# Patient Record
Sex: Male | Born: 2000 | Hispanic: Yes | Marital: Single | Smoking: Former smoker
Health system: Southern US, Community
[De-identification: ages and names within clinical notes are randomized; demographics above are authoritative.]

---
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
## 2021-08-05 ENCOUNTER — Inpatient Hospital Stay (HOSPITAL_COMMUNITY)
Admission: EM | Admit: 2021-08-05 | Discharge: 2021-09-19 | DRG: 003 | Disposition: A | Discharge status: Rehabilitation Facility | Payer: Medicaid Other | Attending: Internal Medicine | Admitting: Internal Medicine

## 2021-08-05 ENCOUNTER — Emergency Department (HOSPITAL_COMMUNITY): Payer: Medicaid Other | Admitting: Anesthesiology

## 2021-08-05 ENCOUNTER — Emergency Department (HOSPITAL_COMMUNITY): Payer: Medicaid Other

## 2021-08-05 ENCOUNTER — Inpatient Hospital Stay (HOSPITAL_COMMUNITY): Payer: Medicaid Other

## 2021-08-05 ENCOUNTER — Inpatient Hospital Stay (HOSPITAL_COMMUNITY)
Admission: EM | Disposition: A | Discharge status: Other Acute Inpatient Hospital | Payer: Self-pay | Source: Home / Self Care

## 2021-08-05 ENCOUNTER — Other Ambulatory Visit (HOSPITAL_COMMUNITY): Payer: Self-pay

## 2021-08-05 ENCOUNTER — Encounter (HOSPITAL_COMMUNITY)
Admission: EM | Disposition: A | Discharge status: Other Acute Inpatient Hospital | Payer: Self-pay | Source: Home / Self Care

## 2021-08-05 DIAGNOSIS — S27322A Contusion of lung, bilateral, initial encounter: Secondary | ICD-10-CM

## 2021-08-05 DIAGNOSIS — S299XXA Unspecified injury of thorax, initial encounter: Secondary | ICD-10-CM | POA: Diagnosis not present

## 2021-08-05 DIAGNOSIS — S73004A Unspecified dislocation of right hip, initial encounter: Secondary | ICD-10-CM

## 2021-08-05 DIAGNOSIS — S271XXA Traumatic hemothorax, initial encounter: Secondary | ICD-10-CM

## 2021-08-05 DIAGNOSIS — J9622 Acute and chronic respiratory failure with hypercapnia: Secondary | ICD-10-CM | POA: Diagnosis not present

## 2021-08-05 DIAGNOSIS — S62241B Displaced fracture of shaft of first metacarpal bone, right hand, initial encounter for open fracture: Secondary | ICD-10-CM

## 2021-08-05 DIAGNOSIS — J690 Pneumonitis due to inhalation of food and vomit: Secondary | ICD-10-CM

## 2021-08-05 DIAGNOSIS — S270XXA Traumatic pneumothorax, initial encounter: Secondary | ICD-10-CM

## 2021-08-05 DIAGNOSIS — J9601 Acute respiratory failure with hypoxia: Secondary | ICD-10-CM

## 2021-08-05 DIAGNOSIS — S225XXA Flail chest, initial encounter for closed fracture: Secondary | ICD-10-CM

## 2021-08-05 DIAGNOSIS — R451 Restlessness and agitation: Secondary | ICD-10-CM

## 2021-08-05 DIAGNOSIS — J80 Acute respiratory distress syndrome: Secondary | ICD-10-CM | POA: Diagnosis present

## 2021-08-05 DIAGNOSIS — S27329A Contusion of lung, unspecified, initial encounter: Secondary | ICD-10-CM | POA: Diagnosis not present

## 2021-08-05 DIAGNOSIS — S32401A Unspecified fracture of right acetabulum, initial encounter for closed fracture: Secondary | ICD-10-CM

## 2021-08-05 DIAGNOSIS — S6291XB Unspecified fracture of right wrist and hand, initial encounter for open fracture: Secondary | ICD-10-CM

## 2021-08-05 DIAGNOSIS — S42352A Displaced comminuted fracture of shaft of humerus, left arm, initial encounter for closed fracture: Secondary | ICD-10-CM

## 2021-08-05 DIAGNOSIS — S27321A Contusion of lung, unilateral, initial encounter: Secondary | ICD-10-CM | POA: Diagnosis present

## 2021-08-05 DIAGNOSIS — T1490XA Injury, unspecified, initial encounter: Principal | ICD-10-CM

## 2021-08-05 DIAGNOSIS — J9621 Acute and chronic respiratory failure with hypoxia: Secondary | ICD-10-CM | POA: Diagnosis present

## 2021-08-05 DIAGNOSIS — I824Y1 Acute embolism and thrombosis of unspecified deep veins of right proximal lower extremity: Secondary | ICD-10-CM

## 2021-08-05 DIAGNOSIS — S81801A Unspecified open wound, right lower leg, initial encounter: Secondary | ICD-10-CM

## 2021-08-05 DIAGNOSIS — S2242XA Multiple fractures of ribs, left side, initial encounter for closed fracture: Secondary | ICD-10-CM

## 2021-08-05 DIAGNOSIS — G9341 Metabolic encephalopathy: Secondary | ICD-10-CM

## 2021-08-05 DIAGNOSIS — I82629 Acute embolism and thrombosis of deep veins of unspecified upper extremity: Secondary | ICD-10-CM

## 2021-08-05 DIAGNOSIS — S42362A Displaced segmental fracture of shaft of humerus, left arm, initial encounter for closed fracture: Secondary | ICD-10-CM

## 2021-08-05 DIAGNOSIS — S32421A Displaced fracture of posterior wall of right acetabulum, initial encounter for closed fracture: Secondary | ICD-10-CM

## 2021-08-05 DIAGNOSIS — T07XXXA Unspecified multiple injuries, initial encounter: Secondary | ICD-10-CM | POA: Diagnosis present

## 2021-08-05 DIAGNOSIS — L8990 Pressure ulcer of unspecified site, unspecified stage: Secondary | ICD-10-CM | POA: Insufficient documentation

## 2021-08-05 DIAGNOSIS — S069XAA Unspecified intracranial injury with loss of consciousness status unknown, initial encounter: Secondary | ICD-10-CM | POA: Diagnosis present

## 2021-08-05 DIAGNOSIS — S52021A Displaced fracture of olecranon process without intraarticular extension of right ulna, initial encounter for closed fracture: Secondary | ICD-10-CM | POA: Diagnosis not present

## 2021-08-05 DIAGNOSIS — G5632 Lesion of radial nerve, left upper limb: Secondary | ICD-10-CM | POA: Diagnosis not present

## 2021-08-05 DIAGNOSIS — G928 Other toxic encephalopathy: Secondary | ICD-10-CM | POA: Diagnosis present

## 2021-08-05 DIAGNOSIS — R402112 Coma scale, eyes open, never, at arrival to emergency department: Secondary | ICD-10-CM | POA: Diagnosis present

## 2021-08-05 DIAGNOSIS — Z20822 Contact with and (suspected) exposure to covid-19: Secondary | ICD-10-CM | POA: Diagnosis present

## 2021-08-05 DIAGNOSIS — S52612A Displaced fracture of left ulna styloid process, initial encounter for closed fracture: Secondary | ICD-10-CM | POA: Diagnosis present

## 2021-08-05 DIAGNOSIS — T794XXA Traumatic shock, initial encounter: Secondary | ICD-10-CM | POA: Diagnosis present

## 2021-08-05 DIAGNOSIS — F05 Delirium due to known physiological condition: Secondary | ICD-10-CM | POA: Diagnosis not present

## 2021-08-05 DIAGNOSIS — G4701 Insomnia due to medical condition: Secondary | ICD-10-CM | POA: Diagnosis not present

## 2021-08-05 DIAGNOSIS — N170 Acute kidney failure with tubular necrosis: Secondary | ICD-10-CM | POA: Diagnosis present

## 2021-08-05 DIAGNOSIS — S42302A Unspecified fracture of shaft of humerus, left arm, initial encounter for closed fracture: Secondary | ICD-10-CM | POA: Diagnosis not present

## 2021-08-05 DIAGNOSIS — D62 Acute posthemorrhagic anemia: Secondary | ICD-10-CM | POA: Diagnosis not present

## 2021-08-05 DIAGNOSIS — S3600XA Unspecified injury of spleen, initial encounter: Secondary | ICD-10-CM | POA: Diagnosis present

## 2021-08-05 DIAGNOSIS — N179 Acute kidney failure, unspecified: Secondary | ICD-10-CM | POA: Diagnosis not present

## 2021-08-05 DIAGNOSIS — S065X9A Traumatic subdural hemorrhage with loss of consciousness of unspecified duration, initial encounter: Secondary | ICD-10-CM | POA: Diagnosis present

## 2021-08-05 DIAGNOSIS — T797XXA Traumatic subcutaneous emphysema, initial encounter: Secondary | ICD-10-CM | POA: Diagnosis present

## 2021-08-05 DIAGNOSIS — S299XXS Unspecified injury of thorax, sequela: Secondary | ICD-10-CM | POA: Diagnosis not present

## 2021-08-05 DIAGNOSIS — F17210 Nicotine dependence, cigarettes, uncomplicated: Secondary | ICD-10-CM | POA: Diagnosis present

## 2021-08-05 DIAGNOSIS — D65 Disseminated intravascular coagulation [defibrination syndrome]: Secondary | ICD-10-CM | POA: Diagnosis not present

## 2021-08-05 DIAGNOSIS — L89896 Pressure-induced deep tissue damage of other site: Secondary | ICD-10-CM | POA: Diagnosis not present

## 2021-08-05 DIAGNOSIS — N289 Disorder of kidney and ureter, unspecified: Secondary | ICD-10-CM | POA: Diagnosis not present

## 2021-08-05 DIAGNOSIS — J156 Pneumonia due to other aerobic Gram-negative bacteria: Secondary | ICD-10-CM | POA: Diagnosis not present

## 2021-08-05 DIAGNOSIS — S52022A Displaced fracture of olecranon process without intraarticular extension of left ulna, initial encounter for closed fracture: Secondary | ICD-10-CM | POA: Diagnosis present

## 2021-08-05 DIAGNOSIS — T8189XA Other complications of procedures, not elsewhere classified, initial encounter: Secondary | ICD-10-CM | POA: Diagnosis not present

## 2021-08-05 DIAGNOSIS — S42301A Unspecified fracture of shaft of humerus, right arm, initial encounter for closed fracture: Secondary | ICD-10-CM | POA: Diagnosis not present

## 2021-08-05 DIAGNOSIS — M7989 Other specified soft tissue disorders: Secondary | ICD-10-CM | POA: Diagnosis not present

## 2021-08-05 DIAGNOSIS — S71101D Unspecified open wound, right thigh, subsequent encounter: Secondary | ICD-10-CM | POA: Diagnosis not present

## 2021-08-05 DIAGNOSIS — Z23 Encounter for immunization: Secondary | ICD-10-CM

## 2021-08-05 DIAGNOSIS — I82451 Acute embolism and thrombosis of right peroneal vein: Secondary | ICD-10-CM | POA: Diagnosis not present

## 2021-08-05 DIAGNOSIS — I82411 Acute embolism and thrombosis of right femoral vein: Secondary | ICD-10-CM | POA: Diagnosis not present

## 2021-08-05 DIAGNOSIS — S300XXA Contusion of lower back and pelvis, initial encounter: Secondary | ICD-10-CM | POA: Diagnosis present

## 2021-08-05 DIAGNOSIS — G479 Sleep disorder, unspecified: Secondary | ICD-10-CM | POA: Diagnosis not present

## 2021-08-05 DIAGNOSIS — D649 Anemia, unspecified: Secondary | ICD-10-CM | POA: Diagnosis not present

## 2021-08-05 DIAGNOSIS — S42402A Unspecified fracture of lower end of left humerus, initial encounter for closed fracture: Secondary | ICD-10-CM | POA: Diagnosis not present

## 2021-08-05 DIAGNOSIS — L89611 Pressure ulcer of right heel, stage 1: Secondary | ICD-10-CM | POA: Diagnosis not present

## 2021-08-05 DIAGNOSIS — Z79899 Other long term (current) drug therapy: Secondary | ICD-10-CM | POA: Diagnosis not present

## 2021-08-05 DIAGNOSIS — Y95 Nosocomial condition: Secondary | ICD-10-CM | POA: Diagnosis not present

## 2021-08-05 DIAGNOSIS — R402312 Coma scale, best motor response, none, at arrival to emergency department: Secondary | ICD-10-CM | POA: Diagnosis present

## 2021-08-05 DIAGNOSIS — I10 Essential (primary) hypertension: Secondary | ICD-10-CM | POA: Diagnosis not present

## 2021-08-05 DIAGNOSIS — R7401 Elevation of levels of liver transaminase levels: Secondary | ICD-10-CM | POA: Diagnosis not present

## 2021-08-05 DIAGNOSIS — G540 Brachial plexus disorders: Secondary | ICD-10-CM | POA: Diagnosis not present

## 2021-08-05 DIAGNOSIS — S76921A Laceration of unspecified muscles, fascia and tendons at thigh level, right thigh, initial encounter: Secondary | ICD-10-CM | POA: Diagnosis present

## 2021-08-05 DIAGNOSIS — S061X9A Traumatic cerebral edema with loss of consciousness of unspecified duration, initial encounter: Secondary | ICD-10-CM | POA: Diagnosis present

## 2021-08-05 DIAGNOSIS — R739 Hyperglycemia, unspecified: Secondary | ICD-10-CM | POA: Diagnosis not present

## 2021-08-05 DIAGNOSIS — S272XXA Traumatic hemopneumothorax, initial encounter: Secondary | ICD-10-CM | POA: Diagnosis present

## 2021-08-05 DIAGNOSIS — J851 Abscess of lung with pneumonia: Secondary | ICD-10-CM | POA: Diagnosis not present

## 2021-08-05 DIAGNOSIS — S069X3S Unspecified intracranial injury with loss of consciousness of 1 hour to 5 hours 59 minutes, sequela: Secondary | ICD-10-CM | POA: Diagnosis not present

## 2021-08-05 DIAGNOSIS — R1312 Dysphagia, oropharyngeal phase: Secondary | ICD-10-CM | POA: Diagnosis not present

## 2021-08-05 DIAGNOSIS — D72829 Elevated white blood cell count, unspecified: Secondary | ICD-10-CM | POA: Diagnosis not present

## 2021-08-05 DIAGNOSIS — S46922A Laceration of unspecified muscle, fascia and tendon at shoulder and upper arm level, left arm, initial encounter: Secondary | ICD-10-CM | POA: Diagnosis present

## 2021-08-05 DIAGNOSIS — E870 Hyperosmolality and hypernatremia: Secondary | ICD-10-CM | POA: Diagnosis not present

## 2021-08-05 DIAGNOSIS — Z7189 Other specified counseling: Secondary | ICD-10-CM | POA: Diagnosis not present

## 2021-08-05 DIAGNOSIS — S73014A Posterior dislocation of right hip, initial encounter: Secondary | ICD-10-CM | POA: Diagnosis present

## 2021-08-05 DIAGNOSIS — S81809A Unspecified open wound, unspecified lower leg, initial encounter: Secondary | ICD-10-CM | POA: Diagnosis not present

## 2021-08-05 DIAGNOSIS — M9689 Other intraoperative and postprocedural complications and disorders of the musculoskeletal system: Secondary | ICD-10-CM | POA: Diagnosis not present

## 2021-08-05 DIAGNOSIS — R402212 Coma scale, best verbal response, none, at arrival to emergency department: Secondary | ICD-10-CM | POA: Diagnosis present

## 2021-08-05 DIAGNOSIS — R4182 Altered mental status, unspecified: Secondary | ICD-10-CM | POA: Diagnosis present

## 2021-08-05 DIAGNOSIS — R4689 Other symptoms and signs involving appearance and behavior: Secondary | ICD-10-CM | POA: Diagnosis not present

## 2021-08-05 DIAGNOSIS — S62501B Fracture of unspecified phalanx of right thumb, initial encounter for open fracture: Secondary | ICD-10-CM | POA: Diagnosis present

## 2021-08-05 DIAGNOSIS — S32422A Displaced fracture of posterior wall of left acetabulum, initial encounter for closed fracture: Secondary | ICD-10-CM | POA: Diagnosis present

## 2021-08-05 DIAGNOSIS — S143XXA Injury of brachial plexus, initial encounter: Secondary | ICD-10-CM | POA: Diagnosis present

## 2021-08-05 DIAGNOSIS — R52 Pain, unspecified: Secondary | ICD-10-CM | POA: Diagnosis not present

## 2021-08-05 DIAGNOSIS — S32424A Nondisplaced fracture of posterior wall of right acetabulum, initial encounter for closed fracture: Secondary | ICD-10-CM | POA: Diagnosis not present

## 2021-08-05 DIAGNOSIS — S62201B Unspecified fracture of first metacarpal bone, right hand, initial encounter for open fracture: Secondary | ICD-10-CM | POA: Diagnosis present

## 2021-08-05 DIAGNOSIS — S71101A Unspecified open wound, right thigh, initial encounter: Secondary | ICD-10-CM | POA: Diagnosis not present

## 2021-08-05 DIAGNOSIS — Z9911 Dependence on respirator [ventilator] status: Secondary | ICD-10-CM | POA: Diagnosis not present

## 2021-08-05 DIAGNOSIS — R45851 Suicidal ideations: Secondary | ICD-10-CM | POA: Diagnosis present

## 2021-08-05 DIAGNOSIS — S71001D Unspecified open wound, right hip, subsequent encounter: Secondary | ICD-10-CM | POA: Diagnosis not present

## 2021-08-05 DIAGNOSIS — S0636AS Traumatic hemorrhage of cerebrum, unspecified, with loss of consciousness status unknown, sequela: Secondary | ICD-10-CM | POA: Diagnosis not present

## 2021-08-05 DIAGNOSIS — J9584 Transfusion-related acute lung injury (TRALI): Secondary | ICD-10-CM | POA: Diagnosis not present

## 2021-08-05 DIAGNOSIS — S069XAD Unspecified intracranial injury with loss of consciousness status unknown, subsequent encounter: Secondary | ICD-10-CM | POA: Diagnosis not present

## 2021-08-05 DIAGNOSIS — S2020XA Contusion of thorax, unspecified, initial encounter: Secondary | ICD-10-CM | POA: Diagnosis not present

## 2021-08-05 DIAGNOSIS — S069X0A Unspecified intracranial injury without loss of consciousness, initial encounter: Secondary | ICD-10-CM | POA: Diagnosis not present

## 2021-08-05 DIAGNOSIS — S06301A Unspecified focal traumatic brain injury with loss of consciousness of 30 minutes or less, initial encounter: Secondary | ICD-10-CM | POA: Diagnosis not present

## 2021-08-05 DIAGNOSIS — S71109A Unspecified open wound, unspecified thigh, initial encounter: Secondary | ICD-10-CM | POA: Diagnosis not present

## 2021-08-05 DIAGNOSIS — S065X9D Traumatic subdural hemorrhage with loss of consciousness of unspecified duration, subsequent encounter: Secondary | ICD-10-CM | POA: Diagnosis not present

## 2021-08-05 DIAGNOSIS — E871 Hypo-osmolality and hyponatremia: Secondary | ICD-10-CM | POA: Diagnosis not present

## 2021-08-05 DIAGNOSIS — R509 Fever, unspecified: Secondary | ICD-10-CM | POA: Diagnosis not present

## 2021-08-05 DIAGNOSIS — S79921A Unspecified injury of right thigh, initial encounter: Secondary | ICD-10-CM | POA: Diagnosis not present

## 2021-08-05 DIAGNOSIS — R464 Slowness and poor responsiveness: Secondary | ICD-10-CM | POA: Diagnosis not present

## 2021-08-05 DIAGNOSIS — R609 Edema, unspecified: Secondary | ICD-10-CM | POA: Diagnosis not present

## 2021-08-05 DIAGNOSIS — M21371 Foot drop, right foot: Secondary | ICD-10-CM | POA: Diagnosis not present

## 2021-08-05 DIAGNOSIS — R41840 Attention and concentration deficit: Secondary | ICD-10-CM | POA: Diagnosis not present

## 2021-08-05 DIAGNOSIS — S069XAS Unspecified intracranial injury with loss of consciousness status unknown, sequela: Secondary | ICD-10-CM | POA: Diagnosis not present

## 2021-08-05 DIAGNOSIS — K5903 Drug induced constipation: Secondary | ICD-10-CM | POA: Diagnosis not present

## 2021-08-05 DIAGNOSIS — R042 Hemoptysis: Secondary | ICD-10-CM | POA: Diagnosis not present

## 2021-08-05 DIAGNOSIS — S73004S Unspecified dislocation of right hip, sequela: Secondary | ICD-10-CM | POA: Diagnosis not present

## 2021-08-05 DIAGNOSIS — M79605 Pain in left leg: Secondary | ICD-10-CM | POA: Diagnosis not present

## 2021-08-05 DIAGNOSIS — I82611 Acute embolism and thrombosis of superficial veins of right upper extremity: Secondary | ICD-10-CM | POA: Diagnosis not present

## 2021-08-05 DIAGNOSIS — Z781 Physical restraint status: Secondary | ICD-10-CM

## 2021-08-05 DIAGNOSIS — Z945 Skin transplant status: Secondary | ICD-10-CM | POA: Diagnosis not present

## 2021-08-05 DIAGNOSIS — S069X0D Unspecified intracranial injury without loss of consciousness, subsequent encounter: Secondary | ICD-10-CM | POA: Diagnosis not present

## 2021-08-05 DIAGNOSIS — Z930 Tracheostomy status: Secondary | ICD-10-CM | POA: Diagnosis not present

## 2021-08-05 DIAGNOSIS — R5081 Fever presenting with conditions classified elsewhere: Secondary | ICD-10-CM | POA: Diagnosis not present

## 2021-08-05 DIAGNOSIS — J939 Pneumothorax, unspecified: Secondary | ICD-10-CM | POA: Diagnosis not present

## 2021-08-05 DIAGNOSIS — S069X9D Unspecified intracranial injury with loss of consciousness of unspecified duration, subsequent encounter: Secondary | ICD-10-CM | POA: Diagnosis not present

## 2021-08-05 DIAGNOSIS — S069X0S Unspecified intracranial injury without loss of consciousness, sequela: Secondary | ICD-10-CM | POA: Diagnosis not present

## 2021-08-05 DIAGNOSIS — M79604 Pain in right leg: Secondary | ICD-10-CM | POA: Diagnosis not present

## 2021-08-05 DIAGNOSIS — J9602 Acute respiratory failure with hypercapnia: Secondary | ICD-10-CM | POA: Diagnosis not present

## 2021-08-05 DIAGNOSIS — K5900 Constipation, unspecified: Secondary | ICD-10-CM | POA: Diagnosis not present

## 2021-08-05 DIAGNOSIS — E876 Hypokalemia: Secondary | ICD-10-CM | POA: Diagnosis not present

## 2021-08-05 DIAGNOSIS — S62201D Unspecified fracture of first metacarpal bone, right hand, subsequent encounter for fracture with routine healing: Secondary | ICD-10-CM | POA: Diagnosis not present

## 2021-08-05 DIAGNOSIS — G249 Dystonia, unspecified: Secondary | ICD-10-CM | POA: Diagnosis not present

## 2021-08-05 DIAGNOSIS — S2242XD Multiple fractures of ribs, left side, subsequent encounter for fracture with routine healing: Secondary | ICD-10-CM | POA: Diagnosis not present

## 2021-08-05 DIAGNOSIS — S069X3D Unspecified intracranial injury with loss of consciousness of 1 hour to 5 hours 59 minutes, subsequent encounter: Secondary | ICD-10-CM | POA: Diagnosis not present

## 2021-08-05 DIAGNOSIS — Z9281 Personal history of extracorporeal membrane oxygenation (ECMO): Secondary | ICD-10-CM | POA: Diagnosis not present

## 2021-08-05 HISTORY — PX: ECMO CANNULATION: CATH118321

## 2021-08-05 HISTORY — PX: ARTERIAL LINE INSERTION: CATH118227

## 2021-08-05 LAB — COMPREHENSIVE METABOLIC PANEL
ALT: 105 U/L — ABNORMAL HIGH (ref 0–44)
AST: 118 U/L — ABNORMAL HIGH (ref 15–41)
Albumin: 3 g/dL — ABNORMAL LOW (ref 3.5–5.0)
Alkaline Phosphatase: 61 U/L (ref 38–126)
Anion gap: 13 (ref 5–15)
BUN: 17 mg/dL (ref 6–20)
CO2: 27 mmol/L (ref 22–32)
Calcium: 7.1 mg/dL — ABNORMAL LOW (ref 8.9–10.3)
Chloride: 96 mmol/L — ABNORMAL LOW (ref 98–111)
Creatinine, Ser: 1.82 mg/dL — ABNORMAL HIGH (ref 0.61–1.24)
GFR, Estimated: 54 mL/min — ABNORMAL LOW (ref >60)
Glucose, Bld: 322 mg/dL — ABNORMAL HIGH (ref 70–99)
Potassium: 4.7 mmol/L (ref 3.5–5.1)
Sodium: 136 mmol/L (ref 135–145)
Total Bilirubin: 0.9 mg/dL (ref 0.3–1.2)
Total Protein: 5.1 g/dL — ABNORMAL LOW (ref 6.5–8.1)

## 2021-08-05 LAB — POCT I-STAT 7, (LYTES, BLD GAS, ICA,H+H)
Acid-Base Excess: 2 mmol/L (ref 0.0–2.0)
Acid-Base Excess: 3 mmol/L — ABNORMAL HIGH (ref 0.0–2.0)
Acid-Base Excess: 3 mmol/L — ABNORMAL HIGH (ref 0.0–2.0)
Bicarbonate: 26.6 mmol/L (ref 20.0–28.0)
Bicarbonate: 27.6 mmol/L (ref 20.0–28.0)
Bicarbonate: 29.2 mmol/L — ABNORMAL HIGH (ref 20.0–28.0)
Calcium, Ion: 0.91 mmol/L — ABNORMAL LOW (ref 1.15–1.40)
Calcium, Ion: 0.93 mmol/L — ABNORMAL LOW (ref 1.15–1.40)
Calcium, Ion: 1.02 mmol/L — ABNORMAL LOW (ref 1.15–1.40)
HCT: 21 % — ABNORMAL LOW (ref 39.0–52.0)
HCT: 25 % — ABNORMAL LOW (ref 39.0–52.0)
HCT: 30 % — ABNORMAL LOW (ref 39.0–52.0)
Hemoglobin: 10.2 g/dL — ABNORMAL LOW (ref 13.0–17.0)
Hemoglobin: 7.1 g/dL — ABNORMAL LOW (ref 13.0–17.0)
Hemoglobin: 8.5 g/dL — ABNORMAL LOW (ref 13.0–17.0)
O2 Saturation: 100 %
O2 Saturation: 100 %
O2 Saturation: 89 %
Patient temperature: 36.9
Patient temperature: 37.9
Potassium: 3.1 mmol/L — ABNORMAL LOW (ref 3.5–5.1)
Potassium: 3.2 mmol/L — ABNORMAL LOW (ref 3.5–5.1)
Potassium: 3.3 mmol/L — ABNORMAL LOW (ref 3.5–5.1)
Sodium: 143 mmol/L (ref 135–145)
Sodium: 144 mmol/L (ref 135–145)
Sodium: 145 mmol/L (ref 135–145)
TCO2: 28 mmol/L (ref 22–32)
TCO2: 29 mmol/L (ref 22–32)
TCO2: 31 mmol/L (ref 22–32)
pCO2 arterial: 35.7 mmHg (ref 32–48)
pCO2 arterial: 46 mmHg (ref 32–48)
pCO2 arterial: 54.2 mmHg — ABNORMAL HIGH (ref 32–48)
pH, Arterial: 7.343 — ABNORMAL LOW (ref 7.35–7.45)
pH, Arterial: 7.386 (ref 7.35–7.45)
pH, Arterial: 7.481 — ABNORMAL HIGH (ref 7.35–7.45)
pO2, Arterial: 181 mmHg — ABNORMAL HIGH (ref 83–108)
pO2, Arterial: 320 mmHg — ABNORMAL HIGH (ref 83–108)
pO2, Arterial: 65 mmHg — ABNORMAL LOW (ref 83–108)

## 2021-08-05 LAB — CBC
HCT: 34.9 % — ABNORMAL LOW (ref 39.0–52.0)
Hemoglobin: 12 g/dL — ABNORMAL LOW (ref 13.0–17.0)
MCH: 30.8 pg (ref 26.0–34.0)
MCHC: 34.4 g/dL (ref 30.0–36.0)
MCV: 89.7 fL (ref 80.0–100.0)
Platelets: 158 10*3/uL (ref 150–400)
RBC: 3.89 MIL/uL — ABNORMAL LOW (ref 4.22–5.81)
RDW: 13.9 % (ref 11.5–15.5)
WBC: 10.2 10*3/uL (ref 4.0–10.5)
nRBC: 0 % (ref 0.0–0.2)

## 2021-08-05 LAB — I-STAT ARTERIAL BLOOD GAS, ED
Acid-Base Excess: 3 mmol/L — ABNORMAL HIGH (ref 0.0–2.0)
Bicarbonate: 29.6 mmol/L — ABNORMAL HIGH (ref 20.0–28.0)
Calcium, Ion: 0.99 mmol/L — ABNORMAL LOW (ref 1.15–1.40)
HCT: 33 % — ABNORMAL LOW (ref 39.0–52.0)
Hemoglobin: 11.2 g/dL — ABNORMAL LOW (ref 13.0–17.0)
O2 Saturation: 90 %
Patient temperature: 103.8
Potassium: 3.9 mmol/L (ref 3.5–5.1)
Sodium: 141 mmol/L (ref 135–145)
TCO2: 31 mmol/L (ref 22–32)
pCO2 arterial: 64.2 mmHg — ABNORMAL HIGH (ref 32–48)
pH, Arterial: 7.285 — ABNORMAL LOW (ref 7.35–7.45)
pO2, Arterial: 78 mmHg — ABNORMAL LOW (ref 83–108)

## 2021-08-05 LAB — LACTIC ACID, PLASMA: Lactic Acid, Venous: 5.3 mmol/L (ref 0.5–1.9)

## 2021-08-05 LAB — TRAUMA TEG PANEL
CFF Max Amplitude: 18.9 mm (ref 15–32)
Citrated Kaolin (R): 6.1 min (ref 4.6–9.1)
Citrated Rapid TEG (MA): 57.7 mm (ref 52–70)
Lysis at 30 Minutes: 0 % (ref 0.0–2.6)

## 2021-08-05 LAB — PREPARE RBC (CROSSMATCH)

## 2021-08-05 LAB — ABO/RH: ABO/RH(D): A POS

## 2021-08-05 LAB — PROTIME-INR
INR: 1.4 — ABNORMAL HIGH (ref 0.8–1.2)
Prothrombin Time: 16.9 seconds — ABNORMAL HIGH (ref 11.4–15.2)

## 2021-08-05 LAB — POCT ACTIVATED CLOTTING TIME
Activated Clotting Time: 131 seconds
Activated Clotting Time: 305 seconds
Activated Clotting Time: 317 seconds

## 2021-08-05 LAB — ETHANOL: Alcohol, Ethyl (B): <10 mg/dL (ref <10)

## 2021-08-05 SURGERY — ECMO CANNULATION
Anesthesia: LOCAL

## 2021-08-05 SURGERY — INSERTION, CANNULA, FOR ECMO, AT BEDSIDE
Anesthesia: General

## 2021-08-05 SURGERY — ECMO CANNULATION
Anesthesia: General

## 2021-08-05 MED ORDER — FENTANYL BOLUS VIA INFUSION: 50.0000 ug | INTRAVENOUS | Status: DC | PRN

## 2021-08-05 MED ORDER — ROCURONIUM 10MG/ML (10ML) SYRINGE FOR MEDFUSION PUMP - OPTIME
INTRAVENOUS | Status: DC | PRN
  Administered 2021-08-05: 100 mg via INTRAVENOUS

## 2021-08-05 MED ORDER — HEPARIN (PORCINE) IN NACL 2000-0.9 UNIT/L-% IV SOLN
INTRAVENOUS | Status: AC
  Filled 2021-08-05: qty 2000

## 2021-08-05 MED ORDER — FENTANYL CITRATE PF 50 MCG/ML IJ SOSY: 50.0000 ug | PREFILLED_SYRINGE | Freq: Once | INTRAMUSCULAR | Status: AC

## 2021-08-05 MED ORDER — MIDAZOLAM HCL 2 MG/2ML IJ SOLN: 2.0000 mg | INTRAMUSCULAR | Status: DC | PRN

## 2021-08-05 MED ORDER — ALBUMIN HUMAN 5 % IV SOLN
12.5000 g | INTRAVENOUS | Status: AC | PRN
  Administered 2021-08-06 (×4): 12.5 g via INTRAVENOUS
  Filled 2021-08-05 (×4): qty 250

## 2021-08-05 MED ORDER — NOREPINEPHRINE 4 MG/250ML-% IV SOLN
INTRAVENOUS | Status: AC
  Filled 2021-08-05: qty 250

## 2021-08-05 MED ORDER — BISACODYL 10 MG RE SUPP: 10.0000 mg | Freq: Every day | RECTAL | Status: DC | PRN

## 2021-08-05 MED ORDER — VECURONIUM BROMIDE 10 MG IV SOLR
10.0000 mg | Freq: Once | INTRAVENOUS | Status: DC
  Filled 2021-08-05: qty 10

## 2021-08-05 MED ORDER — DOCUSATE SODIUM 100 MG PO CAPS: 100.0000 mg | ORAL_CAPSULE | Freq: Two times a day (BID) | ORAL | Status: DC

## 2021-08-05 MED ORDER — VANCOMYCIN VARIABLE DOSE PER UNSTABLE RENAL FUNCTION (PHARMACIST DOSING): Status: DC

## 2021-08-05 MED ORDER — TETANUS-DIPHTHERIA TOXOIDS TD 5-2 LFU IM INJ
0.5000 mL | INJECTION | Freq: Once | INTRAMUSCULAR | Status: DC
  Filled 2021-08-05: qty 0.5

## 2021-08-05 MED ORDER — ORAL CARE MOUTH RINSE: 15.0000 mL | OROMUCOSAL | Status: DC | PRN

## 2021-08-05 MED ORDER — PANTOPRAZOLE SODIUM 40 MG PO TBEC
40.0000 mg | DELAYED_RELEASE_TABLET | Freq: Every day | ORAL | Status: DC
  Filled 2021-08-05: qty 1

## 2021-08-05 MED ORDER — ETOMIDATE 2 MG/ML IV SOLN
INTRAVENOUS | Status: DC | PRN
  Administered 2021-08-05: 20 mg via INTRAVENOUS

## 2021-08-05 MED ORDER — HYDROMORPHONE BOLUS VIA INFUSION
0.2500 mg | INTRAVENOUS | Status: DC | PRN
  Administered 2021-08-05 – 2021-08-10 (×7): 2 mg via INTRAVENOUS
  Administered 2021-08-10: 1.5 mg via INTRAVENOUS
  Administered 2021-08-10 (×2): 2 mg via INTRAVENOUS
  Administered 2021-08-10: 1 mg via INTRAVENOUS
  Administered 2021-08-10 (×2): 2 mg via INTRAVENOUS
  Administered 2021-08-10: 1 mg via INTRAVENOUS
  Administered 2021-08-10 – 2021-08-18 (×23): 2 mg via INTRAVENOUS
  Administered 2021-08-19 (×2): 1 mg via INTRAVENOUS
  Administered 2021-08-19 (×7): 2 mg via INTRAVENOUS
  Administered 2021-08-20 (×2): 1 mg via INTRAVENOUS
  Administered 2021-08-20 (×4): 2 mg via INTRAVENOUS

## 2021-08-05 MED ORDER — PROPOFOL 500 MG/50ML IV EMUL
INTRAVENOUS | Status: DC | PRN
  Administered 2021-08-05: 30 ug/kg/min via INTRAVENOUS

## 2021-08-05 MED ORDER — FENTANYL 2500MCG IN NS 250ML (10MCG/ML) PREMIX INFUSION: 50.0000 ug/h | INTRAVENOUS | Status: DC

## 2021-08-05 MED ORDER — SODIUM CHLORIDE 0.9 % IV SOLN
3.0000 g | Freq: Four times a day (QID) | INTRAVENOUS | Status: DC
  Administered 2021-08-05: 3 g via INTRAVENOUS
  Filled 2021-08-05: qty 8

## 2021-08-05 MED ORDER — LIDOCAINE HCL (PF) 1 % IJ SOLN
INTRAMUSCULAR | Status: DC | PRN
  Administered 2021-08-05: 10 mL

## 2021-08-05 MED ORDER — TRANEXAMIC ACID 1000 MG/10ML IV SOLN
1000.0000 mg | Freq: Once | INTRAVENOUS | Status: AC
  Administered 2021-08-05: 1000 mg via INTRAVENOUS
  Filled 2021-08-05: qty 10

## 2021-08-05 MED ORDER — FENTANYL CITRATE (PF) 100 MCG/2ML IJ SOLN
INTRAMUSCULAR | Status: AC | PRN
  Administered 2021-08-05: 50 ug via INTRAVENOUS

## 2021-08-05 MED ORDER — PROPOFOL 1000 MG/100ML IV EMUL: 0.0000 ug/kg/min | INTRAVENOUS | Status: DC

## 2021-08-05 MED ORDER — DEXTROSE 5 % IV SOLN: INTRAVENOUS | Status: DC | PRN

## 2021-08-05 MED ORDER — IOHEXOL 350 MG/ML SOLN
100.0000 mL | Freq: Once | INTRAVENOUS | Status: AC | PRN
  Administered 2021-08-05: 100 mL via INTRAVENOUS

## 2021-08-05 MED ORDER — ONDANSETRON HCL 4 MG/2ML IJ SOLN
4.0000 mg | Freq: Four times a day (QID) | INTRAMUSCULAR | Status: DC | PRN
  Administered 2021-09-04: 4 mg via INTRAVENOUS
  Filled 2021-08-05: qty 2

## 2021-08-05 MED ORDER — VASOPRESSIN 20 UNITS/100 ML INFUSION FOR SHOCK
0.0000 [IU]/min | INTRAVENOUS | Status: DC
  Administered 2021-08-06 – 2021-08-07 (×5): 0.03 [IU]/min via INTRAVENOUS
  Filled 2021-08-05 (×4): qty 100

## 2021-08-05 MED ORDER — FENTANYL 2500MCG IN NS 250ML (10MCG/ML) PREMIX INFUSION
50.0000 ug/h | INTRAVENOUS | Status: DC
  Administered 2021-08-05: 50 ug/h via INTRAVENOUS
  Filled 2021-08-05: qty 250

## 2021-08-05 MED ORDER — FENTANYL CITRATE (PF) 100 MCG/2ML IJ SOLN: 50.0000 ug | Freq: Once | INTRAMUSCULAR | Status: DC

## 2021-08-05 MED ORDER — FENTANYL CITRATE PF 50 MCG/ML IJ SOSY: 50.0000 ug | PREFILLED_SYRINGE | Freq: Once | INTRAMUSCULAR | Status: DC

## 2021-08-05 MED ORDER — ORAL CARE MOUTH RINSE
15.0000 mL | OROMUCOSAL | Status: DC
  Administered 2021-08-06 – 2021-08-07 (×17): 15 mL via OROMUCOSAL

## 2021-08-05 MED ORDER — SUCCINYLCHOLINE CHLORIDE 20 MG/ML IJ SOLN
INTRAMUSCULAR | Status: DC | PRN
  Administered 2021-08-05: 120 mg via INTRAVENOUS

## 2021-08-05 MED ORDER — HEPARIN SODIUM (PORCINE) 1000 UNIT/ML IJ SOLN
INTRAMUSCULAR | Status: AC
  Filled 2021-08-05: qty 30

## 2021-08-05 MED ORDER — HEPARIN SODIUM (PORCINE) 1000 UNIT/ML IJ SOLN
INTRAMUSCULAR | Status: DC | PRN
  Administered 2021-08-05: 10000 [IU] via INTRAVENOUS

## 2021-08-05 MED ORDER — ALBUMIN HUMAN 5 % IV SOLN: INTRAVENOUS | Status: DC | PRN

## 2021-08-05 MED ORDER — MIDAZOLAM HCL 2 MG/2ML IJ SOLN
2.0000 mg | INTRAMUSCULAR | Status: AC | PRN
  Administered 2021-08-08 – 2021-08-10 (×3): 2 mg via INTRAVENOUS
  Filled 2021-08-05 (×2): qty 2

## 2021-08-05 MED ORDER — VANCOMYCIN HCL 750 MG/150ML IV SOLN
750.0000 mg | Freq: Two times a day (BID) | INTRAVENOUS | Status: DC
  Filled 2021-08-05: qty 150

## 2021-08-05 MED ORDER — CLONAZEPAM 1 MG PO TABS
2.0000 mg | ORAL_TABLET | Freq: Two times a day (BID) | ORAL | Status: DC
  Administered 2021-08-06 – 2021-08-07 (×4): 2 mg
  Filled 2021-08-05 (×4): qty 2

## 2021-08-05 MED ORDER — SODIUM CHLORIDE 0.9 % IV SOLN
0.5000 mg/h | INTRAVENOUS | Status: DC
  Administered 2021-08-05: 1 mg/h via INTRAVENOUS
  Administered 2021-08-06: 4 mg/h via INTRAVENOUS
  Administered 2021-08-07: 2 mg/h via INTRAVENOUS
  Administered 2021-08-07 – 2021-08-08 (×2): 4 mg/h via INTRAVENOUS
  Administered 2021-08-09 (×2): 2 mg/h via INTRAVENOUS
  Administered 2021-08-10 – 2021-08-11 (×5): 4 mg/h via INTRAVENOUS
  Filled 2021-08-05 (×19): qty 5

## 2021-08-05 MED ORDER — VECURONIUM BROMIDE 10 MG IV SOLR
9.0000 mg | Freq: Once | INTRAVENOUS | Status: AC
  Administered 2021-08-05: 9 mg via INTRAVENOUS

## 2021-08-05 MED ORDER — FENTANYL CITRATE PF 50 MCG/ML IJ SOSY
50.0000 ug | PREFILLED_SYRINGE | Freq: Once | INTRAMUSCULAR | Status: AC
  Administered 2021-08-05: 25 ug via INTRAVENOUS

## 2021-08-05 MED ORDER — PANTOPRAZOLE SODIUM 40 MG IV SOLR
40.0000 mg | Freq: Every day | INTRAVENOUS | Status: DC
  Administered 2021-08-06 – 2021-08-09 (×4): 40 mg via INTRAVENOUS
  Filled 2021-08-05 (×4): qty 10

## 2021-08-05 MED ORDER — CLONAZEPAM 1 MG PO TABS: 2.0000 mg | ORAL_TABLET | Freq: Two times a day (BID) | ORAL | Status: DC

## 2021-08-05 MED ORDER — SODIUM CHLORIDE 0.9 % IV SOLN: INTRAVENOUS | Status: DC | PRN

## 2021-08-05 MED ORDER — PROPOFOL 10 MG/ML IV BOLUS
INTRAVENOUS | Status: DC | PRN
  Administered 2021-08-05: 150 mg via INTRAVENOUS

## 2021-08-05 MED ORDER — MIDAZOLAM HCL 5 MG/5ML IJ SOLN
INTRAMUSCULAR | Status: AC
  Filled 2021-08-05: qty 5

## 2021-08-05 MED ORDER — ONDANSETRON HCL 4 MG PO TABS: 4.0000 mg | ORAL_TABLET | Freq: Four times a day (QID) | ORAL | Status: DC | PRN

## 2021-08-05 MED ORDER — LIDOCAINE HCL (PF) 1 % IJ SOLN
INTRAMUSCULAR | Status: AC
  Filled 2021-08-05: qty 30

## 2021-08-05 MED ORDER — MIDAZOLAM HCL 2 MG/2ML IJ SOLN
2.0000 mg | INTRAMUSCULAR | Status: DC | PRN
  Administered 2021-08-06 – 2021-08-20 (×15): 2 mg via INTRAVENOUS
  Administered 2021-08-20: 1 mg via INTRAVENOUS
  Administered 2021-08-20 – 2021-08-21 (×2): 2 mg via INTRAVENOUS
  Filled 2021-08-05 (×17): qty 2

## 2021-08-05 MED ORDER — TRANEXAMIC ACID-NACL 1000-0.7 MG/100ML-% IV SOLN
1000.0000 mg | Freq: Once | INTRAVENOUS | Status: AC
  Administered 2021-08-05: 1000 mg via INTRAVENOUS

## 2021-08-05 MED ORDER — POLYETHYLENE GLYCOL 3350 17 G PO PACK
17.0000 g | PACK | Freq: Every day | ORAL | Status: DC
  Administered 2021-08-07 – 2021-08-11 (×5): 17 g
  Filled 2021-08-05 (×5): qty 1

## 2021-08-05 MED ORDER — HEPARIN (PORCINE) IN NACL 1000-0.9 UT/500ML-% IV SOLN
INTRAVENOUS | Status: DC | PRN
  Administered 2021-08-05 (×2): 500 mL

## 2021-08-05 MED ORDER — PHENYLEPHRINE HCL-NACL 20-0.9 MG/250ML-% IV SOLN
INTRAVENOUS | Status: DC | PRN
  Administered 2021-08-05: 40 ug/min via INTRAVENOUS

## 2021-08-05 MED ORDER — HYDROMORPHONE HCL 1 MG/ML IJ SOLN: 1.0000 mg | Freq: Once | INTRAMUSCULAR | Status: DC

## 2021-08-05 MED ORDER — OXYCODONE HCL 5 MG/5ML PO SOLN
10.0000 mg | Freq: Four times a day (QID) | ORAL | Status: DC | PRN
  Administered 2021-08-07: 10 mg
  Filled 2021-08-05: qty 10

## 2021-08-05 MED ORDER — LACTATED RINGERS IV SOLN: INTRAVENOUS | Status: DC | PRN

## 2021-08-05 MED ORDER — TETANUS-DIPHTH-ACELL PERTUSSIS 5-2.5-18.5 LF-MCG/0.5 IM SUSY
0.5000 mL | PREFILLED_SYRINGE | Freq: Once | INTRAMUSCULAR | Status: DC
  Administered 2021-08-05: 0.5 mL via INTRAMUSCULAR

## 2021-08-05 MED ORDER — VANCOMYCIN HCL 750 MG/150ML IV SOLN
750.0000 mg | Freq: Once | INTRAVENOUS | Status: AC
  Administered 2021-08-06: 750 mg via INTRAVENOUS
  Filled 2021-08-05: qty 150

## 2021-08-05 MED ORDER — FENTANYL CITRATE PF 50 MCG/ML IJ SOSY
PREFILLED_SYRINGE | INTRAMUSCULAR | Status: AC
  Filled 2021-08-05: qty 1

## 2021-08-05 MED ORDER — PHENYLEPHRINE HCL (PRESSORS) 10 MG/ML IV SOLN
INTRAVENOUS | Status: DC | PRN
  Administered 2021-08-05 (×3): 80 ug via INTRAVENOUS

## 2021-08-05 MED ORDER — CEFAZOLIN SODIUM-DEXTROSE 2-4 GM/100ML-% IV SOLN
2.0000 g | INTRAVENOUS | Status: AC
Start: 2021-08-05 — End: 2021-08-05
  Administered 2021-08-05: 2 g via INTRAVENOUS
  Filled 2021-08-05: qty 100

## 2021-08-05 MED ORDER — PANTOPRAZOLE SODIUM 40 MG IV SOLR: 40.0000 mg | Freq: Every day | INTRAVENOUS | Status: DC

## 2021-08-05 MED ORDER — SODIUM CHLORIDE 0.9 % IV SOLN
1.0000 g | Freq: Three times a day (TID) | INTRAVENOUS | Status: DC
  Administered 2021-08-06 – 2021-08-17 (×34): 1 g via INTRAVENOUS
  Filled 2021-08-05 (×35): qty 20

## 2021-08-05 MED ORDER — FENTANYL CITRATE PF 50 MCG/ML IJ SOSY
PREFILLED_SYRINGE | INTRAMUSCULAR | Status: AC
  Administered 2021-08-05: 50 ug via INTRAVENOUS
  Filled 2021-08-05: qty 1

## 2021-08-05 MED ORDER — LEVETIRACETAM IN NACL 500 MG/100ML IV SOLN
500.0000 mg | Freq: Two times a day (BID) | INTRAVENOUS | Status: AC
  Administered 2021-08-06 – 2021-08-11 (×13): 500 mg via INTRAVENOUS
  Filled 2021-08-05 (×14): qty 100

## 2021-08-05 MED ORDER — DOCUSATE SODIUM 50 MG/5ML PO LIQD
100.0000 mg | Freq: Two times a day (BID) | ORAL | Status: DC
  Administered 2021-08-06 – 2021-08-12 (×12): 100 mg
  Filled 2021-08-05 (×13): qty 10

## 2021-08-05 MED ORDER — ROCURONIUM BROMIDE 10 MG/ML (PF) SYRINGE
1.0000 mg/kg | PREFILLED_SYRINGE | INTRAVENOUS | Status: DC | PRN
  Administered 2021-08-11 – 2021-08-13 (×4): 68 mg via INTRAVENOUS
  Filled 2021-08-05 (×4): qty 10

## 2021-08-05 MED ORDER — VANCOMYCIN HCL IN DEXTROSE 1-5 GM/200ML-% IV SOLN
1000.0000 mg | Freq: Once | INTRAVENOUS | Status: AC
Start: 2021-08-05 — End: 2021-08-05
  Administered 2021-08-05: 1000 mg via INTRAVENOUS

## 2021-08-05 MED ORDER — MIDAZOLAM HCL 2 MG/2ML IJ SOLN
INTRAMUSCULAR | Status: DC | PRN
  Administered 2021-08-05: 1 mg via INTRAVENOUS
  Administered 2021-08-05 (×2): 2 mg via INTRAVENOUS

## 2021-08-05 SURGICAL SUPPLY — 11 items
BAG SNAP BAND KOVER 36X36 (MISCELLANEOUS) ×1 IMPLANT
CATH DUAL LUMEN CRESCENT 30FR (CATHETERS) ×1 IMPLANT
ELECT DEFIB PAD ADLT CADENCE (PAD) ×1 IMPLANT
KIT DILATOR VASC 18G NDL (KITS) ×3 IMPLANT
KIT VASCULAR ACCESS OPUS (MISCELLANEOUS) ×1 IMPLANT
MAT PREVALON FULL STRYKER (MISCELLANEOUS) ×1 IMPLANT
PACK CARDIAC CATHETERIZATION (CUSTOM PROCEDURE TRAY) ×1 IMPLANT
SHEATH PINNACLE 7F 10CM (SHEATH) ×1 IMPLANT
SHEATH PINNACLE 8F 10CM (SHEATH) ×1 IMPLANT
SHEATH PROBE COVER 6X72 (BAG) ×1 IMPLANT
WIRE AMPLATZ SS-J .035X180CM (WIRE) ×1 IMPLANT

## 2021-08-05 NOTE — ED Notes (Signed)
Patient transported to CT 
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
## 2021-08-05 NOTE — Progress Notes (Signed)
   08/05/21 1955  Clinical Encounter Type  Visited With Family;Patient not available  Visit Type Critical Care;Follow-up;Spiritual support (Code STEMI)  Referral From Nurse  Consult/Referral To Chaplain  Stress Factors  Family Stress Factors  (Anxious, and afraid of the unknown)   Chaplain Tery Sanfilippo responded to the page. The patient is being attended to by the medical team. This Chaplain greeted family in consultation rm A. The patient's father and two sisters were present and appeared tearful as the attending MD provided the update. Chaplain actively listened as the family spoke of their concerns about the patient's condition. Donnajean Lopes provided emotional support, calming presence and prayer offered. Family said they had no additional needs. This chaplain remains available for follow up spiritual/emotional support as needed. This note was prepared by Deneen Harts, M.Div..  For questions please contact by phone [phone].

## 2021-08-05 NOTE — Progress Notes (Incomplete)
Patient arrived in ICU on ECMO, mechanical ventilation.  ECMO flow increased to 4 L/min.  Sweep increased when ventilator changed to rest settings.  Pressure support 12, PEEP 12, respiratory rate 15, FiO2 40%.  Plateau pressures 23, driving pressure 11 currently.  Right hand cyanotic and cool.  Trauma MD and nurse at bedside have loosened bandages.  Hand is less dusky and now has dopplerable pulses with improved cap refill.  Unasyn changed to meropenem due to ECMO pharmacokinetics and bioavailability.  Left chest tube currently with 1160 cc bloody output. Foley with clear light yellow urine.  BMP pending, Repeat head CT in AM unless changes in exam.  Reviewed case with Drs. Agarwala, Bensinhon, and Connor.  Almendra Loria P Nadia Viar, DO 08/05/21 11:57 PM [facility]  
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
## 2021-08-05 NOTE — Procedures (Signed)
Chest Tube Insertion Procedure Note  Indications:  Clinically significant Pneumothorax  Pre-operative Diagnosis: Pneumothorax and Hemothorax  Post-operative Diagnosis: Pneumothorax and Hemothorax  Procedure Details  Emergency consent was inferred.   After sterile skin prep, using standard technique, a 32 French tube was placed in the left lateral 5th rib space.  Findings: Rush of air and small volume hemothorax  Estimated Blood Loss:  Minimal         Specimens:  None     Confirmation chest x-ray obtained, with ongoing mediastinal shift and opacification of the left hemithorax.  Chest tube repositioned and directed posteriorly to better drain any residual hemothorax.  Repeat chest x-ray/CT chest with appropriate position of chest tube, opacity is secondary to extensive pulmonary contusion.

## 2021-08-05 NOTE — Progress Notes (Signed)
Patient arrived in ICU on ECMO, mechanical ventilation.  ECMO flow increased to 4 L/min.  Sweep increased when ventilator changed to rest settings.  Pressure support 12, PEEP 12, respiratory rate 15, FiO2 40%.  Plateau pressures 23, driving pressure 11 currently.  Right hand cyanotic and cool.  Trauma MD and nurse at bedside have loosened bandages.  Hand is less dusky and now has dopplerable pulses with improved cap refill.  Unasyn changed to meropenem due to ECMO pharmacokinetics and bioavailability.  Left chest tube currently with 1160 cc bloody output. Foley with clear light yellow urine.  BMP pending, Repeat head CT in AM unless changes in exam.  Reviewed case with Drs. Agarwala, Bensinhon, and Connor.  Steffanie Dunn, DO 08/05/21 11:57 PM [facility]
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
## 2021-08-05 NOTE — ED Notes (Signed)
Ortho MD at bedside.

## 2021-08-05 NOTE — Progress Notes (Signed)
   08/05/21 1740  Clinical Encounter Type  Visited With Patient not available  Visit Type Trauma  Referral From Nurse  Consult/Referral To Chaplain   Chaplain Tery Sanfilippo responded to the page. The patient is being attended to by the medical team. There is no support person present. Chaplain remains available for follow up spiritual/emotional support as needed. This note was prepared by Deneen Harts, M.Div..  For questions please contact by phone [phone].

## 2021-08-05 NOTE — Progress Notes (Signed)
Rec'd patient from cath lab.  Patient currently on ecmo.  Vent settings changed per MD order.

## 2021-08-05 NOTE — ED Notes (Signed)
Dr. Fredricka Bonine spoke with family - pt is Jeff [name] witness but consented to further blood products.

## 2021-08-05 NOTE — Code Documentation (Signed)
Negative fast.

## 2021-08-05 NOTE — Code Documentation (Signed)
[place] placed 

## 2021-08-05 NOTE — ED Notes (Signed)
Critical lactic received @ 5.3. Dr. Fredricka Bonine notified in person.
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
## 2021-08-05 NOTE — Anesthesia Procedure Notes (Signed)
Central Venous Catheter Insertion Performed by: Oleta Mouse, MD, anesthesiologist Start/End7/11/2021 8:31 PM, 08/05/2021 8:41 PM Patient location: OOR procedure area. Emergency situation Preanesthetic checklist: patient identified, IV checked, monitors and equipment checked and timeout performed Position: supine Patient sedated Hand hygiene performed  and maximum sterile barriers used  Catheter size: 9 Fr Total catheter length 10. Central line was placed.MAC introducer Procedure performed using ultrasound guided technique. Ultrasound Notes:anatomy identified, needle tip was noted to be adjacent to the nerve/plexus identified, no ultrasound evidence of intravascular and/or intraneural injection and image(s) printed for medical record Attempts: 1 Following insertion, dressing applied, line sutured and Biopatch. Post procedure assessment: blood return through all ports, free fluid flow and no air  Patient tolerated the procedure well with no immediate complications.
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
## 2021-08-05 NOTE — Progress Notes (Addendum)
VV ECMO initiated at 2233. Flowing 3.7Lpm. Hg 7.1 and ACT 307 before leaving CVL6. Pt transported to 2H04 without anything remarkable. Flowing 3.7Lpm; SvO2 in acceptable range; anesthesia gave 2 units PRBCs prior to leaving Cath Lab.
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
## 2021-08-05 NOTE — Transfer of Care (Signed)
Immediate Anesthesia Transfer of Care Note  Patient: Jeff [name]  Procedure(s) Performed: ECMO CANNULATION ARTERIAL LINE INSERTION  Patient Location: SICU  Anesthesia Type:General  Level of Consciousness: Patient remains intubated per anesthesia plan  Airway & Oxygen Therapy: Patient placed on Ventilator (see vital sign flow sheet for setting)  Post-op Assessment: Report given to RN  Post vital signs: Reviewed and stable  Last Vitals:  Vitals Value Taken Time  BP 105/58   Temp 36.9   Pulse 96   Resp    SpO2 90     Last Pain: There were no vitals filed for this visit.       Complications: No notable events documented.
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
## 2021-08-05 NOTE — Progress Notes (Signed)
   08/05/21 2121  Clinical Encounter Type  Visited With Family  Visit Type Follow-up  Referral From Nurse  Consult/Referral To Chaplain   Chaplain Tery Sanfilippo received call from the Trauma Attending Nurse,requesting this Chaplain to escort family to [facility] waiting area. The patient's father was speaking with two MD's while his daughters left to get something to eat. After the MD's left, the father appeared to be tearful. This Chaplain encouraged sharing of feelings. This Chaplain engaged in active listening as he explored ethical dilemma that the patient's mother is a Industrial/product designer Witness and the patient had to receive blood. The father said he understood because he wants his son to live but do not know how to explain their decision to his wife. The father explored his spiritual and religious belief as he contemplated  and explored the nature of God in this situation. When his daughters returned, the youngest daughter began to express her faith and spoke their religious practices which seemed to bring them comfort.  This Chaplain escorted the family to [facility] waiting area and informed a nurse in the Cath Lab where the family could be found and the family is requesting an update. Family expressed appreciation of Chaplain's presence. Advise this Chaplain remains available for followup support as needed. This note was prepared by Deneen Harts, M.Div..  For questions please contact by phone [phone].
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
## 2021-08-06 ENCOUNTER — Inpatient Hospital Stay (HOSPITAL_COMMUNITY): Payer: Medicaid Other

## 2021-08-06 ENCOUNTER — Encounter (HOSPITAL_COMMUNITY): Payer: Self-pay | Admitting: Internal Medicine

## 2021-08-06 DIAGNOSIS — S299XXA Unspecified injury of thorax, initial encounter: Secondary | ICD-10-CM

## 2021-08-06 DIAGNOSIS — J9621 Acute and chronic respiratory failure with hypoxia: Secondary | ICD-10-CM | POA: Diagnosis not present

## 2021-08-06 DIAGNOSIS — J80 Acute respiratory distress syndrome: Secondary | ICD-10-CM

## 2021-08-06 DIAGNOSIS — S069XAA Unspecified intracranial injury with loss of consciousness status unknown, initial encounter: Secondary | ICD-10-CM | POA: Diagnosis present

## 2021-08-06 DIAGNOSIS — J9622 Acute and chronic respiratory failure with hypercapnia: Secondary | ICD-10-CM | POA: Diagnosis not present

## 2021-08-06 DIAGNOSIS — S81801A Unspecified open wound, right lower leg, initial encounter: Secondary | ICD-10-CM

## 2021-08-06 DIAGNOSIS — S32421A Displaced fracture of posterior wall of right acetabulum, initial encounter for closed fracture: Secondary | ICD-10-CM

## 2021-08-06 DIAGNOSIS — S42362A Displaced segmental fracture of shaft of humerus, left arm, initial encounter for closed fracture: Secondary | ICD-10-CM

## 2021-08-06 LAB — PREPARE FRESH FROZEN PLASMA
Unit division: 0
Unit division: 0
Unit division: 0
Unit division: 0
Unit division: 0
Unit division: 0
Unit division: 0
Unit division: 0
Unit division: 0
Unit division: 0
Unit division: 0
Unit division: 0
Unit division: 0
Unit division: 0
Unit division: 0
Unit division: 0
Unit division: 0

## 2021-08-06 LAB — COMPREHENSIVE METABOLIC PANEL
ALT: 56 U/L — ABNORMAL HIGH (ref 0–44)
ALT: 44 U/L (ref 0–44)
AST: 120 U/L — ABNORMAL HIGH (ref 15–41)
AST: 95 U/L — ABNORMAL HIGH (ref 15–41)
Albumin: 3 g/dL — ABNORMAL LOW (ref 3.5–5.0)
Albumin: 3.3 g/dL — ABNORMAL LOW (ref 3.5–5.0)
Alkaline Phosphatase: 32 U/L — ABNORMAL LOW (ref 38–126)
Alkaline Phosphatase: 30 U/L — ABNORMAL LOW (ref 38–126)
Anion gap: 8 (ref 5–15)
Anion gap: 6 (ref 5–15)
BUN: 21 mg/dL — ABNORMAL HIGH (ref 6–20)
BUN: 16 mg/dL (ref 6–20)
CO2: 24 mmol/L (ref 22–32)
CO2: 22 mmol/L (ref 22–32)
Calcium: 7.3 mg/dL — ABNORMAL LOW (ref 8.9–10.3)
Calcium: 7.5 mg/dL — ABNORMAL LOW (ref 8.9–10.3)
Chloride: 110 mmol/L (ref 98–111)
Chloride: 113 mmol/L — ABNORMAL HIGH (ref 98–111)
Creatinine, Ser: 1.34 mg/dL — ABNORMAL HIGH (ref 0.61–1.24)
Creatinine, Ser: 1.03 mg/dL (ref 0.61–1.24)
GFR, Estimated: >60 mL/min (ref >60)
GFR, Estimated: >60 mL/min (ref >60)
Glucose, Bld: 135 mg/dL — ABNORMAL HIGH (ref 70–99)
Glucose, Bld: 133 mg/dL — ABNORMAL HIGH (ref 70–99)
Potassium: 3.4 mmol/L — ABNORMAL LOW (ref 3.5–5.1)
Potassium: 3.6 mmol/L (ref 3.5–5.1)
Sodium: 142 mmol/L (ref 135–145)
Sodium: 141 mmol/L (ref 135–145)
Total Bilirubin: 1.8 mg/dL — ABNORMAL HIGH (ref 0.3–1.2)
Total Bilirubin: 1.7 mg/dL — ABNORMAL HIGH (ref 0.3–1.2)
Total Protein: 4.3 g/dL — ABNORMAL LOW (ref 6.5–8.1)
Total Protein: 4.4 g/dL — ABNORMAL LOW (ref 6.5–8.1)

## 2021-08-06 LAB — PREPARE PLATELET PHERESIS
Unit division: 0
Unit division: 0
Unit division: 0

## 2021-08-06 LAB — CBC WITH DIFFERENTIAL/PLATELET
Abs Immature Granulocytes: 0.01 10*3/uL (ref 0.00–0.07)
Abs Immature Granulocytes: 0.02 10*3/uL (ref 0.00–0.07)
Basophils Absolute: 0 10*3/uL (ref 0.0–0.1)
Basophils Absolute: 0 10*3/uL (ref 0.0–0.1)
Basophils Relative: 0 %
Basophils Relative: 0 %
Eosinophils Absolute: 0 10*3/uL (ref 0.0–0.5)
Eosinophils Absolute: 0 10*3/uL (ref 0.0–0.5)
Eosinophils Relative: 0 %
Eosinophils Relative: 0 %
HCT: 22 % — ABNORMAL LOW (ref 39.0–52.0)
HCT: 20.1 % — ABNORMAL LOW (ref 39.0–52.0)
Hemoglobin: 7.8 g/dL — ABNORMAL LOW (ref 13.0–17.0)
Hemoglobin: 7.1 g/dL — ABNORMAL LOW (ref 13.0–17.0)
Immature Granulocytes: 0 %
Immature Granulocytes: 0 %
Lymphocytes Relative: 19 %
Lymphocytes Relative: 27 %
Lymphs Abs: 2 10*3/uL (ref 0.7–4.0)
Lymphs Abs: 1.8 10*3/uL (ref 0.7–4.0)
MCH: 29.9 pg (ref 26.0–34.0)
MCH: 29.5 pg (ref 26.0–34.0)
MCHC: 35.5 g/dL (ref 30.0–36.0)
MCHC: 35.3 g/dL (ref 30.0–36.0)
MCV: 84.3 fL (ref 80.0–100.0)
MCV: 83.4 fL (ref 80.0–100.0)
Monocytes Absolute: 0.8 10*3/uL (ref 0.1–1.0)
Monocytes Absolute: 0.5 10*3/uL (ref 0.1–1.0)
Monocytes Relative: 8 %
Monocytes Relative: 7 %
Neutro Abs: 7.7 10*3/uL (ref 1.7–7.7)
Neutro Abs: 4.3 10*3/uL (ref 1.7–7.7)
Neutrophils Relative %: 73 %
Neutrophils Relative %: 66 %
Platelets: 68 10*3/uL — ABNORMAL LOW (ref 150–400)
Platelets: 52 10*3/uL — ABNORMAL LOW (ref 150–400)
RBC: 2.61 MIL/uL — ABNORMAL LOW (ref 4.22–5.81)
RBC: 2.41 MIL/uL — ABNORMAL LOW (ref 4.22–5.81)
RDW: 14.8 % (ref 11.5–15.5)
RDW: 15.4 % (ref 11.5–15.5)
WBC: 10.5 10*3/uL (ref 4.0–10.5)
WBC: 6.7 10*3/uL (ref 4.0–10.5)
nRBC: 0 % (ref 0.0–0.2)
nRBC: 0 % (ref 0.0–0.2)

## 2021-08-06 LAB — BPAM FFP
Blood Product Expiration Date: 202307122359
Blood Product Expiration Date: 202307122359
Blood Product Expiration Date: 202307142359
Blood Product Expiration Date: 202307142359
Blood Product Expiration Date: 202307152359
Blood Product Expiration Date: 202307152359
Blood Product Expiration Date: 202307162359
Blood Product Expiration Date: 202307162359
Blood Product Expiration Date: 202307162359
Blood Product Expiration Date: 202307162359
Blood Product Expiration Date: 202307162359
Blood Product Expiration Date: 202307162359
Blood Product Expiration Date: 202307172359
Blood Product Expiration Date: 202307212359
Blood Product Expiration Date: 202307212359
Blood Product Expiration Date: 202307212359
Blood Product Expiration Date: 202307282359
Blood Product Expiration Date: 202307282359
Blood Product Expiration Date: 202307292359
Blood Product Expiration Date: 202307312359
Blood Product Expiration Date: 202307312359
ISSUE DATE / TIME: 202307111806
ISSUE DATE / TIME: 202307111806
ISSUE DATE / TIME: 202307111810
ISSUE DATE / TIME: 202307111810
ISSUE DATE / TIME: 202307111810
ISSUE DATE / TIME: 202307111810
ISSUE DATE / TIME: 202307111810
ISSUE DATE / TIME: 202307111810
ISSUE DATE / TIME: 202307111810
ISSUE DATE / TIME: 202307111810
ISSUE DATE / TIME: 202307111829
ISSUE DATE / TIME: 202307111829
ISSUE DATE / TIME: 202307111829
ISSUE DATE / TIME: 202307112004
ISSUE DATE / TIME: 202307112004
ISSUE DATE / TIME: 202307112101
ISSUE DATE / TIME: 202307112101
ISSUE DATE / TIME: 202307112101
ISSUE DATE / TIME: 202307112101
ISSUE DATE / TIME: 202307120938
ISSUE DATE / TIME: 202307120938
Unit Type and Rh: 600
Unit Type and Rh: 6200
Unit Type and Rh: 6200
Unit Type and Rh: 6200
Unit Type and Rh: 6200
Unit Type and Rh: 6200
Unit Type and Rh: 6200
Unit Type and Rh: 6200
Unit Type and Rh: 6200
Unit Type and Rh: 6200
Unit Type and Rh: 6200
Unit Type and Rh: 6200
Unit Type and Rh: 6200
Unit Type and Rh: 6200
Unit Type and Rh: 6200
Unit Type and Rh: 6200
Unit Type and Rh: 6200
Unit Type and Rh: 6200
Unit Type and Rh: 6200
Unit Type and Rh: 6200
Unit Type and Rh: 6200

## 2021-08-06 LAB — GLOBAL TEG PANEL
CFF Max Amplitude: 13.8 mm — ABNORMAL LOW (ref 15–32)
CK with Heparinase (R): 8.6 min — ABNORMAL HIGH (ref 4.3–8.3)
Citrated Functional Fibrinogen: 251.8 mg/dL — ABNORMAL LOW (ref 278–581)
Citrated Kaolin (K): 3 min — ABNORMAL HIGH (ref 0.8–2.1)
Citrated Kaolin (MA): 47.8 mm — ABNORMAL LOW (ref 52–69)
Citrated Kaolin (R): 9.6 min — ABNORMAL HIGH (ref 4.6–9.1)
Citrated Kaolin Angle: 57.3 deg — ABNORMAL LOW (ref 63–78)
Citrated Rapid TEG (MA): 47.6 mm — ABNORMAL LOW (ref 52–70)

## 2021-08-06 LAB — CBC
HCT: 23.6 % — ABNORMAL LOW (ref 39.0–52.0)
HCT: 29.3 % — ABNORMAL LOW (ref 39.0–52.0)
Hemoglobin: 10.4 g/dL — ABNORMAL LOW (ref 13.0–17.0)
Hemoglobin: 8.6 g/dL — ABNORMAL LOW (ref 13.0–17.0)
MCH: 29.6 pg (ref 26.0–34.0)
MCH: 30.1 pg (ref 26.0–34.0)
MCHC: 35.5 g/dL (ref 30.0–36.0)
MCHC: 36.4 g/dL — ABNORMAL HIGH (ref 30.0–36.0)
MCV: 82.5 fL (ref 80.0–100.0)
MCV: 83.5 fL (ref 80.0–100.0)
Platelets: 69 10*3/uL — ABNORMAL LOW (ref 150–400)
Platelets: 82 10*3/uL — ABNORMAL LOW (ref 150–400)
RBC: 2.86 MIL/uL — ABNORMAL LOW (ref 4.22–5.81)
RBC: 3.51 MIL/uL — ABNORMAL LOW (ref 4.22–5.81)
RDW: 14.5 % (ref 11.5–15.5)
RDW: 14.6 % (ref 11.5–15.5)
WBC: 10.9 10*3/uL — ABNORMAL HIGH (ref 4.0–10.5)
WBC: 9.5 10*3/uL (ref 4.0–10.5)
nRBC: 0 % (ref 0.0–0.2)
nRBC: 0.3 % — ABNORMAL HIGH (ref 0.0–0.2)

## 2021-08-06 LAB — POCT I-STAT 7, (LYTES, BLD GAS, ICA,H+H)
Acid-Base Excess: 0 mmol/L (ref 0.0–2.0)
Acid-Base Excess: 1 mmol/L (ref 0.0–2.0)
Acid-base deficit: 1 mmol/L (ref 0.0–2.0)
Acid-base deficit: 1 mmol/L (ref 0.0–2.0)
Acid-base deficit: 2 mmol/L (ref 0.0–2.0)
Acid-base deficit: 3 mmol/L — ABNORMAL HIGH (ref 0.0–2.0)
Bicarbonate: 21.9 mmol/L (ref 20.0–28.0)
Bicarbonate: 22.2 mmol/L (ref 20.0–28.0)
Bicarbonate: 22.4 mmol/L (ref 20.0–28.0)
Bicarbonate: 23.5 mmol/L (ref 20.0–28.0)
Bicarbonate: 23.5 mmol/L (ref 20.0–28.0)
Bicarbonate: 25.3 mmol/L (ref 20.0–28.0)
Calcium, Ion: 0.92 mmol/L — ABNORMAL LOW (ref 1.15–1.40)
Calcium, Ion: 1.04 mmol/L — ABNORMAL LOW (ref 1.15–1.40)
Calcium, Ion: 1.06 mmol/L — ABNORMAL LOW (ref 1.15–1.40)
Calcium, Ion: 1.11 mmol/L — ABNORMAL LOW (ref 1.15–1.40)
Calcium, Ion: 1.12 mmol/L — ABNORMAL LOW (ref 1.15–1.40)
Calcium, Ion: 1.18 mmol/L (ref 1.15–1.40)
HCT: 19 % — ABNORMAL LOW (ref 39.0–52.0)
HCT: 21 % — ABNORMAL LOW (ref 39.0–52.0)
HCT: 21 % — ABNORMAL LOW (ref 39.0–52.0)
HCT: 22 % — ABNORMAL LOW (ref 39.0–52.0)
HCT: 23 % — ABNORMAL LOW (ref 39.0–52.0)
HCT: 29 % — ABNORMAL LOW (ref 39.0–52.0)
Hemoglobin: 6.5 g/dL — CL (ref 13.0–17.0)
Hemoglobin: 7.1 g/dL — ABNORMAL LOW (ref 13.0–17.0)
Hemoglobin: 7.1 g/dL — ABNORMAL LOW (ref 13.0–17.0)
Hemoglobin: 7.5 g/dL — ABNORMAL LOW (ref 13.0–17.0)
Hemoglobin: 7.8 g/dL — ABNORMAL LOW (ref 13.0–17.0)
Hemoglobin: 9.9 g/dL — ABNORMAL LOW (ref 13.0–17.0)
O2 Saturation: 100 %
O2 Saturation: 95 %
O2 Saturation: 96 %
O2 Saturation: 98 %
O2 Saturation: 98 %
O2 Saturation: 99 %
Patient temperature: 36.1
Patient temperature: 36.1
Patient temperature: 36.7
Patient temperature: 36.7
Patient temperature: 36.9
Patient temperature: 36.9
Potassium: 3.4 mmol/L — ABNORMAL LOW (ref 3.5–5.1)
Potassium: 3.5 mmol/L (ref 3.5–5.1)
Potassium: 3.6 mmol/L (ref 3.5–5.1)
Potassium: 3.6 mmol/L (ref 3.5–5.1)
Potassium: 3.7 mmol/L (ref 3.5–5.1)
Potassium: 4.1 mmol/L (ref 3.5–5.1)
Sodium: 141 mmol/L (ref 135–145)
Sodium: 142 mmol/L (ref 135–145)
Sodium: 142 mmol/L (ref 135–145)
Sodium: 144 mmol/L (ref 135–145)
Sodium: 145 mmol/L (ref 135–145)
Sodium: 145 mmol/L (ref 135–145)
TCO2: 23 mmol/L (ref 22–32)
TCO2: 23 mmol/L (ref 22–32)
TCO2: 24 mmol/L (ref 22–32)
TCO2: 25 mmol/L (ref 22–32)
TCO2: 25 mmol/L (ref 22–32)
TCO2: 26 mmol/L (ref 22–32)
pCO2 arterial: 28.9 mmHg — ABNORMAL LOW (ref 32–48)
pCO2 arterial: 33.7 mmHg (ref 32–48)
pCO2 arterial: 34.5 mmHg (ref 32–48)
pCO2 arterial: 37 mmHg (ref 32–48)
pCO2 arterial: 37.1 mmHg (ref 32–48)
pCO2 arterial: 39 mmHg (ref 32–48)
pH, Arterial: 7.368 (ref 7.35–7.45)
pH, Arterial: 7.409 (ref 7.35–7.45)
pH, Arterial: 7.413 (ref 7.35–7.45)
pH, Arterial: 7.439 (ref 7.35–7.45)
pH, Arterial: 7.452 — ABNORMAL HIGH (ref 7.35–7.45)
pH, Arterial: 7.485 — ABNORMAL HIGH (ref 7.35–7.45)
pO2, Arterial: 104 mmHg (ref 83–108)
pO2, Arterial: 117 mmHg — ABNORMAL HIGH (ref 83–108)
pO2, Arterial: 130 mmHg — ABNORMAL HIGH (ref 83–108)
pO2, Arterial: 203 mmHg — ABNORMAL HIGH (ref 83–108)
pO2, Arterial: 75 mmHg — ABNORMAL LOW (ref 83–108)
pO2, Arterial: 78 mmHg — ABNORMAL LOW (ref 83–108)

## 2021-08-06 LAB — URINALYSIS, ROUTINE W REFLEX MICROSCOPIC
Bilirubin Urine: NEGATIVE
Glucose, UA: NEGATIVE mg/dL
Ketones, ur: NEGATIVE mg/dL
Leukocytes,Ua: NEGATIVE
Nitrite: NEGATIVE
Protein, ur: 100 mg/dL — AB
Specific Gravity, Urine: 1.035 — ABNORMAL HIGH (ref 1.005–1.030)
pH: 5 (ref 5.0–8.0)

## 2021-08-06 LAB — GLUCOSE, CAPILLARY
Glucose-Capillary: 125 mg/dL — ABNORMAL HIGH (ref 70–99)
Glucose-Capillary: 129 mg/dL — ABNORMAL HIGH (ref 70–99)
Glucose-Capillary: 135 mg/dL — ABNORMAL HIGH (ref 70–99)
Glucose-Capillary: 142 mg/dL — ABNORMAL HIGH (ref 70–99)
Glucose-Capillary: 162 mg/dL — ABNORMAL HIGH (ref 70–99)

## 2021-08-06 LAB — BPAM PLATELET PHERESIS
Blood Product Expiration Date: 202307112359
Blood Product Expiration Date: 202307132359
Blood Product Expiration Date: 202307142359
ISSUE DATE / TIME: 202307111814
ISSUE DATE / TIME: 202307112106
ISSUE DATE / TIME: 202307120932
Unit Type and Rh: 6200
Unit Type and Rh: 6200
Unit Type and Rh: 6200

## 2021-08-06 LAB — APTT
aPTT: >200 seconds (ref 24–36)
aPTT: 69 seconds — ABNORMAL HIGH (ref 24–36)
aPTT: 40 seconds — ABNORMAL HIGH (ref 24–36)

## 2021-08-06 LAB — PROTIME-INR
INR: 1.8 — ABNORMAL HIGH (ref 0.8–1.2)
INR: 1.7 — ABNORMAL HIGH (ref 0.8–1.2)
Prothrombin Time: 20.4 seconds — ABNORMAL HIGH (ref 11.4–15.2)
Prothrombin Time: 19.9 seconds — ABNORMAL HIGH (ref 11.4–15.2)

## 2021-08-06 LAB — PHOSPHORUS: Phosphorus: 2.1 mg/dL — ABNORMAL LOW (ref 2.5–4.6)

## 2021-08-06 LAB — ECHO INTRAOPERATIVE TEE
Height: 68 in
Weight: 2948.87 oz

## 2021-08-06 LAB — HEPATIC FUNCTION PANEL
ALT: 67 U/L — ABNORMAL HIGH (ref 0–44)
AST: 133 U/L — ABNORMAL HIGH (ref 15–41)
Albumin: 2.7 g/dL — ABNORMAL LOW (ref 3.5–5.0)
Alkaline Phosphatase: 41 U/L (ref 38–126)
Bilirubin, Direct: 0.3 mg/dL — ABNORMAL HIGH (ref 0.0–0.2)
Indirect Bilirubin: 1.6 mg/dL — ABNORMAL HIGH (ref 0.3–0.9)
Total Bilirubin: 1.9 mg/dL — ABNORMAL HIGH (ref 0.3–1.2)
Total Protein: 4.3 g/dL — ABNORMAL LOW (ref 6.5–8.1)

## 2021-08-06 LAB — RESP PANEL BY RT-PCR (FLU A&B, COVID) ARPGX2
Influenza A by PCR: NEGATIVE
Influenza B by PCR: NEGATIVE
SARS Coronavirus 2 by RT PCR: NEGATIVE

## 2021-08-06 LAB — TRIGLYCERIDES: Triglycerides: 115 mg/dL (ref <150)

## 2021-08-06 LAB — PREPARE CRYOPRECIPITATE: Unit division: 0

## 2021-08-06 LAB — MRSA NEXT GEN BY PCR, NASAL: MRSA by PCR Next Gen: NOT DETECTED

## 2021-08-06 LAB — BLOOD PRODUCT ORDER (VERBAL) VERIFICATION

## 2021-08-06 LAB — BPAM CRYOPRECIPITATE
Blood Product Expiration Date: 202307120025
ISSUE DATE / TIME: 202307111900
Unit Type and Rh: 6200

## 2021-08-06 LAB — BASIC METABOLIC PANEL
Anion gap: 16 — ABNORMAL HIGH (ref 5–15)
BUN: 19 mg/dL (ref 6–20)
CO2: 21 mmol/L — ABNORMAL LOW (ref 22–32)
Calcium: 6.4 mg/dL — CL (ref 8.9–10.3)
Chloride: 105 mmol/L (ref 98–111)
Creatinine, Ser: 1.52 mg/dL — ABNORMAL HIGH (ref 0.61–1.24)
GFR, Estimated: >60 mL/min (ref >60)
Glucose, Bld: 139 mg/dL — ABNORMAL HIGH (ref 70–99)
Potassium: 3.5 mmol/L (ref 3.5–5.1)
Sodium: 142 mmol/L (ref 135–145)

## 2021-08-06 LAB — FIBRINOGEN
Fibrinogen: 195 mg/dL — ABNORMAL LOW (ref 210–475)
Fibrinogen: 183 mg/dL — ABNORMAL LOW (ref 210–475)

## 2021-08-06 LAB — PREPARE RBC (CROSSMATCH)

## 2021-08-06 LAB — HEPARIN LEVEL (UNFRACTIONATED)
Heparin Unfractionated: >1.1 IU/mL — ABNORMAL HIGH (ref 0.30–0.70)
Heparin Unfractionated: <0.1 IU/mL — ABNORMAL LOW (ref 0.30–0.70)

## 2021-08-06 LAB — LACTIC ACID, PLASMA
Lactic Acid, Venous: 4.5 mmol/L (ref 0.5–1.9)
Lactic Acid, Venous: 3.1 mmol/L (ref 0.5–1.9)
Lactic Acid, Venous: 2.4 mmol/L (ref 0.5–1.9)

## 2021-08-06 LAB — HIV ANTIBODY (ROUTINE TESTING W REFLEX): HIV Screen 4th Generation wRfx: NONREACTIVE

## 2021-08-06 LAB — LACTATE DEHYDROGENASE
LDH: 439 U/L — ABNORMAL HIGH (ref 98–192)
LDH: 418 U/L — ABNORMAL HIGH (ref 98–192)

## 2021-08-06 LAB — MAGNESIUM
Magnesium: 1.6 mg/dL — ABNORMAL LOW (ref 1.7–2.4)
Magnesium: 2.3 mg/dL (ref 1.7–2.4)

## 2021-08-06 LAB — MASSIVE TRANSFUSION PROTOCOL ORDER (BLOOD BANK NOTIFICATION)

## 2021-08-06 MED ORDER — PIVOT 1.5 CAL PO LIQD
1000.0000 mL | ORAL | Status: DC
  Administered 2021-08-07: 1000 mL

## 2021-08-06 MED ORDER — PHENYLEPHRINE CONCENTRATED 100MG/250ML (0.4 MG/ML) INFUSION SIMPLE
0.0000 ug/min | INTRAVENOUS | Status: DC
  Administered 2021-08-06: 50 ug/min via INTRAVENOUS
  Filled 2021-08-06 (×2): qty 250

## 2021-08-06 MED ORDER — PROPOFOL 1000 MG/100ML IV EMUL
0.0000 ug/kg/min | INTRAVENOUS | Status: DC
  Administered 2021-08-06: 30 ug/kg/min via INTRAVENOUS
  Filled 2021-08-06: qty 100

## 2021-08-06 MED ORDER — PROSOURCE TF PO LIQD: 45.0000 mL | Freq: Two times a day (BID) | ORAL | Status: DC

## 2021-08-06 MED ORDER — MIDAZOLAM-SODIUM CHLORIDE 100-0.9 MG/100ML-% IV SOLN
INTRAVENOUS | Status: AC
  Filled 2021-08-06: qty 100

## 2021-08-06 MED ORDER — LACTATED RINGERS IV SOLN: INTRAVENOUS | Status: DC

## 2021-08-06 MED ORDER — FENTANYL CITRATE (PF) 250 MCG/5ML IJ SOLN
INTRAMUSCULAR | Status: AC
  Filled 2021-08-06: qty 5

## 2021-08-06 MED ORDER — ALBUMIN HUMAN 5 % IV SOLN
12.5000 g | INTRAVENOUS | Status: AC | PRN
  Administered 2021-08-06 – 2021-08-11 (×3): 12.5 g via INTRAVENOUS
  Filled 2021-08-06 (×3): qty 250

## 2021-08-06 MED ORDER — SODIUM CHLORIDE 0.9% IV SOLUTION: Freq: Once | INTRAVENOUS | Status: DC

## 2021-08-06 MED ORDER — ATROPINE SULFATE 1 MG/10ML IJ SOSY
PREFILLED_SYRINGE | INTRAMUSCULAR | Status: AC
  Filled 2021-08-06: qty 10

## 2021-08-06 MED ORDER — MIDAZOLAM-SODIUM CHLORIDE 100-0.9 MG/100ML-% IV SOLN
0.5000 mg/h | INTRAVENOUS | Status: DC
  Administered 2021-08-06: 2 mg/h via INTRAVENOUS
  Administered 2021-08-06 – 2021-08-08 (×4): 8 mg/h via INTRAVENOUS
  Administered 2021-08-11: 4 mg/h via INTRAVENOUS
  Filled 2021-08-06 (×5): qty 100

## 2021-08-06 MED ORDER — PHENYLEPHRINE 80 MCG/ML (10ML) SYRINGE FOR IV PUSH (FOR BLOOD PRESSURE SUPPORT)
PREFILLED_SYRINGE | INTRAVENOUS | Status: AC
  Filled 2021-08-06: qty 10

## 2021-08-06 MED ORDER — ACETAMINOPHEN 10 MG/ML IV SOLN
1000.0000 mg | Freq: Four times a day (QID) | INTRAVENOUS | Status: AC
  Administered 2021-08-06 – 2021-08-07 (×4): 1000 mg via INTRAVENOUS
  Filled 2021-08-06 (×4): qty 100

## 2021-08-06 MED ORDER — POTASSIUM CHLORIDE 20 MEQ PO PACK: 40.0000 meq | PACK | Freq: Once | ORAL | Status: DC

## 2021-08-06 MED ORDER — PROSOURCE TF PO LIQD
45.0000 mL | Freq: Four times a day (QID) | ORAL | Status: DC
  Administered 2021-08-06 – 2021-08-08 (×8): 45 mL
  Filled 2021-08-06 (×8): qty 45

## 2021-08-06 MED ORDER — NOREPINEPHRINE 4 MG/250ML-% IV SOLN
0.0000 ug/min | INTRAVENOUS | Status: DC
  Administered 2021-08-06: 10 ug/min via INTRAVENOUS
  Administered 2021-08-06: 6 ug/min via INTRAVENOUS
  Administered 2021-08-08: 7 ug/min via INTRAVENOUS
  Administered 2021-08-08: 6 ug/min via INTRAVENOUS
  Administered 2021-08-08: 7 ug/min via INTRAVENOUS
  Filled 2021-08-06 (×6): qty 250

## 2021-08-06 MED ORDER — EPINEPHRINE 1 MG/10ML IJ SOSY
PREFILLED_SYRINGE | INTRAMUSCULAR | Status: AC
  Filled 2021-08-06: qty 20

## 2021-08-06 MED ORDER — CHLORHEXIDINE GLUCONATE CLOTH 2 % EX PADS
6.0000 | MEDICATED_PAD | Freq: Every day | CUTANEOUS | Status: DC
  Administered 2021-08-06 – 2021-09-01 (×23): 6 via TOPICAL

## 2021-08-06 MED ORDER — PHENYLEPHRINE HCL-NACL 20-0.9 MG/250ML-% IV SOLN
INTRAVENOUS | Status: AC
  Filled 2021-08-06: qty 250

## 2021-08-06 MED ORDER — ALBUMIN HUMAN 5 % IV SOLN
INTRAVENOUS | Status: AC
  Filled 2021-08-06: qty 500

## 2021-08-06 MED ORDER — POTASSIUM PHOSPHATES 15 MMOLE/5ML IV SOLN
30.0000 mmol | Freq: Once | INTRAVENOUS | Status: AC
  Administered 2021-08-06: 30 mmol via INTRAVENOUS
  Filled 2021-08-06: qty 10

## 2021-08-06 MED ORDER — POTASSIUM CHLORIDE 10 MEQ/50ML IV SOLN
10.0000 meq | INTRAVENOUS | Status: AC
  Administered 2021-08-06 (×4): 10 meq via INTRAVENOUS
  Filled 2021-08-06 (×4): qty 50

## 2021-08-06 MED ORDER — PIVOT 1.5 CAL PO LIQD: 1000.0000 mL | ORAL | Status: DC

## 2021-08-06 MED ORDER — SODIUM BICARBONATE 8.4 % IV SOLN
INTRAVENOUS | Status: AC
  Filled 2021-08-06: qty 100

## 2021-08-06 MED ORDER — MAGNESIUM SULFATE 4 GM/100ML IV SOLN
4.0000 g | Freq: Once | INTRAVENOUS | Status: AC
  Administered 2021-08-06: 4 g via INTRAVENOUS
  Filled 2021-08-06: qty 100

## 2021-08-06 MED ORDER — POTASSIUM CHLORIDE 20 MEQ PO PACK
40.0000 meq | PACK | Freq: Once | ORAL | Status: AC
Start: 2021-08-06 — End: 2021-08-06
  Administered 2021-08-06: 40 meq
  Filled 2021-08-06: qty 2

## 2021-08-06 MED ORDER — SODIUM CHLORIDE 0.9 % IV SOLN: INTRAVENOUS | Status: DC

## 2021-08-06 MED ORDER — CALCIUM GLUCONATE-NACL 2-0.675 GM/100ML-% IV SOLN
2.0000 g | Freq: Once | INTRAVENOUS | Status: AC
  Administered 2021-08-06: 2000 mg via INTRAVENOUS
  Filled 2021-08-06: qty 100

## 2021-08-06 MED FILL — Midazolam HCl Inj 5 MG/5ML (Base Equivalent): INTRAMUSCULAR | Qty: 5 | Status: AC
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
## 2021-08-06 NOTE — Assessment & Plan Note (Deleted)
-   Ultra lung protective ventilation . - VV ECMO support, Vt still only 350 - would need to reach 410 prior to discontinuation.
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
## 2021-08-06 NOTE — Procedures (Addendum)
Cortrak  Person Inserting Tube:  Jeff [name], RD Tube Type:  Cortrak - 55 inches Tube Size:  10 Tube Location:  Right nare Secured by: Bridle Technique Used to Measure Tube Placement:  Marking at nare/corner of mouth Cortrak Secured At:  86 cm Procedure Comments:  Cortrak Tube Team Note:  Consult received to place a Cortrak feeding tube.   X-ray is required, abdominal x-ray has been ordered by the Cortrak team. Please confirm tube placement before using the Cortrak tube.   If the tube becomes dislodged please keep the tube and contact the Cortrak team at [email or web address] ([email or web address]) for replacement.  If after hours and replacement cannot be delayed, place a NG tube and confirm placement with an abdominal x-ray.   Jeff Starcher MS, RDN, LDN, CNSC Registered Dietitian 3 Clinical Nutrition RD Pager and On-Call Pager Number Located in [place]

## 2021-08-06 NOTE — Progress Notes (Signed)
OT Cancellation Note  Patient Details Name: Jeff [name] MRN: [id number] DOB: Aug 18, 2000   Cancelled Treatment:    Reason Eval/Treat Not Completed: Medical issues which prohibited therapy.  Patient scheduled for OR today, OT to hold and follow up to see if any OT needs exist post surgery.  Continue as appropriate.   Kolson Chovanec D Apolinar Bero 08/06/2021, 8:38 AM
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
## 2021-08-06 NOTE — Assessment & Plan Note (Deleted)
Continue ECMO support

## 2021-08-06 NOTE — Progress Notes (Signed)
   08/05/21 2350  Clinical Encounter Type  Visited With Patient and family together  Visit Type Follow-up;Spiritual support;Social support  Consult/Referral To Chaplain   Chaplain Tery Sanfilippo followed up with family. Provided calming presence and actively listened to the patient's father as he engaged in storytelling about his life in [place] and raising his family. Escorted family out and provided hospitality to the patient's oldest sister as she prepared to spend the night. Advised Chaplain remains available as needed. This note was prepared by Deneen Harts, M.Div..  For questions please contact by phone [phone].

## 2021-08-06 NOTE — Assessment & Plan Note (Deleted)
Chest tube in place. Minimal drainage Consolidation left lung.   -ECMO and ultra lung protective ventilation. - Increase multimodal pain control in anticipation of pain from rib fractures as systemic sedation decreased.

## 2021-08-06 NOTE — Progress Notes (Signed)
Trauma/Critical Care Follow Up Note  Subjective:    Overnight Issues:   Objective:  Vital signs for last 24 hours: Temp:  [97 F (36.1 C)-103.8 F (39.9 C)] 98.1 F (36.7 C) (07/12 0945) Pulse Rate:  [82-180] 83 (07/12 0945) Resp:  [0-45] 15 (07/12 0945) BP: (51-184)/(15-101) 113/50 (07/12 0737) SpO2:  [75 %-100 %] 97 % (07/12 0945) FiO2 (%):  [50 %-100 %] 50 % (07/12 0737) Weight:  [68 kg-83.6 kg] 83.6 kg (07/12 0500)  Hemodynamic parameters for last 24 hours:    Intake/Output from previous day: 07/11 0701 - 07/12 0700 In: 6209.4 [I.V.:3718.3; Blood:630; NG/GT:50; IV Piggyback:1811.1] Out: 2580 [Urine:1070; Chest Tube:1510]  Intake/Output this shift: Total I/O In: 578.4 [I.V.:160; IV Piggyback:418.4] Out: 255 [Urine:205; Chest Tube:50]  Vent settings for last 24 hours: Vent Mode: PCV FiO2 (%):  [50 %-100 %] 50 % Set Rate:  [15 bmp-24 bmp] 15 bmp Vt Set:  [550 mL] 550 mL PEEP:  [5 cmH20-12 cmH20] 12 cmH20 Pressure Support:  [12 cmH20] 12 cmH20 Plateau Pressure:  [11 cmH20-25 cmH20] 23 cmH20  Physical Exam:  Gen: comfortable, no distress Neuro: sedated HEENT: PERRL Neck: supple CV: RRR Pulm: unlabored breathing Abd: soft, NT GU: clear yellow urine Extr: wwp, soft tissue injury of RUE/RLE   Results for orders placed or performed during the hospital encounter of 08/05/21 (from the past 24 hour(s))  Prepare fresh frozen plasma     Status: None   Collection Time: 08/05/21  6:07 PM  Result Value Ref Range   Unit Number [id number]    Blood Component Type LIQ PLASMA    Unit division 00    Status of Unit ISSUED,FINAL    Transfusion Status OK TO TRANSFUSE    Unit Number [id number]    Blood Component Type LIQ PLASMA    Unit division 00    Status of Unit ISSUED,FINAL    Transfusion Status OK TO TRANSFUSE    Unit Number [id number]    Blood Component Type LIQ PLASMA    Unit division 00    Status of Unit ISSUED,FINAL    Transfusion Status OK TO  TRANSFUSE    Unit Number [id number]    Blood Component Type LIQ PLASMA    Unit division 00    Status of Unit ISSUED,FINAL    Transfusion Status OK TO TRANSFUSE    Unit Number [id number]    Blood Component Type LIQ PLASMA    Unit division 00    Status of Unit ISSUED,FINAL    Transfusion Status OK TO TRANSFUSE    Unit Number [id number]    Blood Component Type LIQ PLASMA    Unit division 00    Status of Unit ISSUED,FINAL    Transfusion Status OK TO TRANSFUSE    Unit Number [id number]    Blood Component Type THAWED PLASMA    Unit division 00    Status of Unit ISSUED,FINAL    Transfusion Status OK TO TRANSFUSE    Unit Number [id number]    Blood Component Type LIQ PLASMA    Unit division 00    Status of Unit ISSUED,FINAL    Transfusion Status OK TO TRANSFUSE    Unit Number [id number]    Blood Component Type LIQ PLASMA    Unit division 00    Status of Unit ISSUED,FINAL    Transfusion Status OK TO TRANSFUSE    Unit Number [id number]    Blood Component Type LIQ PLASMA    Unit division 00  Status of Unit ISSUED,FINAL    Transfusion Status      OK TO TRANSFUSE Performed at [facility], [address], [place], [place] [address]    Unit Number [id number]    Blood Component Type LIQ PLASMA    Unit division 00    Status of Unit ISSUED,FINAL    Unit tag comment EMERGENCY RELEASE    Transfusion Status OK TO TRANSFUSE    Unit Number [id number]    Blood Component Type LIQ PLASMA    Unit division 00    Status of Unit REL FROM [facility]    Unit tag comment EMERGENCY RELEASE    Transfusion Status OK TO TRANSFUSE    Unit Number [id number]    Blood Component Type LIQ PLASMA    Unit division 00    Status of Unit ISSUED,FINAL    Unit tag comment EMERGENCY RELEASE    Transfusion Status OK TO TRANSFUSE    Unit Number [id number]    Blood Component Type THW PLS APHR    Unit division B0    Status of Unit REL FROM [facility]    Unit tag comment EMERGENCY  RELEASE    Transfusion Status OK TO TRANSFUSE    Unit Number [id number]    Blood Component Type THW PLS APHR    Unit division B0    Status of Unit REL FROM [facility]    Unit tag comment EMERGENCY RELEASE    Transfusion Status OK TO TRANSFUSE    Unit Number [id number]    Blood Component Type THW PLS APHR    Unit division 00    Status of Unit REL FROM [facility]    Transfusion Status OK TO TRANSFUSE    Unit Number [id number]    Blood Component Type THW PLS APHR    Unit division A0    Status of Unit REL FROM [facility]    Transfusion Status OK TO TRANSFUSE    Unit Number [id number]    Blood Component Type THW PLS APHR    Unit division B0    Status of Unit REL FROM [facility]    Transfusion Status OK TO TRANSFUSE    Unit Number [id number]    Blood Component Type THW PLS APHR    Unit division 00    Status of Unit REL FROM [facility]    Transfusion Status OK TO TRANSFUSE    Unit Number [id number]    Blood Component Type THW PLS APHR    Unit division 00    Status of Unit REL FROM [facility]    Transfusion Status OK TO TRANSFUSE    Unit Number [id number]    Blood Component Type THW PLS APHR    Unit division 00    Status of Unit REL FROM [facility]    Transfusion Status OK TO TRANSFUSE   Prepare platelet pheresis     Status: None   Collection Time: 08/05/21  6:12 PM  Result Value Ref Range   Unit Number [id number]    Blood Component Type PLTP1 PSORALEN TREATED    Unit division 00    Status of Unit ISSUED,FINAL    Transfusion Status      OK TO TRANSFUSE Performed at [facility], [address] [place], [place] [address]    Unit Number [id number]    Blood Component Type PLTP2 PSORALEN TREATED    Unit division 00    Status of Unit DISCARDED    Transfusion Status OK TO TRANSFUSE   Prepare cryoprecipitate  Status: None   Collection Time: 08/05/21  6:54 PM  Result Value Ref Range   Unit Number [id number]    Blood Component Type CRYPOOL THAW    Unit division 00     Status of Unit DISCARDED    Transfusion Status      OK TO TRANSFUSE Performed at [facility], [address] [place], [place] [address]   Type and screen Ordered by PROVIDER DEFAULT     Status: None (Preliminary result)   Collection Time: 08/05/21  7:25 PM  Result Value Ref Range   ABO/RH(D) A POS    Antibody Screen NEG    Sample Expiration 08/08/2021,2359    Unit Number [id number]    Blood Component Type RED CELLS,LR    Unit division 00    Status of Unit ISSUED,FINAL    Transfusion Status OK TO TRANSFUSE    Crossmatch Result COMPATIBLE    Unit tag comment EMERGENCY RELEASE    Unit Number [id number]    Blood Component Type RED CELLS,LR    Unit division 00    Status of Unit ISSUED,FINAL    Transfusion Status OK TO TRANSFUSE    Crossmatch Result COMPATIBLE    Unit tag comment EMERGENCY RELEASE    Unit Number [id number]    Blood Component Type RED CELLS,LR    Unit division 00    Status of Unit ISSUED,FINAL    Transfusion Status OK TO TRANSFUSE    Crossmatch Result COMPATIBLE    Unit tag comment EMERGENCY RELEASE    Unit Number [id number]    Blood Component Type RED CELLS,LR    Unit division 00    Status of Unit ISSUED,FINAL    Transfusion Status OK TO TRANSFUSE    Crossmatch Result COMPATIBLE    Unit tag comment EMERGENCY RELEASE    Unit Number [id number]    Blood Component Type RED CELLS,LR    Unit division 00    Status of Unit ISSUED,FINAL    Transfusion Status OK TO TRANSFUSE    Crossmatch Result COMPATIBLE    Unit tag comment EMERGENCY RELEASE    Unit Number [id number]    Blood Component Type RED CELLS,LR    Unit division 00    Status of Unit ISSUED,FINAL    Transfusion Status OK TO TRANSFUSE    Crossmatch Result COMPATIBLE    Unit tag comment EMERGENCY RELEASE    Unit Number [id number]    Blood Component Type RED CELLS,LR    Unit division 00    Status of Unit ISSUED,FINAL    Transfusion Status OK TO TRANSFUSE    Crossmatch  Result COMPATIBLE    Unit tag comment EMERGENCY RELEASE    Unit Number [id number]    Blood Component Type RED CELLS,LR    Unit division 00    Status of Unit ISSUED,FINAL    Transfusion Status OK TO TRANSFUSE    Crossmatch Result COMPATIBLE    Unit tag comment EMERGENCY RELEASE    Unit Number [id number]    Blood Component Type RED CELLS,LR    Unit division 00    Status of Unit ISSUED,FINAL    Transfusion Status OK TO TRANSFUSE    Crossmatch Result COMPATIBLE    Unit tag comment EMERGENCY RELEASE    Unit Number [id number]    Blood Component Type RED CELLS,LR    Unit division 00    Status of Unit ISSUED,FINAL    Transfusion Status OK TO TRANSFUSE    Crossmatch Result COMPATIBLE  Unit tag comment EMERGENCY RELEASE    Unit Number [id number]    Blood Component Type RED CELLS,LR    Unit division 00    Status of Unit REL FROM [facility]    Unit tag comment EMERGENCY RELEASE    Transfusion Status OK TO TRANSFUSE    Crossmatch Result NOT NEEDED    Unit Number [id number]    Blood Component Type RED CELLS,LR    Unit division 00    Status of Unit ISSUED,FINAL    Unit tag comment EMERGENCY RELEASE    Transfusion Status OK TO TRANSFUSE    Crossmatch Result NOT NEEDED    Unit Number [id number]    Blood Component Type RED CELLS,LR    Unit division 00    Status of Unit REL FROM [facility]    Unit tag comment EMERGENCY RELEASE    Transfusion Status OK TO TRANSFUSE    Crossmatch Result NOT NEEDED    Unit Number [id number]    Blood Component Type RED CELLS,LR    Unit division 00    Status of Unit REL FROM [facility]    Unit tag comment EMERGENCY RELEASE    Transfusion Status OK TO TRANSFUSE    Crossmatch Result NOT NEEDED    Unit Number [id number]    Blood Component Type RED CELLS,LR    Unit division 00    Status of Unit REL FROM [facility]    Unit tag comment EMERGENCY RELEASE    Transfusion Status OK TO TRANSFUSE    Crossmatch Result NOT NEEDED    Unit Number  [id number]    Blood Component Type RED CELLS,LR    Unit division 00    Status of Unit REL FROM [facility]    Unit tag comment EMERGENCY RELEASE    Transfusion Status OK TO TRANSFUSE    Crossmatch Result NOT NEEDED    Unit Number [id number]    Blood Component Type RED CELLS,LR    Unit division 00    Status of Unit REL FROM [facility]    Unit tag comment EMERGENCY RELEASE    Transfusion Status OK TO TRANSFUSE    Crossmatch Result NOT NEEDED    Unit Number [id number]    Blood Component Type RED CELLS,LR    Unit division 00    Status of Unit REL FROM [facility]    Unit tag comment EMERGENCY RELEASE    Transfusion Status OK TO TRANSFUSE    Crossmatch Result NOT NEEDED    Unit Number [id number]    Blood Component Type RED CELLS,LR    Unit division 00    Status of Unit REL FROM [facility]    Transfusion Status OK TO TRANSFUSE    Crossmatch Result Compatible    Unit Number [id number]    Blood Component Type RED CELLS,LR    Unit division 00    Status of Unit ISSUED,FINAL    Transfusion Status OK TO TRANSFUSE    Crossmatch Result Compatible    Unit Number [id number]    Blood Component Type RED CELLS,LR    Unit division 00    Status of Unit ISSUED,FINAL    Transfusion Status OK TO TRANSFUSE    Crossmatch Result      Compatible Performed at [facility], [address], [place], [place] [address]    Unit Number [id number]    Blood Component Type RED CELLS,LR    Unit division 00    Status of Unit ALLOCATED    Transfusion Status OK TO TRANSFUSE  Crossmatch Result Compatible    Unit Number [id number]    Blood Component Type RED CELLS,LR    Unit division 00    Status of Unit ISSUED    Transfusion Status OK TO TRANSFUSE    Crossmatch Result Compatible    Unit Number [id number]    Blood Component Type RED CELLS,LR    Unit division 00    Status of Unit ISSUED    Transfusion Status OK TO TRANSFUSE    Crossmatch Result Compatible    Unit Number [id number]     Blood Component Type RED CELLS,LR    Unit division 00    Status of Unit ISSUED    Transfusion Status OK TO TRANSFUSE    Crossmatch Result Compatible    Unit Number [id number]    Blood Component Type RED CELLS,LR    Unit division 00    Status of Unit ISSUED    Transfusion Status OK TO TRANSFUSE    Crossmatch Result Compatible    Unit Number [id number]    Blood Component Type RED CELLS,LR    Unit division 00    Status of Unit ALLOCATED    Transfusion Status OK TO TRANSFUSE    Crossmatch Result Compatible    Unit Number [id number]    Blood Component Type RBC LR PHER2    Unit division 00    Status of Unit ALLOCATED    Transfusion Status OK TO TRANSFUSE    Crossmatch Result Compatible    Unit Number [id number]    Blood Component Type RED CELLS,LR    Unit division 00    Status of Unit ALLOCATED    Transfusion Status OK TO TRANSFUSE    Crossmatch Result Compatible   Comprehensive metabolic panel     Status: Abnormal   Collection Time: 08/05/21  7:25 PM  Result Value Ref Range   Sodium 136 135 - 145 mmol/L   Potassium 4.7 3.5 - 5.1 mmol/L   Chloride 96 (L) 98 - 111 mmol/L   CO2 27 22 - 32 mmol/L   Glucose, Bld 322 (H) 70 - 99 mg/dL   BUN 17 6 - 20 mg/dL   Creatinine, Ser 3.55 (H) 0.61 - 1.24 mg/dL   Calcium 7.1 (L) 8.9 - 10.3 mg/dL   Total Protein 5.1 (L) 6.5 - 8.1 g/dL   Albumin 3.0 (L) 3.5 - 5.0 g/dL   AST 732 (H) 15 - 41 U/L   ALT 105 (H) 0 - 44 U/L   Alkaline Phosphatase 61 38 - 126 U/L   Total Bilirubin 0.9 0.3 - 1.2 mg/dL   GFR, Estimated 54 (L) >60 mL/min   Anion gap 13 5 - 15  CBC     Status: Abnormal   Collection Time: 08/05/21  7:25 PM  Result Value Ref Range   WBC 10.2 4.0 - 10.5 K/uL   RBC 3.89 (L) 4.22 - 5.81 MIL/uL   Hemoglobin 12.0 (L) 13.0 - 17.0 g/dL   HCT 20.2 (L) 54.2 - 70.6 %   MCV 89.7 80.0 - 100.0 fL   MCH 30.8 26.0 - 34.0 pg   MCHC 34.4 30.0 - 36.0 g/dL   RDW 23.7 62.8 - 31.5 %   Platelets 158 150 - 400 K/uL   nRBC 0.0 0.0 - 0.2 %   Ethanol     Status: None   Collection Time: 08/05/21  7:25 PM  Result Value Ref Range   Alcohol, Ethyl (B) <10 <10 mg/dL  Lactic acid, plasma     Status:  Abnormal   Collection Time: 08/05/21  7:25 PM  Result Value Ref Range   Lactic Acid, Venous 5.3 (HH) 0.5 - 1.9 mmol/L  Protime-INR     Status: Abnormal   Collection Time: 08/05/21  7:25 PM  Result Value Ref Range   Prothrombin Time 16.9 (H) 11.4 - 15.2 seconds   INR 1.4 (H) 0.8 - 1.2  Trauma TEG Panel     Status: None   Collection Time: 08/05/21  7:25 PM  Result Value Ref Range   Citrated Kaolin (R) 6.1 4.6 - 9.1 min   Citrated Rapid TEG (MA) 57.7 52 - 70 mm   CFF Max Amplitude 18.9 15 - 32 mm   Lysis at 30 Minutes 0 0.0 - 2.6 %  ABO/Rh     Status: None   Collection Time: 08/05/21  8:00 PM  Result Value Ref Range   ABO/RH(D)      A POS Performed at [facility], [address], [place], [place] [address]   I-Stat arterial blood gas, ED     Status: Abnormal   Collection Time: 08/05/21  8:36 PM  Result Value Ref Range   pH, Arterial 7.285 (L) 7.35 - 7.45   pCO2 arterial 64.2 (H) 32 - 48 mmHg   pO2, Arterial 78 (L) 83 - 108 mmHg   Bicarbonate 29.6 (H) 20.0 - 28.0 mmol/L   TCO2 31 22 - 32 mmol/L   O2 Saturation 90 %   Acid-Base Excess 3.0 (H) 0.0 - 2.0 mmol/L   Sodium 141 135 - 145 mmol/L   Potassium 3.9 3.5 - 5.1 mmol/L   Calcium, Ion 0.99 (L) 1.15 - 1.40 mmol/L   HCT 33.0 (L) 39.0 - 52.0 %   Hemoglobin 11.2 (L) 13.0 - 17.0 g/dL   Patient temperature 630.1 F    Sample type ARTERIAL   POCT Activated clotting time     Status: None   Collection Time: 08/05/21  9:27 PM  Result Value Ref Range   Activated Clotting Time 131 seconds  I-STAT 7, (LYTES, BLD GAS, ICA, H+H)     Status: Abnormal   Collection Time: 08/05/21 10:14 PM  Result Value Ref Range   pH, Arterial 7.343 (L) 7.35 - 7.45   pCO2 arterial 54.2 (H) 32 - 48 mmHg   pO2, Arterial 65 (L) 83 - 108 mmHg   Bicarbonate 29.2 (H) 20.0 - 28.0 mmol/L   TCO2 31  22 - 32 mmol/L   O2 Saturation 89 %   Acid-Base Excess 3.0 (H) 0.0 - 2.0 mmol/L   Sodium 144 135 - 145 mmol/L   Potassium 3.1 (L) 3.5 - 5.1 mmol/L   Calcium, Ion 1.02 (L) 1.15 - 1.40 mmol/L   HCT 30.0 (L) 39.0 - 52.0 %   Hemoglobin 10.2 (L) 13.0 - 17.0 g/dL   Patient temperature 60.1 C    Sample type ARTERIAL   POCT Activated clotting time     Status: None   Collection Time: 08/05/21 10:30 PM  Result Value Ref Range   Activated Clotting Time 317 seconds  I-STAT 7, (LYTES, BLD GAS, ICA, H+H)     Status: Abnormal   Collection Time: 08/05/21 10:43 PM  Result Value Ref Range   pH, Arterial 7.386 7.35 - 7.45   pCO2 arterial 46.0 32 - 48 mmHg   pO2, Arterial 181 (H) 83 - 108 mmHg   Bicarbonate 27.6 20.0 - 28.0 mmol/L   TCO2 29 22 - 32 mmol/L   O2 Saturation 100 %  Acid-Base Excess 2.0 0.0 - 2.0 mmol/L   Sodium 145 135 - 145 mmol/L   Potassium 3.2 (L) 3.5 - 5.1 mmol/L   Calcium, Ion 0.91 (L) 1.15 - 1.40 mmol/L   HCT 21.0 (L) 39.0 - 52.0 %   Hemoglobin 7.1 (L) 13.0 - 17.0 g/dL   Sample type ARTERIAL   POCT Activated clotting time     Status: None   Collection Time: 08/05/21 10:45 PM  Result Value Ref Range   Activated Clotting Time 305 seconds  Prepare RBC (crossmatch)     Status: None   Collection Time: 08/05/21 11:30 PM  Result Value Ref Range   Order Confirmation      BB SAMPLE OR UNITS ALREADY AVAILABLE Performed at [facility], [address] [place], [place] [address]   I-STAT 7, (LYTES, BLD GAS, ICA, H+H)     Status: Abnormal   Collection Time: 08/05/21 11:38 PM  Result Value Ref Range   pH, Arterial 7.481 (H) 7.35 - 7.45   pCO2 arterial 35.7 32 - 48 mmHg   pO2, Arterial 320 (H) 83 - 108 mmHg   Bicarbonate 26.6 20.0 - 28.0 mmol/L   TCO2 28 22 - 32 mmol/L   O2 Saturation 100 %   Acid-Base Excess 3.0 (H) 0.0 - 2.0 mmol/L   Sodium 143 135 - 145 mmol/L   Potassium 3.3 (L) 3.5 - 5.1 mmol/L   Calcium, Ion 0.93 (L) 1.15 - 1.40 mmol/L   HCT 25.0 (L) 39.0 - 52.0 %    Hemoglobin 8.5 (L) 13.0 - 17.0 g/dL   Patient temperature 91.4 C    Sample type ARTERIAL   HIV Antibody (routine testing w rflx)     Status: None   Collection Time: 08/06/21 12:28 AM  Result Value Ref Range   HIV Screen 4th Generation wRfx Non Reactive Non Reactive  CBC     Status: Abnormal   Collection Time: 08/06/21 12:28 AM  Result Value Ref Range   WBC 10.9 (H) 4.0 - 10.5 K/uL   RBC 3.51 (L) 4.22 - 5.81 MIL/uL   Hemoglobin 10.4 (L) 13.0 - 17.0 g/dL   HCT 78.2 (L) 95.6 - 21.3 %   MCV 83.5 80.0 - 100.0 fL   MCH 29.6 26.0 - 34.0 pg   MCHC 35.5 30.0 - 36.0 g/dL   RDW 08.6 57.8 - 46.9 %   Platelets 82 (L) 150 - 400 K/uL   nRBC 0.3 (H) 0.0 - 0.2 %  Basic metabolic panel     Status: Abnormal   Collection Time: 08/06/21 12:28 AM  Result Value Ref Range   Sodium 142 135 - 145 mmol/L   Potassium 3.5 3.5 - 5.1 mmol/L   Chloride 105 98 - 111 mmol/L   CO2 21 (L) 22 - 32 mmol/L   Glucose, Bld 139 (H) 70 - 99 mg/dL   BUN 19 6 - 20 mg/dL   Creatinine, Ser 6.29 (H) 0.61 - 1.24 mg/dL   Calcium 6.4 (LL) 8.9 - 10.3 mg/dL   GFR, Estimated >52 >84 mL/min   Anion gap 16 (H) 5 - 15  Lactic acid, plasma     Status: Abnormal   Collection Time: 08/06/21 12:28 AM  Result Value Ref Range   Lactic Acid, Venous 4.5 (HH) 0.5 - 1.9 mmol/L  Lactate dehydrogenase     Status: Abnormal   Collection Time: 08/06/21 12:28 AM  Result Value Ref Range   LDH 439 (H) 98 - 192 U/L  APTT  Status: Abnormal   Collection Time: 08/06/21 12:28 AM  Result Value Ref Range   aPTT >200 (HH) 24 - 36 seconds  Protime-INR     Status: Abnormal   Collection Time: 08/06/21 12:28 AM  Result Value Ref Range   Prothrombin Time 20.4 (H) 11.4 - 15.2 seconds   INR 1.8 (H) 0.8 - 1.2  Heparin level (unfractionated)     Status: Abnormal   Collection Time: 08/06/21 12:28 AM  Result Value Ref Range   Heparin Unfractionated >1.10 (H) 0.30 - 0.70 IU/mL  Fibrinogen     Status: Abnormal   Collection Time: 08/06/21 12:28 AM   Result Value Ref Range   Fibrinogen 195 (L) 210 - 475 mg/dL  Hepatic function panel     Status: Abnormal   Collection Time: 08/06/21 12:28 AM  Result Value Ref Range   Total Protein 4.3 (L) 6.5 - 8.1 g/dL   Albumin 2.7 (L) 3.5 - 5.0 g/dL   AST 604133 (H) 15 - 41 U/L   ALT 67 (H) 0 - 44 U/L   Alkaline Phosphatase 41 38 - 126 U/L   Total Bilirubin 1.9 (H) 0.3 - 1.2 mg/dL   Bilirubin, Direct 0.3 (H) 0.0 - 0.2 mg/dL   Indirect Bilirubin 1.6 (H) 0.3 - 0.9 mg/dL  Glucose, capillary     Status: Abnormal   Collection Time: 08/06/21 12:28 AM  Result Value Ref Range   Glucose-Capillary 142 (H) 70 - 99 mg/dL  I-STAT 7, (LYTES, BLD GAS, ICA, H+H)     Status: Abnormal   Collection Time: 08/06/21 12:30 AM  Result Value Ref Range   pH, Arterial 7.409 7.35 - 7.45   pCO2 arterial 37.0 32 - 48 mmHg   pO2, Arterial 75 (L) 83 - 108 mmHg   Bicarbonate 23.5 20.0 - 28.0 mmol/L   TCO2 25 22 - 32 mmol/L   O2 Saturation 95 %   Acid-base deficit 1.0 0.0 - 2.0 mmol/L   Sodium 145 135 - 145 mmol/L   Potassium 3.5 3.5 - 5.1 mmol/L   Calcium, Ion 0.92 (L) 1.15 - 1.40 mmol/L   HCT 29.0 (L) 39.0 - 52.0 %   Hemoglobin 9.9 (L) 13.0 - 17.0 g/dL   Patient temperature 54.036.7 C    Sample type ARTERIAL   MRSA Next Gen by PCR, Nasal     Status: None   Collection Time: 08/06/21  2:58 AM   Specimen: Nasal Mucosa; Nasal Swab  Result Value Ref Range   MRSA by PCR Next Gen NOT DETECTED NOT DETECTED  Urinalysis, Routine w reflex microscopic     Status: Abnormal   Collection Time: 08/06/21  3:31 AM  Result Value Ref Range   Color, Urine YELLOW YELLOW   APPearance CLEAR CLEAR   Specific Gravity, Urine 1.035 (H) 1.005 - 1.030   pH 5.0 5.0 - 8.0   Glucose, UA NEGATIVE NEGATIVE mg/dL   Hgb urine dipstick LARGE (A) NEGATIVE   Bilirubin Urine NEGATIVE NEGATIVE   Ketones, ur NEGATIVE NEGATIVE mg/dL   Protein, ur 981100 (A) NEGATIVE mg/dL   Nitrite NEGATIVE NEGATIVE   Leukocytes,Ua NEGATIVE NEGATIVE   RBC / HPF 11-20 0 - 5  RBC/hpf   WBC, UA 0-5 0 - 5 WBC/hpf   Bacteria, UA RARE (A) NONE SEEN   Squamous Epithelial / LPF 0-5 0 - 5   Mucus PRESENT   Resp Panel by RT-PCR (Flu A&B, Covid) Anterior Nasal Swab     Status: None   Collection Time: 08/06/21  3:45 AM   Specimen: Anterior Nasal Swab  Result Value Ref Range   SARS Coronavirus 2 by RT PCR NEGATIVE NEGATIVE   Influenza A by PCR NEGATIVE NEGATIVE   Influenza B by PCR NEGATIVE NEGATIVE  CBC     Status: Abnormal   Collection Time: 08/06/21  3:45 AM  Result Value Ref Range   WBC 9.5 4.0 - 10.5 K/uL   RBC 2.86 (L) 4.22 - 5.81 MIL/uL   Hemoglobin 8.6 (L) 13.0 - 17.0 g/dL   HCT 10.2 (L) 58.5 - 27.7 %   MCV 82.5 80.0 - 100.0 fL   MCH 30.1 26.0 - 34.0 pg   MCHC 36.4 (H) 30.0 - 36.0 g/dL   RDW 82.4 23.5 - 36.1 %   Platelets 69 (L) 150 - 400 K/uL   nRBC 0.0 0.0 - 0.2 %  Comprehensive metabolic panel     Status: Abnormal   Collection Time: 08/06/21  3:45 AM  Result Value Ref Range   Sodium 142 135 - 145 mmol/L   Potassium 3.4 (L) 3.5 - 5.1 mmol/L   Chloride 110 98 - 111 mmol/L   CO2 24 22 - 32 mmol/L   Glucose, Bld 135 (H) 70 - 99 mg/dL   BUN 21 (H) 6 - 20 mg/dL   Creatinine, Ser 4.43 (H) 0.61 - 1.24 mg/dL   Calcium 7.3 (L) 8.9 - 10.3 mg/dL   Total Protein 4.3 (L) 6.5 - 8.1 g/dL   Albumin 3.0 (L) 3.5 - 5.0 g/dL   AST 154 (H) 15 - 41 U/L   ALT 56 (H) 0 - 44 U/L   Alkaline Phosphatase 32 (L) 38 - 126 U/L   Total Bilirubin 1.8 (H) 0.3 - 1.2 mg/dL   GFR, Estimated >00 >86 mL/min   Anion gap 8 5 - 15  Triglycerides     Status: None   Collection Time: 08/06/21  3:45 AM  Result Value Ref Range   Triglycerides 115 <150 mg/dL  Lactic acid, plasma     Status: Abnormal   Collection Time: 08/06/21  3:45 AM  Result Value Ref Range   Lactic Acid, Venous 3.1 (HH) 0.5 - 1.9 mmol/L  Lactate dehydrogenase     Status: Abnormal   Collection Time: 08/06/21  3:45 AM  Result Value Ref Range   LDH 418 (H) 98 - 192 U/L  APTT     Status: Abnormal   Collection  Time: 08/06/21  3:45 AM  Result Value Ref Range   aPTT 69 (H) 24 - 36 seconds  Protime-INR     Status: Abnormal   Collection Time: 08/06/21  3:45 AM  Result Value Ref Range   Prothrombin Time 19.9 (H) 11.4 - 15.2 seconds   INR 1.7 (H) 0.8 - 1.2  Heparin level (unfractionated)     Status: Abnormal   Collection Time: 08/06/21  3:45 AM  Result Value Ref Range   Heparin Unfractionated <0.10 (L) 0.30 - 0.70 IU/mL  Fibrinogen     Status: Abnormal   Collection Time: 08/06/21  3:45 AM  Result Value Ref Range   Fibrinogen 183 (L) 210 - 475 mg/dL  Magnesium     Status: Abnormal   Collection Time: 08/06/21  3:45 AM  Result Value Ref Range   Magnesium 1.6 (L) 1.7 - 2.4 mg/dL  I-STAT 7, (LYTES, BLD GAS, ICA, H+H)     Status: Abnormal   Collection Time: 08/06/21  3:52 AM  Result Value Ref Range   pH, Arterial 7.452 (H) 7.35 -  7.45   pCO2 arterial 33.7 32 - 48 mmHg   pO2, Arterial 104 83 - 108 mmHg   Bicarbonate 23.5 20.0 - 28.0 mmol/L   TCO2 25 22 - 32 mmol/L   O2 Saturation 98 %   Acid-Base Excess 0.0 0.0 - 2.0 mmol/L   Sodium 145 135 - 145 mmol/L   Potassium 3.4 (L) 3.5 - 5.1 mmol/L   Calcium, Ion 1.06 (L) 1.15 - 1.40 mmol/L   HCT 22.0 (L) 39.0 - 52.0 %   Hemoglobin 7.5 (L) 13.0 - 17.0 g/dL   Patient temperature 82.9 C    Sample type ARTERIAL   Global TEG Panel     Status: Abnormal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

## 2021-08-06 NOTE — Assessment & Plan Note (Deleted)
-   To OR for wound washout and closure. - Traction pin right acetabulum - ORIF of left humerus later this week.

## 2021-08-06 NOTE — Anesthesia Postprocedure Evaluation (Addendum)
Anesthesia Post Note  Patient: Jeff [name]  Procedure(s) Performed: ECMO CANNULATION ARTERIAL LINE INSERTION     Patient location during evaluation: SICU Anesthesia Type: General Level of consciousness: sedated Pain management: pain level controlled Vital Signs Assessment: vitals unstable Respiratory status: patient remains intubated per anesthesia plan Cardiovascular status: stable Anesthetic complications: no Comments: VV ECMO   No notable events documented.  Last Vitals:  Vitals:   08/05/21 2054 08/05/21 2353  BP:  (!) 100/57  Pulse:  95  Resp:  15  Temp:    SpO2: 100% 100%    Last Pain: There were no vitals filed for this visit.               Owen Pagnotta
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
## 2021-08-06 NOTE — TOC CAGE-AID Note (Signed)
Transition of Care [facility]) - CAGE-AID Screening   Patient Details  Name: Jeff [name] MRN: [id number] Date of Birth: December 30, 2000  Transition of Care [name]) CM/SW Contact:    Katha Hamming, RN Phone Number:[phone] 08/06/2021, 2:34 AM     CAGE-AID Screening: Substance Abuse Screening unable to be completed due to: : Patient unable to participate (intubated/paralyzed, ecmo)

## 2021-08-06 NOTE — Progress Notes (Signed)
Trauma Event Note  Event Summary: Rounded on patient-- at time of assessment, patient stable, no needs from primary RN at this time, will continue to monitor.  Last imported Vital Signs BP (!) 109/57   Pulse 72   Temp 98.2 F (36.8 C)   Resp 15   Ht 5\' 8"  (1.727 m)   Wt 83.6 kg   SpO2 100%   BMI 28.02 kg/m   Trending CBC Recent Labs    08/06/21 0345 08/06/21 0352 08/06/21 0914 08/06/21 1134 08/06/21 1538 08/06/21 1539 08/06/21 1952  WBC 9.5  --  10.5  --  6.7  --   --   HGB 8.6*   < > 7.8*   < > 7.1* 6.5* 7.8*  HCT 23.6*   < > 22.0*   < > 20.1* 19.0* 23.0*  PLT 69*  --  68*  --  52*  --   --    < > = values in this interval not displayed.    Trending Coag's Recent Labs    08/05/21 1925 08/06/21 0028 08/06/21 0345 08/06/21 1538  APTT  --  >200* 69* 40*  INR 1.4* 1.8* 1.7*  --     Trending BMET Recent Labs    08/06/21 0028 08/06/21 0030 08/06/21 0345 08/06/21 0352 08/06/21 1538 08/06/21 1539 08/06/21 1952  NA 142   < > 142   < > 141 142 142  K 3.5   < > 3.4*   < > 3.6 3.6 3.6  CL 105  --  110  --  113*  --   --   CO2 21*  --  24  --  22  --   --   BUN 19  --  21*  --  16  --   --   CREATININE 1.52*  --  1.34*  --  1.03  --   --   GLUCOSE 139*  --  135*  --  133*  --   --    < > = values in this interval not displayed.      10/07/21  Trauma Response RN  Please call TRN at [phone] for further assistance.
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
## 2021-08-06 NOTE — Assessment & Plan Note (Deleted)
Considerable improvement in bilateral lung opacification. Still left posterior consolidation.   - Start to limit fluids and encourage negative fluid balance post operatively.
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
## 2021-08-06 NOTE — Progress Notes (Signed)
   08/06/21 1420  Clinical Encounter Type  Visited With Health care provider;Family;Patient not available  Visit Type Initial;Critical Care;Trauma  Referral From Chaplain Tery Sanfilippo)  Consult/Referral To Chaplain Benetta Spar)   Briefly spoke with two friends of patient that stopped to visit.  [address] [place], [place] [phone]
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
## 2021-08-06 NOTE — Progress Notes (Signed)
PT Cancellation Note  Patient Details Name: Gean Larose MRN: [id number] DOB: 18-Aug-2000   Cancelled Treatment:    Reason Eval/Treat Not Completed: Patient not medically ready. Order received via ecmo order set. Currently pt intubated, sedated, and on ecmo. Multiple fx's that will likely require surgery. Please re-order when ready.   Angelina Ok [facility] 08/06/2021, 9:40 AM Skip Mayer PT Acute [employer] [phone]

## 2021-08-07 ENCOUNTER — Inpatient Hospital Stay (HOSPITAL_COMMUNITY): Payer: Medicaid Other

## 2021-08-07 ENCOUNTER — Encounter (HOSPITAL_COMMUNITY)
Admission: EM | Disposition: A | Discharge status: Other Acute Inpatient Hospital | Payer: Self-pay | Source: Home / Self Care

## 2021-08-07 ENCOUNTER — Inpatient Hospital Stay (HOSPITAL_COMMUNITY): Payer: Medicaid Other | Admitting: Anesthesiology

## 2021-08-07 ENCOUNTER — Other Ambulatory Visit: Payer: Self-pay

## 2021-08-07 DIAGNOSIS — S32421A Displaced fracture of posterior wall of right acetabulum, initial encounter for closed fracture: Secondary | ICD-10-CM

## 2021-08-07 DIAGNOSIS — J80 Acute respiratory distress syndrome: Secondary | ICD-10-CM | POA: Diagnosis not present

## 2021-08-07 DIAGNOSIS — J9621 Acute and chronic respiratory failure with hypoxia: Secondary | ICD-10-CM

## 2021-08-07 DIAGNOSIS — S79921A Unspecified injury of right thigh, initial encounter: Secondary | ICD-10-CM

## 2021-08-07 DIAGNOSIS — J9622 Acute and chronic respiratory failure with hypercapnia: Secondary | ICD-10-CM | POA: Diagnosis not present

## 2021-08-07 DIAGNOSIS — S42402A Unspecified fracture of lower end of left humerus, initial encounter for closed fracture: Secondary | ICD-10-CM

## 2021-08-07 DIAGNOSIS — S62201B Unspecified fracture of first metacarpal bone, right hand, initial encounter for open fracture: Secondary | ICD-10-CM | POA: Diagnosis not present

## 2021-08-07 DIAGNOSIS — S2020XA Contusion of thorax, unspecified, initial encounter: Secondary | ICD-10-CM

## 2021-08-07 DIAGNOSIS — S069X0A Unspecified intracranial injury without loss of consciousness, initial encounter: Secondary | ICD-10-CM

## 2021-08-07 DIAGNOSIS — S299XXA Unspecified injury of thorax, initial encounter: Secondary | ICD-10-CM | POA: Diagnosis not present

## 2021-08-07 HISTORY — PX: INSERTION OF TRACTION PIN: SHX6560

## 2021-08-07 HISTORY — PX: I & D EXTREMITY: SHX5045

## 2021-08-07 LAB — BPAM FFP
Blood Product Expiration Date: 202307122359
Blood Product Expiration Date: 202307122359
ISSUE DATE / TIME: 202307120938
ISSUE DATE / TIME: 202307120938
Unit Type and Rh: 600
Unit Type and Rh: 6200

## 2021-08-07 LAB — PREPARE FRESH FROZEN PLASMA

## 2021-08-07 LAB — BASIC METABOLIC PANEL
Anion gap: 4 — ABNORMAL LOW (ref 5–15)
Anion gap: 5 (ref 5–15)
BUN: 15 mg/dL (ref 6–20)
BUN: 15 mg/dL (ref 6–20)
CO2: 21 mmol/L — ABNORMAL LOW (ref 22–32)
CO2: 21 mmol/L — ABNORMAL LOW (ref 22–32)
Calcium: 7.4 mg/dL — ABNORMAL LOW (ref 8.9–10.3)
Calcium: 7.9 mg/dL — ABNORMAL LOW (ref 8.9–10.3)
Chloride: 112 mmol/L — ABNORMAL HIGH (ref 98–111)
Chloride: 118 mmol/L — ABNORMAL HIGH (ref 98–111)
Creatinine, Ser: 0.86 mg/dL (ref 0.61–1.24)
Creatinine, Ser: 1.02 mg/dL (ref 0.61–1.24)
GFR, Estimated: >60 mL/min (ref >60)
GFR, Estimated: >60 mL/min (ref >60)
Glucose, Bld: 125 mg/dL — ABNORMAL HIGH (ref 70–99)
Glucose, Bld: 109 mg/dL — ABNORMAL HIGH (ref 70–99)
Potassium: 3.8 mmol/L (ref 3.5–5.1)
Potassium: 4.2 mmol/L (ref 3.5–5.1)
Sodium: 137 mmol/L (ref 135–145)
Sodium: 144 mmol/L (ref 135–145)

## 2021-08-07 LAB — POCT I-STAT 7, (LYTES, BLD GAS, ICA,H+H)
Acid-base deficit: 3 mmol/L — ABNORMAL HIGH (ref 0.0–2.0)
Acid-base deficit: 4 mmol/L — ABNORMAL HIGH (ref 0.0–2.0)
Acid-base deficit: 2 mmol/L (ref 0.0–2.0)
Acid-base deficit: 4 mmol/L — ABNORMAL HIGH (ref 0.0–2.0)
Acid-base deficit: 4 mmol/L — ABNORMAL HIGH (ref 0.0–2.0)
Acid-base deficit: 5 mmol/L — ABNORMAL HIGH (ref 0.0–2.0)
Bicarbonate: 21.6 mmol/L (ref 20.0–28.0)
Bicarbonate: 21.8 mmol/L (ref 20.0–28.0)
Bicarbonate: 23.1 mmol/L (ref 20.0–28.0)
Bicarbonate: 20.6 mmol/L (ref 20.0–28.0)
Bicarbonate: 20.1 mmol/L (ref 20.0–28.0)
Bicarbonate: 21.1 mmol/L (ref 20.0–28.0)
Calcium, Ion: 1.14 mmol/L — ABNORMAL LOW (ref 1.15–1.40)
Calcium, Ion: 1.2 mmol/L (ref 1.15–1.40)
Calcium, Ion: 1.2 mmol/L (ref 1.15–1.40)
Calcium, Ion: 1.24 mmol/L (ref 1.15–1.40)
Calcium, Ion: 1.22 mmol/L (ref 1.15–1.40)
Calcium, Ion: 1.22 mmol/L (ref 1.15–1.40)
HCT: 23 % — ABNORMAL LOW (ref 39.0–52.0)
HCT: 23 % — ABNORMAL LOW (ref 39.0–52.0)
HCT: 23 % — ABNORMAL LOW (ref 39.0–52.0)
HCT: 22 % — ABNORMAL LOW (ref 39.0–52.0)
HCT: 22 % — ABNORMAL LOW (ref 39.0–52.0)
HCT: 21 % — ABNORMAL LOW (ref 39.0–52.0)
Hemoglobin: 7.8 g/dL — ABNORMAL LOW (ref 13.0–17.0)
Hemoglobin: 7.8 g/dL — ABNORMAL LOW (ref 13.0–17.0)
Hemoglobin: 7.8 g/dL — ABNORMAL LOW (ref 13.0–17.0)
Hemoglobin: 7.5 g/dL — ABNORMAL LOW (ref 13.0–17.0)
Hemoglobin: 7.5 g/dL — ABNORMAL LOW (ref 13.0–17.0)
Hemoglobin: 7.1 g/dL — ABNORMAL LOW (ref 13.0–17.0)
O2 Saturation: 99 %
O2 Saturation: 98 %
O2 Saturation: 100 %
O2 Saturation: 98 %
O2 Saturation: 98 %
O2 Saturation: 93 %
Patient temperature: 36.8
Patient temperature: 36.8
Patient temperature: 36.7
Patient temperature: 36.8
Patient temperature: 37.1
Potassium: 3.6 mmol/L (ref 3.5–5.1)
Potassium: 4 mmol/L (ref 3.5–5.1)
Potassium: 4.1 mmol/L (ref 3.5–5.1)
Potassium: 4.2 mmol/L (ref 3.5–5.1)
Potassium: 4.3 mmol/L (ref 3.5–5.1)
Potassium: 4.2 mmol/L (ref 3.5–5.1)
Sodium: 142 mmol/L (ref 135–145)
Sodium: 141 mmol/L (ref 135–145)
Sodium: 141 mmol/L (ref 135–145)
Sodium: 146 mmol/L — ABNORMAL HIGH (ref 135–145)
Sodium: 149 mmol/L — ABNORMAL HIGH (ref 135–145)
Sodium: 148 mmol/L — ABNORMAL HIGH (ref 135–145)
TCO2: 23 mmol/L (ref 22–32)
TCO2: 23 mmol/L (ref 22–32)
TCO2: 24 mmol/L (ref 22–32)
TCO2: 22 mmol/L (ref 22–32)
TCO2: 21 mmol/L — ABNORMAL LOW (ref 22–32)
TCO2: 22 mmol/L (ref 22–32)
pCO2 arterial: 32.8 mmHg (ref 32–48)
pCO2 arterial: 39.3 mmHg (ref 32–48)
pCO2 arterial: 38.8 mmHg (ref 32–48)
pCO2 arterial: 33.9 mmHg (ref 32–48)
pCO2 arterial: 31.2 mmHg — ABNORMAL LOW (ref 32–48)
pCO2 arterial: 40.7 mmHg (ref 32–48)
pH, Arterial: 7.425 (ref 7.35–7.45)
pH, Arterial: 7.351 (ref 7.35–7.45)
pH, Arterial: 7.382 (ref 7.35–7.45)
pH, Arterial: 7.392 (ref 7.35–7.45)
pH, Arterial: 7.418 (ref 7.35–7.45)
pH, Arterial: 7.324 — ABNORMAL LOW (ref 7.35–7.45)
pO2, Arterial: 115 mmHg — ABNORMAL HIGH (ref 83–108)
pO2, Arterial: 114 mmHg — ABNORMAL HIGH (ref 83–108)
pO2, Arterial: 244 mmHg — ABNORMAL HIGH (ref 83–108)
pO2, Arterial: 96 mmHg (ref 83–108)
pO2, Arterial: 107 mmHg (ref 83–108)
pO2, Arterial: 72 mmHg — ABNORMAL LOW (ref 83–108)

## 2021-08-07 LAB — GLOBAL TEG PANEL
CFF Max Amplitude: 14.6 mm — ABNORMAL LOW (ref 15–32)
CK with Heparinase (R): 6.5 min (ref 4.3–8.3)
Citrated Functional Fibrinogen: 266.4 mg/dL — ABNORMAL LOW (ref 278–581)
Citrated Kaolin (K): 2.3 min — ABNORMAL HIGH (ref 0.8–2.1)
Citrated Kaolin (MA): 49.5 mm — ABNORMAL LOW (ref 52–69)
Citrated Kaolin (R): 5.8 min (ref 4.6–9.1)
Citrated Kaolin Angle: 70.1 deg (ref 63–78)
Citrated Rapid TEG (MA): 46.5 mm — ABNORMAL LOW (ref 52–70)

## 2021-08-07 LAB — CBC
HCT: 25.2 % — ABNORMAL LOW (ref 39.0–52.0)
HCT: 24.1 % — ABNORMAL LOW (ref 39.0–52.0)
Hemoglobin: 8.7 g/dL — ABNORMAL LOW (ref 13.0–17.0)
Hemoglobin: 8.3 g/dL — ABNORMAL LOW (ref 13.0–17.0)
MCH: 29.5 pg (ref 26.0–34.0)
MCH: 29.6 pg (ref 26.0–34.0)
MCHC: 34.5 g/dL (ref 30.0–36.0)
MCHC: 34.4 g/dL (ref 30.0–36.0)
MCV: 85.4 fL (ref 80.0–100.0)
MCV: 86.1 fL (ref 80.0–100.0)
Platelets: 46 10*3/uL — ABNORMAL LOW (ref 150–400)
Platelets: 57 10*3/uL — ABNORMAL LOW (ref 150–400)
RBC: 2.95 MIL/uL — ABNORMAL LOW (ref 4.22–5.81)
RBC: 2.8 MIL/uL — ABNORMAL LOW (ref 4.22–5.81)
RDW: 16 % — ABNORMAL HIGH (ref 11.5–15.5)
RDW: 16.1 % — ABNORMAL HIGH (ref 11.5–15.5)
WBC: 6.2 10*3/uL (ref 4.0–10.5)
WBC: 6.2 10*3/uL (ref 4.0–10.5)
nRBC: 0 % (ref 0.0–0.2)
nRBC: 0 % (ref 0.0–0.2)

## 2021-08-07 LAB — APTT
aPTT: 40 seconds — ABNORMAL HIGH (ref 24–36)
aPTT: 38 seconds — ABNORMAL HIGH (ref 24–36)

## 2021-08-07 LAB — LACTATE DEHYDROGENASE: LDH: 345 U/L — ABNORMAL HIGH (ref 98–192)

## 2021-08-07 LAB — FIBRINOGEN: Fibrinogen: 312 mg/dL (ref 210–475)

## 2021-08-07 LAB — PROTIME-INR
INR: 1.8 — ABNORMAL HIGH (ref 0.8–1.2)
Prothrombin Time: 21.1 seconds — ABNORMAL HIGH (ref 11.4–15.2)

## 2021-08-07 LAB — HEPATIC FUNCTION PANEL
ALT: 43 U/L (ref 0–44)
AST: 94 U/L — ABNORMAL HIGH (ref 15–41)
Albumin: 2.9 g/dL — ABNORMAL LOW (ref 3.5–5.0)
Alkaline Phosphatase: 31 U/L — ABNORMAL LOW (ref 38–126)
Bilirubin, Direct: 0.4 mg/dL — ABNORMAL HIGH (ref 0.0–0.2)
Indirect Bilirubin: 1 mg/dL — ABNORMAL HIGH (ref 0.3–0.9)
Total Bilirubin: 1.4 mg/dL — ABNORMAL HIGH (ref 0.3–1.2)
Total Protein: 4.3 g/dL — ABNORMAL LOW (ref 6.5–8.1)

## 2021-08-07 LAB — PHOSPHORUS
Phosphorus: 2.9 mg/dL (ref 2.5–4.6)
Phosphorus: 1.9 mg/dL — ABNORMAL LOW (ref 2.5–4.6)

## 2021-08-07 LAB — MAGNESIUM
Magnesium: 2.2 mg/dL (ref 1.7–2.4)
Magnesium: 2.1 mg/dL (ref 1.7–2.4)

## 2021-08-07 LAB — LACTIC ACID, PLASMA: Lactic Acid, Venous: 1.4 mmol/L (ref 0.5–1.9)

## 2021-08-07 SURGERY — IRRIGATION AND DEBRIDEMENT EXTREMITY
Anesthesia: General | Site: Leg Upper | Laterality: Right

## 2021-08-07 MED ORDER — SODIUM CHLORIDE 0.9 % IR SOLN
Status: DC | PRN
  Administered 2021-08-07 (×2): 3000 mL

## 2021-08-07 MED ORDER — ORAL CARE MOUTH RINSE: 15.0000 mL | OROMUCOSAL | Status: DC | PRN

## 2021-08-07 MED ORDER — GABAPENTIN 250 MG/5ML PO SOLN
400.0000 mg | Freq: Two times a day (BID) | ORAL | Status: DC
Start: 2021-08-07 — End: 2021-08-22
  Administered 2021-08-07 – 2021-08-21 (×28): 400 mg
  Filled 2021-08-07 (×35): qty 8

## 2021-08-07 MED ORDER — DEXMEDETOMIDINE HCL IN NACL 400 MCG/100ML IV SOLN
INTRAVENOUS | Status: DC | PRN
  Administered 2021-08-07: .7 ug/kg/h via INTRAVENOUS

## 2021-08-07 MED ORDER — CLONAZEPAM 1 MG PO TABS
3.0000 mg | ORAL_TABLET | Freq: Two times a day (BID) | ORAL | Status: DC
  Administered 2021-08-07 – 2021-08-13 (×12): 3 mg
  Filled 2021-08-07 (×13): qty 3

## 2021-08-07 MED ORDER — METHOCARBAMOL 1000 MG/10ML IJ SOLN
1000.0000 mg | Freq: Three times a day (TID) | INTRAVENOUS | Status: DC
  Filled 2021-08-07: qty 10

## 2021-08-07 MED ORDER — SODIUM CHLORIDE 0.9 % IV SOLN: INTRAVENOUS | Status: DC | PRN

## 2021-08-07 MED ORDER — SODIUM BICARBONATE 8.4 % IV SOLN
INTRAVENOUS | Status: AC
  Filled 2021-08-07: qty 100

## 2021-08-07 MED ORDER — SODIUM CHLORIDE 0.9% IV SOLUTION: Freq: Once | INTRAVENOUS | Status: AC

## 2021-08-07 MED ORDER — ALBUMIN HUMAN 5 % IV SOLN: INTRAVENOUS | Status: DC | PRN

## 2021-08-07 MED ORDER — FENTANYL CITRATE (PF) 100 MCG/2ML IJ SOLN
INTRAMUSCULAR | Status: DC | PRN
  Administered 2021-08-07: 100 ug via INTRAVENOUS
  Administered 2021-08-07: 50 ug via INTRAVENOUS
  Administered 2021-08-07: 100 ug via INTRAVENOUS

## 2021-08-07 MED ORDER — ASCORBIC ACID 500 MG PO TABS
500.0000 mg | ORAL_TABLET | Freq: Two times a day (BID) | ORAL | Status: DC
  Administered 2021-08-07 – 2021-08-13 (×12): 500 mg
  Filled 2021-08-07 (×14): qty 1

## 2021-08-07 MED ORDER — LACTATED RINGERS IV SOLN: INTRAVENOUS | Status: DC | PRN

## 2021-08-07 MED ORDER — ROCURONIUM BROMIDE 100 MG/10ML IV SOLN
INTRAVENOUS | Status: DC | PRN
  Administered 2021-08-07 (×2): 50 mg via INTRAVENOUS

## 2021-08-07 MED ORDER — 0.9 % SODIUM CHLORIDE (POUR BTL) OPTIME
TOPICAL | Status: DC | PRN
  Administered 2021-08-07: 1000 mL

## 2021-08-07 MED ORDER — FENTANYL CITRATE (PF) 250 MCG/5ML IJ SOLN
INTRAMUSCULAR | Status: AC
  Filled 2021-08-07: qty 5

## 2021-08-07 MED ORDER — VANCOMYCIN HCL 1000 MG IV SOLR
INTRAVENOUS | Status: AC
  Filled 2021-08-07: qty 40

## 2021-08-07 MED ORDER — ATROPINE SULFATE 1 MG/10ML IJ SOSY
PREFILLED_SYRINGE | INTRAMUSCULAR | Status: AC
  Filled 2021-08-07: qty 10

## 2021-08-07 MED ORDER — VANCOMYCIN HCL 1000 MG IV SOLR
INTRAVENOUS | Status: DC | PRN
  Administered 2021-08-07 (×2): 1000 mg

## 2021-08-07 MED ORDER — ACETAMINOPHEN 325 MG PO TABS
650.0000 mg | ORAL_TABLET | Freq: Four times a day (QID) | ORAL | Status: DC
  Administered 2021-08-07 – 2021-08-08 (×3): 650 mg
  Filled 2021-08-07 (×3): qty 2

## 2021-08-07 MED ORDER — GABAPENTIN 800 MG PO TABS: 400.0000 mg | ORAL_TABLET | Freq: Two times a day (BID) | ORAL | Status: DC

## 2021-08-07 MED ORDER — ZINC SULFATE 220 (50 ZN) MG PO CAPS
220.0000 mg | ORAL_CAPSULE | Freq: Every day | ORAL | Status: DC
  Administered 2021-08-07 – 2021-08-13 (×6): 220 mg
  Filled 2021-08-07 (×7): qty 1

## 2021-08-07 MED ORDER — PHENYLEPHRINE 80 MCG/ML (10ML) SYRINGE FOR IV PUSH (FOR BLOOD PRESSURE SUPPORT)
PREFILLED_SYRINGE | INTRAVENOUS | Status: DC | PRN
  Administered 2021-08-07 (×2): 80 ug via INTRAVENOUS

## 2021-08-07 MED ORDER — TOBRAMYCIN SULFATE 1.2 G IJ SOLR
INTRAMUSCULAR | Status: AC
  Filled 2021-08-07: qty 2.4

## 2021-08-07 MED ORDER — EPINEPHRINE 1 MG/10ML IJ SOSY
PREFILLED_SYRINGE | INTRAMUSCULAR | Status: AC
  Filled 2021-08-07: qty 10

## 2021-08-07 MED ORDER — TOBRAMYCIN SULFATE 1.2 G IJ SOLR
INTRAMUSCULAR | Status: DC | PRN
  Administered 2021-08-07 (×2): 1.2 g

## 2021-08-07 MED ORDER — ORAL CARE MOUTH RINSE
15.0000 mL | OROMUCOSAL | Status: DC
  Administered 2021-08-07 – 2021-08-22 (×176): 15 mL via OROMUCOSAL

## 2021-08-07 MED ORDER — PIVOT 1.5 CAL PO LIQD
1000.0000 mL | ORAL | Status: DC
  Administered 2021-08-08: 1000 mL

## 2021-08-07 MED ORDER — SODIUM PHOSPHATES 45 MMOLE/15ML IV SOLN
20.0000 mmol | Freq: Once | INTRAVENOUS | Status: AC
  Administered 2021-08-07: 20 mmol via INTRAVENOUS
  Filled 2021-08-07: qty 6.67

## 2021-08-07 SURGICAL SUPPLY — 62 items
APL PRP STRL LF DISP 70% ISPRP (MISCELLANEOUS)
BAG COUNTER SPONGE SURGICOUNT (BAG) ×3 IMPLANT
BAG SPNG CNTER NS LX DISP (BAG) ×2
BNDG COHESIVE 4X5 TAN ST LF (GAUZE/BANDAGES/DRESSINGS) ×1 IMPLANT
BNDG ELASTIC 2X5.8 VLCR STR LF (GAUZE/BANDAGES/DRESSINGS) ×1 IMPLANT
BNDG ELASTIC 3X5.8 VLCR STR LF (GAUZE/BANDAGES/DRESSINGS) ×1 IMPLANT
BNDG ELASTIC 4X5.8 VLCR STR LF (GAUZE/BANDAGES/DRESSINGS) ×2 IMPLANT
BNDG GAUZE DERMACEA FLUFF (GAUZE/BANDAGES/DRESSINGS) ×3
BNDG GAUZE DERMACEA FLUFF 4 (GAUZE/BANDAGES/DRESSINGS) IMPLANT
BNDG GAUZE ELAST 4 BULKY (GAUZE/BANDAGES/DRESSINGS) IMPLANT
BNDG GZE DERMACEA 4 6PLY (GAUZE/BANDAGES/DRESSINGS) ×2
CANISTER SUCT 3000ML PPV (MISCELLANEOUS) ×2 IMPLANT
CANISTER WOUNDNEG PRESSURE 500 (CANNISTER) ×1 IMPLANT
CHLORAPREP W/TINT 26 (MISCELLANEOUS) ×2 IMPLANT
COVER SURGICAL LIGHT HANDLE (MISCELLANEOUS) ×3 IMPLANT
DRAPE EXTREMITY T 121X128X90 (DISPOSABLE) ×1 IMPLANT
DRAPE HALF SHEET 40X57 (DRAPES) ×7 IMPLANT
DRESSING VERAFLO CLEANSE CC (GAUZE/BANDAGES/DRESSINGS) IMPLANT
DRSG MEPITEL 4X7.2 (GAUZE/BANDAGES/DRESSINGS) ×1 IMPLANT
DRSG VERAFLO CLEANSE CC (GAUZE/BANDAGES/DRESSINGS) ×6
ELECT REM PT RETURN 9FT ADLT (ELECTROSURGICAL) ×3
ELECTRODE REM PT RTRN 9FT ADLT (ELECTROSURGICAL) ×2 IMPLANT
GAUZE SPONGE 4X4 12PLY STRL (GAUZE/BANDAGES/DRESSINGS) ×3 IMPLANT
GAUZE SPONGE 4X4 12PLY STRL LF (GAUZE/BANDAGES/DRESSINGS) ×1 IMPLANT
GLOVE BIO SURGEON STRL SZ 6.5 (GLOVE) ×4 IMPLANT
GLOVE BIO SURGEON STRL SZ7.5 (GLOVE) ×4 IMPLANT
GLOVE BIO SURGEON STRL SZ8 (GLOVE) ×3 IMPLANT
GLOVE BIOGEL PI IND STRL 6.5 (GLOVE) IMPLANT
GLOVE BIOGEL PI IND STRL 7.5 (GLOVE) IMPLANT
GLOVE BIOGEL PI IND STRL 8 (GLOVE) ×2 IMPLANT
GLOVE BIOGEL PI INDICATOR 6.5 (GLOVE) ×6
GLOVE BIOGEL PI INDICATOR 7.5 (GLOVE) ×9
GLOVE BIOGEL PI INDICATOR 8 (GLOVE)
GOWN STRL REUS W/ TWL LRG LVL3 (GOWN DISPOSABLE) ×2 IMPLANT
GOWN STRL REUS W/ TWL XL LVL3 (GOWN DISPOSABLE) ×2 IMPLANT
GOWN STRL REUS W/TWL LRG LVL3 (GOWN DISPOSABLE) ×12
GOWN STRL REUS W/TWL XL LVL3 (GOWN DISPOSABLE)
KIT BASIN OR (CUSTOM PROCEDURE TRAY) ×3 IMPLANT
KIT TURNOVER KIT B (KITS) ×3 IMPLANT
MARKER SKIN DUAL TIP RULER LAB (MISCELLANEOUS) ×3 IMPLANT
NS IRRIG 1000ML POUR BTL (IV SOLUTION) ×3 IMPLANT
PACK GENERAL/GYN (CUSTOM PROCEDURE TRAY) ×3 IMPLANT
PAD ABD 8X10 STRL (GAUZE/BANDAGES/DRESSINGS) ×1 IMPLANT
PAD ARMBOARD 7.5X6 YLW CONV (MISCELLANEOUS) ×6 IMPLANT
PAD NEG PRESSURE SENSATRAC (MISCELLANEOUS) ×1 IMPLANT
PADDING CAST ABS 4INX4YD NS (CAST SUPPLIES) ×2
PADDING CAST ABS COTTON 4X4 ST (CAST SUPPLIES) IMPLANT
PADDING CAST COTTON 6X4 STRL (CAST SUPPLIES) ×1 IMPLANT
PADDING UNDERCAST 2 STRL (CAST SUPPLIES) ×1
PADDING UNDERCAST 2X4 STRL (CAST SUPPLIES) IMPLANT
PENCIL SMOKE EVACUATOR (MISCELLANEOUS) ×2 IMPLANT
SPLINT PLASTER CAST XFAST 5X30 (CAST SUPPLIES) IMPLANT
SPLINT PLASTER XFAST SET 5X30 (CAST SUPPLIES) ×3
STOCKINETTE IMPERVIOUS 9X36 MD (GAUZE/BANDAGES/DRESSINGS) ×3 IMPLANT
SUT ETHILON 2 0 PSLX (SUTURE) ×3 IMPLANT
SUT ETHILON 3 0 PS 1 (SUTURE) ×4 IMPLANT
SUT MON AB 2-0 CT1 36 (SUTURE) ×3 IMPLANT
SUT PDS AB 0 CT1 27 (SUTURE) ×1 IMPLANT
TAPE CLOTH SURG 4X10 WHT LF (GAUZE/BANDAGES/DRESSINGS) ×3 IMPLANT
TOWEL GREEN STERILE (TOWEL DISPOSABLE) ×3 IMPLANT
TOWEL GREEN STERILE FF (TOWEL DISPOSABLE) ×4 IMPLANT
UNDERPAD 30X36 HEAVY ABSORB (UNDERPADS AND DIAPERS) ×5 IMPLANT
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
## 2021-08-07 NOTE — Progress Notes (Signed)
OT Cancellation Note  Patient Details Name: Froilan Mclean MRN: [id number] DOB: 12-06-2000   Cancelled Treatment:    Reason Eval/Treat Not Completed: Other (comment).  Going for surgery, will proceed as appropriate.    Esti Demello D Breshae Belcher 08/07/2021, 8:49 AM

## 2021-08-07 NOTE — Progress Notes (Signed)
Orthopedic Tech Progress Note Patient Details:  Jeff [name] [birth year] [id number]  Ortho Devices Type of Ortho Device: Long arm splint Ortho Device/Splint Location: BLE Ortho Device/Splint Interventions: Application   Post Interventions Patient Tolerated: Well Instructions Provided: Care of device  Jeff [name] [name] 08/07/2021, 1:07 PM

## 2021-08-07 NOTE — Progress Notes (Signed)
Patient ID: Jeff [name], male   DOB: December 19, 2000, 21 y.o.   MRN: [id number] Follow up - Trauma Critical Care   Patient Details:    Jeff [name] is an 21 y.o. male.  Lines/tubes : Airway 7.5 mm (Active)  Secured at (cm) 25 cm 08/07/21 0733  Measured From Lips 08/07/21 0733  Secured Location Center 08/07/21 0733  Secured By [employer] 08/07/21 0733  Tube Holder Repositioned Yes 08/07/21 0733  Prone position No 08/07/21 0733  Cuff Pressure (cm H2O) Green OR 18-26 CmH2O 08/07/21 0733  Site Condition Dry 08/07/21 0733     CVC Triple Lumen 08/05/21 (Active)  Indication for Insertion or Continuance of Line Vasoactive infusions 08/06/21 2100  Site Assessment Bleeding 08/06/21 2100  Proximal Lumen Status Flushed 08/06/21 2100  Medial Lumen Status Flushed 08/06/21 2100  Distal Lumen Status Flushed 08/06/21 2100  Dressing Type Transparent 08/06/21 2100  Dressing Status Old drainage 08/06/21 2100  Line Care Connections checked and tightened 08/06/21 2100  Dressing Intervention Other (Comment) 08/06/21 2100  Dressing Change Due 08/12/21 08/06/21 2100     Arterial Line 08/05/21 Left Femoral (Active)  Site Assessment Bleeding 08/06/21 2100  Line Status Pulsatile blood flow 08/06/21 2100  Art Line Waveform Appropriate 08/06/21 2100  Art Line Interventions Zeroed and calibrated 08/06/21 2100  Color/Movement/Sensation Capillary refill less than 3 sec 08/06/21 2100  Dressing Type Transparent 08/06/21 2100  Dressing Status Old drainage 08/06/21 2100  Interventions Other (Comment) 08/06/21 2100  Dressing Change Due 08/12/21 08/06/21 2100     Chest Tube 1 Lateral;Left Pleural (Active)  Status -20 cm H2O 08/06/21 2000  Chest Tube Air Leak None 08/06/21 2000  Patency Intervention Tip/tilt 08/06/21 2000  Drainage Description Sanguineous 08/06/21 2000  Dressing Status Clean, Dry, Intact 08/06/21 2000  Dressing Intervention Dressing changed 08/06/21 1500  Site  Assessment Bleeding;Pink 08/06/21 1500  Surrounding Skin Reddened 08/06/21 1500  Output (mL) 20 mL 08/07/21 0800     NG/OG Vented/Dual Lumen 18 Fr. Oral (Active)  Tube Position (Required) External length of tube 08/06/21 2100  Ongoing Placement Verification (Required) (See row information) Yes 08/06/21 2100  Site Assessment Clean, Dry, Intact 08/06/21 2100  Interventions X-ray 08/06/21 2100  Status Low continuous suction 08/06/21 2100  Amount of suction 50 mmHg 08/06/21 0100  Drainage Appearance Brown;Bloody 08/06/21 0730  Intake (mL) 10 mL 08/06/21 2000  Output (mL) 0 mL 08/06/21 2000     Urethral Catheter [name], RN Temperature probe 16 Fr. (Active)  Indication for Insertion or Continuance of Catheter Unstable critically ill patients first 24-48 hours (See Criteria) 08/06/21 2000  Site Assessment Clean, Dry, Intact 08/06/21 2000  Catheter Maintenance Bag below level of bladder 08/06/21 2000  Collection Container Standard drainage bag 08/06/21 2000  Securement Method Securing device (Describe) 08/06/21 2000  Urinary Catheter Interventions (if applicable) Unclamped 08/06/21 2000  Output (mL) 100 mL 08/07/21 0800    Microbiology/Sepsis markers: Results for orders placed or performed during the hospital encounter of 08/05/21  MRSA Next Gen by PCR, Nasal     Status: None   Collection Time: 08/06/21  2:58 AM   Specimen: Nasal Mucosa; Nasal Swab  Result Value Ref Range Status   MRSA by PCR Next Gen NOT DETECTED NOT DETECTED Final    Comment: (NOTE) The GeneXpert MRSA Assay (FDA approved for NASAL specimens only), is one component of a comprehensive MRSA colonization surveillance program. It is not intended to diagnose MRSA infection nor to guide or monitor treatment for MRSA  infections. Test performance is not FDA approved in patients less than 20 years old. Performed at [facility], [address] [place], [place] [address]   Resp Panel by RT-PCR (Flu A&B, Covid) Anterior  Nasal Swab     Status: None   Collection Time: 08/06/21  3:45 AM   Specimen: Anterior Nasal Swab  Result Value Ref Range Status   SARS Coronavirus 2 by RT PCR NEGATIVE NEGATIVE Final    Comment: (NOTE) SARS-CoV-2 target nucleic acids are NOT DETECTED.  The SARS-CoV-2 RNA is generally detectable in upper respiratory specimens during the acute phase of infection. The lowest concentration of SARS-CoV-2 viral copies this assay can detect is 138 copies/mL. A negative result does not preclude SARS-Cov-2 infection and should not be used as the sole basis for treatment or other patient management decisions. A negative result may occur with  improper specimen collection/handling, submission of specimen other than nasopharyngeal swab, presence of viral mutation(s) within the areas targeted by this assay, and inadequate number of viral copies(<138 copies/mL). A negative result must be combined with clinical observations, patient history, and epidemiological information. The expected result is Negative.  Fact Sheet for Patients:  BloggerCourse.comhttps://www.fda.gov/media/152166/download  Fact Sheet for Healthcare Providers:  SeriousBroker.ithttps://www.fda.gov/media/152162/download  This test is no t yet approved or cleared by the [place] FDA and  has been authorized for detection and/or diagnosis of SARS-CoV-2 by FDA under an Emergency Use Authorization (EUA). This EUA will remain  in effect (meaning this test can be used) for the duration of the COVID-19 declaration under Section 564(b)(1) of the Act, 21 U.S.C.section 360bbb-3(b)(1), unless the authorization is terminated  or revoked sooner.       Influenza A by PCR NEGATIVE NEGATIVE Final   Influenza B by PCR NEGATIVE NEGATIVE Final    Comment: (NOTE) The Xpert Xpress SARS-CoV-2/FLU/RSV plus assay is intended as an aid in the diagnosis of influenza from Nasopharyngeal swab specimens and should not be used as a sole basis for treatment. Nasal washings  and aspirates are unacceptable for Xpert Xpress SARS-CoV-2/FLU/RSV testing.  Fact Sheet for Patients: BloggerCourse.comhttps://www.fda.gov/media/152166/download  Fact Sheet for Healthcare Providers: SeriousBroker.ithttps://www.fda.gov/media/152162/download  This test is not yet approved or cleared by the [place] FDA and has been authorized for detection and/or diagnosis of SARS-CoV-2 by FDA under an Emergency Use Authorization (EUA). This EUA will remain in effect (meaning this test can be used) for the duration of the COVID-19 declaration under Section 564(b)(1) of the Act, 21 U.S.C. section 360bbb-3(b)(1), unless the authorization is terminated or revoked.  Performed at [facility], [address] [place], [place] [address]     Anti-infectives:  Anti-infectives (From admission, onward)    Start     Dose/Rate Route Frequency Ordered Stop   08/06/21 1000  vancomycin (VANCOREADY) IVPB 750 mg/150 mL  Status:  Discontinued        750 mg 150 mL/hr over 60 Minutes Intravenous Every 12 hours 08/05/21 2229 08/05/21 2313   08/06/21 0600  meropenem (MERREM) 1 g in sodium chloride 0.9 % 100 mL IVPB        1 g 200 mL/hr over 30 Minutes Intravenous Every 8 hours 08/05/21 2352     08/06/21 0015  vancomycin (VANCOREADY) IVPB 750 mg/150 mL        750 mg 150 mL/hr over 60 Minutes Intravenous  Once 08/05/21 2313 08/06/21 0208   08/05/21 2317  vancomycin variable dose per unstable renal function (pharmacist dosing)  Status:  Discontinued  Does not apply See admin instructions 08/05/21 2317 08/06/21 0917   08/05/21 2215  Ampicillin-Sulbactam (UNASYN) 3 g in sodium chloride 0.9 % 100 mL IVPB  Status:  Discontinued        3 g 200 mL/hr over 30 Minutes Intravenous Every 6 hours 08/05/21 2209 08/05/21 2351   08/05/21 2215  vancomycin (VANCOCIN) IVPB 1000 mg/200 mL premix        1,000 mg 200 mL/hr over 60 Minutes Intravenous  Once 08/05/21 2213 08/05/21 2217   08/05/21 1815  ceFAZolin (ANCEF) IVPB 2g/100 mL  premix        2 g 200 mL/hr over 30 Minutes Intravenous NOW 08/05/21 1801 08/05/21 1936       Consults: Treatment Team:  Jadene Pierini, MD Haddix, Gillie Manners, MD Samson Frederic, MD    Subjective:    Overnight Issues:   Objective:  Vital signs for last 24 hours: Temp:  [97.7 F (36.5 C)-98.2 F (36.8 C)] 98.2 F (36.8 C) (07/13 0600) Pulse Rate:  [68-87] 72 (07/13 0733) Resp:  [0-39] 15 (07/13 0733) BP: (108-137)/(49-64) 109/57 (07/12 2319) SpO2:  [93 %-100 %] 98 % (07/13 0600) Arterial Line BP: (96-143)/(48-66) 108/57 (07/13 0600) FiO2 (%):  [50 %] 50 % (07/13 0733) Weight:  [90 kg] 90 kg (07/13 0423)  Hemodynamic parameters for last 24 hours:    Intake/Output from previous day: 07/12 0701 - 07/13 0700 In: 5996.5 [I.V.:2753.8; Blood:1075; NG/GT:10; IV Piggyback:2157.8] Out: 2215 [Urine:1635; Emesis/NG output:300; Chest Tube:280]  Intake/Output this shift: Total I/O In: -  Out: 120 [Urine:100; Chest Tube:20]  Vent settings for last 24 hours: Vent Mode: PCV FiO2 (%):  [50 %] 50 % Set Rate:  [15 bmp] 15 bmp PEEP:  [12 cmH20] 12 cmH20 Plateau Pressure:  [23 cmH20-24 cmH20] 23 cmH20  Physical Exam:  General: sedated Neuro: disconj gaze pupils 3mm HEENT/Neck: ETT and ECMO Resp: few rhonchi L CVS: RRR GI: soft, mild dist, nt Extremities: LUE dressed R thigh dressed, RUE dressed mild edema  Results for orders placed or performed during the hospital encounter of 08/05/21 (from the past 24 hour(s))  CBC with Differential/Platelet     Status: Abnormal   Collection Time: 08/06/21  9:14 AM  Result Value Ref Range   WBC 10.5 4.0 - 10.5 K/uL   RBC 2.61 (L) 4.22 - 5.81 MIL/uL   Hemoglobin 7.8 (L) 13.0 - 17.0 g/dL   HCT 16.1 (L) 09.6 - 04.5 %   MCV 84.3 80.0 - 100.0 fL   MCH 29.9 26.0 - 34.0 pg   MCHC 35.5 30.0 - 36.0 g/dL   RDW 40.9 81.1 - 91.4 %   Platelets 68 (L) 150 - 400 K/uL   nRBC 0.0 0.0 - 0.2 %   Neutrophils Relative % 73 %   Neutro Abs 7.7 1.7 -  7.7 K/uL   Lymphocytes Relative 19 %   Lymphs Abs 2.0 0.7 - 4.0 K/uL   Monocytes Relative 8 %   Monocytes Absolute 0.8 0.1 - 1.0 K/uL   Eosinophils Relative 0 %   Eosinophils Absolute 0.0 0.0 - 0.5 K/uL   Basophils Relative 0 %   Basophils Absolute 0.0 0.0 - 0.1 K/uL   Immature Granulocytes 0 %   Abs Immature Granulocytes 0.01 0.00 - 0.07 K/uL  Prepare platelet pheresis     Status: None   Collection Time: 08/06/21  9:25 AM  Result Value Ref Range   Unit Number [id number]    Blood Component Type PLTP1 PSORALEN TREATED  Unit division 00    Status of Unit REL FROM [facility]    Transfusion Status      OK TO TRANSFUSE Performed at [facility], [address], [place], [place] [address]   Prepare fresh frozen plasma     Status: None   Collection Time: 08/06/21  9:27 AM  Result Value Ref Range   Unit Number [id number]    Blood Component Type THW PLS APHR    Unit division B0    Status of Unit REL FROM [facility]    Transfusion Status      OK TO TRANSFUSE Performed at [facility], [address], [place], [place] [address]    Unit Number [id number]    Blood Component Type THW PLS APHR    Unit division B0    Status of Unit REL FROM [facility]    Transfusion Status OK TO TRANSFUSE   Prepare RBC (crossmatch)     Status: None   Collection Time: 08/06/21  9:36 AM  Result Value Ref Range   Order Confirmation      ORDER PROCESSED BY BLOOD BANK Performed at [facility], [address] [place], [place] [address]   BLOOD TRANSFUSION REPORT - SCANNED     Status: None   Collection Time: 08/06/21 10:37 AM   Narrative   Ordered by an unspecified provider.  Glucose, capillary     Status: Abnormal   Collection Time: 08/06/21 11:34 AM  Result Value Ref Range   Glucose-Capillary 162 (H) 70 - 99 mg/dL  I-STAT 7, (LYTES, BLD GAS, ICA, H+H)     Status: Abnormal   Collection Time: 08/06/21 11:34 AM  Result Value Ref Range   pH, Arterial 7.413 7.35 - 7.45   pCO2 arterial  34.5 32 - 48 mmHg   pO2, Arterial 78 (L) 83 - 108 mmHg   Bicarbonate 22.2 20.0 - 28.0 mmol/L   TCO2 23 22 - 32 mmol/L   O2 Saturation 96 %   Acid-base deficit 2.0 0.0 - 2.0 mmol/L   Sodium 141 135 - 145 mmol/L   Potassium 4.1 3.5 - 5.1 mmol/L   Calcium, Ion 1.12 (L) 1.15 - 1.40 mmol/L   HCT 21.0 (L) 39.0 - 52.0 %   Hemoglobin 7.1 (L) 13.0 - 17.0 g/dL   Patient temperature 67.1 C    Collection site art line    Drawn by Operator    Sample type ARTERIAL   Provider-confirm verbal Blood Bank order - RBC, FFP; 2 Units; Order taken: 08/05/2021; 5:50 PM; Emergency Release, Level 1 Trauma Emergency Release 2 units RBC's and 2 units FFP from Trauma Fridge     Status: None   Collection Time: 08/06/21  1:03 PM  Result Value Ref Range   Blood product order confirm      MD AUTHORIZATION REQUESTED Performed at [facility], [address] [place], [place] [address]   Provider-confirm verbal Blood Bank order - Order taken: 08/05/2021; 6:08 PM; MTP, Level 1 Trauma Activate MTP     Status: None   Collection Time: 08/06/21  1:06 PM  Result Value Ref Range   Blood product order confirm      MD AUTHORIZATION REQUESTED Performed at [facility], [address] [place], [place] [address]   Glucose, capillary     Status: Abnormal   Collection Time: 08/06/21  3:36 PM  Result Value Ref Range   Glucose-Capillary 125 (H) 70 - 99 mg/dL  Magnesium  Status: None   Collection Time: 08/06/21  3:38 PM  Result Value Ref Range   Magnesium 2.3 1.7 - 2.4 mg/dL  Phosphorus     Status: Abnormal   Collection Time: 08/06/21  3:38 PM  Result Value Ref Range   Phosphorus 2.1 (L) 2.5 - 4.6 mg/dL  APTT     Status: Abnormal   Collection Time: 08/06/21  3:38 PM  Result Value Ref Range   aPTT 40 (H) 24 - 36 seconds  CBC with Differential/Platelet     Status: Abnormal   Collection Time: 08/06/21  3:38 PM  Result Value Ref Range   WBC 6.7 4.0 - 10.5 K/uL   RBC 2.41 (L) 4.22 - 5.81 MIL/uL   Hemoglobin  7.1 (L) 13.0 - 17.0 g/dL   HCT 32.4 (L) 40.1 - 02.7 %   MCV 83.4 80.0 - 100.0 fL   MCH 29.5 26.0 - 34.0 pg   MCHC 35.3 30.0 - 36.0 g/dL   RDW 25.3 66.4 - 40.3 %   Platelets 52 (L) 150 - 400 K/uL   nRBC 0.0 0.0 - 0.2 %   Neutrophils Relative % 66 %   Neutro Abs 4.3 1.7 - 7.7 K/uL   Lymphocytes Relative 27 %   Lymphs Abs 1.8 0.7 - 4.0 K/uL   Monocytes Relative 7 %   Monocytes Absolute 0.5 0.1 - 1.0 K/uL   Eosinophils Relative 0 %   Eosinophils Absolute 0.0 0.0 - 0.5 K/uL   Basophils Relative 0 %   Basophils Absolute 0.0 0.0 - 0.1 K/uL   Immature Granulocytes 0 %   Abs Immature Granulocytes 0.02 0.00 - 0.07 K/uL  Comprehensive metabolic panel     Status: Abnormal   Collection Time: 08/06/21  3:38 PM  Result Value Ref Range   Sodium 141 135 - 145 mmol/L   Potassium 3.6 3.5 - 5.1 mmol/L   Chloride 113 (H) 98 - 111 mmol/L   CO2 22 22 - 32 mmol/L   Glucose, Bld 133 (H) 70 - 99 mg/dL   BUN 16 6 - 20 mg/dL   Creatinine, Ser 4.74 0.61 - 1.24 mg/dL   Calcium 7.5 (L) 8.9 - 10.3 mg/dL   Total Protein 4.4 (L) 6.5 - 8.1 g/dL   Albumin 3.3 (L) 3.5 - 5.0 g/dL   AST 95 (H) 15 - 41 U/L   ALT 44 0 - 44 U/L   Alkaline Phosphatase 30 (L) 38 - 126 U/L   Total Bilirubin 1.7 (H) 0.3 - 1.2 mg/dL   GFR, Estimated >25 >95 mL/min   Anion gap 6 5 - 15  I-STAT 7, (LYTES, BLD GAS, ICA, H+H)     Status: Abnormal   Collection Time: 08/06/21  3:39 PM  Result Value Ref Range   pH, Arterial 7.485 (H) 7.35 - 7.45   pCO2 arterial 28.9 (L) 32 - 48 mmHg   pO2, Arterial 203 (H) 83 - 108 mmHg   Bicarbonate 21.9 20.0 - 28.0 mmol/L   TCO2 23 22 - 32 mmol/L   O2 Saturation 100 %   Acid-base deficit 1.0 0.0 - 2.0 mmol/L   Sodium 142 135 - 145 mmol/L   Potassium 3.6 3.5 - 5.1 mmol/L   Calcium, Ion 1.11 (L) 1.15 - 1.40 mmol/L   HCT 19.0 (L) 39.0 - 52.0 %   Hemoglobin 6.5 (LL) 13.0 - 17.0 g/dL   Patient temperature 63.8 C    Collection site art line    Drawn by Scientist, physiological  type ARTERIAL    Comment  NOTIFIED PHYSICIAN   Prepare RBC (crossmatch)     Status: None   Collection Time: 08/06/21  3:47 PM  Result Value Ref Range   Order Confirmation      ORDER PROCESSED BY BLOOD BANK Performed at [facility], [address] [place], [place] [address]   Lactic acid, plasma     Status: Abnormal   Collection Time: 08/06/21  6:30 PM  Result Value Ref Range   Lactic Acid, Venous 2.4 (HH) 0.5 - 1.9 mmol/L  I-STAT 7, (LYTES, BLD GAS, ICA, H+H)     Status: Abnormal   Collection Time: 08/06/21  6:32 PM  Result Value Ref Range   pH, Arterial 7.425 7.35 - 7.45   pCO2 arterial 32.8 32 - 48 mmHg   pO2, Arterial 115 (H) 83 - 108 mmHg   Bicarbonate 21.6 20.0 - 28.0 mmol/L   TCO2 23 22 - 32 mmol/L   O2 Saturation 99 %   Acid-base deficit 3.0 (H) 0.0 - 2.0 mmol/L   Sodium 142 135 - 145 mmol/L   Potassium 3.6 3.5 - 5.1 mmol/L   Calcium, Ion 1.14 (L) 1.15 - 1.40 mmol/L   HCT 23.0 (L) 39.0 - 52.0 %   Hemoglobin 7.8 (L) 13.0 - 17.0 g/dL   Patient temperature 29.5 C    Collection site art line    Drawn by Operator    Sample type ARTERIAL   Glucose, capillary     Status: Abnormal   Collection Time: 08/06/21  7:49 PM  Result Value Ref Range   Glucose-Capillary 129 (H) 70 - 99 mg/dL  I-STAT 7, (LYTES, BLD GAS, ICA, H+H)     Status: Abnormal   Collection Time: 08/06/21  7:52 PM  Result Value Ref Range   pH, Arterial 7.368 7.35 - 7.45   pCO2 arterial 39.0 32 - 48 mmHg   pO2, Arterial 117 (H) 83 - 108 mmHg   Bicarbonate 22.4 20.0 - 28.0 mmol/L   TCO2 24 22 - 32 mmol/L   O2 Saturation 98 %   Acid-base deficit 3.0 (H) 0.0 - 2.0 mmol/L   Sodium 142 135 - 145 mmol/L   Potassium 3.6 3.5 - 5.1 mmol/L   Calcium, Ion 1.18 1.15 - 1.40 mmol/L   HCT 23.0 (L) 39.0 - 52.0 %   Hemoglobin 7.8 (L) 13.0 - 17.0 g/dL   Patient temperature 62.1 C    Sample type ARTERIAL   Glucose, capillary     Status: Abnormal   Collection Time: 08/06/21 11:10 PM  Result Value Ref Range   Glucose-Capillary 135 (H) 70 -  99 mg/dL  CBC     Status: Abnormal   Collection Time: 08/07/21  4:05 AM  Result Value Ref Range   WBC 6.2 4.0 - 10.5 K/uL   RBC 2.95 (L) 4.22 - 5.81 MIL/uL   Hemoglobin 8.7 (L) 13.0 - 17.0 g/dL   HCT 30.8 (L) 65.7 - 84.6 %   MCV 85.4 80.0 - 100.0 fL   MCH 29.5 26.0 - 34.0 pg   MCHC 34.5 30.0 - 36.0 g/dL   RDW 96.2 (H) 95.2 - 84.1 %   Platelets 46 (L) 150 - 400 K/uL   nRBC 0.0 0.0 - 0.2 %  Basic metabolic panel     Status: Abnormal   Collection Time: 08/07/21  4:05 AM  Result Value Ref Range   Sodium 137 135 - 145 mmol/L   Potassium 3.8 3.5 - 5.1 mmol/L  Chloride 112 (H) 98 - 111 mmol/L   CO2 21 (L) 22 - 32 mmol/L   Glucose, Bld 125 (H) 70 - 99 mg/dL   BUN 15 6 - 20 mg/dL   Creatinine, Ser 1.61 0.61 - 1.24 mg/dL   Calcium 7.4 (L) 8.9 - 10.3 mg/dL   GFR, Estimated >09 >60 mL/min   Anion gap 4 (L) 5 - 15  Lactic acid, plasma     Status: None   Collection Time: 08/07/21  4:05 AM  Result Value Ref Range   Lactic Acid, Venous 1.4 0.5 - 1.9 mmol/L  Lactate dehydrogenase     Status: Abnormal   Collection Time: 08/07/21  4:05 AM  Result Value Ref Range   LDH 345 (H) 98 - 192 U/L  Protime-INR     Status: Abnormal   Collection Time: 08/07/21  4:05 AM  Result Value Ref Range   Prothrombin Time 21.1 (H) 11.4 - 15.2 seconds   INR 1.8 (H) 0.8 - 1.2  Fibrinogen     Status: None   Collection Time: 08/07/21  4:05 AM  Result Value Ref Range   Fibrinogen 312 210 - 475 mg/dL  Hepatic function panel     Status: Abnormal   Collection Time: 08/07/21  4:05 AM  Result Value Ref Range   Total Protein 4.3 (L) 6.5 - 8.1 g/dL   Albumin 2.9 (L) 3.5 - 5.0 g/dL   AST 94 (H) 15 - 41 U/L   ALT 43 0 - 44 U/L   Alkaline Phosphatase 31 (L) 38 - 126 U/L   Total Bilirubin 1.4 (H) 0.3 - 1.2 mg/dL   Bilirubin, Direct 0.4 (H) 0.0 - 0.2 mg/dL   Indirect Bilirubin 1.0 (H) 0.3 - 0.9 mg/dL  Magnesium     Status: None   Collection Time: 08/07/21  4:05 AM  Result Value Ref Range   Magnesium 2.2 1.7 - 2.4  mg/dL  Phosphorus     Status: None   Collection Time: 08/07/21  4:05 AM  Result Value Ref Range   Phosphorus 2.9 2.5 - 4.6 mg/dL  APTT     Status: Abnormal   Collection Time: 08/07/21  4:05 AM  Result Value Ref Range   aPTT 40 (H) 24 - 36 seconds  Global TEG Panel     Status: Abnormal   Collection Time: 08/07/21  4:05 AM  Result Value Ref Range   Citrated Kaolin (R) 5.8 4.6 - 9.1 min   Citrated Kaolin (K) 2.3 (H) 0.8 - 2.1 min   Citrated Kaolin Angle 70.1 63 - 78 deg   Citrated Kaolin (MA) 49.5 (L) 52 - 69 mm   Citrated Rapid TEG (MA) 46.5 (L) 52 - 70 mm   CK with Heparinase (R) 6.5 4.3 - 8.3 min   CFF Max Amplitude 14.6 (L) 15 - 32 mm   Citrated Functional Fibrinogen 266.4 (L) 278 - 581 mg/dL  I-STAT 7, (LYTES, BLD GAS, ICA, H+H)     Status: Abnormal   Collection Time: 08/07/21  4:14 AM  Result Value Ref Range   pH, Arterial 7.351 7.35 - 7.45   pCO2 arterial 39.3 32 - 48 mmHg   pO2, Arterial 114 (H) 83 - 108 mmHg   Bicarbonate 21.8 20.0 - 28.0 mmol/L   TCO2 23 22 - 32 mmol/L   O2 Saturation 98 %   Acid-base deficit 4.0 (H) 0.0 - 2.0 mmol/L   Sodium 141 135 - 145 mmol/L   Potassium 4.0 3.5 - 5.1 mmol/L  Calcium, Ion 1.20 1.15 - 1.40 mmol/L   HCT 23.0 (L) 39.0 - 52.0 %   Hemoglobin 7.8 (L) 13.0 - 17.0 g/dL   Patient temperature 94.1 C    Sample type ARTERIAL   Prepare platelet pheresis     Status: None (Preliminary result)   Collection Time: 08/07/21  7:54 AM  Result Value Ref Range   Unit Number [id number]    Blood Component Type PLTP3 PSORALEN TREATED    Unit division 00    Status of Unit ALLOCATED    Transfusion Status OK TO TRANSFUSE     Assessment & Plan: Present on Admission:  Trauma of chest  ARDS (adult respiratory distress syndrome) ([name])  Contusion of left lung  Acute on chronic respiratory failure with hypoxia and hypercapnia ([name])  Critical polytrauma  TBI (traumatic brain injury) ([name])    LOS: 2 days   Additional comments:I reviewed the  patient's new clinical lab test results. And CXR 22M [facility]   SDH, IVH, SAH - NSGY c/s, Dr. Maurice Small, repeat CT head 7/12 some IVH but fairly stable L rib fx 1-8 with L HPTX - L CT to water seal today Extensive pulmonary consolidation with refractory hypoxemia - cannulated for VV-ECMO 7/11, sweep at 6 Suspected aspiration - empiric mero, resp CX P Fracture dislocation L elbow/olecranon and ulnar styloid with comminuted fracture of the left midshaft and distal humerus - initial eval by Dr. Linna Caprice, splinted, definitive care per Dr. Derinda Sis Trauma, OR today with Dr. Jena Gauss I&D, CR L humerus  Comminuted fracture dislocation R hip - reduced in TB, KI in place, will need traction pin today by Dr. Jena Gauss Complex lacerations RUE/RLE with degloving - operative washout and repair today by Dr. Jena Gauss ?Subtle splenic injury - monitor hgb    R comminuted first metacarpal fracture/open -irrigated and splinted by Dr. Linna Caprice, hand surgery notified by EDP Coagulopathy - PLTs now Shock - albumin, improved AKI - improving ID - d/c empiric vanc FEN - NPO, no TF for OR DVT - SCDs, hold DVT ppx for now, low dose heparin ?tomorrow Dispo - ICU  Discussed at multidisciplinary ECMO rounds Critical Care Total Time*: 45 Minutes  Violeta Gelinas, MD, MPH, FACS Trauma & General Surgery Use [email or web address] to contact on call provider  08/07/2021  *Care during the described time interval was provided by me. I have reviewed this patient's available data, including medical history, events of note, physical examination and test results as part of my evaluation.

## 2021-08-07 NOTE — Progress Notes (Signed)
Transported patient to and from OR without issues.  Pt tolerated well.
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
## 2021-08-07 NOTE — Transfer of Care (Signed)
Immediate Anesthesia Transfer of Care Note  Patient: Jeff [name]  Procedure(s) Performed: Darden Dates OF RIGHT UPPER EXTREMITY AND RIGHT LOWER EXTREMITY (Right: Leg Upper) INSERTION OF TRACTION PIN RIGHT UPPER QUAD (Right)  Patient Location: SICU  Anesthesia Type:General  Level of Consciousness: Patient remains intubated per anesthesia plan  Airway & Oxygen Therapy: Patient remains intubated per anesthesia plan and Patient placed on Ventilator (see vital sign flow sheet for setting)  Post-op Assessment: Report given to RN and Post -op Vital signs reviewed and stable  Post vital signs: Reviewed and stable  Last Vitals:  Vitals Value Taken Time  BP 106/70   Temp 35.8 C 08/07/21 1239  Pulse 70 08/07/21 1239  Resp 16 08/07/21 1239  SpO2 100 % 08/07/21 1239  Vitals shown include unvalidated device data.  Last Pain:  Vitals:   08/07/21 0935  TempSrc: Bladder         Complications: No notable events documented.
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
## 2021-08-07 NOTE — Progress Notes (Signed)
Patient ID: Jeff [name], male   DOB: 06-23-2000, 21 y.o.   MRN: [id number] Patient seen postoperatively.  Looking good.  Off pressors.  I discussed operative findings with Dr. Jena Gauss.  Violeta Gelinas, MD, MPH, FACS Please use [email or web address] to contact on call provider
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
## 2021-08-07 NOTE — Anesthesia Postprocedure Evaluation (Signed)
Anesthesia Post Note  Patient: Jeff [name]  Procedure(s) Performed: Darden Dates OF RIGHT UPPER EXTREMITY AND RIGHT LOWER EXTREMITY (Right: Leg Upper) INSERTION OF TRACTION PIN RIGHT UPPER QUAD (Right)     Patient location during evaluation: SICU Anesthesia Type: General Level of consciousness: sedated Pain management: pain level controlled Vital Signs Assessment: post-procedure vital signs reviewed and stable Respiratory status: patient remains intubated per anesthesia plan Cardiovascular status: stable Postop Assessment: no apparent nausea or vomiting Anesthetic complications: no   No notable events documented.  Last Vitals:  Vitals:   08/07/21 0945 08/07/21 1240  BP:    Pulse: 81 68  Resp: (!) 0 16  Temp: 36.8 C   SpO2: 96% 100%    Last Pain:  Vitals:   08/07/21 0935  TempSrc: Bladder                 Trevor Iha

## 2021-08-08 ENCOUNTER — Inpatient Hospital Stay (HOSPITAL_COMMUNITY): Payer: Medicaid Other

## 2021-08-08 DIAGNOSIS — J9621 Acute and chronic respiratory failure with hypoxia: Secondary | ICD-10-CM | POA: Diagnosis not present

## 2021-08-08 DIAGNOSIS — S299XXA Unspecified injury of thorax, initial encounter: Secondary | ICD-10-CM | POA: Diagnosis not present

## 2021-08-08 DIAGNOSIS — S27321A Contusion of lung, unilateral, initial encounter: Secondary | ICD-10-CM

## 2021-08-08 DIAGNOSIS — J80 Acute respiratory distress syndrome: Secondary | ICD-10-CM | POA: Diagnosis not present

## 2021-08-08 DIAGNOSIS — J9622 Acute and chronic respiratory failure with hypercapnia: Secondary | ICD-10-CM | POA: Diagnosis not present

## 2021-08-08 LAB — POCT I-STAT 7, (LYTES, BLD GAS, ICA,H+H)
Acid-Base Excess: 0 mmol/L (ref 0.0–2.0)
Acid-Base Excess: 0 mmol/L (ref 0.0–2.0)
Acid-base deficit: 1 mmol/L (ref 0.0–2.0)
Acid-base deficit: 3 mmol/L — ABNORMAL HIGH (ref 0.0–2.0)
Acid-base deficit: 3 mmol/L — ABNORMAL HIGH (ref 0.0–2.0)
Acid-base deficit: 4 mmol/L — ABNORMAL HIGH (ref 0.0–2.0)
Bicarbonate: 21.2 mmol/L (ref 20.0–28.0)
Bicarbonate: 21.3 mmol/L (ref 20.0–28.0)
Bicarbonate: 22.3 mmol/L (ref 20.0–28.0)
Bicarbonate: 24.9 mmol/L (ref 20.0–28.0)
Bicarbonate: 25 mmol/L (ref 20.0–28.0)
Bicarbonate: 25.7 mmol/L (ref 20.0–28.0)
Calcium, Ion: 1.2 mmol/L (ref 1.15–1.40)
Calcium, Ion: 1.24 mmol/L (ref 1.15–1.40)
Calcium, Ion: 1.25 mmol/L (ref 1.15–1.40)
Calcium, Ion: 1.25 mmol/L (ref 1.15–1.40)
Calcium, Ion: 1.25 mmol/L (ref 1.15–1.40)
Calcium, Ion: 1.25 mmol/L (ref 1.15–1.40)
HCT: 18 % — ABNORMAL LOW (ref 39.0–52.0)
HCT: 20 % — ABNORMAL LOW (ref 39.0–52.0)
HCT: 20 % — ABNORMAL LOW (ref 39.0–52.0)
HCT: 21 % — ABNORMAL LOW (ref 39.0–52.0)
HCT: 21 % — ABNORMAL LOW (ref 39.0–52.0)
HCT: 22 % — ABNORMAL LOW (ref 39.0–52.0)
Hemoglobin: 6.1 g/dL — CL (ref 13.0–17.0)
Hemoglobin: 6.8 g/dL — CL (ref 13.0–17.0)
Hemoglobin: 6.8 g/dL — CL (ref 13.0–17.0)
Hemoglobin: 7.1 g/dL — ABNORMAL LOW (ref 13.0–17.0)
Hemoglobin: 7.1 g/dL — ABNORMAL LOW (ref 13.0–17.0)
Hemoglobin: 7.5 g/dL — ABNORMAL LOW (ref 13.0–17.0)
O2 Saturation: 97 %
O2 Saturation: 97 %
O2 Saturation: 97 %
O2 Saturation: 97 %
O2 Saturation: 97 %
O2 Saturation: 98 %
Patient temperature: 36.7
Patient temperature: 36.8
Patient temperature: 36.8
Patient temperature: 36.9
Patient temperature: 37.1
Patient temperature: 37.1
Potassium: 3.8 mmol/L (ref 3.5–5.1)
Potassium: 3.9 mmol/L (ref 3.5–5.1)
Potassium: 4 mmol/L (ref 3.5–5.1)
Potassium: 4.1 mmol/L (ref 3.5–5.1)
Potassium: 4.2 mmol/L (ref 3.5–5.1)
Potassium: 4.2 mmol/L (ref 3.5–5.1)
Sodium: 149 mmol/L — ABNORMAL HIGH (ref 135–145)
Sodium: 151 mmol/L — ABNORMAL HIGH (ref 135–145)
Sodium: 152 mmol/L — ABNORMAL HIGH (ref 135–145)
Sodium: 155 mmol/L — ABNORMAL HIGH (ref 135–145)
Sodium: 157 mmol/L — ABNORMAL HIGH (ref 135–145)
Sodium: 157 mmol/L — ABNORMAL HIGH (ref 135–145)
TCO2: 22 mmol/L (ref 22–32)
TCO2: 22 mmol/L (ref 22–32)
TCO2: 24 mmol/L (ref 22–32)
TCO2: 26 mmol/L (ref 22–32)
TCO2: 26 mmol/L (ref 22–32)
TCO2: 27 mmol/L (ref 22–32)
pCO2 arterial: 35.3 mmHg (ref 32–48)
pCO2 arterial: 35.8 mmHg (ref 32–48)
pCO2 arterial: 40.2 mmHg (ref 32–48)
pCO2 arterial: 43.8 mmHg (ref 32–48)
pCO2 arterial: 44.3 mmHg (ref 32–48)
pCO2 arterial: 46.8 mmHg (ref 32–48)
pH, Arterial: 7.347 — ABNORMAL LOW (ref 7.35–7.45)
pH, Arterial: 7.353 (ref 7.35–7.45)
pH, Arterial: 7.357 (ref 7.35–7.45)
pH, Arterial: 7.364 (ref 7.35–7.45)
pH, Arterial: 7.382 (ref 7.35–7.45)
pH, Arterial: 7.389 (ref 7.35–7.45)
pO2, Arterial: 101 mmHg (ref 83–108)
pO2, Arterial: 103 mmHg (ref 83–108)
pO2, Arterial: 88 mmHg (ref 83–108)
pO2, Arterial: 89 mmHg (ref 83–108)
pO2, Arterial: 91 mmHg (ref 83–108)
pO2, Arterial: 93 mmHg (ref 83–108)

## 2021-08-08 LAB — BPAM PLATELET PHERESIS
Blood Product Expiration Date: 202307142359
ISSUE DATE / TIME: 202307130813
Unit Type and Rh: 7300

## 2021-08-08 LAB — PREPARE PLATELET PHERESIS: Unit division: 0

## 2021-08-08 LAB — BASIC METABOLIC PANEL
Anion gap: 3 — ABNORMAL LOW (ref 5–15)
Anion gap: 5 (ref 5–15)
BUN: 10 mg/dL (ref 6–20)
BUN: 7 mg/dL (ref 6–20)
CO2: 22 mmol/L (ref 22–32)
CO2: 25 mmol/L (ref 22–32)
Calcium: 7.6 mg/dL — ABNORMAL LOW (ref 8.9–10.3)
Calcium: 7.8 mg/dL — ABNORMAL LOW (ref 8.9–10.3)
Chloride: 123 mmol/L — ABNORMAL HIGH (ref 98–111)
Chloride: 125 mmol/L — ABNORMAL HIGH (ref 98–111)
Creatinine, Ser: 1.04 mg/dL (ref 0.61–1.24)
Creatinine, Ser: 1.05 mg/dL (ref 0.61–1.24)
GFR, Estimated: >60 mL/min (ref >60)
GFR, Estimated: >60 mL/min (ref >60)
Glucose, Bld: 156 mg/dL — ABNORMAL HIGH (ref 70–99)
Glucose, Bld: 123 mg/dL — ABNORMAL HIGH (ref 70–99)
Potassium: 3.8 mmol/L (ref 3.5–5.1)
Potassium: 4.2 mmol/L (ref 3.5–5.1)
Sodium: 148 mmol/L — ABNORMAL HIGH (ref 135–145)
Sodium: 155 mmol/L — ABNORMAL HIGH (ref 135–145)

## 2021-08-08 LAB — CBC
HCT: 23.4 % — ABNORMAL LOW (ref 39.0–52.0)
HCT: 23 % — ABNORMAL LOW (ref 39.0–52.0)
Hemoglobin: 8.2 g/dL — ABNORMAL LOW (ref 13.0–17.0)
Hemoglobin: 7.6 g/dL — ABNORMAL LOW (ref 13.0–17.0)
MCH: 29.8 pg (ref 26.0–34.0)
MCH: 29.2 pg (ref 26.0–34.0)
MCHC: 35 g/dL (ref 30.0–36.0)
MCHC: 33 g/dL (ref 30.0–36.0)
MCV: 85.1 fL (ref 80.0–100.0)
MCV: 88.5 fL (ref 80.0–100.0)
Platelets: 68 10*3/uL — ABNORMAL LOW (ref 150–400)
Platelets: 77 10*3/uL — ABNORMAL LOW (ref 150–400)
RBC: 2.75 MIL/uL — ABNORMAL LOW (ref 4.22–5.81)
RBC: 2.6 MIL/uL — ABNORMAL LOW (ref 4.22–5.81)
RDW: 16.4 % — ABNORMAL HIGH (ref 11.5–15.5)
RDW: 16.8 % — ABNORMAL HIGH (ref 11.5–15.5)
WBC: 7.3 10*3/uL (ref 4.0–10.5)
WBC: 7.6 10*3/uL (ref 4.0–10.5)
nRBC: 0 % (ref 0.0–0.2)
nRBC: 0 % (ref 0.0–0.2)

## 2021-08-08 LAB — HEPATIC FUNCTION PANEL
ALT: 50 U/L — ABNORMAL HIGH (ref 0–44)
AST: 127 U/L — ABNORMAL HIGH (ref 15–41)
Albumin: 2.6 g/dL — ABNORMAL LOW (ref 3.5–5.0)
Alkaline Phosphatase: 54 U/L (ref 38–126)
Bilirubin, Direct: 0.9 mg/dL — ABNORMAL HIGH (ref 0.0–0.2)
Indirect Bilirubin: 1 mg/dL — ABNORMAL HIGH (ref 0.3–0.9)
Total Bilirubin: 1.9 mg/dL — ABNORMAL HIGH (ref 0.3–1.2)
Total Protein: 4.4 g/dL — ABNORMAL LOW (ref 6.5–8.1)

## 2021-08-08 LAB — HEMOGLOBIN AND HEMATOCRIT, BLOOD
HCT: 22.2 % — ABNORMAL LOW (ref 39.0–52.0)
Hemoglobin: 7.4 g/dL — ABNORMAL LOW (ref 13.0–17.0)

## 2021-08-08 LAB — GLUCOSE, CAPILLARY
Glucose-Capillary: 113 mg/dL — ABNORMAL HIGH (ref 70–99)
Glucose-Capillary: 116 mg/dL — ABNORMAL HIGH (ref 70–99)
Glucose-Capillary: 116 mg/dL — ABNORMAL HIGH (ref 70–99)
Glucose-Capillary: 128 mg/dL — ABNORMAL HIGH (ref 70–99)
Glucose-Capillary: 134 mg/dL — ABNORMAL HIGH (ref 70–99)
Glucose-Capillary: 139 mg/dL — ABNORMAL HIGH (ref 70–99)
Glucose-Capillary: 144 mg/dL — ABNORMAL HIGH (ref 70–99)
Glucose-Capillary: 58 mg/dL — ABNORMAL LOW (ref 70–99)

## 2021-08-08 LAB — HEPARIN LEVEL (UNFRACTIONATED): Heparin Unfractionated: <0.1 IU/mL — ABNORMAL LOW (ref 0.30–0.70)

## 2021-08-08 LAB — PROTIME-INR
INR: 1.5 — ABNORMAL HIGH (ref 0.8–1.2)
Prothrombin Time: 18.2 seconds — ABNORMAL HIGH (ref 11.4–15.2)

## 2021-08-08 LAB — FIBRINOGEN: Fibrinogen: 449 mg/dL (ref 210–475)

## 2021-08-08 LAB — LACTATE DEHYDROGENASE: LDH: 387 U/L — ABNORMAL HIGH (ref 98–192)

## 2021-08-08 LAB — APTT: aPTT: 39 seconds — ABNORMAL HIGH (ref 24–36)

## 2021-08-08 MED ORDER — FREE WATER
100.0000 mL | Status: DC
Start: 2021-08-08 — End: 2021-08-09
  Administered 2021-08-08 – 2021-08-09 (×6): 100 mL

## 2021-08-08 MED ORDER — FREE WATER: 150.0000 mL | Status: DC

## 2021-08-08 MED ORDER — SODIUM CHLORIDE 0.9 % IV SOLN
1.0000 mg/kg/h | INTRAVENOUS | Status: DC
  Administered 2021-08-08 – 2021-08-09 (×8): 1 mg/kg/h via INTRAVENOUS
  Filled 2021-08-08 (×4): qty 5
  Filled 2021-08-08: qty 10
  Filled 2021-08-08 (×4): qty 5

## 2021-08-08 MED ORDER — KETAMINE BOLUS VIA INFUSION
0.1000 mg/kg | Freq: Once | INTRAVENOUS | Status: AC
  Administered 2021-08-08: 9.26 mg via INTRAVENOUS
  Filled 2021-08-08: qty 10

## 2021-08-08 MED ORDER — ACETAMINOPHEN 500 MG PO TABS
1000.0000 mg | ORAL_TABLET | Freq: Four times a day (QID) | ORAL | Status: DC
  Administered 2021-08-08 – 2021-08-10 (×11): 1000 mg
  Filled 2021-08-08 (×11): qty 2

## 2021-08-08 MED ORDER — METHOCARBAMOL 500 MG PO TABS
1000.0000 mg | ORAL_TABLET | Freq: Three times a day (TID) | ORAL | Status: DC
  Administered 2021-08-08 – 2021-08-28 (×62): 1000 mg
  Filled 2021-08-08 (×62): qty 2

## 2021-08-08 MED ORDER — PIVOT 1.5 CAL PO LIQD
1000.0000 mL | ORAL | Status: AC
  Administered 2021-08-10 – 2021-08-13 (×4): 1000 mL

## 2021-08-08 MED ORDER — HEPARIN (PORCINE) 25000 UT/250ML-% IV SOLN
600.0000 [IU]/h | INTRAVENOUS | Status: DC
  Administered 2021-08-08: 600 [IU]/h via INTRAVENOUS
  Filled 2021-08-08: qty 250

## 2021-08-08 MED ORDER — POTASSIUM PHOSPHATES 15 MMOLE/5ML IV SOLN
20.0000 mmol | Freq: Once | INTRAVENOUS | Status: AC
  Administered 2021-08-08: 20 mmol via INTRAVENOUS
  Filled 2021-08-08: qty 6.67

## 2021-08-08 MED ORDER — OXYCODONE HCL 5 MG/5ML PO SOLN
5.0000 mg | ORAL | Status: DC | PRN
  Administered 2021-08-09 – 2021-08-22 (×13): 10 mg
  Filled 2021-08-08 (×13): qty 10

## 2021-08-08 MED ORDER — KETOROLAC TROMETHAMINE 15 MG/ML IJ SOLN
15.0000 mg | Freq: Four times a day (QID) | INTRAMUSCULAR | Status: DC
  Administered 2021-08-08 – 2021-08-09 (×7): 15 mg via INTRAVENOUS
  Filled 2021-08-08 (×7): qty 1

## 2021-08-08 MED ORDER — OXYCODONE HCL 5 MG/5ML PO SOLN
10.0000 mg | Freq: Four times a day (QID) | ORAL | Status: DC
  Administered 2021-08-08 – 2021-08-18 (×38): 10 mg
  Filled 2021-08-08 (×38): qty 10
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
## 2021-08-08 NOTE — Progress Notes (Signed)
OT Cancellation Note  Patient Details Name: Jeff [name] MRN: [id number] DOB: 2001-01-05   Cancelled Treatment:    Reason Eval/Treat Not Completed: Medical issues which prohibited therapy.  OT will sign off for now given medical complications, and await new orders once rehab appropriate per MD team.    Caralyn Guile Bonetta Mostek 08/08/2021, 9:06 AM 08/08/2021  RP, OTR/L  Acute Rehabilitation Services  Office:  [phone]
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
## 2021-08-09 ENCOUNTER — Inpatient Hospital Stay (HOSPITAL_COMMUNITY): Payer: Medicaid Other

## 2021-08-09 DIAGNOSIS — S299XXA Unspecified injury of thorax, initial encounter: Secondary | ICD-10-CM

## 2021-08-09 DIAGNOSIS — R042 Hemoptysis: Secondary | ICD-10-CM

## 2021-08-09 DIAGNOSIS — S27322A Contusion of lung, bilateral, initial encounter: Secondary | ICD-10-CM

## 2021-08-09 DIAGNOSIS — J9601 Acute respiratory failure with hypoxia: Secondary | ICD-10-CM | POA: Diagnosis not present

## 2021-08-09 DIAGNOSIS — S27321A Contusion of lung, unilateral, initial encounter: Secondary | ICD-10-CM | POA: Diagnosis not present

## 2021-08-09 DIAGNOSIS — Z9911 Dependence on respirator [ventilator] status: Secondary | ICD-10-CM | POA: Diagnosis not present

## 2021-08-09 DIAGNOSIS — S270XXA Traumatic pneumothorax, initial encounter: Secondary | ICD-10-CM

## 2021-08-09 DIAGNOSIS — J80 Acute respiratory distress syndrome: Secondary | ICD-10-CM | POA: Diagnosis not present

## 2021-08-09 LAB — POCT I-STAT 7, (LYTES, BLD GAS, ICA,H+H)
Acid-Base Excess: 0 mmol/L (ref 0.0–2.0)
Acid-Base Excess: 0 mmol/L (ref 0.0–2.0)
Acid-Base Excess: 1 mmol/L (ref 0.0–2.0)
Acid-Base Excess: 3 mmol/L — ABNORMAL HIGH (ref 0.0–2.0)
Acid-base deficit: 1 mmol/L (ref 0.0–2.0)
Acid-base deficit: 1 mmol/L (ref 0.0–2.0)
Acid-base deficit: 1 mmol/L (ref 0.0–2.0)
Acid-base deficit: 2 mmol/L (ref 0.0–2.0)
Acid-base deficit: 1 mmol/L (ref 0.0–2.0)
Acid-base deficit: 1 mmol/L (ref 0.0–2.0)
Acid-base deficit: 2 mmol/L (ref 0.0–2.0)
Bicarbonate: 26.4 mmol/L (ref 20.0–28.0)
Bicarbonate: 26.4 mmol/L (ref 20.0–28.0)
Bicarbonate: 26 mmol/L (ref 20.0–28.0)
Bicarbonate: 25.3 mmol/L (ref 20.0–28.0)
Bicarbonate: 24.4 mmol/L (ref 20.0–28.0)
Bicarbonate: 24.1 mmol/L (ref 20.0–28.0)
Bicarbonate: 25.1 mmol/L (ref 20.0–28.0)
Bicarbonate: 26.4 mmol/L (ref 20.0–28.0)
Bicarbonate: 24.8 mmol/L (ref 20.0–28.0)
Bicarbonate: 25 mmol/L (ref 20.0–28.0)
Bicarbonate: 23.5 mmol/L (ref 20.0–28.0)
Calcium, Ion: 1.26 mmol/L (ref 1.15–1.40)
Calcium, Ion: 1.24 mmol/L (ref 1.15–1.40)
Calcium, Ion: 1.23 mmol/L (ref 1.15–1.40)
Calcium, Ion: 1.24 mmol/L (ref 1.15–1.40)
Calcium, Ion: 1.25 mmol/L (ref 1.15–1.40)
Calcium, Ion: 1.17 mmol/L (ref 1.15–1.40)
Calcium, Ion: 1.24 mmol/L (ref 1.15–1.40)
Calcium, Ion: 1.25 mmol/L (ref 1.15–1.40)
Calcium, Ion: 1.23 mmol/L (ref 1.15–1.40)
Calcium, Ion: 1.25 mmol/L (ref 1.15–1.40)
Calcium, Ion: 1.25 mmol/L (ref 1.15–1.40)
HCT: 16 % — ABNORMAL LOW (ref 39.0–52.0)
HCT: 18 % — ABNORMAL LOW (ref 39.0–52.0)
HCT: 18 % — ABNORMAL LOW (ref 39.0–52.0)
HCT: 22 % — ABNORMAL LOW (ref 39.0–52.0)
HCT: 21 % — ABNORMAL LOW (ref 39.0–52.0)
HCT: 21 % — ABNORMAL LOW (ref 39.0–52.0)
HCT: 21 % — ABNORMAL LOW (ref 39.0–52.0)
HCT: 22 % — ABNORMAL LOW (ref 39.0–52.0)
HCT: 19 % — ABNORMAL LOW (ref 39.0–52.0)
HCT: 21 % — ABNORMAL LOW (ref 39.0–52.0)
HCT: 17 % — ABNORMAL LOW (ref 39.0–52.0)
Hemoglobin: 5.4 g/dL — CL (ref 13.0–17.0)
Hemoglobin: 6.1 g/dL — CL (ref 13.0–17.0)
Hemoglobin: 6.1 g/dL — CL (ref 13.0–17.0)
Hemoglobin: 7.5 g/dL — ABNORMAL LOW (ref 13.0–17.0)
Hemoglobin: 7.1 g/dL — ABNORMAL LOW (ref 13.0–17.0)
Hemoglobin: 7.1 g/dL — ABNORMAL LOW (ref 13.0–17.0)
Hemoglobin: 7.1 g/dL — ABNORMAL LOW (ref 13.0–17.0)
Hemoglobin: 7.5 g/dL — ABNORMAL LOW (ref 13.0–17.0)
Hemoglobin: 6.5 g/dL — CL (ref 13.0–17.0)
Hemoglobin: 7.1 g/dL — ABNORMAL LOW (ref 13.0–17.0)
Hemoglobin: 5.8 g/dL — CL (ref 13.0–17.0)
O2 Saturation: 90 %
O2 Saturation: 92 %
O2 Saturation: 98 %
O2 Saturation: 92 %
O2 Saturation: 93 %
O2 Saturation: 100 %
O2 Saturation: 59 %
O2 Saturation: 90 %
O2 Saturation: 98 %
O2 Saturation: 99 %
O2 Saturation: 90 %
Patient temperature: 36.8
Patient temperature: 36.8
Patient temperature: 36.8
Patient temperature: 36.8
Patient temperature: 36.7
Patient temperature: 36.7
Patient temperature: 36.7
Patient temperature: 36.9
Patient temperature: 36.8
Patient temperature: 36.9
Potassium: 3.8 mmol/L (ref 3.5–5.1)
Potassium: 3.9 mmol/L (ref 3.5–5.1)
Potassium: 3.9 mmol/L (ref 3.5–5.1)
Potassium: 3.9 mmol/L (ref 3.5–5.1)
Potassium: 3.9 mmol/L (ref 3.5–5.1)
Potassium: 3.8 mmol/L (ref 3.5–5.1)
Potassium: 3.8 mmol/L (ref 3.5–5.1)
Potassium: 3.8 mmol/L (ref 3.5–5.1)
Potassium: 4.1 mmol/L (ref 3.5–5.1)
Potassium: 3.9 mmol/L (ref 3.5–5.1)
Potassium: 3.9 mmol/L (ref 3.5–5.1)
Sodium: 157 mmol/L — ABNORMAL HIGH (ref 135–145)
Sodium: 158 mmol/L — ABNORMAL HIGH (ref 135–145)
Sodium: 158 mmol/L — ABNORMAL HIGH (ref 135–145)
Sodium: 158 mmol/L — ABNORMAL HIGH (ref 135–145)
Sodium: 156 mmol/L — ABNORMAL HIGH (ref 135–145)
Sodium: 157 mmol/L — ABNORMAL HIGH (ref 135–145)
Sodium: 157 mmol/L — ABNORMAL HIGH (ref 135–145)
Sodium: 158 mmol/L — ABNORMAL HIGH (ref 135–145)
Sodium: 157 mmol/L — ABNORMAL HIGH (ref 135–145)
Sodium: 156 mmol/L — ABNORMAL HIGH (ref 135–145)
Sodium: 157 mmol/L — ABNORMAL HIGH (ref 135–145)
TCO2: 28 mmol/L (ref 22–32)
TCO2: 28 mmol/L (ref 22–32)
TCO2: 27 mmol/L (ref 22–32)
TCO2: 27 mmol/L (ref 22–32)
TCO2: 26 mmol/L (ref 22–32)
TCO2: 26 mmol/L (ref 22–32)
TCO2: 27 mmol/L (ref 22–32)
TCO2: 27 mmol/L (ref 22–32)
TCO2: 26 mmol/L (ref 22–32)
TCO2: 26 mmol/L (ref 22–32)
TCO2: 25 mmol/L (ref 22–32)
pCO2 arterial: 58.1 mmHg — ABNORMAL HIGH (ref 32–48)
pCO2 arterial: 51.2 mmHg — ABNORMAL HIGH (ref 32–48)
pCO2 arterial: 43.8 mmHg (ref 32–48)
pCO2 arterial: 50.3 mmHg — ABNORMAL HIGH (ref 32–48)
pCO2 arterial: 43.1 mmHg (ref 32–48)
pCO2 arterial: 33.9 mmHg (ref 32–48)
pCO2 arterial: 45.2 mmHg (ref 32–48)
pCO2 arterial: 48.7 mmHg — ABNORMAL HIGH (ref 32–48)
pCO2 arterial: 49.8 mmHg — ABNORMAL HIGH (ref 32–48)
pCO2 arterial: 48.2 mmHg — ABNORMAL HIGH (ref 32–48)
pCO2 arterial: 42.9 mmHg (ref 32–48)
pH, Arterial: 7.265 — ABNORMAL LOW (ref 7.35–7.45)
pH, Arterial: 7.318 — ABNORMAL LOW (ref 7.35–7.45)
pH, Arterial: 7.38 (ref 7.35–7.45)
pH, Arterial: 7.309 — ABNORMAL LOW (ref 7.35–7.45)
pH, Arterial: 7.359 (ref 7.35–7.45)
pH, Arterial: 7.319 — ABNORMAL LOW (ref 7.35–7.45)
pH, Arterial: 7.334 — ABNORMAL LOW (ref 7.35–7.45)
pH, Arterial: 7.499 — ABNORMAL HIGH (ref 7.35–7.45)
pH, Arterial: 7.305 — ABNORMAL LOW (ref 7.35–7.45)
pH, Arterial: 7.323 — ABNORMAL LOW (ref 7.35–7.45)
pH, Arterial: 7.347 — ABNORMAL LOW (ref 7.35–7.45)
pO2, Arterial: 67 mmHg — ABNORMAL LOW (ref 83–108)
pO2, Arterial: 69 mmHg — ABNORMAL LOW (ref 83–108)
pO2, Arterial: 98 mmHg (ref 83–108)
pO2, Arterial: 71 mmHg — ABNORMAL LOW (ref 83–108)
pO2, Arterial: 69 mmHg — ABNORMAL LOW (ref 83–108)
pO2, Arterial: 34 mmHg — CL (ref 83–108)
pO2, Arterial: 593 mmHg — ABNORMAL HIGH (ref 83–108)
pO2, Arterial: 63 mmHg — ABNORMAL LOW (ref 83–108)
pO2, Arterial: 123 mmHg — ABNORMAL HIGH (ref 83–108)
pO2, Arterial: 138 mmHg — ABNORMAL HIGH (ref 83–108)
pO2, Arterial: 61 mmHg — ABNORMAL LOW (ref 83–108)

## 2021-08-09 LAB — BPAM RBC
Blood Product Expiration Date: 202307202359
Blood Product Expiration Date: 202307202359
Blood Product Expiration Date: 202307202359
Blood Product Expiration Date: 202307202359
Blood Product Expiration Date: 202307202359
Blood Product Expiration Date: 202307202359
Blood Product Expiration Date: 202307202359
Blood Product Expiration Date: 202307212359
Blood Product Expiration Date: 202307212359
Blood Product Expiration Date: 202307212359
Blood Product Expiration Date: 202307262359
Blood Product Expiration Date: 202307262359
Blood Product Expiration Date: 202307262359
Blood Product Expiration Date: 202307262359
Blood Product Expiration Date: 202307262359
Blood Product Expiration Date: 202307272359
Blood Product Expiration Date: 202307272359
Blood Product Expiration Date: 202307272359
Blood Product Expiration Date: 202307272359
Blood Product Expiration Date: 202307272359
Blood Product Expiration Date: 202307272359
Blood Product Expiration Date: 202307272359
Blood Product Expiration Date: 202307272359
Blood Product Expiration Date: 202308072359
Blood Product Expiration Date: 202308072359
Blood Product Expiration Date: 202308072359
Blood Product Expiration Date: 202308072359
Blood Product Expiration Date: 202308122359
Blood Product Expiration Date: 202308122359
Blood Product Expiration Date: 202308122359
Blood Product Expiration Date: 202308122359
ISSUE DATE / TIME: 202307111750
ISSUE DATE / TIME: 202307111805
ISSUE DATE / TIME: 202307111810
ISSUE DATE / TIME: 202307111810
ISSUE DATE / TIME: 202307111810
ISSUE DATE / TIME: 202307111810
ISSUE DATE / TIME: 202307111810
ISSUE DATE / TIME: 202307111810
ISSUE DATE / TIME: 202307111810
ISSUE DATE / TIME: 202307111810
ISSUE DATE / TIME: 202307111830
ISSUE DATE / TIME: 202307112103
ISSUE DATE / TIME: 202307112103
ISSUE DATE / TIME: 202307120207
ISSUE DATE / TIME: 202307120702
ISSUE DATE / TIME: 202307120846
ISSUE DATE / TIME: 202307120905
ISSUE DATE / TIME: 202307120934
ISSUE DATE / TIME: 202307120934
ISSUE DATE / TIME: 202307120934
ISSUE DATE / TIME: 202307121042
ISSUE DATE / TIME: 202307121117
ISSUE DATE / TIME: 202307121958
ISSUE DATE / TIME: 202307122342
ISSUE DATE / TIME: 202307130601
ISSUE DATE / TIME: 202307130621
ISSUE DATE / TIME: 202307130832
ISSUE DATE / TIME: 202307130848
ISSUE DATE / TIME: 202307130848
ISSUE DATE / TIME: 202307130848
ISSUE DATE / TIME: 202307130848
Unit Type and Rh: 5100
Unit Type and Rh: 5100
Unit Type and Rh: 5100
Unit Type and Rh: 5100
Unit Type and Rh: 5100
Unit Type and Rh: 5100
Unit Type and Rh: 5100
Unit Type and Rh: 5100
Unit Type and Rh: 5100
Unit Type and Rh: 5100
Unit Type and Rh: 5100
Unit Type and Rh: 5100
Unit Type and Rh: 5100
Unit Type and Rh: 5100
Unit Type and Rh: 5100
Unit Type and Rh: 5100
Unit Type and Rh: 5100
Unit Type and Rh: 5100
Unit Type and Rh: 6200
Unit Type and Rh: 6200
Unit Type and Rh: 6200
Unit Type and Rh: 6200
Unit Type and Rh: 6200
Unit Type and Rh: 6200
Unit Type and Rh: 6200
Unit Type and Rh: 6200
Unit Type and Rh: 6200
Unit Type and Rh: 6200
Unit Type and Rh: 6200
Unit Type and Rh: 6200
Unit Type and Rh: 6200

## 2021-08-09 LAB — CBC
HCT: 22.7 % — ABNORMAL LOW (ref 39.0–52.0)
HCT: 20.5 % — ABNORMAL LOW (ref 39.0–52.0)
HCT: 21.6 % — ABNORMAL LOW (ref 39.0–52.0)
HCT: 19.9 % — ABNORMAL LOW (ref 39.0–52.0)
Hemoglobin: 7.3 g/dL — ABNORMAL LOW (ref 13.0–17.0)
Hemoglobin: 6.7 g/dL — CL (ref 13.0–17.0)
Hemoglobin: 7.1 g/dL — ABNORMAL LOW (ref 13.0–17.0)
Hemoglobin: 6.7 g/dL — CL (ref 13.0–17.0)
MCH: 29.3 pg (ref 26.0–34.0)
MCH: 29.4 pg (ref 26.0–34.0)
MCH: 29.5 pg (ref 26.0–34.0)
MCH: 30.2 pg (ref 26.0–34.0)
MCHC: 32.2 g/dL (ref 30.0–36.0)
MCHC: 32.7 g/dL (ref 30.0–36.0)
MCHC: 32.9 g/dL (ref 30.0–36.0)
MCHC: 33.7 g/dL (ref 30.0–36.0)
MCV: 91.2 fL (ref 80.0–100.0)
MCV: 89.9 fL (ref 80.0–100.0)
MCV: 89.6 fL (ref 80.0–100.0)
MCV: 89.6 fL (ref 80.0–100.0)
Platelets: 89 10*3/uL — ABNORMAL LOW (ref 150–400)
Platelets: 87 10*3/uL — ABNORMAL LOW (ref 150–400)
Platelets: 91 10*3/uL — ABNORMAL LOW (ref 150–400)
Platelets: 93 10*3/uL — ABNORMAL LOW (ref 150–400)
RBC: 2.49 MIL/uL — ABNORMAL LOW (ref 4.22–5.81)
RBC: 2.28 MIL/uL — ABNORMAL LOW (ref 4.22–5.81)
RBC: 2.41 MIL/uL — ABNORMAL LOW (ref 4.22–5.81)
RBC: 2.22 MIL/uL — ABNORMAL LOW (ref 4.22–5.81)
RDW: 17.2 % — ABNORMAL HIGH (ref 11.5–15.5)
RDW: 17.2 % — ABNORMAL HIGH (ref 11.5–15.5)
RDW: 16.9 % — ABNORMAL HIGH (ref 11.5–15.5)
RDW: 17.2 % — ABNORMAL HIGH (ref 11.5–15.5)
WBC: 6.9 10*3/uL (ref 4.0–10.5)
WBC: 6.3 10*3/uL (ref 4.0–10.5)
WBC: 6.9 10*3/uL (ref 4.0–10.5)
WBC: 6.5 10*3/uL (ref 4.0–10.5)
nRBC: 0.3 % — ABNORMAL HIGH (ref 0.0–0.2)
nRBC: 0.3 % — ABNORMAL HIGH (ref 0.0–0.2)
nRBC: 0.6 % — ABNORMAL HIGH (ref 0.0–0.2)
nRBC: 0.6 % — ABNORMAL HIGH (ref 0.0–0.2)

## 2021-08-09 LAB — BASIC METABOLIC PANEL
Anion gap: 3 — ABNORMAL LOW (ref 5–15)
Anion gap: 7 (ref 5–15)
BUN: 9 mg/dL (ref 6–20)
BUN: 20 mg/dL (ref 6–20)
CO2: 26 mmol/L (ref 22–32)
CO2: 24 mmol/L (ref 22–32)
Calcium: 7.8 mg/dL — ABNORMAL LOW (ref 8.9–10.3)
Calcium: 7.7 mg/dL — ABNORMAL LOW (ref 8.9–10.3)
Chloride: 128 mmol/L — ABNORMAL HIGH (ref 98–111)
Chloride: 125 mmol/L — ABNORMAL HIGH (ref 98–111)
Creatinine, Ser: 1.17 mg/dL (ref 0.61–1.24)
Creatinine, Ser: 1.57 mg/dL — ABNORMAL HIGH (ref 0.61–1.24)
GFR, Estimated: >60 mL/min (ref >60)
GFR, Estimated: >60 mL/min (ref >60)
Glucose, Bld: 127 mg/dL — ABNORMAL HIGH (ref 70–99)
Glucose, Bld: 116 mg/dL — ABNORMAL HIGH (ref 70–99)
Potassium: 3.9 mmol/L (ref 3.5–5.1)
Potassium: 3.9 mmol/L (ref 3.5–5.1)
Sodium: 157 mmol/L — ABNORMAL HIGH (ref 135–145)
Sodium: 156 mmol/L — ABNORMAL HIGH (ref 135–145)

## 2021-08-09 LAB — TYPE AND SCREEN
ABO/RH(D): A POS
Antibody Screen: NEGATIVE
Unit division: 0
Unit division: 0
Unit division: 0
Unit division: 0
Unit division: 0
Unit division: 0
Unit division: 0
Unit division: 0
Unit division: 0
Unit division: 0
Unit division: 0
Unit division: 0
Unit division: 0
Unit division: 0
Unit division: 0
Unit division: 0
Unit division: 0
Unit division: 0
Unit division: 0
Unit division: 0
Unit division: 0
Unit division: 0
Unit division: 0
Unit division: 0
Unit division: 0
Unit division: 0
Unit division: 0
Unit division: 0
Unit division: 0
Unit division: 0
Unit division: 0

## 2021-08-09 LAB — HEPATIC FUNCTION PANEL
ALT: 52 U/L — ABNORMAL HIGH (ref 0–44)
AST: 102 U/L — ABNORMAL HIGH (ref 15–41)
Albumin: 2.3 g/dL — ABNORMAL LOW (ref 3.5–5.0)
Alkaline Phosphatase: 83 U/L (ref 38–126)
Bilirubin, Direct: 0.9 mg/dL — ABNORMAL HIGH (ref 0.0–0.2)
Indirect Bilirubin: 1 mg/dL — ABNORMAL HIGH (ref 0.3–0.9)
Total Bilirubin: 1.9 mg/dL — ABNORMAL HIGH (ref 0.3–1.2)
Total Protein: 4.6 g/dL — ABNORMAL LOW (ref 6.5–8.1)

## 2021-08-09 LAB — GLUCOSE, CAPILLARY
Glucose-Capillary: 115 mg/dL — ABNORMAL HIGH (ref 70–99)
Glucose-Capillary: 108 mg/dL — ABNORMAL HIGH (ref 70–99)
Glucose-Capillary: 108 mg/dL — ABNORMAL HIGH (ref 70–99)
Glucose-Capillary: 107 mg/dL — ABNORMAL HIGH (ref 70–99)
Glucose-Capillary: 105 mg/dL — ABNORMAL HIGH (ref 70–99)

## 2021-08-09 LAB — PROTIME-INR
INR: 1.4 — ABNORMAL HIGH (ref 0.8–1.2)
Prothrombin Time: 17.4 seconds — ABNORMAL HIGH (ref 11.4–15.2)

## 2021-08-09 LAB — FIBRINOGEN: Fibrinogen: 576 mg/dL — ABNORMAL HIGH (ref 210–475)

## 2021-08-09 LAB — APTT
aPTT: 48 seconds — ABNORMAL HIGH (ref 24–36)
aPTT: 38 seconds — ABNORMAL HIGH (ref 24–36)

## 2021-08-09 LAB — HEPARIN LEVEL (UNFRACTIONATED)
Heparin Unfractionated: <0.1 IU/mL — ABNORMAL LOW (ref 0.30–0.70)
Heparin Unfractionated: <0.1 IU/mL — ABNORMAL LOW (ref 0.30–0.70)

## 2021-08-09 LAB — PREPARE RBC (CROSSMATCH)

## 2021-08-09 LAB — PHOSPHORUS: Phosphorus: 4.1 mg/dL (ref 2.5–4.6)

## 2021-08-09 LAB — MAGNESIUM: Magnesium: 2.3 mg/dL (ref 1.7–2.4)

## 2021-08-09 LAB — LACTATE DEHYDROGENASE: LDH: 353 U/L — ABNORMAL HIGH (ref 98–192)

## 2021-08-09 MED ORDER — PROSOURCE TF PO LIQD
90.0000 mL | Freq: Every day | ORAL | Status: DC
  Administered 2021-08-09 – 2021-08-14 (×24): 90 mL
  Filled 2021-08-09 (×23): qty 90

## 2021-08-09 MED ORDER — SODIUM CHLORIDE 0.9% IV SOLUTION: Freq: Once | INTRAVENOUS | Status: AC

## 2021-08-09 MED ORDER — METOPROLOL TARTRATE 5 MG/5ML IV SOLN
2.5000 mg | Freq: Once | INTRAVENOUS | Status: AC
  Administered 2021-08-09: 2.5 mg via INTRAVENOUS
  Filled 2021-08-09: qty 5

## 2021-08-09 MED ORDER — TRANEXAMIC ACID FOR INHALATION
500.0000 mg | Freq: Three times a day (TID) | RESPIRATORY_TRACT | Status: DC
Start: 2021-08-09 — End: 2021-08-13
  Administered 2021-08-09 – 2021-08-13 (×12): 500 mg via RESPIRATORY_TRACT
  Filled 2021-08-09: qty 10
  Filled 2021-08-09: qty 5
  Filled 2021-08-09 (×4): qty 10
  Filled 2021-08-09: qty 5
  Filled 2021-08-09 (×3): qty 10
  Filled 2021-08-09: qty 5
  Filled 2021-08-09 (×2): qty 10

## 2021-08-09 MED ORDER — VANCOMYCIN HCL 1750 MG/350ML IV SOLN
1750.0000 mg | Freq: Once | INTRAVENOUS | Status: DC
  Filled 2021-08-09: qty 350

## 2021-08-09 MED ORDER — VASOPRESSIN 20 UNITS/100 ML INFUSION FOR SHOCK
0.0000 [IU]/min | INTRAVENOUS | Status: DC
  Administered 2021-08-09: 0.03 [IU]/min via INTRAVENOUS
  Filled 2021-08-09 (×2): qty 100

## 2021-08-09 MED ORDER — VANCOMYCIN HCL 1500 MG/300ML IV SOLN
1500.0000 mg | INTRAVENOUS | Status: DC
Start: 2021-08-10 — End: 2021-08-13
  Administered 2021-08-10 – 2021-08-13 (×4): 1500 mg via INTRAVENOUS
  Filled 2021-08-09 (×4): qty 300

## 2021-08-09 MED ORDER — INSULIN ASPART 100 UNIT/ML IJ SOLN
1.0000 [IU] | INTRAMUSCULAR | Status: DC
  Administered 2021-08-11 (×2): 1 [IU] via SUBCUTANEOUS
  Administered 2021-08-12 (×2): 2 [IU] via SUBCUTANEOUS
  Administered 2021-08-13 (×2): 3 [IU] via SUBCUTANEOUS
  Administered 2021-08-13: 2 [IU] via SUBCUTANEOUS
  Administered 2021-08-13 (×2): 3 [IU] via SUBCUTANEOUS
  Administered 2021-08-13: 2 [IU] via SUBCUTANEOUS
  Administered 2021-08-13: 1 [IU] via SUBCUTANEOUS
  Administered 2021-08-14: 3 [IU] via SUBCUTANEOUS
  Administered 2021-08-14: 1 [IU] via SUBCUTANEOUS
  Administered 2021-08-14: 2 [IU] via SUBCUTANEOUS

## 2021-08-09 MED ORDER — VANCOMYCIN HCL 2000 MG/400ML IV SOLN
2000.0000 mg | Freq: Once | INTRAVENOUS | Status: AC
  Administered 2021-08-09: 2000 mg via INTRAVENOUS
  Filled 2021-08-09: qty 400

## 2021-08-09 MED ORDER — LACTATED RINGERS IV BOLUS
1000.0000 mL | Freq: Once | INTRAVENOUS | Status: AC
  Administered 2021-08-09: 1000 mL via INTRAVENOUS

## 2021-08-09 MED ORDER — FREE WATER
200.0000 mL | Status: DC
  Administered 2021-08-09 – 2021-08-20 (×65): 200 mL

## 2021-08-09 MED ORDER — HEPARIN (PORCINE) 25000 UT/250ML-% IV SOLN
900.0000 [IU]/h | INTRAVENOUS | Status: DC
  Administered 2021-08-09 – 2021-08-10 (×2): 600 [IU]/h via INTRAVENOUS
  Administered 2021-08-11 – 2021-08-12 (×3): 900 [IU]/h via INTRAVENOUS
  Filled 2021-08-09 (×3): qty 250
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
## 2021-08-09 NOTE — Progress Notes (Signed)
TEE performed-- ECMO cannula in appropriate position with concurrent ABG> PaO2 120. Small clot on distal end of crescent catheter. -resuming heparin gtt -adding TXA nebs today -KUB to reconfirm cortrak placement since TEE so TF can resume.  Afternoon labs reviewed> significant increased in Cr since starting toradol yesterday. Stopping toradol, bolus 1L LR.   Off pressors today, HR remains in 110-120s. Trial of low dose metoprolol to improve tachycardia.  Steffanie Dunn, DO 08/09/21 6:26 PM [facility]

## 2021-08-09 NOTE — Progress Notes (Signed)
  Patient with progressive hypoxia throughout day. Having mild hemoptysis.   TEE performed to check ECMO cannula positioning.   ECMO cannula in good position with good flow into RA. Thre is a small to moderate-sized clot on outflow port in RA. Questions possible PE but no RV strain on TEE.   Repeat ABG with paO2 improved to 124.   Will restart low-dose heparin.   May need bronch if oxygenation worsening.   D/w CCM   CCT 40 mins not including TEE time  Arvilla Meres, MD  6:24 PM
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
## 2021-08-09 NOTE — Progress Notes (Signed)
  Echocardiogram Echocardiogram Transesophageal has been performed.  Delcie Roch 08/09/2021, 6:21 PM
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
## 2021-08-09 NOTE — Progress Notes (Signed)
Assisted MD at bedside with Bronchoscopy.  Pt tolerated well. RT to monitor.

## 2021-08-09 NOTE — CV Procedure (Signed)
    TRANSESOPHAGEAL ECHOCARDIOGRAM   NAME:  Jeff [name]   MRN: [id number] DOB:  15-Aug-2000   ADMIT DATE: 08/05/2021  INDICATIONS:  Respiratory failure  PROCEDURE:   Informed consent was obtained prior to the procedure from patient's father. The risks, benefits and alternatives for the procedure were discussed and the patient comprehended these risks.  Risks include, but are not limited to, cough, sore throat, vomiting, nausea, somnolence, esophageal and stomach trauma or perforation, bleeding, low blood pressure, aspiration, pneumonia, infection, trauma to the teeth and death.    The patient was already sedated on the vent. After a procedural time-out, the patient was further sedated.  The transesophageal probe was inserted in the esophagus and stomach without difficulty and multiple views were obtained.    COMPLICATIONS:    There were no immediate complications.  FINDINGS:  LEFT VENTRICLE: EF = 70-75%. No regional wall motion abnormalities.  RIGHT VENTRICLE: Normal size and function.   LEFT ATRIUM: Normal  LEFT ATRIAL APPENDAGE: Not visualized  RIGHT ATRIUM: Normal. Good flow from ECM cannula. There was a moderate-sized thrombus on ECMO out flow cannula  AORTIC VALVE:  Trileaflet. No AI/AS  MITRAL VALVE:    Normal. No MR  TRICUSPID VALVE: Normal. No TR  PULMONIC VALVE: Grossly normal. Trivial PI  INTERATRIAL SEPTUM: No PFO or ASD.  PERICARDIUM: No effusion  DESCENDING AORTA: Normal   CONCLUSION:  ECMO cannula in good position. Small to moderate-sized thrombus on ECMO cannula at outflow port.   Restart heparin.   Truman Hayward 6:17 PM
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
## 2021-08-09 NOTE — Procedures (Signed)
Bronchoscopy Procedure Note  Jeff [name]  [id number]  [birth year]  Date:08/09/21  Time:6:59 PM   Provider Performing:Jahayra Mazo P Chestine Spore   Procedure(s):  Initial Therapeutic Aspiration of Tracheobronchial Tree [phone])  Indication(s) hemoptysis  Consent Risks of the procedure as well as the alternatives and risks of each were explained to the patient and/or caregiver.  Consent for the procedure was obtained and is signed in the bedside chart- father consented  Anesthesia Per MAR   Time Out Verified patient identification, verified procedure, site/side was marked, verified correct patient position, special equipment/implants available, medications/allergies/relevant history reviewed, required imaging and test results available.   Sterile Technique Usual hand hygiene, masks, gowns, and gloves were used   Procedure Description Bronchoscope advanced through endotracheal tube and into airway.  Airways were examined down to subsegmental level with findings noted below.  Bloody secretions noted in all airways, blood dark and likely old rather than fresh bleeding. Initially bloody clots in ETT that had to be pushed through to advance scope. No purulent secretions.  Findings: blood in all airways, does not appear to be brisk bleeding if it is ongoing   Complications/Tolerance None; patient tolerated the procedure well. Chest X-ray is not needed post procedure.   EBL Minimal   Specimen(s) None  Father updated at bedside again after the procedure.  Steffanie Dunn, DO 08/09/21 7:01 PM [facility]

## 2021-08-10 ENCOUNTER — Inpatient Hospital Stay (HOSPITAL_COMMUNITY): Payer: Medicaid Other

## 2021-08-10 DIAGNOSIS — J9621 Acute and chronic respiratory failure with hypoxia: Secondary | ICD-10-CM | POA: Diagnosis not present

## 2021-08-10 DIAGNOSIS — Z9281 Personal history of extracorporeal membrane oxygenation (ECMO): Secondary | ICD-10-CM

## 2021-08-10 DIAGNOSIS — N179 Acute kidney failure, unspecified: Secondary | ICD-10-CM

## 2021-08-10 DIAGNOSIS — S27321A Contusion of lung, unilateral, initial encounter: Secondary | ICD-10-CM | POA: Diagnosis not present

## 2021-08-10 DIAGNOSIS — J80 Acute respiratory distress syndrome: Secondary | ICD-10-CM | POA: Diagnosis not present

## 2021-08-10 DIAGNOSIS — J9622 Acute and chronic respiratory failure with hypercapnia: Secondary | ICD-10-CM | POA: Diagnosis not present

## 2021-08-10 DIAGNOSIS — Z9911 Dependence on respirator [ventilator] status: Secondary | ICD-10-CM | POA: Diagnosis not present

## 2021-08-10 DIAGNOSIS — S299XXA Unspecified injury of thorax, initial encounter: Secondary | ICD-10-CM | POA: Diagnosis not present

## 2021-08-10 LAB — POCT I-STAT 7, (LYTES, BLD GAS, ICA,H+H)
Acid-Base Excess: 1 mmol/L (ref 0.0–2.0)
Acid-base deficit: 2 mmol/L (ref 0.0–2.0)
Acid-base deficit: 2 mmol/L (ref 0.0–2.0)
Acid-base deficit: 3 mmol/L — ABNORMAL HIGH (ref 0.0–2.0)
Acid-base deficit: 3 mmol/L — ABNORMAL HIGH (ref 0.0–2.0)
Acid-base deficit: 3 mmol/L — ABNORMAL HIGH (ref 0.0–2.0)
Acid-base deficit: 4 mmol/L — ABNORMAL HIGH (ref 0.0–2.0)
Acid-base deficit: 3 mmol/L — ABNORMAL HIGH (ref 0.0–2.0)
Acid-base deficit: 4 mmol/L — ABNORMAL HIGH (ref 0.0–2.0)
Acid-base deficit: 2 mmol/L (ref 0.0–2.0)
Acid-base deficit: 4 mmol/L — ABNORMAL HIGH (ref 0.0–2.0)
Bicarbonate: 24.1 mmol/L (ref 20.0–28.0)
Bicarbonate: 23 mmol/L (ref 20.0–28.0)
Bicarbonate: 23.2 mmol/L (ref 20.0–28.0)
Bicarbonate: 22.8 mmol/L (ref 20.0–28.0)
Bicarbonate: 23.4 mmol/L (ref 20.0–28.0)
Bicarbonate: 23 mmol/L (ref 20.0–28.0)
Bicarbonate: 23.6 mmol/L (ref 20.0–28.0)
Bicarbonate: 25.1 mmol/L (ref 20.0–28.0)
Bicarbonate: 22.8 mmol/L (ref 20.0–28.0)
Bicarbonate: 23.3 mmol/L (ref 20.0–28.0)
Bicarbonate: 22.5 mmol/L (ref 20.0–28.0)
Calcium, Ion: 1.25 mmol/L (ref 1.15–1.40)
Calcium, Ion: 1.24 mmol/L (ref 1.15–1.40)
Calcium, Ion: 1.24 mmol/L (ref 1.15–1.40)
Calcium, Ion: 1.23 mmol/L (ref 1.15–1.40)
Calcium, Ion: 1.27 mmol/L (ref 1.15–1.40)
Calcium, Ion: 1.26 mmol/L (ref 1.15–1.40)
Calcium, Ion: 1.18 mmol/L (ref 1.15–1.40)
Calcium, Ion: 1.24 mmol/L (ref 1.15–1.40)
Calcium, Ion: 1.27 mmol/L (ref 1.15–1.40)
Calcium, Ion: 1.25 mmol/L (ref 1.15–1.40)
Calcium, Ion: 1.25 mmol/L (ref 1.15–1.40)
HCT: 23 % — ABNORMAL LOW (ref 39.0–52.0)
HCT: 23 % — ABNORMAL LOW (ref 39.0–52.0)
HCT: 26 % — ABNORMAL LOW (ref 39.0–52.0)
HCT: 23 % — ABNORMAL LOW (ref 39.0–52.0)
HCT: 25 % — ABNORMAL LOW (ref 39.0–52.0)
HCT: 20 % — ABNORMAL LOW (ref 39.0–52.0)
HCT: 19 % — ABNORMAL LOW (ref 39.0–52.0)
HCT: 24 % — ABNORMAL LOW (ref 39.0–52.0)
HCT: 23 % — ABNORMAL LOW (ref 39.0–52.0)
HCT: 22 % — ABNORMAL LOW (ref 39.0–52.0)
HCT: 22 % — ABNORMAL LOW (ref 39.0–52.0)
Hemoglobin: 7.8 g/dL — ABNORMAL LOW (ref 13.0–17.0)
Hemoglobin: 7.8 g/dL — ABNORMAL LOW (ref 13.0–17.0)
Hemoglobin: 8.8 g/dL — ABNORMAL LOW (ref 13.0–17.0)
Hemoglobin: 7.8 g/dL — ABNORMAL LOW (ref 13.0–17.0)
Hemoglobin: 8.5 g/dL — ABNORMAL LOW (ref 13.0–17.0)
Hemoglobin: 6.8 g/dL — CL (ref 13.0–17.0)
Hemoglobin: 6.5 g/dL — CL (ref 13.0–17.0)
Hemoglobin: 8.2 g/dL — ABNORMAL LOW (ref 13.0–17.0)
Hemoglobin: 7.8 g/dL — ABNORMAL LOW (ref 13.0–17.0)
Hemoglobin: 7.5 g/dL — ABNORMAL LOW (ref 13.0–17.0)
Hemoglobin: 7.5 g/dL — ABNORMAL LOW (ref 13.0–17.0)
O2 Saturation: 89 %
O2 Saturation: 91 %
O2 Saturation: 94 %
O2 Saturation: 88 %
O2 Saturation: 87 %
O2 Saturation: 79 %
O2 Saturation: 100 %
O2 Saturation: 84 %
O2 Saturation: 80 %
O2 Saturation: 89 %
O2 Saturation: 83 %
Patient temperature: 36.8
Patient temperature: 36.8
Patient temperature: 36.5
Patient temperature: 36.7
Patient temperature: 36.8
Patient temperature: 36.9
Patient temperature: 36.8
Patient temperature: 36.9
Patient temperature: 36.9
Patient temperature: 36.9
Patient temperature: 37.2
Potassium: 3.7 mmol/L (ref 3.5–5.1)
Potassium: 3.6 mmol/L (ref 3.5–5.1)
Potassium: 3.7 mmol/L (ref 3.5–5.1)
Potassium: 3.7 mmol/L (ref 3.5–5.1)
Potassium: 3.6 mmol/L (ref 3.5–5.1)
Potassium: 3.6 mmol/L (ref 3.5–5.1)
Potassium: 3.8 mmol/L (ref 3.5–5.1)
Potassium: 3.9 mmol/L (ref 3.5–5.1)
Potassium: 3.8 mmol/L (ref 3.5–5.1)
Potassium: 3.7 mmol/L (ref 3.5–5.1)
Potassium: 3.4 mmol/L — ABNORMAL LOW (ref 3.5–5.1)
Sodium: 157 mmol/L — ABNORMAL HIGH (ref 135–145)
Sodium: 157 mmol/L — ABNORMAL HIGH (ref 135–145)
Sodium: 158 mmol/L — ABNORMAL HIGH (ref 135–145)
Sodium: 157 mmol/L — ABNORMAL HIGH (ref 135–145)
Sodium: 157 mmol/L — ABNORMAL HIGH (ref 135–145)
Sodium: 158 mmol/L — ABNORMAL HIGH (ref 135–145)
Sodium: 158 mmol/L — ABNORMAL HIGH (ref 135–145)
Sodium: 158 mmol/L — ABNORMAL HIGH (ref 135–145)
Sodium: 158 mmol/L — ABNORMAL HIGH (ref 135–145)
Sodium: 158 mmol/L — ABNORMAL HIGH (ref 135–145)
Sodium: 158 mmol/L — ABNORMAL HIGH (ref 135–145)
TCO2: 26 mmol/L (ref 22–32)
TCO2: 24 mmol/L (ref 22–32)
TCO2: 25 mmol/L (ref 22–32)
TCO2: 24 mmol/L (ref 22–32)
TCO2: 25 mmol/L (ref 22–32)
TCO2: 24 mmol/L (ref 22–32)
TCO2: 25 mmol/L (ref 22–32)
TCO2: 26 mmol/L (ref 22–32)
TCO2: 24 mmol/L (ref 22–32)
TCO2: 25 mmol/L (ref 22–32)
TCO2: 24 mmol/L (ref 22–32)
pCO2 arterial: 47.2 mmHg (ref 32–48)
pCO2 arterial: 39.6 mmHg (ref 32–48)
pCO2 arterial: 46.3 mmHg (ref 32–48)
pCO2 arterial: 42.7 mmHg (ref 32–48)
pCO2 arterial: 45.8 mmHg (ref 32–48)
pCO2 arterial: 49 mmHg — ABNORMAL HIGH (ref 32–48)
pCO2 arterial: 34.4 mmHg (ref 32–48)
pCO2 arterial: 46.9 mmHg (ref 32–48)
pCO2 arterial: 47.5 mmHg (ref 32–48)
pCO2 arterial: 42.5 mmHg (ref 32–48)
pCO2 arterial: 44.7 mmHg (ref 32–48)
pH, Arterial: 7.315 — ABNORMAL LOW (ref 7.35–7.45)
pH, Arterial: 7.371 (ref 7.35–7.45)
pH, Arterial: 7.305 — ABNORMAL LOW (ref 7.35–7.45)
pH, Arterial: 7.333 — ABNORMAL LOW (ref 7.35–7.45)
pH, Arterial: 7.316 — ABNORMAL LOW (ref 7.35–7.45)
pH, Arterial: 7.279 — ABNORMAL LOW (ref 7.35–7.45)
pH, Arterial: 7.309 — ABNORMAL LOW (ref 7.35–7.45)
pH, Arterial: 7.471 — ABNORMAL HIGH (ref 7.35–7.45)
pH, Arterial: 7.289 — ABNORMAL LOW (ref 7.35–7.45)
pH, Arterial: 7.347 — ABNORMAL LOW (ref 7.35–7.45)
pH, Arterial: 7.31 — ABNORMAL LOW (ref 7.35–7.45)
pO2, Arterial: 61 mmHg — ABNORMAL LOW (ref 83–108)
pO2, Arterial: 62 mmHg — ABNORMAL LOW (ref 83–108)
pO2, Arterial: 77 mmHg — ABNORMAL LOW (ref 83–108)
pO2, Arterial: 57 mmHg — ABNORMAL LOW (ref 83–108)
pO2, Arterial: 58 mmHg — ABNORMAL LOW (ref 83–108)
pO2, Arterial: 49 mmHg — ABNORMAL LOW (ref 83–108)
pO2, Arterial: 53 mmHg — ABNORMAL LOW (ref 83–108)
pO2, Arterial: 539 mmHg — ABNORMAL HIGH (ref 83–108)
pO2, Arterial: 50 mmHg — ABNORMAL LOW (ref 83–108)
pO2, Arterial: 60 mmHg — ABNORMAL LOW (ref 83–108)
pO2, Arterial: 52 mmHg — ABNORMAL LOW (ref 83–108)

## 2021-08-10 LAB — CBC
HCT: 23.2 % — ABNORMAL LOW (ref 39.0–52.0)
HCT: 23.8 % — ABNORMAL LOW (ref 39.0–52.0)
HCT: 25.9 % — ABNORMAL LOW (ref 39.0–52.0)
Hemoglobin: 7.5 g/dL — ABNORMAL LOW (ref 13.0–17.0)
Hemoglobin: 7.8 g/dL — ABNORMAL LOW (ref 13.0–17.0)
Hemoglobin: 8.8 g/dL — ABNORMAL LOW (ref 13.0–17.0)
MCH: 29.3 pg (ref 26.0–34.0)
MCH: 29.5 pg (ref 26.0–34.0)
MCH: 29.6 pg (ref 26.0–34.0)
MCHC: 32.3 g/dL (ref 30.0–36.0)
MCHC: 32.8 g/dL (ref 30.0–36.0)
MCHC: 34 g/dL (ref 30.0–36.0)
MCV: 87.2 fL (ref 80.0–100.0)
MCV: 90.2 fL (ref 80.0–100.0)
MCV: 90.6 fL (ref 80.0–100.0)
Platelets: 107 10*3/uL — ABNORMAL LOW (ref 150–400)
Platelets: 109 10*3/uL — ABNORMAL LOW (ref 150–400)
Platelets: 99 10*3/uL — ABNORMAL LOW (ref 150–400)
RBC: 2.56 MIL/uL — ABNORMAL LOW (ref 4.22–5.81)
RBC: 2.64 MIL/uL — ABNORMAL LOW (ref 4.22–5.81)
RBC: 2.97 MIL/uL — ABNORMAL LOW (ref 4.22–5.81)
RDW: 17 % — ABNORMAL HIGH (ref 11.5–15.5)
RDW: 17.2 % — ABNORMAL HIGH (ref 11.5–15.5)
RDW: 17.5 % — ABNORMAL HIGH (ref 11.5–15.5)
WBC: 8 10*3/uL (ref 4.0–10.5)
WBC: 9 10*3/uL (ref 4.0–10.5)
WBC: 9.1 10*3/uL (ref 4.0–10.5)
nRBC: 1.1 % — ABNORMAL HIGH (ref 0.0–0.2)
nRBC: 1.3 % — ABNORMAL HIGH (ref 0.0–0.2)
nRBC: 2.2 % — ABNORMAL HIGH (ref 0.0–0.2)

## 2021-08-10 LAB — APTT
aPTT: 51 seconds — ABNORMAL HIGH (ref 24–36)
aPTT: 65 seconds — ABNORMAL HIGH (ref 24–36)

## 2021-08-10 LAB — BASIC METABOLIC PANEL
Anion gap: 8 (ref 5–15)
Anion gap: 5 (ref 5–15)
BUN: 30 mg/dL — ABNORMAL HIGH (ref 6–20)
BUN: 30 mg/dL — ABNORMAL HIGH (ref 6–20)
CO2: 24 mmol/L (ref 22–32)
CO2: 24 mmol/L (ref 22–32)
Calcium: 7.7 mg/dL — ABNORMAL LOW (ref 8.9–10.3)
Calcium: 7.7 mg/dL — ABNORMAL LOW (ref 8.9–10.3)
Chloride: 123 mmol/L — ABNORMAL HIGH (ref 98–111)
Chloride: 125 mmol/L — ABNORMAL HIGH (ref 98–111)
Creatinine, Ser: 1.57 mg/dL — ABNORMAL HIGH (ref 0.61–1.24)
Creatinine, Ser: 1.23 mg/dL (ref 0.61–1.24)
GFR, Estimated: >60 mL/min (ref >60)
GFR, Estimated: >60 mL/min (ref >60)
Glucose, Bld: 120 mg/dL — ABNORMAL HIGH (ref 70–99)
Glucose, Bld: 106 mg/dL — ABNORMAL HIGH (ref 70–99)
Potassium: 3.7 mmol/L (ref 3.5–5.1)
Potassium: 3.8 mmol/L (ref 3.5–5.1)
Sodium: 155 mmol/L — ABNORMAL HIGH (ref 135–145)
Sodium: 154 mmol/L — ABNORMAL HIGH (ref 135–145)

## 2021-08-10 LAB — POCT I-STAT EG7
Acid-base deficit: 2 mmol/L (ref 0.0–2.0)
Bicarbonate: 24.5 mmol/L (ref 20.0–28.0)
Calcium, Ion: 1.25 mmol/L (ref 1.15–1.40)
HCT: 20 % — ABNORMAL LOW (ref 39.0–52.0)
Hemoglobin: 6.8 g/dL — CL (ref 13.0–17.0)
O2 Saturation: 57 %
Patient temperature: 36.9
Potassium: 3.8 mmol/L (ref 3.5–5.1)
Sodium: 158 mmol/L — ABNORMAL HIGH (ref 135–145)
TCO2: 26 mmol/L (ref 22–32)
pCO2, Ven: 48.3 mmHg (ref 44–60)
pH, Ven: 7.312 (ref 7.25–7.43)
pO2, Ven: 33 mmHg (ref 32–45)

## 2021-08-10 LAB — COMPREHENSIVE METABOLIC PANEL
ALT: 63 U/L — ABNORMAL HIGH (ref 0–44)
AST: 75 U/L — ABNORMAL HIGH (ref 15–41)
Albumin: 2.7 g/dL — ABNORMAL LOW (ref 3.5–5.0)
Alkaline Phosphatase: 107 U/L (ref 38–126)
Anion gap: 3 — ABNORMAL LOW (ref 5–15)
BUN: 28 mg/dL — ABNORMAL HIGH (ref 6–20)
CO2: 23 mmol/L (ref 22–32)
Calcium: 7.6 mg/dL — ABNORMAL LOW (ref 8.9–10.3)
Chloride: 125 mmol/L — ABNORMAL HIGH (ref 98–111)
Creatinine, Ser: 1.12 mg/dL (ref 0.61–1.24)
GFR, Estimated: >60 mL/min (ref >60)
Glucose, Bld: 110 mg/dL — ABNORMAL HIGH (ref 70–99)
Potassium: 3.5 mmol/L (ref 3.5–5.1)
Sodium: 151 mmol/L — ABNORMAL HIGH (ref 135–145)
Total Bilirubin: 1.9 mg/dL — ABNORMAL HIGH (ref 0.3–1.2)
Total Protein: 4.6 g/dL — ABNORMAL LOW (ref 6.5–8.1)

## 2021-08-10 LAB — CULTURE, RESPIRATORY W GRAM STAIN: Culture: NO GROWTH

## 2021-08-10 LAB — CBC WITH DIFFERENTIAL/PLATELET
Abs Immature Granulocytes: 1.48 10*3/uL — ABNORMAL HIGH (ref 0.00–0.07)
Basophils Absolute: 0.1 10*3/uL (ref 0.0–0.1)
Basophils Relative: 1 %
Eosinophils Absolute: 0.6 10*3/uL — ABNORMAL HIGH (ref 0.0–0.5)
Eosinophils Relative: 6 %
HCT: 24.6 % — ABNORMAL LOW (ref 39.0–52.0)
Hemoglobin: 8.1 g/dL — ABNORMAL LOW (ref 13.0–17.0)
Immature Granulocytes: 16 %
Lymphocytes Relative: 24 %
Lymphs Abs: 2.3 10*3/uL (ref 0.7–4.0)
MCH: 28.9 pg (ref 26.0–34.0)
MCHC: 32.9 g/dL (ref 30.0–36.0)
MCV: 87.9 fL (ref 80.0–100.0)
Monocytes Absolute: 0.9 10*3/uL (ref 0.1–1.0)
Monocytes Relative: 10 %
Neutro Abs: 4.1 10*3/uL (ref 1.7–7.7)
Neutrophils Relative %: 43 %
Platelets: 109 10*3/uL — ABNORMAL LOW (ref 150–400)
RBC: 2.8 MIL/uL — ABNORMAL LOW (ref 4.22–5.81)
RDW: 18 % — ABNORMAL HIGH (ref 11.5–15.5)
Smear Review: NORMAL
WBC: 9.4 10*3/uL (ref 4.0–10.5)
nRBC: 2.8 % — ABNORMAL HIGH (ref 0.0–0.2)

## 2021-08-10 LAB — HEPATIC FUNCTION PANEL
ALT: 79 U/L — ABNORMAL HIGH (ref 0–44)
AST: 112 U/L — ABNORMAL HIGH (ref 15–41)
Albumin: 2.1 g/dL — ABNORMAL LOW (ref 3.5–5.0)
Alkaline Phosphatase: 108 U/L (ref 38–126)
Bilirubin, Direct: 0.8 mg/dL — ABNORMAL HIGH (ref 0.0–0.2)
Indirect Bilirubin: 1.2 mg/dL — ABNORMAL HIGH (ref 0.3–0.9)
Total Bilirubin: 2 mg/dL — ABNORMAL HIGH (ref 0.3–1.2)
Total Protein: 4.5 g/dL — ABNORMAL LOW (ref 6.5–8.1)

## 2021-08-10 LAB — GLUCOSE, CAPILLARY
Glucose-Capillary: 100 mg/dL — ABNORMAL HIGH (ref 70–99)
Glucose-Capillary: 100 mg/dL — ABNORMAL HIGH (ref 70–99)
Glucose-Capillary: 101 mg/dL — ABNORMAL HIGH (ref 70–99)
Glucose-Capillary: 105 mg/dL — ABNORMAL HIGH (ref 70–99)
Glucose-Capillary: 109 mg/dL — ABNORMAL HIGH (ref 70–99)
Glucose-Capillary: 117 mg/dL — ABNORMAL HIGH (ref 70–99)
Glucose-Capillary: 99 mg/dL (ref 70–99)

## 2021-08-10 LAB — LACTATE DEHYDROGENASE: LDH: 413 U/L — ABNORMAL HIGH (ref 98–192)

## 2021-08-10 LAB — PROTIME-INR
INR: 1.3 — ABNORMAL HIGH (ref 0.8–1.2)
Prothrombin Time: 16.5 seconds — ABNORMAL HIGH (ref 11.4–15.2)

## 2021-08-10 LAB — FIBRINOGEN: Fibrinogen: 473 mg/dL (ref 210–475)

## 2021-08-10 LAB — CK: Total CK: 2452 U/L — ABNORMAL HIGH (ref 49–397)

## 2021-08-10 LAB — HEPARIN LEVEL (UNFRACTIONATED)
Heparin Unfractionated: <0.1 IU/mL — ABNORMAL LOW (ref 0.30–0.70)
Heparin Unfractionated: <0.1 IU/mL — ABNORMAL LOW (ref 0.30–0.70)

## 2021-08-10 LAB — PREPARE RBC (CROSSMATCH)

## 2021-08-10 LAB — LACTIC ACID, PLASMA: Lactic Acid, Venous: 0.8 mmol/L (ref 0.5–1.9)

## 2021-08-10 MED ORDER — SODIUM CHLORIDE 0.9 % IV SOLN
1.0000 mg/kg/h | INTRAVENOUS | Status: DC
  Administered 2021-08-10 – 2021-08-11 (×7): 1 mg/kg/h via INTRAVENOUS
  Filled 2021-08-10 (×11): qty 5

## 2021-08-10 MED ORDER — PANTOPRAZOLE SODIUM 40 MG IV SOLR
40.0000 mg | Freq: Two times a day (BID) | INTRAVENOUS | Status: DC
  Administered 2021-08-10 – 2021-08-13 (×7): 40 mg via INTRAVENOUS
  Filled 2021-08-10 (×8): qty 10

## 2021-08-10 MED ORDER — METOPROLOL TARTRATE 5 MG/5ML IV SOLN
2.5000 mg | Freq: Once | INTRAVENOUS | Status: AC
  Administered 2021-08-10: 2.5 mg via INTRAVENOUS
  Filled 2021-08-10: qty 5

## 2021-08-10 MED ORDER — METOPROLOL TARTRATE 25 MG PO TABS: 25.0000 mg | ORAL_TABLET | Freq: Two times a day (BID) | ORAL | Status: DC

## 2021-08-10 MED ORDER — METOPROLOL TARTRATE 25 MG PO TABS
25.0000 mg | ORAL_TABLET | Freq: Two times a day (BID) | ORAL | Status: DC
  Administered 2021-08-10: 25 mg
  Filled 2021-08-10: qty 1

## 2021-08-10 MED ORDER — LACTATED RINGERS IV BOLUS
1000.0000 mL | Freq: Once | INTRAVENOUS | Status: AC
  Administered 2021-08-10: 1000 mL via INTRAVENOUS

## 2021-08-10 MED ORDER — SODIUM CHLORIDE 0.9% IV SOLUTION: Freq: Once | INTRAVENOUS | Status: AC

## 2021-08-10 MED ORDER — PANTOPRAZOLE SODIUM 40 MG PO TBEC
40.0000 mg | DELAYED_RELEASE_TABLET | Freq: Two times a day (BID) | ORAL | Status: DC
  Filled 2021-08-10 (×2): qty 1

## 2021-08-10 MED ORDER — POTASSIUM CHLORIDE 20 MEQ PO PACK
40.0000 meq | PACK | Freq: Once | ORAL | Status: AC
  Administered 2021-08-10: 40 meq
  Filled 2021-08-10: qty 2

## 2021-08-10 MED ORDER — METOPROLOL TARTRATE 5 MG/5ML IV SOLN
2.5000 mg | Freq: Once | INTRAVENOUS | Status: AC
Start: 2021-08-10 — End: 2021-08-10
  Administered 2021-08-10: 2.5 mg via INTRAVENOUS

## 2021-08-10 MED ORDER — ALBUMIN HUMAN 25 % IV SOLN
50.0000 g | Freq: Once | INTRAVENOUS | Status: AC
  Administered 2021-08-10: 50 g via INTRAVENOUS
  Filled 2021-08-10: qty 200
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
## 2021-08-10 NOTE — Progress Notes (Signed)
Trauma/Critical Care Follow Up Note  Subjective:    Overnight Issues: Off pressors. Worsening consolidation on CXR today. Mild tachycardia. Thrombus noted on cannula tip on TEE yesterday, heparin resumed. Transfused 2u PRBCs overnight for hgb 6.7.  Objective:  Vital signs for last 24 hours: Temp:  [96.8 F (36 C)-98.6 F (37 C)] 98.2 F (36.8 C) (07/16 1215) Pulse Rate:  [90-116] 90 (07/16 1215) Resp:  [8-21] 15 (07/16 1215) BP: (113-128)/(53-60) 113/53 (07/16 1105) SpO2:  [85 %-100 %] 90 % (07/16 1215) Arterial Line BP: (101-148)/(46-87) 101/55 (07/16 1215) FiO2 (%):  [40 %] 40 % (07/16 1200)  Hemodynamic parameters for last 24 hours:    Intake/Output from previous day: 07/15 0701 - 07/16 0700 In: 5045.9 [I.V.:1051.5; Blood:930; NG/GT:1071.5; IV Piggyback:1992.9] Out: 2980 [Urine:1200; Emesis/NG output:1150; Drains:330; Chest Tube:300]  Intake/Output this shift: Total I/O In: 1636.9 [I.V.:180.4; NG/GT:209.8; IV Piggyback:1246.7] Out: 730 [Urine:450; Emesis/NG output:150; Drains:60; Chest Tube:70]  Vent settings for last 24 hours: Vent Mode: PCV FiO2 (%):  [40 %] 40 % Set Rate:  [15 bmp] 15 bmp PEEP:  [12 cmH20] 12 cmH20 Plateau Pressure:  [21 cmH20-23 cmH20] 22 cmH20  Physical Exam:  Gen: no distress, sedated Neuro: sedated HEENT: pupils reactive Neck: supple, RIJ ECMO cannulae, LIJ CVC CV: RRR Pulm: unlabored breathing, L chest tube in place to suction with no air leak, serosanguinous drainage. No chest wall crepitus. Abd: soft, nondistended, nontender. GU: clear yellow urine Extr: wwp, no edema, vac to RLE, dressings in place bilateral upper extremities   Results for orders placed or performed during the hospital encounter of 08/05/21 (from the past 24 hour(s))  I-STAT 7, (LYTES, BLD GAS, ICA, H+H)     Status: Abnormal   Collection Time: 08/09/21  1:04 PM  Result Value Ref Range   pH, Arterial 7.309 (L) 7.35 - 7.45   pCO2 arterial 50.3 (H) 32 - 48 mmHg    pO2, Arterial 71 (L) 83 - 108 mmHg   Bicarbonate 25.3 20.0 - 28.0 mmol/L   TCO2 27 22 - 32 mmol/L   O2 Saturation 92 %   Acid-base deficit 1.0 0.0 - 2.0 mmol/L   Sodium 158 (H) 135 - 145 mmol/L   Potassium 3.9 3.5 - 5.1 mmol/L   Calcium, Ion 1.24 1.15 - 1.40 mmol/L   HCT 22.0 (L) 39.0 - 52.0 %   Hemoglobin 7.5 (L) 13.0 - 17.0 g/dL   Patient temperature 15.4 C    Collection site RADIAL, Layan Zalenski'S TEST ACCEPTABLE    Drawn by HIDE    Sample type ARTERIAL   I-STAT 7, (LYTES, BLD GAS, ICA, H+H)     Status: Abnormal   Collection Time: 08/09/21  2:12 PM  Result Value Ref Range   pH, Arterial 7.359 7.35 - 7.45   pCO2 arterial 43.1 32 - 48 mmHg   pO2, Arterial 69 (L) 83 - 108 mmHg   Bicarbonate 24.4 20.0 - 28.0 mmol/L   TCO2 26 22 - 32 mmol/L   O2 Saturation 93 %   Acid-base deficit 1.0 0.0 - 2.0 mmol/L   Sodium 156 (H) 135 - 145 mmol/L   Potassium 3.9 3.5 - 5.1 mmol/L   Calcium, Ion 1.25 1.15 - 1.40 mmol/L   HCT 21.0 (L) 39.0 - 52.0 %   Hemoglobin 7.1 (L) 13.0 - 17.0 g/dL   Patient temperature 00.8 C    Collection site RADIAL, Nykayla Marcelli'S TEST ACCEPTABLE    Drawn by HIDE    Sample type ARTERIAL   I-STAT 7, (  LYTES, BLD GAS, ICA, H+H)     Status: Abnormal   Collection Time: 08/09/21  3:09 PM  Result Value Ref Range   pH, Arterial 7.319 (L) 7.35 - 7.45   pCO2 arterial 48.7 (H) 32 - 48 mmHg   pO2, Arterial 34 (LL) 83 - 108 mmHg   Bicarbonate 25.1 20.0 - 28.0 mmol/L   TCO2 27 22 - 32 mmol/L   O2 Saturation 59 %   Acid-base deficit 1.0 0.0 - 2.0 mmol/L   Sodium 157 (H) 135 - 145 mmol/L   Potassium 3.8 3.5 - 5.1 mmol/L   Calcium, Ion 1.25 1.15 - 1.40 mmol/L   HCT 22.0 (L) 39.0 - 52.0 %   Hemoglobin 7.5 (L) 13.0 - 17.0 g/dL   Collection site HIDE    Drawn by HIDE    Sample type ARTERIAL    Comment NOTIFIED PHYSICIAN   I-STAT 7, (LYTES, BLD GAS, ICA, H+H)     Status: Abnormal   Collection Time: 08/09/21  3:17 PM  Result Value Ref Range   pH, Arterial 7.499 (H) 7.35 - 7.45   pCO2  arterial 33.9 32 - 48 mmHg   pO2, Arterial 593 (H) 83 - 108 mmHg   Bicarbonate 26.4 20.0 - 28.0 mmol/L   TCO2 27 22 - 32 mmol/L   O2 Saturation 100 %   Acid-Base Excess 3.0 (H) 0.0 - 2.0 mmol/L   Sodium 157 (H) 135 - 145 mmol/L   Potassium 3.8 3.5 - 5.1 mmol/L   Calcium, Ion 1.17 1.15 - 1.40 mmol/L   HCT 21.0 (L) 39.0 - 52.0 %   Hemoglobin 7.1 (L) 13.0 - 17.0 g/dL   Patient temperature 16.1 C    Collection site RADIAL, Barnabas Henriques'S TEST ACCEPTABLE    Drawn by HIDE    Sample type ECMO Circuit   I-STAT 7, (LYTES, BLD GAS, ICA, H+H)     Status: Abnormal   Collection Time: 08/09/21  3:25 PM  Result Value Ref Range   pH, Arterial 7.334 (L) 7.35 - 7.45   pCO2 arterial 45.2 32 - 48 mmHg   pO2, Arterial 63 (L) 83 - 108 mmHg   Bicarbonate 24.1 20.0 - 28.0 mmol/L   TCO2 26 22 - 32 mmol/L   O2 Saturation 90 %   Acid-base deficit 2.0 0.0 - 2.0 mmol/L   Sodium 158 (H) 135 - 145 mmol/L   Potassium 3.8 3.5 - 5.1 mmol/L   Calcium, Ion 1.24 1.15 - 1.40 mmol/L   HCT 21.0 (L) 39.0 - 52.0 %   Hemoglobin 7.1 (L) 13.0 - 17.0 g/dL   Patient temperature 09.6 C    Collection site RADIAL, Mong Neal'S TEST ACCEPTABLE    Drawn by HIDE    Sample type ARTERIAL   Glucose, capillary     Status: Abnormal   Collection Time: 08/09/21  3:26 PM  Result Value Ref Range   Glucose-Capillary 107 (H) 70 - 99 mg/dL  CBC     Status: Abnormal   Collection Time: 08/09/21  4:08 PM  Result Value Ref Range   WBC 6.9 4.0 - 10.5 K/uL   RBC 2.41 (L) 4.22 - 5.81 MIL/uL   Hemoglobin 7.1 (L) 13.0 - 17.0 g/dL   HCT 04.5 (L) 40.9 - 81.1 %   MCV 89.6 80.0 - 100.0 fL   MCH 29.5 26.0 - 34.0 pg   MCHC 32.9 30.0 - 36.0 g/dL   RDW 91.4 (H) 78.2 - 95.6 %   Platelets 91 (L) 150 - 400 K/uL  nRBC 0.6 (H) 0.0 - 0.2 %  Basic metabolic panel     Status: Abnormal   Collection Time: 08/09/21  4:08 PM  Result Value Ref Range   Sodium 156 (H) 135 - 145 mmol/L   Potassium 3.9 3.5 - 5.1 mmol/L   Chloride 125 (H) 98 - 111 mmol/L   CO2 24 22 -  32 mmol/L   Glucose, Bld 116 (H) 70 - 99 mg/dL   BUN 20 6 - 20 mg/dL   Creatinine, Ser 3.42 (H) 0.61 - 1.24 mg/dL   Calcium 7.7 (L) 8.9 - 10.3 mg/dL   GFR, Estimated >87 >68 mL/min   Anion gap 7 5 - 15  Heparin level (unfractionated)     Status: Abnormal   Collection Time: 08/09/21  4:08 PM  Result Value Ref Range   Heparin Unfractionated <0.10 (L) 0.30 - 0.70 IU/mL  APTT     Status: Abnormal   Collection Time: 08/09/21  4:08 PM  Result Value Ref Range   aPTT 38 (H) 24 - 36 seconds  I-STAT 7, (LYTES, BLD GAS, ICA, H+H)     Status: Abnormal   Collection Time: 08/09/21  6:12 PM  Result Value Ref Range   pH, Arterial 7.305 (L) 7.35 - 7.45   pCO2 arterial 49.8 (H) 32 - 48 mmHg   pO2, Arterial 123 (H) 83 - 108 mmHg   Bicarbonate 24.8 20.0 - 28.0 mmol/L   TCO2 26 22 - 32 mmol/L   O2 Saturation 98 %   Acid-base deficit 1.0 0.0 - 2.0 mmol/L   Sodium 157 (H) 135 - 145 mmol/L   Potassium 4.1 3.5 - 5.1 mmol/L   Calcium, Ion 1.23 1.15 - 1.40 mmol/L   HCT 19.0 (L) 39.0 - 52.0 %   Hemoglobin 6.5 (LL) 13.0 - 17.0 g/dL   Patient temperature 11.5 C    Sample type ARTERIAL    Comment NOTIFIED PHYSICIAN   I-STAT 7, (LYTES, BLD GAS, ICA, H+H)     Status: Abnormal   Collection Time: 08/09/21  6:48 PM  Result Value Ref Range   pH, Arterial 7.323 (L) 7.35 - 7.45   pCO2 arterial 48.2 (H) 32 - 48 mmHg   pO2, Arterial 138 (H) 83 - 108 mmHg   Bicarbonate 25.0 20.0 - 28.0 mmol/L   TCO2 26 22 - 32 mmol/L   O2 Saturation 99 %   Acid-base deficit 1.0 0.0 - 2.0 mmol/L   Sodium 156 (H) 135 - 145 mmol/L   Potassium 3.9 3.5 - 5.1 mmol/L   Calcium, Ion 1.25 1.15 - 1.40 mmol/L   HCT 21.0 (L) 39.0 - 52.0 %   Hemoglobin 7.1 (L) 13.0 - 17.0 g/dL   Patient temperature 72.6 C    Collection site RADIAL, Iris Hairston'S TEST ACCEPTABLE    Drawn by HIDE    Sample type ARTERIAL   Glucose, capillary     Status: Abnormal   Collection Time: 08/09/21  8:02 PM  Result Value Ref Range   Glucose-Capillary 105 (H) 70 - 99  mg/dL  I-STAT 7, (LYTES, BLD GAS, ICA, H+H)     Status: Abnormal   Collection Time: 08/09/21  8:05 PM  Result Value Ref Range   pH, Arterial 7.347 (L) 7.35 - 7.45   pCO2 arterial 42.9 32 - 48 mmHg   pO2, Arterial 61 (L) 83 - 108 mmHg   Bicarbonate 23.5 20.0 - 28.0 mmol/L   TCO2 25 22 - 32 mmol/L   O2 Saturation 90 %  Acid-base deficit 2.0 0.0 - 2.0 mmol/L   Sodium 157 (H) 135 - 145 mmol/L   Potassium 3.9 3.5 - 5.1 mmol/L   Calcium, Ion 1.25 1.15 - 1.40 mmol/L   HCT 17.0 (L) 39.0 - 52.0 %   Hemoglobin 5.8 (LL) 13.0 - 17.0 g/dL   Patient temperature 32.9 C    Sample type ARTERIAL    Comment VALUES EXPECTED, NO REPEAT   CBC     Status: Abnormal   Collection Time: 08/09/21  8:13 PM  Result Value Ref Range   WBC 6.5 4.0 - 10.5 K/uL   RBC 2.22 (L) 4.22 - 5.81 MIL/uL   Hemoglobin 6.7 (LL) 13.0 - 17.0 g/dL   HCT 51.8 (L) 84.1 - 66.0 %   MCV 89.6 80.0 - 100.0 fL   MCH 30.2 26.0 - 34.0 pg   MCHC 33.7 30.0 - 36.0 g/dL   RDW 63.0 (H) 16.0 - 10.9 %   Platelets 93 (L) 150 - 400 K/uL   nRBC 0.6 (H) 0.0 - 0.2 %  Prepare RBC (crossmatch)     Status: None   Collection Time: 08/09/21  9:18 PM  Result Value Ref Range   Order Confirmation      ORDER PROCESSED BY BLOOD BANK Performed at [facility], [address], [place], [place] [address]   Glucose, capillary     Status: Abnormal   Collection Time: 08/10/21 12:30 AM  Result Value Ref Range   Glucose-Capillary 105 (H) 70 - 99 mg/dL  CBC     Status: Abnormal   Collection Time: 08/10/21 12:32 AM  Result Value Ref Range   WBC 8.0 4.0 - 10.5 K/uL   RBC 2.64 (L) 4.22 - 5.81 MIL/uL   Hemoglobin 7.8 (L) 13.0 - 17.0 g/dL   HCT 73.2 (L) 20.2 - 54.2 %   MCV 90.2 80.0 - 100.0 fL   MCH 29.5 26.0 - 34.0 pg   MCHC 32.8 30.0 - 36.0 g/dL   RDW 70.6 (H) 23.7 - 62.8 %   Platelets 99 (L) 150 - 400 K/uL   nRBC 1.1 (H) 0.0 - 0.2 %  Prepare RBC (crossmatch)     Status: None   Collection Time: 08/10/21  1:11 AM  Result Value Ref Range   Order  Confirmation      BB SAMPLE OR UNITS ALREADY AVAILABLE Performed at [facility], [address], [place], [place] [address]   Glucose, capillary     Status: Abnormal   Collection Time: 08/10/21  4:41 AM  Result Value Ref Range   Glucose-Capillary 101 (H) 70 - 99 mg/dL  CBC     Status: Abnormal   Collection Time: 08/10/21  4:44 AM  Result Value Ref Range   WBC 9.0 4.0 - 10.5 K/uL   RBC 2.97 (L) 4.22 - 5.81 MIL/uL   Hemoglobin 8.8 (L) 13.0 - 17.0 g/dL   HCT 61.6 (L) 07.3 - 71.0 %   MCV 87.2 80.0 - 100.0 fL   MCH 29.6 26.0 - 34.0 pg   MCHC 34.0 30.0 - 36.0 g/dL   RDW 62.6 (H) 94.8 - 54.6 %   Platelets 107 (L) 150 - 400 K/uL   nRBC 1.3 (H) 0.0 - 0.2 %  Basic metabolic panel     Status: Abnormal   Collection Time: 08/10/21  4:44 AM  Result Value Ref Range   Sodium 155 (H) 135 - 145 mmol/L   Potassium 3.7 3.5 - 5.1 mmol/L   Chloride  123 (H) 98 - 111 mmol/L   CO2 24 22 - 32 mmol/L   Glucose, Bld 120 (H) 70 - 99 mg/dL   BUN 30 (H) 6 - 20 mg/dL   Creatinine, Ser 0.62 (H) 0.61 - 1.24 mg/dL   Calcium 7.7 (L) 8.9 - 10.3 mg/dL   GFR, Estimated >69 >48 mL/min   Anion gap 8 5 - 15  Lactate dehydrogenase     Status: Abnormal   Collection Time: 08/10/21  4:44 AM  Result Value Ref Range   LDH 413 (H) 98 - 192 U/L  Protime-INR     Status: Abnormal   Collection Time: 08/10/21  4:44 AM  Result Value Ref Range   Prothrombin Time 16.5 (H) 11.4 - 15.2 seconds   INR 1.3 (H) 0.8 - 1.2  Fibrinogen     Status: None   Collection Time: 08/10/21  4:44 AM  Result Value Ref Range   Fibrinogen 473 210 - 475 mg/dL  Hepatic function panel     Status: Abnormal   Collection Time: 08/10/21  4:44 AM  Result Value Ref Range   Total Protein 4.5 (L) 6.5 - 8.1 g/dL   Albumin 2.1 (L) 3.5 - 5.0 g/dL   AST 546 (H) 15 - 41 U/L   ALT 79 (H) 0 - 44 U/L   Alkaline Phosphatase 108 38 - 126 U/L   Total Bilirubin 2.0 (H) 0.3 - 1.2 mg/dL   Bilirubin, Direct 0.8 (H) 0.0 - 0.2 mg/dL   Indirect Bilirubin 1.2  (H) 0.3 - 0.9 mg/dL  Heparin level (unfractionated)     Status: Abnormal   Collection Time: 08/10/21  4:44 AM  Result Value Ref Range   Heparin Unfractionated <0.10 (L) 0.30 - 0.70 IU/mL  APTT     Status: Abnormal   Collection Time: 08/10/21  4:44 AM  Result Value Ref Range   aPTT 51 (H) 24 - 36 seconds  I-STAT 7, (LYTES, BLD GAS, ICA, H+H)     Status: Abnormal   Collection Time: 08/10/21  4:44 AM  Result Value Ref Range   pH, Arterial 7.315 (L) 7.35 - 7.45   pCO2 arterial 47.2 32 - 48 mmHg   pO2, Arterial 61 (L) 83 - 108 mmHg   Bicarbonate 24.1 20.0 - 28.0 mmol/L   TCO2 26 22 - 32 mmol/L   O2 Saturation 89 %   Acid-base deficit 2.0 0.0 - 2.0 mmol/L   Sodium 157 (H) 135 - 145 mmol/L   Potassium 3.7 3.5 - 5.1 mmol/L   Calcium, Ion 1.25 1.15 - 1.40 mmol/L   HCT 23.0 (L) 39.0 - 52.0 %   Hemoglobin 7.8 (L) 13.0 - 17.0 g/dL   Patient temperature 27.0 C    Sample type ARTERIAL   I-STAT 7, (LYTES, BLD GAS, ICA, H+H)     Status: Abnormal   Collection Time: 08/10/21  5:44 AM  Result Value Ref Range   pH, Arterial 7.371 7.35 - 7.45   pCO2 arterial 39.6 32 - 48 mmHg   pO2, Arterial 62 (L) 83 - 108 mmHg   Bicarbonate 23.0 20.0 - 28.0 mmol/L   TCO2 24 22 - 32 mmol/L   O2 Saturation 91 %   Acid-base deficit 2.0 0.0 - 2.0 mmol/L   Sodium 157 (H) 135 - 145 mmol/L   Potassium 3.6 3.5 - 5.1 mmol/L   Calcium, Ion 1.24 1.15 - 1.40 mmol/L   HCT 23.0 (L) 39.0 - 52.0 %   Hemoglobin 7.8 (L) 13.0 - 17.0  g/dL   Patient temperature 16.1 C    Sample type ARTERIAL   CK     Status: Abnormal   Collection Time: 08/10/21  7:50 AM  Result Value Ref Range   Total CK 2,452 (H) 49 - 397 U/L  Glucose, capillary     Status: Abnormal   Collection Time: 08/10/21 10:14 AM  Result Value Ref Range   Glucose-Capillary 100 (H) 70 - 99 mg/dL  I-STAT 7, (LYTES, BLD GAS, ICA, H+H)     Status: Abnormal   Collection Time: 08/10/21 10:15 AM  Result Value Ref Range   pH, Arterial 7.305 (L) 7.35 - 7.45   pCO2  arterial 46.3 32 - 48 mmHg   pO2, Arterial 77 (L) 83 - 108 mmHg   Bicarbonate 23.2 20.0 - 28.0 mmol/L   TCO2 25 22 - 32 mmol/L   O2 Saturation 94 %   Acid-base deficit 3.0 (H) 0.0 - 2.0 mmol/L   Sodium 158 (H) 135 - 145 mmol/L   Potassium 3.7 3.5 - 5.1 mmol/L   Calcium, Ion 1.24 1.15 - 1.40 mmol/L   HCT 26.0 (L) 39.0 - 52.0 %   Hemoglobin 8.8 (L) 13.0 - 17.0 g/dL   Patient temperature 09.6 C    Collection site RADIAL, Loraina Stauffer'S TEST ACCEPTABLE    Drawn by HIDE    Sample type ARTERIAL   Glucose, capillary     Status: Abnormal   Collection Time: 08/10/21 12:21 PM  Result Value Ref Range   Glucose-Capillary 117 (H) 70 - 99 mg/dL  I-STAT 7, (LYTES, BLD GAS, ICA, H+H)     Status: Abnormal   Collection Time: 08/10/21 12:24 PM  Result Value Ref Range   pH, Arterial 7.333 (L) 7.35 - 7.45   pCO2 arterial 42.7 32 - 48 mmHg   pO2, Arterial 57 (L) 83 - 108 mmHg   Bicarbonate 22.8 20.0 - 28.0 mmol/L   TCO2 24 22 - 32 mmol/L   O2 Saturation 88 %   Acid-base deficit 3.0 (H) 0.0 - 2.0 mmol/L   Sodium 157 (H) 135 - 145 mmol/L   Potassium 3.7 3.5 - 5.1 mmol/L   Calcium, Ion 1.23 1.15 - 1.40 mmol/L   HCT 23.0 (L) 39.0 - 52.0 %   Hemoglobin 7.8 (L) 13.0 - 17.0 g/dL   Patient temperature 04.5 C    Collection site RADIAL, Ramone Gander'S TEST ACCEPTABLE    Drawn by HIDE    Sample type ARTERIAL     Assessment & Plan: The plan of care was discussed with the bedside nurse for the day and in ECMO rounds, all are in agreement with this plan and no additional concerns were raised.   Present on Admission:  Trauma of chest  ARDS (adult respiratory distress syndrome) ([name])  Contusion of left lung  Acute on chronic respiratory failure with hypoxia and hypercapnia ([name])  Critical polytrauma  TBI (traumatic brain injury) ([name])    LOS: 5 days   Additional comments:I reviewed the patient's new clinical lab test results.   and I reviewed the patients new imaging test results.    75M [facility]   SDH, IVH, SAH -  NSGY c/s, Dr. Maurice Small, repeat CT head 7/12 some IVH but fairly stable, okay for above normal sodiums and for heparin as long as no bolus dosing. Head CT this morning with edema, but improvement in intraventricular hemorrhage. No midline shift. L rib fx 1-8 with L HPTX - L CT on suction, keep at -40. Pneumothorax small today, small  anterior pneumothorax remains on chest CT today. Extensive pulmonary consolidation with refractory hypoxemia - cannulated for VV-ECMO 7/11 Suspected aspiration - continue empiric meropenem/vanc Fracture dislocation L elbow/olecranon and ulnar styloid with comminuted fracture of the left midshaft and distal humerus - initial eval by Dr. Linna CapriceSwinteck, splinted, definitive care per Dr. Derinda SisHaddix/Ortho Trauma, OR 7/13 with Dr. Jena GaussHaddix I&D, CR L humerus  Comminuted fracture dislocation R hip - reduced in TB, KI in place, traction pin 7/13 by Dr. Jena GaussHaddix Complex lacerations RUE/RLE with degloving - operative washout and repair 7/13 by Dr. Jena GaussHaddix, vac to RLE, proximity to R hip makes fixation high risk  R comminuted first metacarpal fracture/open -irrigated and splinted by Dr. Linna CapriceSwinteck, hand surgery notified by EDP Coagulopathy - platelets up to 107 ID - continue meropenem and vanc Acute blood loss anemia - Transfused PRBCs overnight. No signs of active bleeding from vac or chest tube. FEN - TF via cortrak, ok to advance to goal now that patient is off pressors. Keep OG to suction, RN reports coffee ground drainage overnight. On PPI. DVT - SCDs, heparin gtt Dispo - ICU  Patient discussed with Dr. Gala RomneyBensimhon and Dr. Chestine Sporelark on multidisciplinary rounds.  Sophronia SimasShelby Rajah Tagliaferro, MD [facility] Hepatobiliary and Pancreatic Surgery 08/10/21 12:49 PM  *Care during the described time interval was provided by me. I have reviewed this patient's available data, including medical history, events of note, physical examination and test results as part of my evaluation.

## 2021-08-10 NOTE — Progress Notes (Signed)
Orthopedic Tech Progress Note Patient Details:  Jeff [name] 2000-11-02 [id number]  Musculoskeletal Traction Type of Traction: Skeletal (Balanced Suspension) Traction Location: RLE Traction Weight: 15 lbs   Post Interventions Patient Tolerated: Well Instructions Provided: Care of device Traction applied following patients return from CT.  Darleen Crocker 08/10/2021, 10:33 AM
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
## 2021-08-10 NOTE — Progress Notes (Signed)
Pt transported to and from CT on vent. Pt stable throughout with no complications.

## 2021-08-10 NOTE — Progress Notes (Signed)
Orthopedic Tech Progress Note Patient Details:  Jeff [name] 09/09/2000 [id number]  Patient ID: [name], male   DOB: [birth year], 21 y.o.   MRN: [id number] Traction removed for patient to be taken to CT, will apply once he returns to room.  Darleen Crocker 08/10/2021, 8:50 AM

## 2021-08-10 NOTE — Progress Notes (Signed)
Trauma Event Note    Reason for Call :  Assisted with transfer of pt from hospital bed to CT table and back- remained stable during procedure- ECMO RN and primary RN at the bedside, Ortho Tech assisted with traction for right leg.  TRN to follow.   Last imported Vital Signs BP (!) 113/53   Pulse 90   Temp 98.2 F (36.8 C)   Resp 15   Ht 5\' 8"  (1.727 m)   Wt 204 lb 2.3 oz (92.6 kg)   SpO2 90%   BMI 31.04 kg/m   Trending CBC Recent Labs    08/09/21 2013 08/10/21 0032 08/10/21 0444 08/10/21 0544 08/10/21 1015 08/10/21 1224  WBC 6.5 8.0 9.0  --   --   --   HGB 6.7* 7.8* 8.8*  7.8* 7.8* 8.8* 7.8*  HCT 19.9* 23.8* 25.9*  23.0* 23.0* 26.0* 23.0*  PLT 93* 99* 107*  --   --   --     Trending Coag's Recent Labs    08/08/21 0315 08/09/21 0356 08/09/21 1608 08/10/21 0444  APTT 39* 48* 38* 51*  INR 1.5* 1.4*  --  1.3*    Trending BMET Recent Labs    08/09/21 0356 08/09/21 0558 08/09/21 1608 08/09/21 1812 08/10/21 0444 08/10/21 0544 08/10/21 1015 08/10/21 1224  NA 157*   < > 156*   < > 155*  157* 157* 158* 157*  K 3.9   < > 3.9   < > 3.7  3.7 3.6 3.7 3.7  CL 128*  --  125*  --  123*  --   --   --   CO2 26  --  24  --  24  --   --   --   BUN 9  --  20  --  30*  --   --   --   CREATININE 1.17  --  1.57*  --  1.57*  --   --   --   GLUCOSE 127*  --  116*  --  120*  --   --   --    < > = values in this interval not displayed.      08/12/21 Adalene Gulotta  Trauma Response RN  Please call TRN at [phone] for further assistance.
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
## 2021-08-11 ENCOUNTER — Inpatient Hospital Stay (HOSPITAL_COMMUNITY): Payer: Medicaid Other

## 2021-08-11 ENCOUNTER — Encounter (HOSPITAL_COMMUNITY): Payer: Self-pay | Admitting: Student

## 2021-08-11 DIAGNOSIS — D649 Anemia, unspecified: Secondary | ICD-10-CM

## 2021-08-11 DIAGNOSIS — M7989 Other specified soft tissue disorders: Secondary | ICD-10-CM | POA: Diagnosis not present

## 2021-08-11 DIAGNOSIS — S27321A Contusion of lung, unilateral, initial encounter: Secondary | ICD-10-CM | POA: Diagnosis not present

## 2021-08-11 DIAGNOSIS — I10 Essential (primary) hypertension: Secondary | ICD-10-CM

## 2021-08-11 DIAGNOSIS — N179 Acute kidney failure, unspecified: Secondary | ICD-10-CM | POA: Diagnosis not present

## 2021-08-11 DIAGNOSIS — R7401 Elevation of levels of liver transaminase levels: Secondary | ICD-10-CM

## 2021-08-11 DIAGNOSIS — S299XXA Unspecified injury of thorax, initial encounter: Secondary | ICD-10-CM | POA: Diagnosis not present

## 2021-08-11 DIAGNOSIS — S27322A Contusion of lung, bilateral, initial encounter: Secondary | ICD-10-CM | POA: Diagnosis not present

## 2021-08-11 DIAGNOSIS — R609 Edema, unspecified: Secondary | ICD-10-CM

## 2021-08-11 DIAGNOSIS — E871 Hypo-osmolality and hyponatremia: Secondary | ICD-10-CM

## 2021-08-11 DIAGNOSIS — J80 Acute respiratory distress syndrome: Secondary | ICD-10-CM | POA: Diagnosis not present

## 2021-08-11 DIAGNOSIS — J9622 Acute and chronic respiratory failure with hypercapnia: Secondary | ICD-10-CM | POA: Diagnosis not present

## 2021-08-11 DIAGNOSIS — J9621 Acute and chronic respiratory failure with hypoxia: Secondary | ICD-10-CM | POA: Diagnosis not present

## 2021-08-11 DIAGNOSIS — R739 Hyperglycemia, unspecified: Secondary | ICD-10-CM

## 2021-08-11 LAB — POCT I-STAT 7, (LYTES, BLD GAS, ICA,H+H)
Acid-Base Excess: 1 mmol/L (ref 0.0–2.0)
Acid-base deficit: 1 mmol/L (ref 0.0–2.0)
Acid-base deficit: 1 mmol/L (ref 0.0–2.0)
Acid-base deficit: 1 mmol/L (ref 0.0–2.0)
Acid-base deficit: 1 mmol/L (ref 0.0–2.0)
Acid-base deficit: 2 mmol/L (ref 0.0–2.0)
Acid-base deficit: 2 mmol/L (ref 0.0–2.0)
Acid-base deficit: 3 mmol/L — ABNORMAL HIGH (ref 0.0–2.0)
Acid-base deficit: 3 mmol/L — ABNORMAL HIGH (ref 0.0–2.0)
Acid-base deficit: 3 mmol/L — ABNORMAL HIGH (ref 0.0–2.0)
Acid-base deficit: 3 mmol/L — ABNORMAL HIGH (ref 0.0–2.0)
Acid-base deficit: 4 mmol/L — ABNORMAL HIGH (ref 0.0–2.0)
Bicarbonate: 21.9 mmol/L (ref 20.0–28.0)
Bicarbonate: 22 mmol/L (ref 20.0–28.0)
Bicarbonate: 22.1 mmol/L (ref 20.0–28.0)
Bicarbonate: 22.4 mmol/L (ref 20.0–28.0)
Bicarbonate: 22.4 mmol/L (ref 20.0–28.0)
Bicarbonate: 22.7 mmol/L (ref 20.0–28.0)
Bicarbonate: 22.9 mmol/L (ref 20.0–28.0)
Bicarbonate: 23.2 mmol/L (ref 20.0–28.0)
Bicarbonate: 24.1 mmol/L (ref 20.0–28.0)
Bicarbonate: 24.2 mmol/L (ref 20.0–28.0)
Bicarbonate: 24.3 mmol/L (ref 20.0–28.0)
Bicarbonate: 26.1 mmol/L (ref 20.0–28.0)
Calcium, Ion: 1.25 mmol/L (ref 1.15–1.40)
Calcium, Ion: 1.26 mmol/L (ref 1.15–1.40)
Calcium, Ion: 1.26 mmol/L (ref 1.15–1.40)
Calcium, Ion: 1.27 mmol/L (ref 1.15–1.40)
Calcium, Ion: 1.27 mmol/L (ref 1.15–1.40)
Calcium, Ion: 1.27 mmol/L (ref 1.15–1.40)
Calcium, Ion: 1.28 mmol/L (ref 1.15–1.40)
Calcium, Ion: 1.29 mmol/L (ref 1.15–1.40)
Calcium, Ion: 1.29 mmol/L (ref 1.15–1.40)
Calcium, Ion: 1.29 mmol/L (ref 1.15–1.40)
Calcium, Ion: 1.3 mmol/L (ref 1.15–1.40)
Calcium, Ion: 1.32 mmol/L (ref 1.15–1.40)
HCT: 20 % — ABNORMAL LOW (ref 39.0–52.0)
HCT: 20 % — ABNORMAL LOW (ref 39.0–52.0)
HCT: 21 % — ABNORMAL LOW (ref 39.0–52.0)
HCT: 21 % — ABNORMAL LOW (ref 39.0–52.0)
HCT: 21 % — ABNORMAL LOW (ref 39.0–52.0)
HCT: 22 % — ABNORMAL LOW (ref 39.0–52.0)
HCT: 22 % — ABNORMAL LOW (ref 39.0–52.0)
HCT: 22 % — ABNORMAL LOW (ref 39.0–52.0)
HCT: 22 % — ABNORMAL LOW (ref 39.0–52.0)
HCT: 27 % — ABNORMAL LOW (ref 39.0–52.0)
HCT: 27 % — ABNORMAL LOW (ref 39.0–52.0)
HCT: 29 % — ABNORMAL LOW (ref 39.0–52.0)
Hemoglobin: 6.8 g/dL — CL (ref 13.0–17.0)
Hemoglobin: 6.8 g/dL — CL (ref 13.0–17.0)
Hemoglobin: 7.1 g/dL — ABNORMAL LOW (ref 13.0–17.0)
Hemoglobin: 7.1 g/dL — ABNORMAL LOW (ref 13.0–17.0)
Hemoglobin: 7.1 g/dL — ABNORMAL LOW (ref 13.0–17.0)
Hemoglobin: 7.5 g/dL — ABNORMAL LOW (ref 13.0–17.0)
Hemoglobin: 7.5 g/dL — ABNORMAL LOW (ref 13.0–17.0)
Hemoglobin: 7.5 g/dL — ABNORMAL LOW (ref 13.0–17.0)
Hemoglobin: 7.5 g/dL — ABNORMAL LOW (ref 13.0–17.0)
Hemoglobin: 9.2 g/dL — ABNORMAL LOW (ref 13.0–17.0)
Hemoglobin: 9.2 g/dL — ABNORMAL LOW (ref 13.0–17.0)
Hemoglobin: 9.9 g/dL — ABNORMAL LOW (ref 13.0–17.0)
O2 Saturation: 86 %
O2 Saturation: 86 %
O2 Saturation: 87 %
O2 Saturation: 89 %
O2 Saturation: 92 %
O2 Saturation: 93 %
O2 Saturation: 94 %
O2 Saturation: 95 %
O2 Saturation: 96 %
O2 Saturation: 97 %
O2 Saturation: 98 %
O2 Saturation: 98 %
Patient temperature: 36.6
Patient temperature: 36.6
Patient temperature: 36.7
Patient temperature: 36.9
Patient temperature: 36.9
Patient temperature: 37
Patient temperature: 37
Patient temperature: 37.1
Patient temperature: 37.2
Patient temperature: 37.2
Patient temperature: 37.2
Patient temperature: 37.3
Potassium: 3.4 mmol/L — ABNORMAL LOW (ref 3.5–5.1)
Potassium: 3.5 mmol/L (ref 3.5–5.1)
Potassium: 3.6 mmol/L (ref 3.5–5.1)
Potassium: 3.7 mmol/L (ref 3.5–5.1)
Potassium: 3.8 mmol/L (ref 3.5–5.1)
Potassium: 3.8 mmol/L (ref 3.5–5.1)
Potassium: 3.9 mmol/L (ref 3.5–5.1)
Potassium: 4 mmol/L (ref 3.5–5.1)
Potassium: 4 mmol/L (ref 3.5–5.1)
Potassium: 4.1 mmol/L (ref 3.5–5.1)
Potassium: 4.4 mmol/L (ref 3.5–5.1)
Potassium: 4.5 mmol/L (ref 3.5–5.1)
Sodium: 157 mmol/L — ABNORMAL HIGH (ref 135–145)
Sodium: 157 mmol/L — ABNORMAL HIGH (ref 135–145)
Sodium: 157 mmol/L — ABNORMAL HIGH (ref 135–145)
Sodium: 157 mmol/L — ABNORMAL HIGH (ref 135–145)
Sodium: 157 mmol/L — ABNORMAL HIGH (ref 135–145)
Sodium: 157 mmol/L — ABNORMAL HIGH (ref 135–145)
Sodium: 158 mmol/L — ABNORMAL HIGH (ref 135–145)
Sodium: 158 mmol/L — ABNORMAL HIGH (ref 135–145)
Sodium: 158 mmol/L — ABNORMAL HIGH (ref 135–145)
Sodium: 158 mmol/L — ABNORMAL HIGH (ref 135–145)
Sodium: 158 mmol/L — ABNORMAL HIGH (ref 135–145)
Sodium: 159 mmol/L — ABNORMAL HIGH (ref 135–145)
TCO2: 23 mmol/L (ref 22–32)
TCO2: 23 mmol/L (ref 22–32)
TCO2: 23 mmol/L (ref 22–32)
TCO2: 23 mmol/L (ref 22–32)
TCO2: 23 mmol/L (ref 22–32)
TCO2: 24 mmol/L (ref 22–32)
TCO2: 24 mmol/L (ref 22–32)
TCO2: 25 mmol/L (ref 22–32)
TCO2: 25 mmol/L (ref 22–32)
TCO2: 25 mmol/L (ref 22–32)
TCO2: 26 mmol/L (ref 22–32)
TCO2: 27 mmol/L (ref 22–32)
pCO2 arterial: 31.3 mmHg — ABNORMAL LOW (ref 32–48)
pCO2 arterial: 31.9 mmHg — ABNORMAL LOW (ref 32–48)
pCO2 arterial: 33.5 mmHg (ref 32–48)
pCO2 arterial: 36.5 mmHg (ref 32–48)
pCO2 arterial: 40.5 mmHg (ref 32–48)
pCO2 arterial: 42.3 mmHg (ref 32–48)
pCO2 arterial: 42.4 mmHg (ref 32–48)
pCO2 arterial: 43.4 mmHg (ref 32–48)
pCO2 arterial: 45.6 mmHg (ref 32–48)
pCO2 arterial: 45.9 mmHg (ref 32–48)
pCO2 arterial: 46.9 mmHg (ref 32–48)
pCO2 arterial: 47.7 mmHg (ref 32–48)
pH, Arterial: 7.298 — ABNORMAL LOW (ref 7.35–7.45)
pH, Arterial: 7.312 — ABNORMAL LOW (ref 7.35–7.45)
pH, Arterial: 7.316 — ABNORMAL LOW (ref 7.35–7.45)
pH, Arterial: 7.324 — ABNORMAL LOW (ref 7.35–7.45)
pH, Arterial: 7.353 (ref 7.35–7.45)
pH, Arterial: 7.357 (ref 7.35–7.45)
pH, Arterial: 7.363 (ref 7.35–7.45)
pH, Arterial: 7.367 (ref 7.35–7.45)
pH, Arterial: 7.386 (ref 7.35–7.45)
pH, Arterial: 7.426 (ref 7.35–7.45)
pH, Arterial: 7.453 — ABNORMAL HIGH (ref 7.35–7.45)
pH, Arterial: 7.46 — ABNORMAL HIGH (ref 7.35–7.45)
pO2, Arterial: 103 mmHg (ref 83–108)
pO2, Arterial: 54 mmHg — ABNORMAL LOW (ref 83–108)
pO2, Arterial: 57 mmHg — ABNORMAL LOW (ref 83–108)
pO2, Arterial: 58 mmHg — ABNORMAL LOW (ref 83–108)
pO2, Arterial: 63 mmHg — ABNORMAL LOW (ref 83–108)
pO2, Arterial: 64 mmHg — ABNORMAL LOW (ref 83–108)
pO2, Arterial: 71 mmHg — ABNORMAL LOW (ref 83–108)
pO2, Arterial: 75 mmHg — ABNORMAL LOW (ref 83–108)
pO2, Arterial: 82 mmHg — ABNORMAL LOW (ref 83–108)
pO2, Arterial: 86 mmHg (ref 83–108)
pO2, Arterial: 88 mmHg (ref 83–108)
pO2, Arterial: 95 mmHg (ref 83–108)

## 2021-08-11 LAB — HEPATIC FUNCTION PANEL
ALT: 66 U/L — ABNORMAL HIGH (ref 0–44)
AST: 79 U/L — ABNORMAL HIGH (ref 15–41)
Albumin: 2.7 g/dL — ABNORMAL LOW (ref 3.5–5.0)
Alkaline Phosphatase: 120 U/L (ref 38–126)
Bilirubin, Direct: 0.6 mg/dL — ABNORMAL HIGH (ref 0.0–0.2)
Indirect Bilirubin: 1 mg/dL — ABNORMAL HIGH (ref 0.3–0.9)
Total Bilirubin: 1.6 mg/dL — ABNORMAL HIGH (ref 0.3–1.2)
Total Protein: 4.8 g/dL — ABNORMAL LOW (ref 6.5–8.1)

## 2021-08-11 LAB — BASIC METABOLIC PANEL
Anion gap: 6 (ref 5–15)
Anion gap: 3 — ABNORMAL LOW (ref 5–15)
BUN: 26 mg/dL — ABNORMAL HIGH (ref 6–20)
BUN: 27 mg/dL — ABNORMAL HIGH (ref 6–20)
CO2: 23 mmol/L (ref 22–32)
CO2: 23 mmol/L (ref 22–32)
Calcium: 8.1 mg/dL — ABNORMAL LOW (ref 8.9–10.3)
Calcium: 8.1 mg/dL — ABNORMAL LOW (ref 8.9–10.3)
Chloride: 125 mmol/L — ABNORMAL HIGH (ref 98–111)
Chloride: 127 mmol/L — ABNORMAL HIGH (ref 98–111)
Creatinine, Ser: 1.05 mg/dL (ref 0.61–1.24)
Creatinine, Ser: 0.94 mg/dL (ref 0.61–1.24)
GFR, Estimated: >60 mL/min (ref >60)
GFR, Estimated: >60 mL/min (ref >60)
Glucose, Bld: 123 mg/dL — ABNORMAL HIGH (ref 70–99)
Glucose, Bld: 122 mg/dL — ABNORMAL HIGH (ref 70–99)
Potassium: 3.9 mmol/L (ref 3.5–5.1)
Potassium: 3.6 mmol/L (ref 3.5–5.1)
Sodium: 154 mmol/L — ABNORMAL HIGH (ref 135–145)
Sodium: 153 mmol/L — ABNORMAL HIGH (ref 135–145)

## 2021-08-11 LAB — HEPARIN LEVEL (UNFRACTIONATED)
Heparin Unfractionated: <0.1 IU/mL — ABNORMAL LOW (ref 0.30–0.70)
Heparin Unfractionated: <0.1 IU/mL — ABNORMAL LOW (ref 0.30–0.70)

## 2021-08-11 LAB — CBC
HCT: 26.9 % — ABNORMAL LOW (ref 39.0–52.0)
HCT: 24.4 % — ABNORMAL LOW (ref 39.0–52.0)
Hemoglobin: 8.7 g/dL — ABNORMAL LOW (ref 13.0–17.0)
Hemoglobin: 8 g/dL — ABNORMAL LOW (ref 13.0–17.0)
MCH: 28.9 pg (ref 26.0–34.0)
MCH: 29.2 pg (ref 26.0–34.0)
MCHC: 32.3 g/dL (ref 30.0–36.0)
MCHC: 32.8 g/dL (ref 30.0–36.0)
MCV: 89.4 fL (ref 80.0–100.0)
MCV: 89.1 fL (ref 80.0–100.0)
Platelets: 125 10*3/uL — ABNORMAL LOW (ref 150–400)
Platelets: 126 10*3/uL — ABNORMAL LOW (ref 150–400)
RBC: 3.01 MIL/uL — ABNORMAL LOW (ref 4.22–5.81)
RBC: 2.74 MIL/uL — ABNORMAL LOW (ref 4.22–5.81)
RDW: 18.3 % — ABNORMAL HIGH (ref 11.5–15.5)
RDW: 18.5 % — ABNORMAL HIGH (ref 11.5–15.5)
WBC: 11 10*3/uL — ABNORMAL HIGH (ref 4.0–10.5)
WBC: 11.1 10*3/uL — ABNORMAL HIGH (ref 4.0–10.5)
nRBC: 2.6 % — ABNORMAL HIGH (ref 0.0–0.2)
nRBC: 3.9 % — ABNORMAL HIGH (ref 0.0–0.2)

## 2021-08-11 LAB — PROTIME-INR
INR: 1.3 — ABNORMAL HIGH (ref 0.8–1.2)
Prothrombin Time: 16.4 seconds — ABNORMAL HIGH (ref 11.4–15.2)

## 2021-08-11 LAB — LACTIC ACID, PLASMA
Lactic Acid, Venous: 0.8 mmol/L (ref 0.5–1.9)
Lactic Acid, Venous: 1.1 mmol/L (ref 0.5–1.9)

## 2021-08-11 LAB — GLUCOSE, CAPILLARY
Glucose-Capillary: 110 mg/dL — ABNORMAL HIGH (ref 70–99)
Glucose-Capillary: 110 mg/dL — ABNORMAL HIGH (ref 70–99)
Glucose-Capillary: 131 mg/dL — ABNORMAL HIGH (ref 70–99)
Glucose-Capillary: 119 mg/dL — ABNORMAL HIGH (ref 70–99)
Glucose-Capillary: 122 mg/dL — ABNORMAL HIGH (ref 70–99)

## 2021-08-11 LAB — APTT
aPTT: 58 seconds — ABNORMAL HIGH (ref 24–36)
aPTT: 62 seconds — ABNORMAL HIGH (ref 24–36)

## 2021-08-11 LAB — MAGNESIUM
Magnesium: 2.1 mg/dL (ref 1.7–2.4)
Magnesium: 2.1 mg/dL (ref 1.7–2.4)

## 2021-08-11 LAB — FIBRINOGEN: Fibrinogen: 492 mg/dL — ABNORMAL HIGH (ref 210–475)

## 2021-08-11 LAB — PHOSPHORUS
Phosphorus: 3.4 mg/dL (ref 2.5–4.6)
Phosphorus: 2.3 mg/dL — ABNORMAL LOW (ref 2.5–4.6)

## 2021-08-11 LAB — LACTATE DEHYDROGENASE: LDH: 462 U/L — ABNORMAL HIGH (ref 98–192)

## 2021-08-11 MED ORDER — MIDAZOLAM-SODIUM CHLORIDE 100-0.9 MG/100ML-% IV SOLN: 0.5000 mg/h | INTRAVENOUS | Status: DC

## 2021-08-11 MED ORDER — ARTIFICIAL TEARS OPHTHALMIC OINT
TOPICAL_OINTMENT | OPHTHALMIC | Status: DC | PRN
  Filled 2021-08-11 (×2): qty 3.5

## 2021-08-11 MED ORDER — FUROSEMIDE 10 MG/ML IJ SOLN: 20.0000 mg | Freq: Once | INTRAMUSCULAR | Status: DC

## 2021-08-11 MED ORDER — MIDAZOLAM BOLUS VIA INFUSION
0.0000 mg | INTRAVENOUS | Status: DC | PRN
  Administered 2021-08-12: 2 mg via INTRAVENOUS
  Administered 2021-08-14: 4 mg via INTRAVENOUS
  Administered 2021-08-14: 2 mg via INTRAVENOUS
  Administered 2021-08-14: 4 mg via INTRAVENOUS
  Administered 2021-08-15 – 2021-08-16 (×3): 2 mg via INTRAVENOUS

## 2021-08-11 MED ORDER — METOPROLOL TARTRATE 50 MG PO TABS
50.0000 mg | ORAL_TABLET | Freq: Three times a day (TID) | ORAL | Status: DC
  Administered 2021-08-11 – 2021-08-17 (×14): 50 mg
  Filled 2021-08-11 (×15): qty 1

## 2021-08-11 MED ORDER — DEXMEDETOMIDINE HCL IN NACL 400 MCG/100ML IV SOLN
0.0000 ug/kg/h | INTRAVENOUS | Status: AC
  Administered 2021-08-11 – 2021-08-12 (×2): 0.7 ug/kg/h via INTRAVENOUS
  Administered 2021-08-12: 0.4 ug/kg/h via INTRAVENOUS
  Administered 2021-08-13: 0.6 ug/kg/h via INTRAVENOUS
  Administered 2021-08-13: 0.4 ug/kg/h via INTRAVENOUS
  Administered 2021-08-14 (×2): 0.5 ug/kg/h via INTRAVENOUS
  Filled 2021-08-11 (×7): qty 100

## 2021-08-11 MED ORDER — PROPOFOL 1000 MG/100ML IV EMUL
INTRAVENOUS | Status: AC
  Filled 2021-08-11: qty 100

## 2021-08-11 MED ORDER — POTASSIUM CHLORIDE 20 MEQ PO PACK
40.0000 meq | PACK | Freq: Once | ORAL | Status: AC
Start: 2021-08-11 — End: 2021-08-11
  Administered 2021-08-11: 40 meq
  Filled 2021-08-11: qty 2

## 2021-08-11 MED ORDER — HYDRALAZINE HCL 50 MG PO TABS
50.0000 mg | ORAL_TABLET | Freq: Three times a day (TID) | ORAL | Status: DC
  Administered 2021-08-11 – 2021-08-17 (×12): 50 mg
  Filled 2021-08-11 (×15): qty 1

## 2021-08-11 MED ORDER — POTASSIUM CHLORIDE 20 MEQ PO PACK
40.0000 meq | PACK | Freq: Once | ORAL | Status: AC
  Administered 2021-08-11: 40 meq
  Filled 2021-08-11: qty 2

## 2021-08-11 MED ORDER — MIDAZOLAM-SODIUM CHLORIDE 100-0.9 MG/100ML-% IV SOLN
0.0000 mg/h | INTRAVENOUS | Status: DC
  Administered 2021-08-12 (×2): 10 mg/h via INTRAVENOUS
  Administered 2021-08-12: 2 mg/h via INTRAVENOUS
  Administered 2021-08-13: 10 mg/h via INTRAVENOUS
  Administered 2021-08-13: 8 mg/h via INTRAVENOUS
  Administered 2021-08-14: 5 mg/h via INTRAVENOUS
  Administered 2021-08-15: 3 mg/h via INTRAVENOUS
  Administered 2021-08-15: 5 mg/h via INTRAVENOUS
  Administered 2021-08-16: 8 mg/h via INTRAVENOUS
  Administered 2021-08-17: 6 mg/h via INTRAVENOUS
  Filled 2021-08-11 (×10): qty 100

## 2021-08-11 MED ORDER — ACETAMINOPHEN 160 MG/5ML PO SOLN
1000.0000 mg | Freq: Four times a day (QID) | ORAL | Status: DC
Start: 2021-08-11 — End: 2021-08-23
  Administered 2021-08-11 – 2021-08-23 (×45): 1000 mg
  Filled 2021-08-11 (×42): qty 40.6

## 2021-08-11 MED ORDER — PROPOFOL 1000 MG/100ML IV EMUL
5.0000 ug/kg/min | INTRAVENOUS | Status: DC
  Administered 2021-08-11: 10 ug/kg/min via INTRAVENOUS
  Administered 2021-08-11: 35 ug/kg/min via INTRAVENOUS
  Administered 2021-08-12: 60 ug/kg/min via INTRAVENOUS
  Administered 2021-08-12: 20 ug/kg/min via INTRAVENOUS
  Administered 2021-08-12: 100 ug/kg/min via INTRAVENOUS
  Administered 2021-08-12 (×2): 60 ug/kg/min via INTRAVENOUS
  Administered 2021-08-13: 10 ug/kg/min via INTRAVENOUS
  Filled 2021-08-11 (×3): qty 100
  Filled 2021-08-11: qty 200
  Filled 2021-08-11 (×2): qty 100

## 2021-08-11 MED ORDER — FUROSEMIDE 10 MG/ML IJ SOLN
20.0000 mg | Freq: Once | INTRAMUSCULAR | Status: AC
  Administered 2021-08-11: 20 mg via INTRAVENOUS
  Filled 2021-08-11: qty 2
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
## 2021-08-11 NOTE — Progress Notes (Signed)
Orthopedic Tech Progress Note Patient Details:  Jeff [name] 08/16/2000 [id number]  RN called requesting to have traction bags switched out, patient soiled himself. RN cut off weights, I came and wiped bags down in bleach wipes then reapplied sandbags. Family at bedside    Patient ID: Jeff [name], male   DOB: Oct 30, 2000, 21 y.o.   MRN: [id number]  [name] 08/11/2021, 3:56 PM
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
## 2021-08-11 NOTE — Plan of Care (Signed)

## 2021-08-11 NOTE — Progress Notes (Addendum)
Trauma/Critical Care Follow Up Note  Subjective:    Overnight Issues:   Objective:  Vital signs for last 24 hours: Temp:  [96.8 F (36 C)-99.1 F (37.3 C)] 99 F (37.2 C) (07/17 0600) Pulse Rate:  [90-125] 120 (07/17 0804) Resp:  [0-21] 18 (07/17 0804) BP: (113-143)/(53-78) 140/74 (07/16 2334) SpO2:  [81 %-99 %] 99 % (07/17 0804) Arterial Line BP: (101-180)/(50-83) 165/75 (07/17 0600) FiO2 (%):  [40 %-80 %] 80 % (07/17 0804) Weight:  [93 kg] 93 kg (07/17 0500)  Hemodynamic parameters for last 24 hours:    Intake/Output from previous day: 07/16 0701 - 07/17 0700 In: 5136.7 [I.V.:876.5; Blood:304; NG/GT:2236.2; IV Piggyback:1720.1] Out: 3990 [Urine:2790; Emesis/NG output:800; Drains:130; Chest Tube:270]  Intake/Output this shift: Total I/O In: -  Out: 380 [Urine:150; Emesis/NG output:100; Drains:10; Chest Tube:120]  Vent settings for last 24 hours: Vent Mode: PCV FiO2 (%):  [40 %-80 %] 80 % Set Rate:  [15 bmp] 15 bmp PEEP:  [12 cmH20-14 cmH20] 14 cmH20 Plateau Pressure:  [22 cmH20-25 cmH20] 24 cmH20  Physical Exam:  Gen: comfortable, no distress Neuro: f/c off sedation HEENT: PERRL Neck: supple CV: RRR Pulm: unlabored breathing Abd: soft, NT GU: clear yellow urine Extr: wwp, trace edema   Results for orders placed or performed during the hospital encounter of 08/05/21 (from the past 24 hour(s))  Glucose, capillary     Status: Abnormal   Collection Time: 08/10/21 10:14 AM  Result Value Ref Range   Glucose-Capillary 100 (H) 70 - 99 mg/dL  I-STAT 7, (LYTES, BLD GAS, ICA, H+H)     Status: Abnormal   Collection Time: 08/10/21 10:15 AM  Result Value Ref Range   pH, Arterial 7.305 (L) 7.35 - 7.45   pCO2 arterial 46.3 32 - 48 mmHg   pO2, Arterial 77 (L) 83 - 108 mmHg   Bicarbonate 23.2 20.0 - 28.0 mmol/L   TCO2 25 22 - 32 mmol/L   O2 Saturation 94 %   Acid-base deficit 3.0 (H) 0.0 - 2.0 mmol/L   Sodium 158 (H) 135 - 145 mmol/L   Potassium 3.7 3.5 - 5.1  mmol/L   Calcium, Ion 1.24 1.15 - 1.40 mmol/L   HCT 26.0 (L) 39.0 - 52.0 %   Hemoglobin 8.8 (L) 13.0 - 17.0 g/dL   Patient temperature 45.4 C    Collection site RADIAL, ALLEN'S TEST ACCEPTABLE    Drawn by HIDE    Sample type ARTERIAL   Glucose, capillary     Status: Abnormal   Collection Time: 08/10/21 12:21 PM  Result Value Ref Range   Glucose-Capillary 117 (H) 70 - 99 mg/dL  I-STAT 7, (LYTES, BLD GAS, ICA, H+H)     Status: Abnormal   Collection Time: 08/10/21 12:24 PM  Result Value Ref Range   pH, Arterial 7.333 (L) 7.35 - 7.45   pCO2 arterial 42.7 32 - 48 mmHg   pO2, Arterial 57 (L) 83 - 108 mmHg   Bicarbonate 22.8 20.0 - 28.0 mmol/L   TCO2 24 22 - 32 mmol/L   O2 Saturation 88 %   Acid-base deficit 3.0 (H) 0.0 - 2.0 mmol/L   Sodium 157 (H) 135 - 145 mmol/L   Potassium 3.7 3.5 - 5.1 mmol/L   Calcium, Ion 1.23 1.15 - 1.40 mmol/L   HCT 23.0 (L) 39.0 - 52.0 %   Hemoglobin 7.8 (L) 13.0 - 17.0 g/dL   Patient temperature 09.8 C    Collection site RADIAL, ALLEN'S TEST ACCEPTABLE    Drawn  by HIDE    Sample type ARTERIAL   I-STAT 7, (LYTES, BLD GAS, ICA, H+H)     Status: Abnormal   Collection Time: 08/10/21  1:25 PM  Result Value Ref Range   pH, Arterial 7.316 (L) 7.35 - 7.45   pCO2 arterial 45.8 32 - 48 mmHg   pO2, Arterial 58 (L) 83 - 108 mmHg   Bicarbonate 23.4 20.0 - 28.0 mmol/L   TCO2 25 22 - 32 mmol/L   O2 Saturation 87 %   Acid-base deficit 3.0 (H) 0.0 - 2.0 mmol/L   Sodium 157 (H) 135 - 145 mmol/L   Potassium 3.6 3.5 - 5.1 mmol/L   Calcium, Ion 1.27 1.15 - 1.40 mmol/L   HCT 25.0 (L) 39.0 - 52.0 %   Hemoglobin 8.5 (L) 13.0 - 17.0 g/dL   Patient temperature 16.1 C    Collection site RADIAL, ALLEN'S TEST ACCEPTABLE    Drawn by HIDE    Sample type ARTERIAL   Glucose, capillary     Status: Abnormal   Collection Time: 08/10/21  3:36 PM  Result Value Ref Range   Glucose-Capillary 100 (H) 70 - 99 mg/dL  I-STAT 7, (LYTES, BLD GAS, ICA, H+H)     Status: Abnormal    Collection Time: 08/10/21  3:39 PM  Result Value Ref Range   pH, Arterial 7.279 (L) 7.35 - 7.45   pCO2 arterial 49.0 (H) 32 - 48 mmHg   pO2, Arterial 49 (L) 83 - 108 mmHg   Bicarbonate 23.0 20.0 - 28.0 mmol/L   TCO2 24 22 - 32 mmol/L   O2 Saturation 79 %   Acid-base deficit 4.0 (H) 0.0 - 2.0 mmol/L   Sodium 158 (H) 135 - 145 mmol/L   Potassium 3.6 3.5 - 5.1 mmol/L   Calcium, Ion 1.26 1.15 - 1.40 mmol/L   HCT 20.0 (L) 39.0 - 52.0 %   Hemoglobin 6.8 (LL) 13.0 - 17.0 g/dL   Patient temperature 09.6 C    Sample type ARTERIAL    Comment NOTIFIED PHYSICIAN   CBC     Status: Abnormal   Collection Time: 08/10/21  4:11 PM  Result Value Ref Range   WBC 9.1 4.0 - 10.5 K/uL   RBC 2.56 (L) 4.22 - 5.81 MIL/uL   Hemoglobin 7.5 (L) 13.0 - 17.0 g/dL   HCT 04.5 (L) 40.9 - 81.1 %   MCV 90.6 80.0 - 100.0 fL   MCH 29.3 26.0 - 34.0 pg   MCHC 32.3 30.0 - 36.0 g/dL   RDW 91.4 (H) 78.2 - 95.6 %   Platelets 109 (L) 150 - 400 K/uL   nRBC 2.2 (H) 0.0 - 0.2 %  Basic metabolic panel     Status: Abnormal   Collection Time: 08/10/21  4:11 PM  Result Value Ref Range   Sodium 154 (H) 135 - 145 mmol/L   Potassium 3.8 3.5 - 5.1 mmol/L   Chloride 125 (H) 98 - 111 mmol/L   CO2 24 22 - 32 mmol/L   Glucose, Bld 106 (H) 70 - 99 mg/dL   BUN 30 (H) 6 - 20 mg/dL   Creatinine, Ser 2.13 0.61 - 1.24 mg/dL   Calcium 7.7 (L) 8.9 - 10.3 mg/dL   GFR, Estimated >08 >65 mL/min   Anion gap 5 5 - 15  Heparin level (unfractionated)     Status: Abnormal   Collection Time: 08/10/21  4:11 PM  Result Value Ref Range   Heparin Unfractionated <0.10 (L) 0.30 - 0.70 IU/mL  APTT     Status: Abnormal   Collection Time: 08/10/21  4:11 PM  Result Value Ref Range   aPTT 65 (H) 24 - 36 seconds  Lactic acid, plasma     Status: None   Collection Time: 08/10/21  4:17 PM  Result Value Ref Range   Lactic Acid, Venous 0.8 0.5 - 1.9 mmol/L  I-STAT 7, (LYTES, BLD GAS, ICA, H+H)     Status: Abnormal   Collection Time: 08/10/21  4:40 PM   Result Value Ref Range   pH, Arterial 7.309 (L) 7.35 - 7.45   pCO2 arterial 46.9 32 - 48 mmHg   pO2, Arterial 53 (L) 83 - 108 mmHg   Bicarbonate 23.6 20.0 - 28.0 mmol/L   TCO2 25 22 - 32 mmol/L   O2 Saturation 84 %   Acid-base deficit 3.0 (H) 0.0 - 2.0 mmol/L   Sodium 158 (H) 135 - 145 mmol/L   Potassium 3.9 3.5 - 5.1 mmol/L   Calcium, Ion 1.24 1.15 - 1.40 mmol/L   HCT 24.0 (L) 39.0 - 52.0 %   Hemoglobin 8.2 (L) 13.0 - 17.0 g/dL   Patient temperature 40.936.8 C    Collection site RADIAL, ALLEN'S TEST ACCEPTABLE    Drawn by HIDE    Sample type ARTERIAL   POCT I-Stat EG7     Status: Abnormal   Collection Time: 08/10/21  4:57 PM  Result Value Ref Range   pH, Ven 7.312 7.25 - 7.43   pCO2, Ven 48.3 44 - 60 mmHg   pO2, Ven 33 32 - 45 mmHg   Bicarbonate 24.5 20.0 - 28.0 mmol/L   TCO2 26 22 - 32 mmol/L   O2 Saturation 57 %   Acid-base deficit 2.0 0.0 - 2.0 mmol/L   Sodium 158 (H) 135 - 145 mmol/L   Potassium 3.8 3.5 - 5.1 mmol/L   Calcium, Ion 1.25 1.15 - 1.40 mmol/L   HCT 20.0 (L) 39.0 - 52.0 %   Hemoglobin 6.8 (LL) 13.0 - 17.0 g/dL   Patient temperature 81.136.9 C    Sample type VENOUS    Comment NOTIFIED PHYSICIAN   I-STAT 7, (LYTES, BLD GAS, ICA, H+H)     Status: Abnormal   Collection Time: 08/10/21  5:04 PM  Result Value Ref Range   pH, Arterial 7.471 (H) 7.35 - 7.45   pCO2 arterial 34.4 32 - 48 mmHg   pO2, Arterial 539 (H) 83 - 108 mmHg   Bicarbonate 25.1 20.0 - 28.0 mmol/L   TCO2 26 22 - 32 mmol/L   O2 Saturation 100 %   Acid-Base Excess 1.0 0.0 - 2.0 mmol/L   Sodium 158 (H) 135 - 145 mmol/L   Potassium 3.8 3.5 - 5.1 mmol/L   Calcium, Ion 1.18 1.15 - 1.40 mmol/L   HCT 19.0 (L) 39.0 - 52.0 %   Hemoglobin 6.5 (LL) 13.0 - 17.0 g/dL   Patient temperature 91.436.9 C    Sample type ECMO Circuit    Comment NOTIFIED PHYSICIAN   I-STAT 7, (LYTES, BLD GAS, ICA, H+H)     Status: Abnormal   Collection Time: 08/10/21  6:05 PM  Result Value Ref Range   pH, Arterial 7.289 (L) 7.35 -  7.45   pCO2 arterial 47.5 32 - 48 mmHg   pO2, Arterial 50 (L) 83 - 108 mmHg   Bicarbonate 22.8 20.0 - 28.0 mmol/L   TCO2 24 22 - 32 mmol/L   O2 Saturation 80 %   Acid-base deficit 4.0 (H) 0.0 -  2.0 mmol/L   Sodium 158 (H) 135 - 145 mmol/L   Potassium 3.8 3.5 - 5.1 mmol/L   Calcium, Ion 1.27 1.15 - 1.40 mmol/L   HCT 23.0 (L) 39.0 - 52.0 %   Hemoglobin 7.8 (L) 13.0 - 17.0 g/dL   Patient temperature 02.7 C    Collection site RADIAL, ALLEN'S TEST ACCEPTABLE    Drawn by HIDE    Sample type ARTERIAL   I-STAT 7, (LYTES, BLD GAS, ICA, H+H)     Status: Abnormal   Collection Time: 08/10/21  6:51 PM  Result Value Ref Range   pH, Arterial 7.347 (L) 7.35 - 7.45   pCO2 arterial 42.5 32 - 48 mmHg   pO2, Arterial 60 (L) 83 - 108 mmHg   Bicarbonate 23.3 20.0 - 28.0 mmol/L   TCO2 25 22 - 32 mmol/L   O2 Saturation 89 %   Acid-base deficit 2.0 0.0 - 2.0 mmol/L   Sodium 158 (H) 135 - 145 mmol/L   Potassium 3.7 3.5 - 5.1 mmol/L   Calcium, Ion 1.25 1.15 - 1.40 mmol/L   HCT 22.0 (L) 39.0 - 52.0 %   Hemoglobin 7.5 (L) 13.0 - 17.0 g/dL   Patient temperature 74.1 C    Collection site RADIAL, ALLEN'S TEST ACCEPTABLE    Drawn by HIDE    Sample type ARTERIAL   Prepare RBC (crossmatch)     Status: None   Collection Time: 08/10/21  7:30 PM  Result Value Ref Range   Order Confirmation      ORDER PROCESSED BY BLOOD BANK Performed at [facility], [address], [place], [place] [address]   Glucose, capillary     Status: Abnormal   Collection Time: 08/10/21  7:45 PM  Result Value Ref Range   Glucose-Capillary 109 (H) 70 - 99 mg/dL  Phosphorus     Status: None   Collection Time: 08/10/21 10:00 PM  Result Value Ref Range   Phosphorus 3.4 2.5 - 4.6 mg/dL  Magnesium     Status: None   Collection Time: 08/10/21 10:00 PM  Result Value Ref Range   Magnesium 2.1 1.7 - 2.4 mg/dL  I-STAT 7, (LYTES, BLD GAS, ICA, H+H)     Status: Abnormal   Collection Time: 08/10/21 10:15 PM  Result Value Ref Range    pH, Arterial 7.310 (L) 7.35 - 7.45   pCO2 arterial 44.7 32 - 48 mmHg   pO2, Arterial 52 (L) 83 - 108 mmHg   Bicarbonate 22.5 20.0 - 28.0 mmol/L   TCO2 24 22 - 32 mmol/L   O2 Saturation 83 %   Acid-base deficit 4.0 (H) 0.0 - 2.0 mmol/L   Sodium 158 (H) 135 - 145 mmol/L   Potassium 3.4 (L) 3.5 - 5.1 mmol/L   Calcium, Ion 1.25 1.15 - 1.40 mmol/L   HCT 22.0 (L) 39.0 - 52.0 %   Hemoglobin 7.5 (L) 13.0 - 17.0 g/dL   Patient temperature 76.7 C    Collection site art line    Drawn by Operator    Sample type ARTERIAL   CBC with Differential/Platelet     Status: Abnormal   Collection Time: 08/10/21 10:41 PM  Result Value Ref Range   WBC 9.4 4.0 - 10.5 K/uL   RBC 2.80 (L) 4.22 - 5.81 MIL/uL   Hemoglobin 8.1 (L) 13.0 - 17.0 g/dL   HCT 20.9 (L) 47.0 - 96.2 %   MCV 87.9 80.0 - 100.0 fL   MCH 28.9 26.0 - 34.0  pg   MCHC 32.9 30.0 - 36.0 g/dL   RDW 16.1 (H) 09.6 - 04.5 %   Platelets 109 (L) 150 - 400 K/uL   nRBC 2.8 (H) 0.0 - 0.2 %   Neutrophils Relative % 43 %   Neutro Abs 4.1 1.7 - 7.7 K/uL   Lymphocytes Relative 24 %   Lymphs Abs 2.3 0.7 - 4.0 K/uL   Monocytes Relative 10 %   Monocytes Absolute 0.9 0.1 - 1.0 K/uL   Eosinophils Relative 6 %   Eosinophils Absolute 0.6 (H) 0.0 - 0.5 K/uL   Basophils Relative 1 %   Basophils Absolute 0.1 0.0 - 0.1 K/uL   WBC Morphology MILD LEFT SHIFT (1-5% METAS, OCC MYELO, OCC BANDS)    RBC Morphology MORPHOLOGY UNREMARKABLE    Smear Review Normal platelet morphology    Immature Granulocytes 16 %   Abs Immature Granulocytes 1.48 (H) 0.00 - 0.07 K/uL  Comprehensive metabolic panel     Status: Abnormal   Collection Time: 08/10/21 10:41 PM  Result Value Ref Range   Sodium 151 (H) 135 - 145 mmol/L   Potassium 3.5 3.5 - 5.1 mmol/L   Chloride 125 (H) 98 - 111 mmol/L   CO2 23 22 - 32 mmol/L   Glucose, Bld 110 (H) 70 - 99 mg/dL   BUN 28 (H) 6 - 20 mg/dL   Creatinine, Ser 4.09 0.61 - 1.24 mg/dL   Calcium 7.6 (L) 8.9 - 10.3 mg/dL   Total Protein 4.6  (L) 6.5 - 8.1 g/dL   Albumin 2.7 (L) 3.5 - 5.0 g/dL   AST 75 (H) 15 - 41 U/L   ALT 63 (H) 0 - 44 U/L   Alkaline Phosphatase 107 38 - 126 U/L   Total Bilirubin 1.9 (H) 0.3 - 1.2 mg/dL   GFR, Estimated >81 >19 mL/min   Anion gap 3 (L) 5 - 15  Glucose, capillary     Status: None   Collection Time: 08/10/21 11:45 PM  Result Value Ref Range   Glucose-Capillary 99 70 - 99 mg/dL  I-STAT 7, (LYTES, BLD GAS, ICA, H+H)     Status: Abnormal   Collection Time: 08/11/21 12:02 AM  Result Value Ref Range   pH, Arterial 7.298 (L) 7.35 - 7.45   pCO2 arterial 46.9 32 - 48 mmHg   pO2, Arterial 57 (L) 83 - 108 mmHg   Bicarbonate 22.9 20.0 - 28.0 mmol/L   TCO2 24 22 - 32 mmol/L   O2 Saturation 86 %   Acid-base deficit 3.0 (H) 0.0 - 2.0 mmol/L   Sodium 159 (H) 135 - 145 mmol/L   Potassium 4.5 3.5 - 5.1 mmol/L   Calcium, Ion 1.28 1.15 - 1.40 mmol/L   HCT 22.0 (L) 39.0 - 52.0 %   Hemoglobin 7.5 (L) 13.0 - 17.0 g/dL   Patient temperature 14.7 C    Collection site art line    Drawn by Operator    Sample type ARTERIAL   I-STAT 7, (LYTES, BLD GAS, ICA, H+H)     Status: Abnormal   Collection Time: 08/11/21  1:22 AM  Result Value Ref Range   pH, Arterial 7.316 (L) 7.35 - 7.45   pCO2 arterial 47.7 32 - 48 mmHg   pO2, Arterial 63 (L) 83 - 108 mmHg   Bicarbonate 24.3 20.0 - 28.0 mmol/L   TCO2 26 22 - 32 mmol/L   O2 Saturation 89 %   Acid-base deficit 2.0 0.0 - 2.0 mmol/L   Sodium 158 (  H) 135 - 145 mmol/L   Potassium 4.0 3.5 - 5.1 mmol/L   Calcium, Ion 1.29 1.15 - 1.40 mmol/L   HCT 22.0 (L) 39.0 - 52.0 %   Hemoglobin 7.5 (L) 13.0 - 17.0 g/dL   Patient temperature 84.1 C    Collection site art line    Drawn by Operator    Sample type ARTERIAL   CBC     Status: Abnormal   Collection Time: 08/11/21  3:33 AM  Result Value Ref Range   WBC 11.0 (H) 4.0 - 10.5 K/uL   RBC 3.01 (L) 4.22 - 5.81 MIL/uL   Hemoglobin 8.7 (L) 13.0 - 17.0 g/dL   HCT 32.4 (L) 40.1 - 02.7 %   MCV 89.4 80.0 - 100.0 fL   MCH  28.9 26.0 - 34.0 pg   MCHC 32.3 30.0 - 36.0 g/dL   RDW 25.3 (H) 66.4 - 40.3 %   Platelets 125 (L) 150 - 400 K/uL   nRBC 2.6 (H) 0.0 - 0.2 %  Basic metabolic panel     Status: Abnormal   Collection Time: 08/11/21  3:33 AM  Result Value Ref Range   Sodium 154 (H) 135 - 145 mmol/L   Potassium 3.9 3.5 - 5.1 mmol/L   Chloride 125 (H) 98 - 111 mmol/L   CO2 23 22 - 32 mmol/L   Glucose, Bld 123 (H) 70 - 99 mg/dL   BUN 26 (H) 6 - 20 mg/dL   Creatinine, Ser 4.74 0.61 - 1.24 mg/dL   Calcium 8.1 (L) 8.9 - 10.3 mg/dL   GFR, Estimated >25 >95 mL/min   Anion gap 6 5 - 15  Lactate dehydrogenase     Status: Abnormal   Collection Time: 08/11/21  3:33 AM  Result Value Ref Range   LDH 462 (H) 98 - 192 U/L  Protime-INR     Status: Abnormal   Collection Time: 08/11/21  3:33 AM  Result Value Ref Range   Prothrombin Time 16.4 (H) 11.4 - 15.2 seconds   INR 1.3 (H) 0.8 - 1.2  Fibrinogen     Status: Abnormal   Collection Time: 08/11/21  3:33 AM  Result Value Ref Range   Fibrinogen 492 (H) 210 - 475 mg/dL  Hepatic function panel     Status: Abnormal   Collection Time: 08/11/21  3:33 AM  Result Value Ref Range   Total Protein 4.8 (L) 6.5 - 8.1 g/dL   Albumin 2.7 (L) 3.5 - 5.0 g/dL   AST 79 (H) 15 - 41 U/L   ALT 66 (H) 0 - 44 U/L   Alkaline Phosphatase 120 38 - 126 U/L   Total Bilirubin 1.6 (H) 0.3 - 1.2 mg/dL   Bilirubin, Direct 0.6 (H) 0.0 - 0.2 mg/dL   Indirect Bilirubin 1.0 (H) 0.3 - 0.9 mg/dL  APTT     Status: Abnormal   Collection Time: 08/11/21  3:33 AM  Result Value Ref Range   aPTT 58 (H) 24 - 36 seconds  Lactic acid, plasma     Status: None   Collection Time: 08/11/21  3:33 AM  Result Value Ref Range   Lactic Acid, Venous 0.8 0.5 - 1.9 mmol/L  Heparin level (unfractionated)     Status: Abnormal   Collection Time: 08/11/21  3:33 AM  Result Value Ref Range   Heparin Unfractionated <0.10 (L) 0.30 - 0.70 IU/mL  Glucose, capillary     Status: Abnormal   Collection Time: 08/11/21  3:36  AM  Result Value  Ref Range   Glucose-Capillary 110 (H) 70 - 99 mg/dL  I-STAT 7, (LYTES, BLD GAS, ICA, H+H)     Status: Abnormal   Collection Time: 08/11/21  3:38 AM  Result Value Ref Range   pH, Arterial 7.312 (L) 7.35 - 7.45   pCO2 arterial 45.9 32 - 48 mmHg   pO2, Arterial 58 (L) 83 - 108 mmHg   Bicarbonate 23.2 20.0 - 28.0 mmol/L   TCO2 25 22 - 32 mmol/L   O2 Saturation 87 %   Acid-base deficit 3.0 (H) 0.0 - 2.0 mmol/L   Sodium 158 (H) 135 - 145 mmol/L   Potassium 3.8 3.5 - 5.1 mmol/L   Calcium, Ion 1.29 1.15 - 1.40 mmol/L   HCT 22.0 (L) 39.0 - 52.0 %   Hemoglobin 7.5 (L) 13.0 - 17.0 g/dL   Patient temperature 02.4 C    Collection site art line    Drawn by Operator    Sample type ARTERIAL   Glucose, capillary     Status: Abnormal   Collection Time: 08/11/21  8:33 AM  Result Value Ref Range   Glucose-Capillary 110 (H) 70 - 99 mg/dL    Assessment & Plan: The plan of care was discussed with the entire team at ECMO rounds for the day, all are in agreement with this plan and no additional concerns were raised.   Present on Admission:  Trauma of chest  ARDS (adult respiratory distress syndrome) ([name])  Contusion of left lung  Acute on chronic respiratory failure with hypoxia and hypercapnia ([name])  Critical polytrauma  TBI (traumatic brain injury) ([name])    LOS: 6 days   Additional comments:I reviewed the patient's new clinical lab test results.   and I reviewed the patients new imaging test results.    62M [facility]   SDH, IVH, SAH - NSGY c/s, Dr. Maurice Small, repeat CT head 7/12 some IVH but fairly stable, okay for above normal sodiums and for heparin as long as no bolus dosing. Head CT 7/16 with edema, but improvement in IVH. No midline shift. L rib fx 1-8 with L HPTX - L CT on suction, keep at -40 Extensive pulmonary consolidation with refractory hypoxemia - cannulated for VV-ECMO 7/11, sweep up to 13, PEEP up to 14 Suspected aspiration - continue empiric  meropenem/vanc Fracture dislocation L elbow/olecranon and ulnar styloid with comminuted fracture of the left midshaft and distal humerus - initial eval by Dr. Linna Caprice, splinted, definitive care per Dr. Derinda Sis Trauma, OR 7/13 with Dr. Jena Gauss I&D, CR L humerus  Comminuted fracture dislocation R hip - reduced in TB, KI in place, traction pin 7/13 by Dr. Jena Gauss Complex lacerations RUE/RLE with degloving - operative washout and repair 7/13 by Dr. Jena Gauss, vac to RLE, proximity to R hip makes fixation high risk. TO OR for washout 7/18 with Dr. Carola Frost.  R comminuted first metacarpal fracture/open - irrigated and splinted by Dr. Linna Caprice, hand surgery notified by EDP Coagulopathy - platelets up to 125 ID - continue meropenem (d5) and vanc (d3), NGTD on resp cx from bronch 7/15 Acute blood loss anemia - Transfused PRBCs overnight. No signs of active bleeding from vac or chest tube. FEN - TF via cortrak, ok to advance to goal today DVT - SCDs, heparin gtt Dispo - ICU   To OR 7/18 for trach and vac change +/- fracture repair by ortho.   Critical Care Total Time: 40 minutes  Diamantina Monks, MD Trauma & General Surgery Please use [email or web address] to contact on  call provider  08/11/2021  *Care during the described time interval was provided by me. I have reviewed this patient's available data, including medical history, events of note, physical examination and test results as part of my evaluation.

## 2021-08-11 NOTE — Progress Notes (Signed)
Bilateral LE venous duplex study completed. Please see CV Proc for preliminary results.  Luian Schumpert BS, RVT 08/11/2021 12:16 PM

## 2021-08-11 NOTE — Progress Notes (Signed)
Spoke with patient's wife, Iline Oven Medina-Franco at [phone] this evening. Explained patient's clinical condition and indications for planned surgery 7/18. Patient and his wife are currently separated and the patient's wife has voluntary relinquished decision-making rights on behalf of the patient to his parents jointly. She verbalizes understanding that the relinquishment of decision-making rights will remain in place for the duration the patient's hospitalization. I have communicated to her that although she has relinquished decision-making, she is still entitled to clinical updates. This discussion was held using the assistance of interpreter services, [place], [id number].   I then called the patient's parents and notified them of the transfer of decision-making rights to them from the patient's wife. We discussed the patient's clinical condition and indications for planned surgery tomorrow. A detailed discussion was held regarding the risks and benefits of a tracheostomy, including, but not limited to bleeding, infection, injury to the airway, loss of the airway, hypoxia, anoxia. We discussed that risks of hypoxia/anoxia are reduced, but not eliminated by ECMO. Regarding risk of bleeding, patient's father verbalized that he did not want any further blood transfusions administered. I stated that the risks of blood loss from a tracheostomy that would necessitate blood transfusion are exceedingly low, but that the additional planned orthopedic procedures likely would. I informed him that if he did not want further blood transfusion, ECMO would likely need to be discontinued and without ECMO, I do not believe he would survive. He verbalized understanding and agrees to continued blood transfusions as indicated. I have placed a palliative care consult, as this discussion regarding blood transfusion was held with him prior to ECMO cannulation and I anticipate a significant amount more blood transfusions over  the course of the remainder of his hospitalization. All questions were answered from both parents and both provide consent for tracheostomy. This discussion was held using the assistance of interpreter services, [place], [id number].   Additional critical care time:  Diamantina Monks, MD General and Trauma Surgery [facility]

## 2021-08-12 ENCOUNTER — Encounter (HOSPITAL_COMMUNITY)
Admission: EM | Disposition: A | Discharge status: Other Acute Inpatient Hospital | Payer: Self-pay | Source: Home / Self Care

## 2021-08-12 ENCOUNTER — Inpatient Hospital Stay (HOSPITAL_COMMUNITY): Payer: Medicaid Other

## 2021-08-12 ENCOUNTER — Inpatient Hospital Stay (HOSPITAL_COMMUNITY): Payer: Medicaid Other | Admitting: Anesthesiology

## 2021-08-12 DIAGNOSIS — S069XAA Unspecified intracranial injury with loss of consciousness status unknown, initial encounter: Secondary | ICD-10-CM | POA: Diagnosis not present

## 2021-08-12 DIAGNOSIS — S52021A Displaced fracture of olecranon process without intraarticular extension of right ulna, initial encounter for closed fracture: Secondary | ICD-10-CM

## 2021-08-12 DIAGNOSIS — Z9911 Dependence on respirator [ventilator] status: Secondary | ICD-10-CM | POA: Diagnosis not present

## 2021-08-12 DIAGNOSIS — Z7189 Other specified counseling: Secondary | ICD-10-CM | POA: Diagnosis not present

## 2021-08-12 DIAGNOSIS — S27329A Contusion of lung, unspecified, initial encounter: Secondary | ICD-10-CM | POA: Diagnosis not present

## 2021-08-12 DIAGNOSIS — J80 Acute respiratory distress syndrome: Secondary | ICD-10-CM | POA: Diagnosis not present

## 2021-08-12 DIAGNOSIS — S42301A Unspecified fracture of shaft of humerus, right arm, initial encounter for closed fracture: Secondary | ICD-10-CM

## 2021-08-12 DIAGNOSIS — J9621 Acute and chronic respiratory failure with hypoxia: Secondary | ICD-10-CM | POA: Diagnosis not present

## 2021-08-12 DIAGNOSIS — J9601 Acute respiratory failure with hypoxia: Secondary | ICD-10-CM

## 2021-08-12 DIAGNOSIS — S299XXA Unspecified injury of thorax, initial encounter: Secondary | ICD-10-CM | POA: Diagnosis not present

## 2021-08-12 HISTORY — PX: ORIF ACETABULAR FRACTURE: SHX5029

## 2021-08-12 HISTORY — PX: TRACHEOSTOMY TUBE PLACEMENT: SHX814

## 2021-08-12 LAB — PREPARE RBC (CROSSMATCH)

## 2021-08-12 LAB — HEPATIC FUNCTION PANEL
ALT: 54 U/L — ABNORMAL HIGH (ref 0–44)
AST: 57 U/L — ABNORMAL HIGH (ref 15–41)
Albumin: 2.5 g/dL — ABNORMAL LOW (ref 3.5–5.0)
Alkaline Phosphatase: 130 U/L — ABNORMAL HIGH (ref 38–126)
Bilirubin, Direct: 0.4 mg/dL — ABNORMAL HIGH (ref 0.0–0.2)
Indirect Bilirubin: 1.1 mg/dL — ABNORMAL HIGH (ref 0.3–0.9)
Total Bilirubin: 1.5 mg/dL — ABNORMAL HIGH (ref 0.3–1.2)
Total Protein: 4.6 g/dL — ABNORMAL LOW (ref 6.5–8.1)

## 2021-08-12 LAB — CBC WITH DIFFERENTIAL/PLATELET
Abs Immature Granulocytes: 1.65 10*3/uL — ABNORMAL HIGH (ref 0.00–0.07)
Abs Immature Granulocytes: 1.16 10*3/uL — ABNORMAL HIGH (ref 0.00–0.07)
Basophils Absolute: 0.1 10*3/uL (ref 0.0–0.1)
Basophils Absolute: 0.1 10*3/uL (ref 0.0–0.1)
Basophils Relative: 1 %
Basophils Relative: 0 %
Eosinophils Absolute: 0.5 10*3/uL (ref 0.0–0.5)
Eosinophils Absolute: 0.4 10*3/uL (ref 0.0–0.5)
Eosinophils Relative: 3 %
Eosinophils Relative: 3 %
HCT: 25 % — ABNORMAL LOW (ref 39.0–52.0)
HCT: 23.1 % — ABNORMAL LOW (ref 39.0–52.0)
Hemoglobin: 8.2 g/dL — ABNORMAL LOW (ref 13.0–17.0)
Hemoglobin: 7.4 g/dL — ABNORMAL LOW (ref 13.0–17.0)
Immature Granulocytes: 11 %
Immature Granulocytes: 9 %
Lymphocytes Relative: 13 %
Lymphocytes Relative: 14 %
Lymphs Abs: 2 10*3/uL (ref 0.7–4.0)
Lymphs Abs: 1.7 10*3/uL (ref 0.7–4.0)
MCH: 28.8 pg (ref 26.0–34.0)
MCH: 28.1 pg (ref 26.0–34.0)
MCHC: 32.8 g/dL (ref 30.0–36.0)
MCHC: 32 g/dL (ref 30.0–36.0)
MCV: 87.7 fL (ref 80.0–100.0)
MCV: 87.8 fL (ref 80.0–100.0)
Monocytes Absolute: 1.3 10*3/uL — ABNORMAL HIGH (ref 0.1–1.0)
Monocytes Absolute: 1.1 10*3/uL — ABNORMAL HIGH (ref 0.1–1.0)
Monocytes Relative: 9 %
Monocytes Relative: 8 %
Neutro Abs: 9.6 10*3/uL — ABNORMAL HIGH (ref 1.7–7.7)
Neutro Abs: 8.2 10*3/uL — ABNORMAL HIGH (ref 1.7–7.7)
Neutrophils Relative %: 63 %
Neutrophils Relative %: 66 %
Platelets: 119 10*3/uL — ABNORMAL LOW (ref 150–400)
Platelets: 116 10*3/uL — ABNORMAL LOW (ref 150–400)
RBC: 2.85 MIL/uL — ABNORMAL LOW (ref 4.22–5.81)
RBC: 2.63 MIL/uL — ABNORMAL LOW (ref 4.22–5.81)
RDW: 19.1 % — ABNORMAL HIGH (ref 11.5–15.5)
RDW: 19.4 % — ABNORMAL HIGH (ref 11.5–15.5)
Smear Review: NORMAL
Smear Review: NORMAL
WBC: 15.1 10*3/uL — ABNORMAL HIGH (ref 4.0–10.5)
WBC: 12.5 10*3/uL — ABNORMAL HIGH (ref 4.0–10.5)
nRBC: 2.1 % — ABNORMAL HIGH (ref 0.0–0.2)
nRBC: 3 % — ABNORMAL HIGH (ref 0.0–0.2)

## 2021-08-12 LAB — BPAM RBC
Blood Product Expiration Date: 202307272359
Blood Product Expiration Date: 202307272359
Blood Product Expiration Date: 202307272359
Blood Product Expiration Date: 202307272359
Blood Product Expiration Date: 202307272359
Blood Product Expiration Date: 202307282359
Blood Product Expiration Date: 202307292359
Blood Product Expiration Date: 202308022359
ISSUE DATE / TIME: 202307130706
ISSUE DATE / TIME: 202307130848
ISSUE DATE / TIME: 202307150948
ISSUE DATE / TIME: 202307152132
ISSUE DATE / TIME: 202307160120
ISSUE DATE / TIME: 202307161953
Unit Type and Rh: 6200
Unit Type and Rh: 6200
Unit Type and Rh: 6200
Unit Type and Rh: 6200
Unit Type and Rh: 6200
Unit Type and Rh: 6200
Unit Type and Rh: 6200
Unit Type and Rh: 6200

## 2021-08-12 LAB — BASIC METABOLIC PANEL
Anion gap: 8 (ref 5–15)
Anion gap: 5 (ref 5–15)
BUN: 27 mg/dL — ABNORMAL HIGH (ref 6–20)
BUN: 30 mg/dL — ABNORMAL HIGH (ref 6–20)
CO2: 23 mmol/L (ref 22–32)
CO2: 20 mmol/L — ABNORMAL LOW (ref 22–32)
Calcium: 8.2 mg/dL — ABNORMAL LOW (ref 8.9–10.3)
Calcium: 8.1 mg/dL — ABNORMAL LOW (ref 8.9–10.3)
Chloride: 123 mmol/L — ABNORMAL HIGH (ref 98–111)
Chloride: 126 mmol/L — ABNORMAL HIGH (ref 98–111)
Creatinine, Ser: 0.9 mg/dL (ref 0.61–1.24)
Creatinine, Ser: 0.89 mg/dL (ref 0.61–1.24)
GFR, Estimated: >60 mL/min (ref >60)
GFR, Estimated: >60 mL/min (ref >60)
Glucose, Bld: 135 mg/dL — ABNORMAL HIGH (ref 70–99)
Glucose, Bld: 179 mg/dL — ABNORMAL HIGH (ref 70–99)
Potassium: 3.8 mmol/L (ref 3.5–5.1)
Potassium: 4.1 mmol/L (ref 3.5–5.1)
Sodium: 154 mmol/L — ABNORMAL HIGH (ref 135–145)
Sodium: 151 mmol/L — ABNORMAL HIGH (ref 135–145)

## 2021-08-12 LAB — FIBRINOGEN: Fibrinogen: 453 mg/dL (ref 210–475)

## 2021-08-12 LAB — POCT I-STAT 7, (LYTES, BLD GAS, ICA,H+H)
Acid-base deficit: 1 mmol/L (ref 0.0–2.0)
Acid-base deficit: 2 mmol/L (ref 0.0–2.0)
Acid-base deficit: 2 mmol/L (ref 0.0–2.0)
Acid-base deficit: 4 mmol/L — ABNORMAL HIGH (ref 0.0–2.0)
Acid-base deficit: 3 mmol/L — ABNORMAL HIGH (ref 0.0–2.0)
Bicarbonate: 23.9 mmol/L (ref 20.0–28.0)
Bicarbonate: 23.6 mmol/L (ref 20.0–28.0)
Bicarbonate: 21.3 mmol/L (ref 20.0–28.0)
Bicarbonate: 21.3 mmol/L (ref 20.0–28.0)
Bicarbonate: 22.2 mmol/L (ref 20.0–28.0)
Calcium, Ion: 1.29 mmol/L (ref 1.15–1.40)
Calcium, Ion: 1.3 mmol/L (ref 1.15–1.40)
Calcium, Ion: 1.27 mmol/L (ref 1.15–1.40)
Calcium, Ion: 1.31 mmol/L (ref 1.15–1.40)
Calcium, Ion: 1.29 mmol/L (ref 1.15–1.40)
HCT: 20 % — ABNORMAL LOW (ref 39.0–52.0)
HCT: 22 % — ABNORMAL LOW (ref 39.0–52.0)
HCT: 26 % — ABNORMAL LOW (ref 39.0–52.0)
HCT: 23 % — ABNORMAL LOW (ref 39.0–52.0)
HCT: 21 % — ABNORMAL LOW (ref 39.0–52.0)
Hemoglobin: 6.8 g/dL — CL (ref 13.0–17.0)
Hemoglobin: 7.5 g/dL — ABNORMAL LOW (ref 13.0–17.0)
Hemoglobin: 8.8 g/dL — ABNORMAL LOW (ref 13.0–17.0)
Hemoglobin: 7.8 g/dL — ABNORMAL LOW (ref 13.0–17.0)
Hemoglobin: 7.1 g/dL — ABNORMAL LOW (ref 13.0–17.0)
O2 Saturation: 95 %
O2 Saturation: 98 %
O2 Saturation: 100 %
O2 Saturation: 99 %
O2 Saturation: 99 %
Patient temperature: 37
Patient temperature: 36.8
Patient temperature: 36.7
Patient temperature: 36.6
Patient temperature: 36.8
Potassium: 3.9 mmol/L (ref 3.5–5.1)
Potassium: 4 mmol/L (ref 3.5–5.1)
Potassium: 4.1 mmol/L (ref 3.5–5.1)
Potassium: 4 mmol/L (ref 3.5–5.1)
Potassium: 4.3 mmol/L (ref 3.5–5.1)
Sodium: 158 mmol/L — ABNORMAL HIGH (ref 135–145)
Sodium: 157 mmol/L — ABNORMAL HIGH (ref 135–145)
Sodium: 154 mmol/L — ABNORMAL HIGH (ref 135–145)
Sodium: 153 mmol/L — ABNORMAL HIGH (ref 135–145)
Sodium: 153 mmol/L — ABNORMAL HIGH (ref 135–145)
TCO2: 25 mmol/L (ref 22–32)
TCO2: 25 mmol/L (ref 22–32)
TCO2: 22 mmol/L (ref 22–32)
TCO2: 22 mmol/L (ref 22–32)
TCO2: 23 mmol/L (ref 22–32)
pCO2 arterial: 40.9 mmHg (ref 32–48)
pCO2 arterial: 41 mmHg (ref 32–48)
pCO2 arterial: 30.8 mmHg — ABNORMAL LOW (ref 32–48)
pCO2 arterial: 38.1 mmHg (ref 32–48)
pCO2 arterial: 39.3 mmHg (ref 32–48)
pH, Arterial: 7.375 (ref 7.35–7.45)
pH, Arterial: 7.368 (ref 7.35–7.45)
pH, Arterial: 7.446 (ref 7.35–7.45)
pH, Arterial: 7.353 (ref 7.35–7.45)
pH, Arterial: 7.36 (ref 7.35–7.45)
pO2, Arterial: 79 mmHg — ABNORMAL LOW (ref 83–108)
pO2, Arterial: 104 mmHg (ref 83–108)
pO2, Arterial: 197 mmHg — ABNORMAL HIGH (ref 83–108)
pO2, Arterial: 161 mmHg — ABNORMAL HIGH (ref 83–108)
pO2, Arterial: 127 mmHg — ABNORMAL HIGH (ref 83–108)

## 2021-08-12 LAB — CBC
HCT: 24.8 % — ABNORMAL LOW (ref 39.0–52.0)
HCT: 26.8 % — ABNORMAL LOW (ref 39.0–52.0)
Hemoglobin: 7.9 g/dL — ABNORMAL LOW (ref 13.0–17.0)
Hemoglobin: 8.6 g/dL — ABNORMAL LOW (ref 13.0–17.0)
MCH: 29.3 pg (ref 26.0–34.0)
MCH: 28.5 pg (ref 26.0–34.0)
MCHC: 31.9 g/dL (ref 30.0–36.0)
MCHC: 32.1 g/dL (ref 30.0–36.0)
MCV: 91.9 fL (ref 80.0–100.0)
MCV: 88.7 fL (ref 80.0–100.0)
Platelets: 123 10*3/uL — ABNORMAL LOW (ref 150–400)
Platelets: 117 10*3/uL — ABNORMAL LOW (ref 150–400)
RBC: 2.7 MIL/uL — ABNORMAL LOW (ref 4.22–5.81)
RBC: 3.02 MIL/uL — ABNORMAL LOW (ref 4.22–5.81)
RDW: 18.6 % — ABNORMAL HIGH (ref 11.5–15.5)
RDW: 19 % — ABNORMAL HIGH (ref 11.5–15.5)
WBC: 10.6 10*3/uL — ABNORMAL HIGH (ref 4.0–10.5)
WBC: 14.1 10*3/uL — ABNORMAL HIGH (ref 4.0–10.5)
nRBC: 2.8 % — ABNORMAL HIGH (ref 0.0–0.2)
nRBC: 2.2 % — ABNORMAL HIGH (ref 0.0–0.2)

## 2021-08-12 LAB — TYPE AND SCREEN
ABO/RH(D): A POS
Antibody Screen: NEGATIVE
Unit division: 0
Unit division: 0
Unit division: 0
Unit division: 0
Unit division: 0
Unit division: 0
Unit division: 0
Unit division: 0

## 2021-08-12 LAB — GLUCOSE, CAPILLARY
Glucose-Capillary: 108 mg/dL — ABNORMAL HIGH (ref 70–99)
Glucose-Capillary: 109 mg/dL — ABNORMAL HIGH (ref 70–99)
Glucose-Capillary: 111 mg/dL — ABNORMAL HIGH (ref 70–99)
Glucose-Capillary: 142 mg/dL — ABNORMAL HIGH (ref 70–99)
Glucose-Capillary: 170 mg/dL — ABNORMAL HIGH (ref 70–99)
Glucose-Capillary: 172 mg/dL — ABNORMAL HIGH (ref 70–99)

## 2021-08-12 LAB — APTT
aPTT: 57 seconds — ABNORMAL HIGH (ref 24–36)
aPTT: 52 seconds — ABNORMAL HIGH (ref 24–36)

## 2021-08-12 LAB — LACTIC ACID, PLASMA
Lactic Acid, Venous: 1.1 mmol/L (ref 0.5–1.9)
Lactic Acid, Venous: 1.2 mmol/L (ref 0.5–1.9)

## 2021-08-12 LAB — HEPARIN LEVEL (UNFRACTIONATED)
Heparin Unfractionated: <0.1 IU/mL — ABNORMAL LOW (ref 0.30–0.70)
Heparin Unfractionated: <0.1 IU/mL — ABNORMAL LOW (ref 0.30–0.70)

## 2021-08-12 LAB — TRIGLYCERIDES: Triglycerides: 383 mg/dL — ABNORMAL HIGH (ref <150)

## 2021-08-12 LAB — LACTATE DEHYDROGENASE: LDH: 498 U/L — ABNORMAL HIGH (ref 98–192)

## 2021-08-12 LAB — PROTIME-INR
INR: 1.3 — ABNORMAL HIGH (ref 0.8–1.2)
Prothrombin Time: 15.8 seconds — ABNORMAL HIGH (ref 11.4–15.2)

## 2021-08-12 SURGERY — OPEN REDUCTION INTERNAL FIXATION (ORIF) ACETABULAR FRACTURE
Anesthesia: General | Site: Thigh | Laterality: Right

## 2021-08-12 MED ORDER — LIDOCAINE-EPINEPHRINE 1.5 %-1:200,000 OPTIME - NO CHARGE
INTRAMUSCULAR | Status: DC | PRN
  Administered 2021-08-12: 5 mL via SUBCUTANEOUS

## 2021-08-12 MED ORDER — FENTANYL CITRATE (PF) 250 MCG/5ML IJ SOLN
INTRAMUSCULAR | Status: DC | PRN
  Administered 2021-08-12 (×3): 50 ug via INTRAVENOUS
  Administered 2021-08-12: 100 ug via INTRAVENOUS

## 2021-08-12 MED ORDER — PROPOFOL 10 MG/ML IV BOLUS
INTRAVENOUS | Status: AC
  Filled 2021-08-12: qty 20

## 2021-08-12 MED ORDER — MIDAZOLAM HCL 2 MG/2ML IJ SOLN
INTRAMUSCULAR | Status: AC
  Filled 2021-08-12: qty 2

## 2021-08-12 MED ORDER — ROCURONIUM BROMIDE 10 MG/ML (PF) SYRINGE
PREFILLED_SYRINGE | INTRAVENOUS | Status: DC | PRN
  Administered 2021-08-12 (×2): 100 mg via INTRAVENOUS
  Administered 2021-08-12 (×2): 50 mg via INTRAVENOUS

## 2021-08-12 MED ORDER — 0.9 % SODIUM CHLORIDE (POUR BTL) OPTIME
TOPICAL | Status: DC | PRN
  Administered 2021-08-12: 1000 mL
  Administered 2021-08-12 (×2): 3000 mL
  Administered 2021-08-12: 1000 mL

## 2021-08-12 MED ORDER — SODIUM CHLORIDE 0.9 % IV SOLN
0.5000 mg/h | INTRAVENOUS | Status: DC
  Administered 2021-08-12: 4 mg/h via INTRAVENOUS
  Administered 2021-08-13: 3 mg/h via INTRAVENOUS
  Administered 2021-08-13: 4 mg/h via INTRAVENOUS
  Administered 2021-08-14: 3 mg/h via INTRAVENOUS
  Administered 2021-08-14 – 2021-08-16 (×5): 4 mg/h via INTRAVENOUS
  Administered 2021-08-17: 3 mg/h via INTRAVENOUS
  Administered 2021-08-17: 4 mg/h via INTRAVENOUS
  Administered 2021-08-19 – 2021-08-20 (×3): 3 mg/h via INTRAVENOUS
  Filled 2021-08-12 (×17): qty 5

## 2021-08-12 MED ORDER — HEPARIN (PORCINE) 25000 UT/250ML-% IV SOLN
900.0000 [IU]/h | INTRAVENOUS | Status: DC
Start: 2021-08-13 — End: 2021-08-13
  Administered 2021-08-13: 900 [IU]/h via INTRAVENOUS

## 2021-08-12 MED ORDER — POTASSIUM CHLORIDE 20 MEQ PO PACK
40.0000 meq | PACK | Freq: Once | ORAL | Status: AC
Start: 2021-08-12 — End: 2021-08-12
  Administered 2021-08-12: 40 meq
  Filled 2021-08-12: qty 2

## 2021-08-12 MED ORDER — LACTATED RINGERS IV SOLN: INTRAVENOUS | Status: DC | PRN

## 2021-08-12 MED ORDER — SODIUM CHLORIDE 0.9% IV SOLUTION: Freq: Once | INTRAVENOUS | Status: AC

## 2021-08-12 MED ORDER — NOREPINEPHRINE 4 MG/250ML-% IV SOLN
0.0000 ug/min | INTRAVENOUS | Status: DC
  Administered 2021-08-12: 4 ug/min via INTRAVENOUS
  Administered 2021-08-14: 2 ug/min via INTRAVENOUS
  Administered 2021-08-15: 5 ug/min via INTRAVENOUS
  Filled 2021-08-12 (×3): qty 250

## 2021-08-12 MED ORDER — SODIUM CHLORIDE 0.9% IV SOLUTION: Freq: Once | INTRAVENOUS | Status: DC

## 2021-08-12 MED ORDER — FENTANYL CITRATE (PF) 250 MCG/5ML IJ SOLN
INTRAMUSCULAR | Status: AC
  Filled 2021-08-12: qty 5

## 2021-08-12 MED ORDER — DEXMEDETOMIDINE (PRECEDEX) IN NS 20 MCG/5ML (4 MCG/ML) IV SYRINGE
PREFILLED_SYRINGE | INTRAVENOUS | Status: DC | PRN
  Administered 2021-08-12: 8 ug via INTRAVENOUS
  Administered 2021-08-12: 12 ug via INTRAVENOUS
  Administered 2021-08-12 (×3): 8 ug via INTRAVENOUS

## 2021-08-12 MED ORDER — SODIUM CHLORIDE 0.9 % IV SOLN
0.5000 mg/h | INTRAVENOUS | Status: DC
  Administered 2021-08-12: 2 mg/h via INTRAVENOUS
  Filled 2021-08-12: qty 20

## 2021-08-12 SURGICAL SUPPLY — 116 items
BAG COUNTER SPONGE SURGICOUNT (BAG) ×10 IMPLANT
BAG SPNG CNTER NS LX DISP (BAG) ×8
BLADE AVERAGE 25X9 (BLADE) ×5 IMPLANT
BLADE CLIPPER SURG (BLADE) ×5 IMPLANT
BLADE SURG 10 STRL SS (BLADE) ×3 IMPLANT
BNDG CMPR 9X4 STRL LF SNTH (GAUZE/BANDAGES/DRESSINGS) ×4
BNDG CMPR MED 10X6 ELC LF (GAUZE/BANDAGES/DRESSINGS) ×4
BNDG COHESIVE 4X5 TAN STRL (GAUZE/BANDAGES/DRESSINGS) ×5 IMPLANT
BNDG ELASTIC 3X5.8 VLCR STR LF (GAUZE/BANDAGES/DRESSINGS) ×5 IMPLANT
BNDG ELASTIC 4X5.8 VLCR STR LF (GAUZE/BANDAGES/DRESSINGS) ×5 IMPLANT
BNDG ELASTIC 6X10 VLCR STRL LF (GAUZE/BANDAGES/DRESSINGS) ×3 IMPLANT
BNDG ELASTIC 6X5.8 VLCR STR LF (GAUZE/BANDAGES/DRESSINGS) ×5 IMPLANT
BNDG ESMARK 4X9 LF (GAUZE/BANDAGES/DRESSINGS) ×5 IMPLANT
BNDG GAUZE ELAST 4 BULKY (GAUZE/BANDAGES/DRESSINGS) ×10 IMPLANT
BRUSH SCRUB EZ PLAIN DRY (MISCELLANEOUS) ×10 IMPLANT
CANISTER SUCT 3000ML PPV (MISCELLANEOUS) ×5 IMPLANT
CANISTER WOUNDNEG PRESSURE 500 (CANNISTER) ×3 IMPLANT
CLEANER TIP ELECTROSURG 2X2 (MISCELLANEOUS) ×5 IMPLANT
COVER SURGICAL LIGHT HANDLE (MISCELLANEOUS) ×24 IMPLANT
DRAIN PENROSE 1/4X12 LTX STRL (WOUND CARE) ×3 IMPLANT
DRAPE C-ARM 42X72 X-RAY (DRAPES) ×5 IMPLANT
DRAPE C-ARMOR (DRAPES) ×5 IMPLANT
DRAPE DERMATAC (DRAPES) ×3 IMPLANT
DRAPE HALF SHEET 40X57 (DRAPES) ×6 IMPLANT
DRAPE INCISE IOBAN 66X45 STRL (DRAPES) ×5 IMPLANT
DRAPE INCISE IOBAN 85X60 (DRAPES) ×5 IMPLANT
DRAPE ORTHO SPLIT 77X108 STRL (DRAPES) ×10
DRAPE SPLIT 6X30 W/TAPE (DRAPES) ×6 IMPLANT
DRAPE SURG ORHT 6 SPLT 77X108 (DRAPES) ×8 IMPLANT
DRAPE U-SHAPE 47X51 STRL (DRAPES) ×10 IMPLANT
DRAPE UTILITY XL STRL (DRAPES) ×5 IMPLANT
DRESSING VERAFLO CLEANS CC MED (GAUZE/BANDAGES/DRESSINGS) ×4 IMPLANT
DRSG ADAPTIC 3X8 NADH LF (GAUZE/BANDAGES/DRESSINGS) ×5 IMPLANT
DRSG CUTIMED SORBACT 7X9 (GAUZE/BANDAGES/DRESSINGS) ×3 IMPLANT
DRSG DRAWTEX TRACH 4X4 (GAUZE/BANDAGES/DRESSINGS) ×3 IMPLANT
DRSG MEPITEL 4X7.2 (GAUZE/BANDAGES/DRESSINGS) ×5 IMPLANT
DRSG PAD ABDOMINAL 8X10 ST (GAUZE/BANDAGES/DRESSINGS) ×5 IMPLANT
DRSG TUBE GAUZE 1X5YD SZ2 (GAUZE/BANDAGES/DRESSINGS) ×3 IMPLANT
DRSG VERAFLO CLEANSE CC MED (GAUZE/BANDAGES/DRESSINGS) ×10
ELECT BLADE 6.5 EXT (BLADE) ×5 IMPLANT
ELECT CAUTERY BLADE 6.4 (BLADE) ×5 IMPLANT
ELECT REM PT RETURN 9FT ADLT (ELECTROSURGICAL) ×10
ELECTRODE REM PT RTRN 9FT ADLT (ELECTROSURGICAL) ×8 IMPLANT
EVACUATOR 1/8 PVC DRAIN (DRAIN) ×3 IMPLANT
GAUZE 4X4 16PLY ~~LOC~~+RFID DBL (SPONGE) ×5 IMPLANT
GAUZE PAD ABD 8X10 STRL (GAUZE/BANDAGES/DRESSINGS) ×3 IMPLANT
GAUZE SPONGE 4X4 12PLY STRL (GAUZE/BANDAGES/DRESSINGS) ×10 IMPLANT
GAUZE XEROFORM 1X8 LF (GAUZE/BANDAGES/DRESSINGS) ×5 IMPLANT
GAUZE XEROFORM 5X9 LF (GAUZE/BANDAGES/DRESSINGS) ×5 IMPLANT
GLOVE BIO SURGEON STRL SZ7.5 (GLOVE) ×5 IMPLANT
GLOVE BIO SURGEON STRL SZ8 (GLOVE) ×10 IMPLANT
GLOVE BIOGEL M 8.0 STRL (GLOVE) ×3 IMPLANT
GLOVE BIOGEL PI IND STRL 7.5 (GLOVE) ×4 IMPLANT
GLOVE BIOGEL PI IND STRL 8 (GLOVE) ×8 IMPLANT
GLOVE BIOGEL PI INDICATOR 7.5 (GLOVE) ×5
GLOVE BIOGEL PI INDICATOR 8 (GLOVE) ×10
GLOVE SURG ORTHO LTX SZ7.5 (GLOVE) ×10 IMPLANT
GOWN STRL REUS W/ TWL LRG LVL3 (GOWN DISPOSABLE) ×12 IMPLANT
GOWN STRL REUS W/ TWL XL LVL3 (GOWN DISPOSABLE) ×12 IMPLANT
GOWN STRL REUS W/TWL LRG LVL3 (GOWN DISPOSABLE) ×15
GOWN STRL REUS W/TWL XL LVL3 (GOWN DISPOSABLE) ×15
GRAFT MYRIAD 20X20 (Graft) ×6 IMPLANT
HANDPIECE INTERPULSE COAX TIP (DISPOSABLE) ×5
KIT BASIN OR (CUSTOM PROCEDURE TRAY) ×10 IMPLANT
KIT TURNOVER KIT B (KITS) ×10 IMPLANT
MANIFOLD NEPTUNE II (INSTRUMENTS) ×5 IMPLANT
NDL HYPO 25X1 1.5 SAFETY (NEEDLE) IMPLANT
NEEDLE HYPO 25X1 1.5 SAFETY (NEEDLE) ×5 IMPLANT
NS IRRIG 1000ML POUR BTL (IV SOLUTION) ×10 IMPLANT
PACK EENT II TURBAN DRAPE (CUSTOM PROCEDURE TRAY) ×5 IMPLANT
PACK ORTHO EXTREMITY (CUSTOM PROCEDURE TRAY) ×5 IMPLANT
PACK TOTAL JOINT (CUSTOM PROCEDURE TRAY) ×5 IMPLANT
PAD ARMBOARD 7.5X6 YLW CONV (MISCELLANEOUS) ×20 IMPLANT
PAD CAST 4YDX4 CTTN HI CHSV (CAST SUPPLIES) ×4 IMPLANT
PAD NEG PRESSURE SENSATRAC (MISCELLANEOUS) ×3 IMPLANT
PADDING CAST COTTON 4X4 STRL (CAST SUPPLIES) ×5
PENCIL BUTTON HOLSTER BLD 10FT (ELECTRODE) ×5 IMPLANT
POWDER MYRIAD MORCELLS 1000MG (Miscellaneous) ×6 IMPLANT
RETRIEVER SUT HEWSON (MISCELLANEOUS) ×5 IMPLANT
SET HNDPC FAN SPRY TIP SCT (DISPOSABLE) ×4 IMPLANT
SPONGE DRAIN TRACH 4X4 STRL 2S (GAUZE/BANDAGES/DRESSINGS) ×3 IMPLANT
SPONGE INTESTINAL PEANUT (DISPOSABLE) ×5 IMPLANT
SPONGE T-LAP 18X18 ~~LOC~~+RFID (SPONGE) ×10 IMPLANT
STAPLER VISISTAT 35W (STAPLE) ×5 IMPLANT
STOCKINETTE IMPERVIOUS 9X36 MD (GAUZE/BANDAGES/DRESSINGS) ×3 IMPLANT
STRIP CLOSURE SKIN 1/2X4 (GAUZE/BANDAGES/DRESSINGS) ×5 IMPLANT
SUCTION FRAZIER HANDLE 10FR (MISCELLANEOUS) ×5
SUCTION TUBE FRAZIER 10FR DISP (MISCELLANEOUS) ×4 IMPLANT
SUT ETHILON 2 0 FS 18 (SUTURE) ×10 IMPLANT
SUT ETHILON 2 0 PSLX (SUTURE) ×16 IMPLANT
SUT FIBERWIRE #2 38 T-5 BLUE (SUTURE) ×10
SUT PDS AB 2-0 CT1 27 (SUTURE) ×6 IMPLANT
SUT PROLENE 3 0 PS 2 (SUTURE) ×10 IMPLANT
SUT SILK 2 0SH CR/8 30 (SUTURE) ×3 IMPLANT
SUT VIC AB 0 CT1 27 (SUTURE) ×10
SUT VIC AB 0 CT1 27XBRD ANBCTR (SUTURE) ×8 IMPLANT
SUT VIC AB 1 CT1 18XCR BRD 8 (SUTURE) ×4 IMPLANT
SUT VIC AB 1 CT1 27 (SUTURE) ×5
SUT VIC AB 1 CT1 27XBRD ANBCTR (SUTURE) ×4 IMPLANT
SUT VIC AB 1 CT1 8-18 (SUTURE) ×5
SUT VIC AB 2-0 CT1 27 (SUTURE) ×10
SUT VIC AB 2-0 CT1 TAPERPNT 27 (SUTURE) ×8 IMPLANT
SUT VIC AB 2-0 CT2 18 VCP726D (SUTURE) ×3 IMPLANT
SUT VIC AB 2-0 CT3 27 (SUTURE) ×6 IMPLANT
SUT VICRYL AB 3 0 TIES (SUTURE) ×5 IMPLANT
SUTURE FIBERWR #2 38 T-5 BLUE (SUTURE) ×8 IMPLANT
SYR 20ML LL LF (SYRINGE) ×5 IMPLANT
SYR 5ML LL (SYRINGE) ×6 IMPLANT
SYR CONTROL 10ML LL (SYRINGE) ×5 IMPLANT
TOWEL GREEN STERILE (TOWEL DISPOSABLE) ×20 IMPLANT
TOWEL GREEN STERILE FF (TOWEL DISPOSABLE) ×15 IMPLANT
TRAY W/TRACH TUBE 8.5 (MISCELLANEOUS) ×3 IMPLANT
TUBE CONNECTING 12X1/4 (SUCTIONS) ×13 IMPLANT
UNDERPAD 30X36 HEAVY ABSORB (UNDERPADS AND DIAPERS) ×5 IMPLANT
WATER STERILE IRR 1000ML POUR (IV SOLUTION) ×5 IMPLANT
YANKAUER SUCT BULB TIP NO VENT (SUCTIONS) ×5 IMPLANT
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
## 2021-08-12 NOTE — Anesthesia Postprocedure Evaluation (Signed)
Anesthesia Post Note  Patient: Jeff [name]  Procedure(s) Performed: Debridment of Right Lateral Thigh, Biologic Graft Placement (40 x 20cm), Wound Vac Exchange, Insertion of traction (Right: Thigh) TRACHEOSTOMY     Patient location during evaluation: SICU Anesthesia Type: General Level of consciousness: sedated and patient remains intubated per anesthesia plan Pain management: pain level controlled Vital Signs Assessment: post-procedure vital signs reviewed and stable Respiratory status: patient on ventilator - see flowsheet for VS and patient remains intubated per anesthesia plan Cardiovascular status: stable Anesthetic complications: no   No notable events documented.  Last Vitals:  Vitals:   08/12/21 1357 08/12/21 1400  BP:    Pulse:    Resp: 15 15  Temp: 36.8 C 36.8 C  SpO2:      Last Pain:  Vitals:   08/12/21 1200  TempSrc: Bladder  PainSc:                  Lewie Loron

## 2021-08-12 NOTE — Progress Notes (Signed)
Patient ID: Jeff [name], male   DOB: 08/16/2000, 21 y.o.   MRN: [id number] Follow up - Trauma Critical Care   Patient Details:    Jeff [name] is an 21 y.o. male.  Lines/tubes : Airway 7.5 mm (Active)  Secured at (cm) 25 cm 08/12/21 0328  Measured From Lips 08/12/21 0328  Secured Location Center 08/12/21 0328  Secured By [employer] 08/12/21 0328  Tube Holder Repositioned Yes 08/12/21 0328  Prone position No 08/12/21 0328  Cuff Pressure (cm H2O) Green OR [facility] 08/11/21 1920  Site Condition Dry 08/12/21 0328     CVC Triple Lumen 08/07/21 Left Internal jugular (Active)  Indication for Insertion or Continuance of Line Vasoactive infusions 08/11/21 2000  Site Assessment Clean, Dry, Intact 08/11/21 2000  Proximal Lumen Status Infusing 08/11/21 2000  Medial Lumen Status Infusing 08/11/21 2000  Distal Lumen Status Infusing 08/11/21 2000  Dressing Type Transparent 08/11/21 2000  Dressing Status Antimicrobial disc in place 08/11/21 2000  Line Care Connections checked and tightened 08/11/21 2000  Dressing Intervention Antimicrobial disc changed;Dressing changed 08/11/21 1600  Dressing Change Due 08/14/21 08/11/21 2000     Arterial Line 08/05/21 Left Femoral (Active)  Site Assessment Intact 08/11/21 2000  Line Status Pulsatile blood flow 08/11/21 2000  Art Line Waveform Appropriate;Square wave test performed 08/11/21 2000  Art Line Interventions Zeroed and calibrated;Leveled;Tubing changed;Connections checked and tightened 08/11/21 2000  Color/Movement/Sensation Capillary refill less than 3 sec 08/11/21 2000  Dressing Type Transparent 08/11/21 2000  Dressing Status Clean, Dry, Intact 08/11/21 2000  Interventions Dressing changed;Antimicrobial disc changed 08/11/21 1600  Dressing Change Due 08/18/21 08/11/21 2000     Chest Tube 1 Lateral;Left Pleural (Active)  Status -40 cm H2O 08/11/21 2000  Chest Tube Air Leak None 08/11/21 2000  Patency  Intervention Tip/tilt 08/11/21 0800  Drainage Description Serosanguineous 08/11/21 2000  Dressing Status Clean, Dry, Intact 08/11/21 2000  Dressing Intervention Dressing changed 08/11/21 1200  Site Assessment Clean 08/11/21 2000  Surrounding Skin Unable to view 08/11/21 2000  Intake (mL) 0 mL 08/10/21 1600  Output (mL) 20 mL 08/12/21 0400     Negative Pressure Wound Therapy Thigh Right;Upper (Active)  Wound Image      08/09/21 2000  Last dressing change 08/07/21 08/08/21 2000  Site / Wound Assessment Clean;Dry 08/11/21 2000  Peri-wound Assessment Erythema (non-blanchable) 08/11/21 2000  Cycle Continuous 08/11/21 0800  Target Pressure (mmHg) 125 08/11/21 2000  Instillation Volume 0 mL 08/10/21 1600  Canister Changed Yes 08/11/21 1600  Machine plugged into wall outlet (NOT bed outlet) Yes 08/11/21 0800  Dressing Status Intact 08/11/21 0800  Drainage Amount Moderate 08/09/21 2000  Drainage Description Sanguineous 08/11/21 0800  Output (mL) 30 mL 08/11/21 1600     NG/OG Vented/Dual Lumen 18 Fr. Oral (Active)  Tube Position (Required) External length of tube 08/11/21 2000  Measurement (cm) (Required) 67 cm 08/11/21 2000  Ongoing Placement Verification (Required) (See row information) Yes 08/11/21 2000  Site Assessment Clean, Dry, Intact 08/11/21 2000  Interventions X-ray 08/11/21 0800  Status Low intermittent suction 08/11/21 2000  Amount of suction -80 mmHg 08/11/21 0800  Drainage Appearance Yellow 08/11/21 2000  Intake (mL) 30 mL 08/11/21 2000  Output (mL) 400 mL 08/11/21 2000     Flatus Tube/Pouch (Active)  Daily care Skin around tube assessed;Skin barrier applied to rectal area;Assess location of position indicator line;Flushed tube with 32mL water (document as intake);Bag changed (every 24 hours) 08/11/21 1600  Output (mL) 300 mL 08/11/21 1700  Intake (mL) 30 mL 08/11/21 1700     Urethral Catheter [name], RN Temperature probe 16 Fr. (Active)  Indication for Insertion or  Continuance of Catheter Therapy based on hourly urine output monitoring and documentation for critical condition (NOT STRICT I&O) 08/11/21 2000  Site Assessment Clean, Dry, Intact 08/11/21 2000  Catheter Maintenance Bag below level of bladder;Catheter secured;Drainage bag/tubing not touching floor;Insertion date on drainage bag;Seal intact;Bag emptied prior to transport;No dependent loops 08/11/21 2000  Collection Container Standard drainage bag 08/11/21 2000  Securement Method Securing device (Describe) 08/11/21 2000  Urinary Catheter Interventions (if applicable) Unclamped 08/10/21 2000  Input (mL) 0 mL 08/10/21 1200  Output (mL) 100 mL 08/12/21 0600    Microbiology/Sepsis markers: Results for orders placed or performed during the hospital encounter of 08/05/21  MRSA Next Gen by PCR, Nasal     Status: None   Collection Time: 08/06/21  2:58 AM   Specimen: Nasal Mucosa; Nasal Swab  Result Value Ref Range Status   MRSA by PCR Next Gen NOT DETECTED NOT DETECTED Final    Comment: (NOTE) The GeneXpert MRSA Assay (FDA approved for NASAL specimens only), is one component of a comprehensive MRSA colonization surveillance program. It is not intended to diagnose MRSA infection nor to guide or monitor treatment for MRSA infections. Test performance is not FDA approved in patients less than 568 years old. Performed at [facility], [address] [place], [place] [address]   Resp Panel by RT-PCR (Flu A&B, Covid) Anterior Nasal Swab     Status: None   Collection Time: 08/06/21  3:45 AM   Specimen: Anterior Nasal Swab  Result Value Ref Range Status   SARS Coronavirus 2 by RT PCR NEGATIVE NEGATIVE Final    Comment: (NOTE) SARS-CoV-2 target nucleic acids are NOT DETECTED.  The SARS-CoV-2 RNA is generally detectable in upper respiratory specimens during the acute phase of infection. The lowest concentration of SARS-CoV-2 viral copies this assay can detect is 138 copies/mL. A negative  result does not preclude SARS-Cov-2 infection and should not be used as the sole basis for treatment or other patient management decisions. A negative result may occur with  improper specimen collection/handling, submission of specimen other than nasopharyngeal swab, presence of viral mutation(s) within the areas targeted by this assay, and inadequate number of viral copies(<138 copies/mL). A negative result must be combined with clinical observations, patient history, and epidemiological information. The expected result is Negative.  Fact Sheet for Patients:  BloggerCourse.comhttps://www.fda.gov/media/152166/download  Fact Sheet for Healthcare Providers:  SeriousBroker.ithttps://www.fda.gov/media/152162/download  This test is no t yet approved or cleared by the [place] FDA and  has been authorized for detection and/or diagnosis of SARS-CoV-2 by FDA under an Emergency Use Authorization (EUA). This EUA will remain  in effect (meaning this test can be used) for the duration of the COVID-19 declaration under Section 564(b)(1) of the Act, 21 U.S.C.section 360bbb-3(b)(1), unless the authorization is terminated  or revoked sooner.       Influenza A by PCR NEGATIVE NEGATIVE Final   Influenza B by PCR NEGATIVE NEGATIVE Final    Comment: (NOTE) The Xpert Xpress SARS-CoV-2/FLU/RSV plus assay is intended as an aid in the diagnosis of influenza from Nasopharyngeal swab specimens and should not be used as a sole basis for treatment. Nasal washings and aspirates are unacceptable for Xpert Xpress SARS-CoV-2/FLU/RSV testing.  Fact Sheet for Patients: BloggerCourse.comhttps://www.fda.gov/media/152166/download  Fact Sheet for Healthcare Providers: SeriousBroker.ithttps://www.fda.gov/media/152162/download  This test is not yet approved or cleared by the  [place] [employer] and has been authorized for detection and/or diagnosis of SARS-CoV-2 by FDA under an TEFL teacher (EUA). This EUA will remain in effect (meaning this test can be used)  for the duration of the COVID-19 declaration under Section 564(b)(1) of the Act, 21 U.S.C. section 360bbb-3(b)(1), unless the authorization is terminated or revoked.  Performed at [facility], [address] [place], [place] [address]   Culture, Respiratory w Gram Stain     Status: None   Collection Time: 08/07/21  8:01 PM   Specimen: Tracheal Aspirate; Respiratory  Result Value Ref Range Status   Specimen Description TRACHEAL ASPIRATE  Final   Special Requests NONE  Final   Gram Stain   Final    RARE WBC PRESENT,BOTH PMN AND MONONUCLEAR NO ORGANISMS SEEN    Culture   Final    NO GROWTH 2 DAYS Performed at [facility], [address], [place], [place] [address]    Report Status 08/10/2021 FINAL  Final    Anti-infectives:  Anti-infectives (From admission, onward)    Start     Dose/Rate Route Frequency Ordered Stop   08/10/21 1000  vancomycin (VANCOREADY) IVPB 1500 mg/300 mL        1,500 mg 150 mL/hr over 120 Minutes Intravenous Every 24 hours 08/09/21 0820     08/09/21 0915  vancomycin (VANCOREADY) IVPB 2000 mg/400 mL        2,000 mg 200 mL/hr over 120 Minutes Intravenous  Once 08/09/21 0820 08/09/21 1050   08/09/21 0830  vancomycin (VANCOREADY) IVPB 1750 mg/350 mL  Status:  Discontinued        1,750 mg 175 mL/hr over 120 Minutes Intravenous  Once 08/09/21 0733 08/09/21 0820   08/07/21 1105  tobramycin (NEBCIN) powder  Status:  Discontinued          As needed 08/07/21 1106 08/07/21 1224   08/07/21 1104  vancomycin (VANCOCIN) powder  Status:  Discontinued          As needed 08/07/21 1105 08/07/21 1224   08/06/21 1000  vancomycin (VANCOREADY) IVPB 750 mg/150 mL  Status:  Discontinued        750 mg 150 mL/hr over 60 Minutes Intravenous Every 12 hours 08/05/21 2229 08/05/21 2313   08/06/21 0600  meropenem (MERREM) 1 g in sodium chloride 0.9 % 100 mL IVPB        1 g 200 mL/hr over 30 Minutes Intravenous Every 8 hours 08/05/21 2352     08/06/21 0015  vancomycin  (VANCOREADY) IVPB 750 mg/150 mL        750 mg 150 mL/hr over 60 Minutes Intravenous  Once 08/05/21 2313 08/06/21 0208   08/05/21 2317  vancomycin variable dose per unstable renal function (pharmacist dosing)  Status:  Discontinued         Does not apply See admin instructions 08/05/21 2317 08/06/21 0917   08/05/21 2215  Ampicillin-Sulbactam (UNASYN) 3 g in sodium chloride 0.9 % 100 mL IVPB  Status:  Discontinued        3 g 200 mL/hr over 30 Minutes Intravenous Every 6 hours 08/05/21 2209 08/05/21 2351   08/05/21 2215  vancomycin (VANCOCIN) IVPB 1000 mg/200 mL premix        1,000 mg 200 mL/hr over 60 Minutes Intravenous  Once 08/05/21 2213 08/05/21 2217   08/05/21 1815  ceFAZolin (ANCEF) IVPB 2g/100 mL premix        2 g 200 mL/hr over 30 Minutes Intravenous NOW  08/05/21 1801 08/05/21 1936      Consults: Treatment Team:  Jadene Pierini, MD Haddix, Gillie Manners, MD    Studies:    Events:  Subjective:    Overnight Issues:   Objective:  Vital signs for last 24 hours: Temp:  [97.9 F (36.6 C)-99 F (37.2 C)] 99 F (37.2 C) (07/18 0600) Pulse Rate:  [80-123] 102 (07/18 0600) Resp:  [15-22] 15 (07/18 0600) BP: (107)/(60) 107/60 (07/17 1513) SpO2:  [92 %-100 %] 97 % (07/18 0600) Arterial Line BP: (97-173)/(47-84) 123/53 (07/18 0600) FiO2 (%):  [50 %-80 %] 80 % (07/18 0532)  Hemodynamic parameters for last 24 hours:    Intake/Output from previous day: 07/17 0701 - 07/18 0700 In: 4060.5 [I.V.:1290.9; NG/GT:1939.8; IV Piggyback:799.8] Out: 5665 [Urine:3725; Emesis/NG output:1300; Drains:90; Stool:300; Chest Tube:250]  Intake/Output this shift: No intake/output data recorded.  Vent settings for last 24 hours: Vent Mode: PCV FiO2 (%):  [50 %-80 %] 80 % Set Rate:  [15 bmp] 15 bmp PEEP:  [14 cmH20] 14 cmH20 Pressure Support:  [12 cmH20] 12 cmH20 Plateau Pressure:  [21 cmH20-25 cmH20] 21 cmH20  Physical Exam:  General: Sedated on the ventilator and ECMO Neuro:  Sedated HEENT/Neck: ETT Resp: Some rhonchi bilaterally and decreased in the bases CVS: RRR GI: Soft, nontender Extremities: Moderate lower extremity edema without erythema  Results for orders placed or performed during the hospital encounter of 08/05/21 (from the past 24 hour(s))  Glucose, capillary     Status: Abnormal   Collection Time: 08/11/21  8:33 AM  Result Value Ref Range   Glucose-Capillary 110 (H) 70 - 99 mg/dL  I-STAT 7, (LYTES, BLD GAS, ICA, H+H)     Status: Abnormal   Collection Time: 08/11/21 10:01 AM  Result Value Ref Range   pH, Arterial 7.363 7.35 - 7.45   pCO2 arterial 42.4 32 - 48 mmHg   pO2, Arterial 82 (L) 83 - 108 mmHg   Bicarbonate 24.1 20.0 - 28.0 mmol/L   TCO2 25 22 - 32 mmol/L   O2 Saturation 95 %   Acid-base deficit 1.0 0.0 - 2.0 mmol/L   Sodium 157 (H) 135 - 145 mmol/L   Potassium 4.0 3.5 - 5.1 mmol/L   Calcium, Ion 1.27 1.15 - 1.40 mmol/L   HCT 27.0 (L) 39.0 - 52.0 %   Hemoglobin 9.2 (L) 13.0 - 17.0 g/dL   Patient temperature 47.8 C    Sample type ARTERIAL   I-STAT 7, (LYTES, BLD GAS, ICA, H+H)     Status: Abnormal   Collection Time: 08/11/21 11:06 AM  Result Value Ref Range   pH, Arterial 7.367 7.35 - 7.45   pCO2 arterial 45.6 32 - 48 mmHg   pO2, Arterial 54 (L) 83 - 108 mmHg   Bicarbonate 26.1 20.0 - 28.0 mmol/L   TCO2 27 22 - 32 mmol/L   O2 Saturation 86 %   Acid-Base Excess 1.0 0.0 - 2.0 mmol/L   Sodium 158 (H) 135 - 145 mmol/L   Potassium 4.1 3.5 - 5.1 mmol/L   Calcium, Ion 1.27 1.15 - 1.40 mmol/L   HCT 27.0 (L) 39.0 - 52.0 %   Hemoglobin 9.2 (L) 13.0 - 17.0 g/dL   Patient temperature 29.5 C    Sample type ARTERIAL   Glucose, capillary     Status: Abnormal   Collection Time: 08/11/21 11:07 AM  Result Value Ref Range   Glucose-Capillary 131 (H) 70 - 99 mg/dL  I-STAT 7, (LYTES, BLD GAS, ICA, H+H)  Status: Abnormal   Collection Time: 08/11/21  2:02 PM  Result Value Ref Range   pH, Arterial 7.386 7.35 - 7.45   pCO2 arterial 36.5  32 - 48 mmHg   pO2, Arterial 64 (L) 83 - 108 mmHg   Bicarbonate 21.9 20.0 - 28.0 mmol/L   TCO2 23 22 - 32 mmol/L   O2 Saturation 92 %   Acid-base deficit 3.0 (H) 0.0 - 2.0 mmol/L   Sodium 157 (H) 135 - 145 mmol/L   Potassium 3.8 3.5 - 5.1 mmol/L   Calcium, Ion 1.26 1.15 - 1.40 mmol/L   HCT 22.0 (L) 39.0 - 52.0 %   Hemoglobin 7.5 (L) 13.0 - 17.0 g/dL   Patient temperature 40.9 C    Sample type ARTERIAL   CBC     Status: Abnormal   Collection Time: 08/11/21  3:49 PM  Result Value Ref Range   WBC 11.1 (H) 4.0 - 10.5 K/uL   RBC 2.74 (L) 4.22 - 5.81 MIL/uL   Hemoglobin 8.0 (L) 13.0 - 17.0 g/dL   HCT 81.1 (L) 91.4 - 78.2 %   MCV 89.1 80.0 - 100.0 fL   MCH 29.2 26.0 - 34.0 pg   MCHC 32.8 30.0 - 36.0 g/dL   RDW 95.6 (H) 21.3 - 08.6 %   Platelets 126 (L) 150 - 400 K/uL   nRBC 3.9 (H) 0.0 - 0.2 %  Basic metabolic panel     Status: Abnormal   Collection Time: 08/11/21  3:49 PM  Result Value Ref Range   Sodium 153 (H) 135 - 145 mmol/L   Potassium 3.6 3.5 - 5.1 mmol/L   Chloride 127 (H) 98 - 111 mmol/L   CO2 23 22 - 32 mmol/L   Glucose, Bld 122 (H) 70 - 99 mg/dL   BUN 27 (H) 6 - 20 mg/dL   Creatinine, Ser 5.78 0.61 - 1.24 mg/dL   Calcium 8.1 (L) 8.9 - 10.3 mg/dL   GFR, Estimated >46 >96 mL/min   Anion gap 3 (L) 5 - 15  Heparin level (unfractionated)     Status: Abnormal   Collection Time: 08/11/21  3:49 PM  Result Value Ref Range   Heparin Unfractionated <0.10 (L) 0.30 - 0.70 IU/mL  APTT     Status: Abnormal   Collection Time: 08/11/21  3:49 PM  Result Value Ref Range   aPTT 62 (H) 24 - 36 seconds  Lactic acid, plasma     Status: None   Collection Time: 08/11/21  3:49 PM  Result Value Ref Range   Lactic Acid, Venous 1.1 0.5 - 1.9 mmol/L  Phosphorus     Status: Abnormal   Collection Time: 08/11/21  3:49 PM  Result Value Ref Range   Phosphorus 2.3 (L) 2.5 - 4.6 mg/dL  Magnesium     Status: None   Collection Time: 08/11/21  3:49 PM  Result Value Ref Range   Magnesium 2.1 1.7  - 2.4 mg/dL  Glucose, capillary     Status: Abnormal   Collection Time: 08/11/21  3:56 PM  Result Value Ref Range   Glucose-Capillary 119 (H) 70 - 99 mg/dL  I-STAT 7, (LYTES, BLD GAS, ICA, H+H)     Status: Abnormal   Collection Time: 08/11/21  3:59 PM  Result Value Ref Range   pH, Arterial 7.453 (H) 7.35 - 7.45   pCO2 arterial 31.9 (L) 32 - 48 mmHg   pO2, Arterial 95 83 - 108 mmHg   Bicarbonate 22.4 20.0 - 28.0 mmol/L  TCO2 23 22 - 32 mmol/L   O2 Saturation 98 %   Acid-base deficit 1.0 0.0 - 2.0 mmol/L   Sodium 157 (H) 135 - 145 mmol/L   Potassium 3.7 3.5 - 5.1 mmol/L   Calcium, Ion 1.25 1.15 - 1.40 mmol/L   HCT 21.0 (L) 39.0 - 52.0 %   Hemoglobin 7.1 (L) 13.0 - 17.0 g/dL   Patient temperature 11.9 C    Sample type ARTERIAL   I-STAT 7, (LYTES, BLD GAS, ICA, H+H)     Status: Abnormal   Collection Time: 08/11/21  4:32 PM  Result Value Ref Range   pH, Arterial 7.460 (H) 7.35 - 7.45   pCO2 arterial 31.3 (L) 32 - 48 mmHg   pO2, Arterial 103 83 - 108 mmHg   Bicarbonate 22.4 20.0 - 28.0 mmol/L   TCO2 23 22 - 32 mmol/L   O2 Saturation 98 %   Acid-base deficit 1.0 0.0 - 2.0 mmol/L   Sodium 158 (H) 135 - 145 mmol/L   Potassium 3.6 3.5 - 5.1 mmol/L   Calcium, Ion 1.26 1.15 - 1.40 mmol/L   HCT 21.0 (L) 39.0 - 52.0 %   Hemoglobin 7.1 (L) 13.0 - 17.0 g/dL   Patient temperature 14.7 C    Sample type ARTERIAL   I-STAT 7, (LYTES, BLD GAS, ICA, H+H)     Status: Abnormal   Collection Time: 08/11/21  5:16 PM  Result Value Ref Range   pH, Arterial 7.426 7.35 - 7.45   pCO2 arterial 33.5 32 - 48 mmHg   pO2, Arterial 88 83 - 108 mmHg   Bicarbonate 22.1 20.0 - 28.0 mmol/L   TCO2 23 22 - 32 mmol/L   O2 Saturation 97 %   Acid-base deficit 2.0 0.0 - 2.0 mmol/L   Sodium 157 (H) 135 - 145 mmol/L   Potassium 3.5 3.5 - 5.1 mmol/L   Calcium, Ion 1.27 1.15 - 1.40 mmol/L   HCT 21.0 (L) 39.0 - 52.0 %   Hemoglobin 7.1 (L) 13.0 - 17.0 g/dL   Patient temperature 82.9 C    Sample type ARTERIAL    I-STAT 7, (LYTES, BLD GAS, ICA, H+H)     Status: Abnormal   Collection Time: 08/11/21  6:15 PM  Result Value Ref Range   pH, Arterial 7.324 (L) 7.35 - 7.45   pCO2 arterial 42.3 32 - 48 mmHg   pO2, Arterial 71 (L) 83 - 108 mmHg   Bicarbonate 22.0 20.0 - 28.0 mmol/L   TCO2 23 22 - 32 mmol/L   O2 Saturation 93 %   Acid-base deficit 4.0 (H) 0.0 - 2.0 mmol/L   Sodium 157 (H) 135 - 145 mmol/L   Potassium 3.4 (L) 3.5 - 5.1 mmol/L   Calcium, Ion 1.30 1.15 - 1.40 mmol/L   HCT 29.0 (L) 39.0 - 52.0 %   Hemoglobin 9.9 (L) 13.0 - 17.0 g/dL   Patient temperature 56.2 C    Sample type ARTERIAL   I-STAT 7, (LYTES, BLD GAS, ICA, H+H)     Status: Abnormal   Collection Time: 08/11/21  6:55 PM  Result Value Ref Range   pH, Arterial 7.357 7.35 - 7.45   pCO2 arterial 40.5 32 - 48 mmHg   pO2, Arterial 86 83 - 108 mmHg   Bicarbonate 22.7 20.0 - 28.0 mmol/L   TCO2 24 22 - 32 mmol/L   O2 Saturation 96 %   Acid-base deficit 3.0 (H) 0.0 - 2.0 mmol/L   Sodium 158 (H) 135 -  145 mmol/L   Potassium 4.4 3.5 - 5.1 mmol/L   Calcium, Ion 1.29 1.15 - 1.40 mmol/L   HCT 20.0 (L) 39.0 - 52.0 %   Hemoglobin 6.8 (LL) 13.0 - 17.0 g/dL   Patient temperature 29.5 C    Sample type ARTERIAL   Type and screen [facility]     Status: None (Preliminary result)   Collection Time: 08/11/21  9:00 PM  Result Value Ref Range   ABO/RH(D) A POS    Antibody Screen NEG    Sample Expiration 08/14/2021,2359    Unit Number [id number]    Blood Component Type RED CELLS,LR    Unit division 00    Status of Unit ISSUED    Transfusion Status OK TO TRANSFUSE    Crossmatch Result      Compatible Performed at [facility], [address], [place], [place] [address]    Unit Number [id number]    Blood Component Type RED CELLS,LR    Unit division 00    Status of Unit ISSUED    Transfusion Status OK TO TRANSFUSE    Crossmatch Result Compatible    Unit Number [id number]    Blood Component Type RED  CELLS,LR    Unit division 00    Status of Unit ALLOCATED    Transfusion Status OK TO TRANSFUSE    Crossmatch Result Compatible    Unit Number [id number]    Blood Component Type RED CELLS,LR    Unit division 00    Status of Unit ALLOCATED    Transfusion Status OK TO TRANSFUSE    Crossmatch Result Compatible    Unit Number [id number]    Blood Component Type RBC LR PHER1    Unit division 00    Status of Unit ALLOCATED    Transfusion Status OK TO TRANSFUSE    Crossmatch Result Compatible    Unit Number [id number]    Blood Component Type RBC LR PHER2    Unit division 00    Status of Unit ALLOCATED    Transfusion Status OK TO TRANSFUSE    Crossmatch Result Compatible   I-STAT 7, (LYTES, BLD GAS, ICA, H+H)     Status: Abnormal   Collection Time: 08/11/21  9:05 PM  Result Value Ref Range   pH, Arterial 7.353 7.35 - 7.45   pCO2 arterial 43.4 32 - 48 mmHg   pO2, Arterial 75 (L) 83 - 108 mmHg   Bicarbonate 24.2 20.0 - 28.0 mmol/L   TCO2 25 22 - 32 mmol/L   O2 Saturation 94 %   Acid-base deficit 1.0 0.0 - 2.0 mmol/L   Sodium 157 (H) 135 - 145 mmol/L   Potassium 3.9 3.5 - 5.1 mmol/L   Calcium, Ion 1.32 1.15 - 1.40 mmol/L   HCT 20.0 (L) 39.0 - 52.0 %   Hemoglobin 6.8 (LL) 13.0 - 17.0 g/dL   Patient temperature 51.7 C    Sample type ARTERIAL    Comment NOTIFIED PHYSICIAN   Glucose, capillary     Status: Abnormal   Collection Time: 08/11/21  9:09 PM  Result Value Ref Range   Glucose-Capillary 122 (H) 70 - 99 mg/dL  Glucose, capillary     Status: Abnormal   Collection Time: 08/12/21 12:11 AM  Result Value Ref Range   Glucose-Capillary 111 (H) 70 - 99 mg/dL  CBC     Status: Abnormal   Collection Time: 08/12/21  2:01 AM  Result Value Ref Range   WBC 10.6 (H) 4.0 -  10.5 K/uL   RBC 2.70 (L) 4.22 - 5.81 MIL/uL   Hemoglobin 7.9 (L) 13.0 - 17.0 g/dL   HCT 94.5 (L) 03.8 - 88.2 %   MCV 91.9 80.0 - 100.0 fL   MCH 29.3 26.0 - 34.0 pg   MCHC 31.9 30.0 - 36.0 g/dL   RDW 80.0 (H)  34.9 - 15.5 %   Platelets 123 (L) 150 - 400 K/uL   nRBC 2.8 (H) 0.0 - 0.2 %  Basic metabolic panel     Status: Abnormal   Collection Time: 08/12/21  2:01 AM  Result Value Ref Range   Sodium 154 (H) 135 - 145 mmol/L   Potassium 3.8 3.5 - 5.1 mmol/L   Chloride 123 (H) 98 - 111 mmol/L   CO2 23 22 - 32 mmol/L   Glucose, Bld 135 (H) 70 - 99 mg/dL   BUN 27 (H) 6 - 20 mg/dL   Creatinine, Ser 1.79 0.61 - 1.24 mg/dL   Calcium 8.2 (L) 8.9 - 10.3 mg/dL   GFR, Estimated >15 >05 mL/min   Anion gap 8 5 - 15  Lactate dehydrogenase     Status: Abnormal   Collection Time: 08/12/21  2:01 AM  Result Value Ref Range   LDH 498 (H) 98 - 192 U/L  Protime-INR     Status: Abnormal   Collection Time: 08/12/21  2:01 AM  Result Value Ref Range   Prothrombin Time 15.8 (H) 11.4 - 15.2 seconds   INR 1.3 (H) 0.8 - 1.2  Fibrinogen     Status: None   Collection Time: 08/12/21  2:01 AM  Result Value Ref Range   Fibrinogen 453 210 - 475 mg/dL  Hepatic function panel     Status: Abnormal   Collection Time: 08/12/21  2:01 AM  Result Value Ref Range   Total Protein 4.6 (L) 6.5 - 8.1 g/dL   Albumin 2.5 (L) 3.5 - 5.0 g/dL   AST 57 (H) 15 - 41 U/L   ALT 54 (H) 0 - 44 U/L   Alkaline Phosphatase 130 (H) 38 - 126 U/L   Total Bilirubin 1.5 (H) 0.3 - 1.2 mg/dL   Bilirubin, Direct 0.4 (H) 0.0 - 0.2 mg/dL   Indirect Bilirubin 1.1 (H) 0.3 - 0.9 mg/dL  Heparin level (unfractionated)     Status: Abnormal   Collection Time: 08/12/21  2:01 AM  Result Value Ref Range   Heparin Unfractionated <0.10 (L) 0.30 - 0.70 IU/mL  APTT     Status: Abnormal   Collection Time: 08/12/21  2:01 AM  Result Value Ref Range   aPTT 57 (H) 24 - 36 seconds  Lactic acid, plasma     Status: None   Collection Time: 08/12/21  2:01 AM  Result Value Ref Range   Lactic Acid, Venous 1.1 0.5 - 1.9 mmol/L  Triglycerides     Status: Abnormal   Collection Time: 08/12/21  2:01 AM  Result Value Ref Range   Triglycerides 383 (H) <150 mg/dL  I-STAT 7,  (LYTES, BLD GAS, ICA, H+H)     Status: Abnormal   Collection Time: 08/12/21  2:02 AM  Result Value Ref Range   pH, Arterial 7.375 7.35 - 7.45   pCO2 arterial 40.9 32 - 48 mmHg   pO2, Arterial 79 (L) 83 - 108 mmHg   Bicarbonate 23.9 20.0 - 28.0 mmol/L   TCO2 25 22 - 32 mmol/L   O2 Saturation 95 %   Acid-base deficit 1.0 0.0 - 2.0 mmol/L  Sodium 158 (H) 135 - 145 mmol/L   Potassium 3.9 3.5 - 5.1 mmol/L   Calcium, Ion 1.29 1.15 - 1.40 mmol/L   HCT 20.0 (L) 39.0 - 52.0 %   Hemoglobin 6.8 (LL) 13.0 - 17.0 g/dL   Patient temperature 16.1 C    Sample type ARTERIAL    Comment NOTIFIED PHYSICIAN   Glucose, capillary     Status: Abnormal   Collection Time: 08/12/21  4:05 AM  Result Value Ref Range   Glucose-Capillary 108 (H) 70 - 99 mg/dL  Prepare RBC (crossmatch)     Status: None   Collection Time: 08/12/21  7:00 AM  Result Value Ref Range   Order Confirmation      ORDER PROCESSED BY BLOOD BANK Performed at [facility], [address] [place], [place] [address]     Assessment & Plan: Present on Admission:  Trauma of chest  ARDS (adult respiratory distress syndrome) ([name])  Contusion of left lung  Acute on chronic respiratory failure with hypoxia and hypercapnia ([name])  Critical polytrauma  TBI (traumatic brain injury) ([name])    LOS: 7 days   Additional comments:I reviewed the patient's new clinical lab test results.  Discussed at multidisciplinary ECMO rounds. 82M [facility]   SDH, IVH, SAH - NSGY c/s, Dr. Maurice Small, repeat CT head 7/12 some IVH but fairly stable, okay for above normal sodiums and for heparin as long as no bolus dosing. Head CT 7/16 with edema, but improvement in IVH. No midline shift. L rib fx 1-8 with L HPTX - L CT on suction, keep at -40 Extensive pulmonary consolidation with refractory hypoxemia - cannulated for VV-ECMO 7/11, metrics improved some, better flow Suspected aspiration - continue empiric meropenem/vanc Fracture dislocation L elbow/olecranon and  ulnar styloid with comminuted fracture of the left midshaft and distal humerus - initial eval by Dr. Linna Caprice, splinted, definitive care per Dr. Derinda Sis Trauma, OR 7/13 with Dr. Jena Gauss I&D, CR L humerus.  Further plan is pending per the orthopedic trauma service. Comminuted fracture dislocation R hip - reduced in TB, KI in place, traction pin 7/13 by Dr. Jena Gauss.  Plan ORIF in the OR by Dr. Carola Frost this morning after tracheostomy. Complex lacerations RUE/RLE with degloving - operative washout and repair 7/13 by Dr. Jena Gauss, vac to RLE, proximity to R hip makes fixation high risk. TO OR for washout 7/18 with Dr. Carola Frost and possible ORIF R comminuted first metacarpal fracture/open - irrigated and splinted by Dr. Linna Caprice, hand surgery notified by EDP Coagulopathy - platelets up to 125 ID - continue meropenem (d6) and vanc (d4), NGTD on resp cx from bronch 7/15 Acute blood loss anemia -2 units PRBCs now on route to the OR FEN - TF held for OR DVT - SCDs, heparin gtt stopped at 7 AM, heparin levels remain very low Dispo - ICU, I will proceed with tracheostomy first.  Consent was obtained yesterday from the patient's wife, Iline Oven Medina-Franco after the team discussed the procedure, risks, and benefits.  Critical Care Total Time*: 32 Minutes  Violeta Gelinas, MD, MPH, FACS Trauma & General Surgery Use [email or web address] to contact on call provider  08/12/2021  *Care during the described time interval was provided by me. I have reviewed this patient's available data, including medical history, events of note, physical examination and test results as part of my evaluation.

## 2021-08-12 NOTE — Progress Notes (Signed)
1245 Wife called for updates.
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
## 2021-08-12 NOTE — Progress Notes (Signed)
Overnight ES Note: Per orders from Dr. Janee Morn, transfuse RBC if hemoglobin drops below 8 and stop heparin gtt. CBC at 2259 resulted hemoglobin 7.4. Wound vac consistently putting out 75-123mL/hr of bright red blood. Janina Mayo has minimal old dark red drainage, CT has minimal drainage. Notified Dr. Gala Romney, okay to transfuse, stop heparin gtt with plans to restart at 0600. Primary RN notified Dr. Janee Morn and pharmacy.   Addendum: AM hgb resulted 7.7. Dr. Gala Romney ordered to hold heparin gtt for now and administer RBC.
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
## 2021-08-12 NOTE — Progress Notes (Signed)
The risks and benefits of surgery were discussed with the patient's family yesterday, including the possibility of infection, nerve injury, vessel injury, wound breakdown, arthritis, symptomatic hardware, DVT/ PE, loss of motion, malunion, nonunion, and need for further surgery among others.  This also specifically included the need to stage surgery because of the elevated risk of soft tissue breakdown that could lead to amputation of the right leg. After acknowledgement of these risks, consent was provided to proceed.  Planning for ORIF of right acetabulum if at all feasible today; possible ORIF of left humerus and olecranon though unlikely.  Myrene Galas, MD [facility], [facility] [phone]

## 2021-08-12 NOTE — Transfer of Care (Signed)
Immediate Anesthesia Transfer of Care Note  Patient: Jeff [name]  Procedure(s) Performed: Debridment of Right Lateral Thigh, Biologic Graft Placement (40 x 20cm), Wound Vac Exchange, Insertion of traction (Right: Thigh) TRACHEOSTOMY  Patient Location: ICU  Anesthesia Type:General  Level of Consciousness: drowsy, patient cooperative and Patient remains intubated per anesthesia plan  Airway & Oxygen Therapy: Patient remains intubated per anesthesia plan and Patient placed on Ventilator (see vital sign flow sheet for setting)  Post-op Assessment: Report given to RN and Post -op Vital signs reviewed and stable  Post vital signs: Reviewed and stable  Last Vitals:  Vitals Value Taken Time  BP    Temp    Pulse    Resp 16 08/12/21 1142  SpO2    Vitals shown include unvalidated device data.  Last Pain:  Vitals:   08/11/21 1600  TempSrc: Bladder  PainSc:          Complications: No notable events documented.
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
## 2021-08-13 ENCOUNTER — Encounter (HOSPITAL_COMMUNITY): Payer: Self-pay | Admitting: Orthopedic Surgery

## 2021-08-13 ENCOUNTER — Inpatient Hospital Stay (HOSPITAL_COMMUNITY): Payer: Medicaid Other

## 2021-08-13 DIAGNOSIS — J80 Acute respiratory distress syndrome: Secondary | ICD-10-CM | POA: Diagnosis not present

## 2021-08-13 DIAGNOSIS — J9601 Acute respiratory failure with hypoxia: Secondary | ICD-10-CM

## 2021-08-13 DIAGNOSIS — S299XXA Unspecified injury of thorax, initial encounter: Secondary | ICD-10-CM | POA: Diagnosis not present

## 2021-08-13 DIAGNOSIS — D62 Acute posthemorrhagic anemia: Secondary | ICD-10-CM | POA: Diagnosis not present

## 2021-08-13 DIAGNOSIS — J9602 Acute respiratory failure with hypercapnia: Secondary | ICD-10-CM

## 2021-08-13 DIAGNOSIS — T07XXXA Unspecified multiple injuries, initial encounter: Secondary | ICD-10-CM | POA: Diagnosis not present

## 2021-08-13 DIAGNOSIS — J9621 Acute and chronic respiratory failure with hypoxia: Secondary | ICD-10-CM | POA: Diagnosis not present

## 2021-08-13 DIAGNOSIS — Z7189 Other specified counseling: Secondary | ICD-10-CM | POA: Diagnosis not present

## 2021-08-13 LAB — GLUCOSE, CAPILLARY
Glucose-Capillary: 164 mg/dL — ABNORMAL HIGH (ref 70–99)
Glucose-Capillary: 194 mg/dL — ABNORMAL HIGH (ref 70–99)
Glucose-Capillary: 210 mg/dL — ABNORMAL HIGH (ref 70–99)
Glucose-Capillary: 225 mg/dL — ABNORMAL HIGH (ref 70–99)
Glucose-Capillary: 231 mg/dL — ABNORMAL HIGH (ref 70–99)
Glucose-Capillary: 236 mg/dL — ABNORMAL HIGH (ref 70–99)

## 2021-08-13 LAB — CBC WITH DIFFERENTIAL/PLATELET
Abs Immature Granulocytes: 1.2 10*3/uL — ABNORMAL HIGH (ref 0.00–0.07)
Basophils Absolute: 0.1 10*3/uL (ref 0.0–0.1)
Basophils Relative: 0 %
Eosinophils Absolute: 0.4 10*3/uL (ref 0.0–0.5)
Eosinophils Relative: 3 %
HCT: 22.8 % — ABNORMAL LOW (ref 39.0–52.0)
Hemoglobin: 7.7 g/dL — ABNORMAL LOW (ref 13.0–17.0)
Immature Granulocytes: 8 %
Lymphocytes Relative: 16 %
Lymphs Abs: 2.3 10*3/uL (ref 0.7–4.0)
MCH: 29.6 pg (ref 26.0–34.0)
MCHC: 33.8 g/dL (ref 30.0–36.0)
MCV: 87.7 fL (ref 80.0–100.0)
Monocytes Absolute: 1.1 10*3/uL — ABNORMAL HIGH (ref 0.1–1.0)
Monocytes Relative: 7 %
Neutro Abs: 9.7 10*3/uL — ABNORMAL HIGH (ref 1.7–7.7)
Neutrophils Relative %: 66 %
Platelets: 112 10*3/uL — ABNORMAL LOW (ref 150–400)
RBC: 2.6 MIL/uL — ABNORMAL LOW (ref 4.22–5.81)
RDW: 18.1 % — ABNORMAL HIGH (ref 11.5–15.5)
Smear Review: NORMAL
WBC: 14.7 10*3/uL — ABNORMAL HIGH (ref 4.0–10.5)
nRBC: 2.8 % — ABNORMAL HIGH (ref 0.0–0.2)

## 2021-08-13 LAB — POCT I-STAT 7, (LYTES, BLD GAS, ICA,H+H)
Acid-Base Excess: 5 mmol/L — ABNORMAL HIGH (ref 0.0–2.0)
Acid-base deficit: 1 mmol/L (ref 0.0–2.0)
Acid-base deficit: 1 mmol/L (ref 0.0–2.0)
Acid-base deficit: 2 mmol/L (ref 0.0–2.0)
Acid-base deficit: 3 mmol/L — ABNORMAL HIGH (ref 0.0–2.0)
Acid-base deficit: 2 mmol/L (ref 0.0–2.0)
Acid-base deficit: 2 mmol/L (ref 0.0–2.0)
Bicarbonate: 23.2 mmol/L (ref 20.0–28.0)
Bicarbonate: 23.1 mmol/L (ref 20.0–28.0)
Bicarbonate: 22.6 mmol/L (ref 20.0–28.0)
Bicarbonate: 29.2 mmol/L — ABNORMAL HIGH (ref 20.0–28.0)
Bicarbonate: 22.3 mmol/L (ref 20.0–28.0)
Bicarbonate: 23.6 mmol/L (ref 20.0–28.0)
Bicarbonate: 23.8 mmol/L (ref 20.0–28.0)
Calcium, Ion: 1.26 mmol/L (ref 1.15–1.40)
Calcium, Ion: 1.27 mmol/L (ref 1.15–1.40)
Calcium, Ion: 1.25 mmol/L (ref 1.15–1.40)
Calcium, Ion: 1.27 mmol/L (ref 1.15–1.40)
Calcium, Ion: 1.26 mmol/L (ref 1.15–1.40)
Calcium, Ion: 1.25 mmol/L (ref 1.15–1.40)
Calcium, Ion: 1.25 mmol/L (ref 1.15–1.40)
HCT: 20 % — ABNORMAL LOW (ref 39.0–52.0)
HCT: 19 % — ABNORMAL LOW (ref 39.0–52.0)
HCT: 18 % — ABNORMAL LOW (ref 39.0–52.0)
HCT: 19 % — ABNORMAL LOW (ref 39.0–52.0)
HCT: 21 % — ABNORMAL LOW (ref 39.0–52.0)
HCT: 21 % — ABNORMAL LOW (ref 39.0–52.0)
HCT: 24 % — ABNORMAL LOW (ref 39.0–52.0)
Hemoglobin: 6.8 g/dL — CL (ref 13.0–17.0)
Hemoglobin: 6.5 g/dL — CL (ref 13.0–17.0)
Hemoglobin: 6.1 g/dL — CL (ref 13.0–17.0)
Hemoglobin: 6.5 g/dL — CL (ref 13.0–17.0)
Hemoglobin: 7.1 g/dL — ABNORMAL LOW (ref 13.0–17.0)
Hemoglobin: 7.1 g/dL — ABNORMAL LOW (ref 13.0–17.0)
Hemoglobin: 8.2 g/dL — ABNORMAL LOW (ref 13.0–17.0)
O2 Saturation: 100 %
O2 Saturation: 100 %
O2 Saturation: 100 %
O2 Saturation: 99 %
O2 Saturation: 98 %
O2 Saturation: 99 %
O2 Saturation: 97 %
Patient temperature: 36.9
Patient temperature: 36.9
Patient temperature: 36.8
Patient temperature: 36.8
Patient temperature: 36.7
Patient temperature: 36.8
Patient temperature: 36.7
Potassium: 4.4 mmol/L (ref 3.5–5.1)
Potassium: 4.2 mmol/L (ref 3.5–5.1)
Potassium: 4.4 mmol/L (ref 3.5–5.1)
Potassium: 4.5 mmol/L (ref 3.5–5.1)
Potassium: 4.8 mmol/L (ref 3.5–5.1)
Potassium: 4.5 mmol/L (ref 3.5–5.1)
Potassium: 4.5 mmol/L (ref 3.5–5.1)
Sodium: 154 mmol/L — ABNORMAL HIGH (ref 135–145)
Sodium: 153 mmol/L — ABNORMAL HIGH (ref 135–145)
Sodium: 152 mmol/L — ABNORMAL HIGH (ref 135–145)
Sodium: 152 mmol/L — ABNORMAL HIGH (ref 135–145)
Sodium: 151 mmol/L — ABNORMAL HIGH (ref 135–145)
Sodium: 152 mmol/L — ABNORMAL HIGH (ref 135–145)
Sodium: 153 mmol/L — ABNORMAL HIGH (ref 135–145)
TCO2: 24 mmol/L (ref 22–32)
TCO2: 24 mmol/L (ref 22–32)
TCO2: 24 mmol/L (ref 22–32)
TCO2: 30 mmol/L (ref 22–32)
TCO2: 24 mmol/L (ref 22–32)
TCO2: 25 mmol/L (ref 22–32)
TCO2: 25 mmol/L (ref 22–32)
pCO2 arterial: 36.2 mmHg (ref 32–48)
pCO2 arterial: 36.4 mmHg (ref 32–48)
pCO2 arterial: 38 mmHg (ref 32–48)
pCO2 arterial: 38.2 mmHg (ref 32–48)
pCO2 arterial: 39.9 mmHg (ref 32–48)
pCO2 arterial: 42.7 mmHg (ref 32–48)
pCO2 arterial: 42.8 mmHg (ref 32–48)
pH, Arterial: 7.416 (ref 7.35–7.45)
pH, Arterial: 7.41 (ref 7.35–7.45)
pH, Arterial: 7.382 (ref 7.35–7.45)
pH, Arterial: 7.49 — ABNORMAL HIGH (ref 7.35–7.45)
pH, Arterial: 7.354 (ref 7.35–7.45)
pH, Arterial: 7.351 (ref 7.35–7.45)
pH, Arterial: 7.352 (ref 7.35–7.45)
pO2, Arterial: 231 mmHg — ABNORMAL HIGH (ref 83–108)
pO2, Arterial: 200 mmHg — ABNORMAL HIGH (ref 83–108)
pO2, Arterial: 162 mmHg — ABNORMAL HIGH (ref 83–108)
pO2, Arterial: 162 mmHg — ABNORMAL HIGH (ref 83–108)
pO2, Arterial: 111 mmHg — ABNORMAL HIGH (ref 83–108)
pO2, Arterial: 126 mmHg — ABNORMAL HIGH (ref 83–108)
pO2, Arterial: 97 mmHg (ref 83–108)

## 2021-08-13 LAB — BASIC METABOLIC PANEL
Anion gap: 5 (ref 5–15)
Anion gap: 3 — ABNORMAL LOW (ref 5–15)
BUN: 35 mg/dL — ABNORMAL HIGH (ref 6–20)
BUN: 37 mg/dL — ABNORMAL HIGH (ref 6–20)
CO2: 21 mmol/L — ABNORMAL LOW (ref 22–32)
CO2: 23 mmol/L (ref 22–32)
Calcium: 7.6 mg/dL — ABNORMAL LOW (ref 8.9–10.3)
Calcium: 7.6 mg/dL — ABNORMAL LOW (ref 8.9–10.3)
Chloride: 125 mmol/L — ABNORMAL HIGH (ref 98–111)
Chloride: 125 mmol/L — ABNORMAL HIGH (ref 98–111)
Creatinine, Ser: 0.86 mg/dL (ref 0.61–1.24)
Creatinine, Ser: 0.86 mg/dL (ref 0.61–1.24)
GFR, Estimated: >60 mL/min (ref >60)
GFR, Estimated: >60 mL/min (ref >60)
Glucose, Bld: 175 mg/dL — ABNORMAL HIGH (ref 70–99)
Glucose, Bld: 237 mg/dL — ABNORMAL HIGH (ref 70–99)
Potassium: 4.3 mmol/L (ref 3.5–5.1)
Potassium: 4.6 mmol/L (ref 3.5–5.1)
Sodium: 151 mmol/L — ABNORMAL HIGH (ref 135–145)
Sodium: 151 mmol/L — ABNORMAL HIGH (ref 135–145)

## 2021-08-13 LAB — HEPATIC FUNCTION PANEL
ALT: 37 U/L (ref 0–44)
AST: 33 U/L (ref 15–41)
Albumin: 2 g/dL — ABNORMAL LOW (ref 3.5–5.0)
Alkaline Phosphatase: 95 U/L (ref 38–126)
Bilirubin, Direct: 0.3 mg/dL — ABNORMAL HIGH (ref 0.0–0.2)
Indirect Bilirubin: 0.8 mg/dL (ref 0.3–0.9)
Total Bilirubin: 1.1 mg/dL (ref 0.3–1.2)
Total Protein: 3.9 g/dL — ABNORMAL LOW (ref 6.5–8.1)

## 2021-08-13 LAB — CBC
HCT: 24.3 % — ABNORMAL LOW (ref 39.0–52.0)
HCT: 26.6 % — ABNORMAL LOW (ref 39.0–52.0)
Hemoglobin: 8.1 g/dL — ABNORMAL LOW (ref 13.0–17.0)
Hemoglobin: 8.8 g/dL — ABNORMAL LOW (ref 13.0–17.0)
MCH: 29.1 pg (ref 26.0–34.0)
MCH: 28.9 pg (ref 26.0–34.0)
MCHC: 33.3 g/dL (ref 30.0–36.0)
MCHC: 33.1 g/dL (ref 30.0–36.0)
MCV: 87.4 fL (ref 80.0–100.0)
MCV: 87.5 fL (ref 80.0–100.0)
Platelets: 107 10*3/uL — ABNORMAL LOW (ref 150–400)
Platelets: 104 10*3/uL — ABNORMAL LOW (ref 150–400)
RBC: 2.78 MIL/uL — ABNORMAL LOW (ref 4.22–5.81)
RBC: 3.04 MIL/uL — ABNORMAL LOW (ref 4.22–5.81)
RDW: 17.9 % — ABNORMAL HIGH (ref 11.5–15.5)
RDW: 18.2 % — ABNORMAL HIGH (ref 11.5–15.5)
WBC: 14.5 10*3/uL — ABNORMAL HIGH (ref 4.0–10.5)
WBC: 19.1 10*3/uL — ABNORMAL HIGH (ref 4.0–10.5)
nRBC: 2.5 % — ABNORMAL HIGH (ref 0.0–0.2)
nRBC: 3.3 % — ABNORMAL HIGH (ref 0.0–0.2)

## 2021-08-13 LAB — PREPARE RBC (CROSSMATCH)

## 2021-08-13 LAB — APTT
aPTT: 38 seconds — ABNORMAL HIGH (ref 24–36)
aPTT: 38 seconds — ABNORMAL HIGH (ref 24–36)

## 2021-08-13 LAB — VANCOMYCIN, TROUGH: Vancomycin Tr: <4 ug/mL — ABNORMAL LOW (ref 15–20)

## 2021-08-13 LAB — PROTIME-INR
INR: 1.3 — ABNORMAL HIGH (ref 0.8–1.2)
Prothrombin Time: 15.8 seconds — ABNORMAL HIGH (ref 11.4–15.2)

## 2021-08-13 LAB — HEMOGLOBIN AND HEMATOCRIT, BLOOD
HCT: 23.8 % — ABNORMAL LOW (ref 39.0–52.0)
HCT: 26.8 % — ABNORMAL LOW (ref 39.0–52.0)
Hemoglobin: 7.9 g/dL — ABNORMAL LOW (ref 13.0–17.0)
Hemoglobin: 8.8 g/dL — ABNORMAL LOW (ref 13.0–17.0)

## 2021-08-13 LAB — LACTIC ACID, PLASMA: Lactic Acid, Venous: 1.3 mmol/L (ref 0.5–1.9)

## 2021-08-13 LAB — FIBRINOGEN: Fibrinogen: 317 mg/dL (ref 210–475)

## 2021-08-13 LAB — PATHOLOGIST SMEAR REVIEW

## 2021-08-13 LAB — TRIGLYCERIDES: Triglycerides: 377 mg/dL — ABNORMAL HIGH (ref <150)

## 2021-08-13 LAB — LACTATE DEHYDROGENASE: LDH: 472 U/L — ABNORMAL HIGH (ref 98–192)

## 2021-08-13 MED ORDER — POLYETHYLENE GLYCOL 3350 17 G PO PACK: 17.0000 g | PACK | Freq: Every day | ORAL | Status: DC | PRN

## 2021-08-13 MED ORDER — ATROPINE SULFATE 1 MG/10ML IJ SOSY
PREFILLED_SYRINGE | INTRAMUSCULAR | Status: AC
  Filled 2021-08-13: qty 10

## 2021-08-13 MED ORDER — DOCUSATE SODIUM 50 MG/5ML PO LIQD: 100.0000 mg | Freq: Two times a day (BID) | ORAL | Status: DC | PRN

## 2021-08-13 MED ORDER — SODIUM CHLORIDE 0.9% IV SOLUTION: Freq: Once | INTRAVENOUS | Status: AC

## 2021-08-13 MED ORDER — TRANEXAMIC ACID-NACL 1000-0.7 MG/100ML-% IV SOLN
1000.0000 mg | Freq: Once | INTRAVENOUS | Status: AC
  Administered 2021-08-13: 1000 mg via INTRAVENOUS
  Filled 2021-08-13: qty 100

## 2021-08-13 MED ORDER — FUROSEMIDE 10 MG/ML IJ SOLN
20.0000 mg | Freq: Once | INTRAMUSCULAR | Status: AC
Start: 2021-08-13 — End: 2021-08-13
  Administered 2021-08-13: 20 mg via INTRAVENOUS
  Filled 2021-08-13: qty 2

## 2021-08-13 MED ORDER — VANCOMYCIN HCL 1500 MG/300ML IV SOLN
1500.0000 mg | Freq: Two times a day (BID) | INTRAVENOUS | Status: DC
Start: 2021-08-13 — End: 2021-08-15
  Administered 2021-08-13 – 2021-08-15 (×4): 1500 mg via INTRAVENOUS
  Filled 2021-08-13 (×4): qty 300
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
## 2021-08-13 NOTE — Progress Notes (Signed)
Plan for ORIF LUEx tomorrow am. Will attempt to see if supine may work on fluoro upon entry to the room but anticipate lateral with left side up.  Myrene Galas, MD [facility], [facility] [place] [phone]
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
## 2021-08-14 ENCOUNTER — Encounter (HOSPITAL_COMMUNITY)
Admission: EM | Disposition: A | Discharge status: Other Acute Inpatient Hospital | Payer: Self-pay | Source: Home / Self Care

## 2021-08-14 ENCOUNTER — Inpatient Hospital Stay (HOSPITAL_COMMUNITY): Payer: Medicaid Other | Admitting: Anesthesiology

## 2021-08-14 ENCOUNTER — Other Ambulatory Visit: Payer: Self-pay

## 2021-08-14 ENCOUNTER — Inpatient Hospital Stay (HOSPITAL_COMMUNITY): Payer: Medicaid Other

## 2021-08-14 DIAGNOSIS — S42302A Unspecified fracture of shaft of humerus, left arm, initial encounter for closed fracture: Secondary | ICD-10-CM | POA: Diagnosis not present

## 2021-08-14 DIAGNOSIS — J9602 Acute respiratory failure with hypercapnia: Secondary | ICD-10-CM

## 2021-08-14 DIAGNOSIS — D62 Acute posthemorrhagic anemia: Secondary | ICD-10-CM

## 2021-08-14 DIAGNOSIS — N289 Disorder of kidney and ureter, unspecified: Secondary | ICD-10-CM | POA: Diagnosis not present

## 2021-08-14 DIAGNOSIS — J9601 Acute respiratory failure with hypoxia: Secondary | ICD-10-CM | POA: Diagnosis not present

## 2021-08-14 DIAGNOSIS — S27321A Contusion of lung, unilateral, initial encounter: Secondary | ICD-10-CM | POA: Diagnosis not present

## 2021-08-14 DIAGNOSIS — D649 Anemia, unspecified: Secondary | ICD-10-CM

## 2021-08-14 DIAGNOSIS — S52022A Displaced fracture of olecranon process without intraarticular extension of left ulna, initial encounter for closed fracture: Secondary | ICD-10-CM | POA: Diagnosis not present

## 2021-08-14 DIAGNOSIS — S299XXA Unspecified injury of thorax, initial encounter: Secondary | ICD-10-CM | POA: Diagnosis not present

## 2021-08-14 DIAGNOSIS — J80 Acute respiratory distress syndrome: Secondary | ICD-10-CM | POA: Diagnosis not present

## 2021-08-14 HISTORY — PX: ORIF HUMERUS FRACTURE: SHX2126

## 2021-08-14 HISTORY — PX: ORIF ELBOW FRACTURE: SHX5031

## 2021-08-14 LAB — POCT I-STAT 7, (LYTES, BLD GAS, ICA,H+H)
Acid-base deficit: 1 mmol/L (ref 0.0–2.0)
Acid-base deficit: 2 mmol/L (ref 0.0–2.0)
Acid-base deficit: 4 mmol/L — ABNORMAL HIGH (ref 0.0–2.0)
Acid-base deficit: 3 mmol/L — ABNORMAL HIGH (ref 0.0–2.0)
Acid-base deficit: 2 mmol/L (ref 0.0–2.0)
Acid-base deficit: 4 mmol/L — ABNORMAL HIGH (ref 0.0–2.0)
Acid-base deficit: 3 mmol/L — ABNORMAL HIGH (ref 0.0–2.0)
Bicarbonate: 23.1 mmol/L (ref 20.0–28.0)
Bicarbonate: 22.7 mmol/L (ref 20.0–28.0)
Bicarbonate: 21.6 mmol/L (ref 20.0–28.0)
Bicarbonate: 22.7 mmol/L (ref 20.0–28.0)
Bicarbonate: 22.2 mmol/L (ref 20.0–28.0)
Bicarbonate: 21.7 mmol/L (ref 20.0–28.0)
Bicarbonate: 22.1 mmol/L (ref 20.0–28.0)
Calcium, Ion: 1.26 mmol/L (ref 1.15–1.40)
Calcium, Ion: 1.27 mmol/L (ref 1.15–1.40)
Calcium, Ion: 1.28 mmol/L (ref 1.15–1.40)
Calcium, Ion: 1.23 mmol/L (ref 1.15–1.40)
Calcium, Ion: 1.24 mmol/L (ref 1.15–1.40)
Calcium, Ion: 1.22 mmol/L (ref 1.15–1.40)
Calcium, Ion: 1.23 mmol/L (ref 1.15–1.40)
HCT: 23 % — ABNORMAL LOW (ref 39.0–52.0)
HCT: 21 % — ABNORMAL LOW (ref 39.0–52.0)
HCT: 22 % — ABNORMAL LOW (ref 39.0–52.0)
HCT: 24 % — ABNORMAL LOW (ref 39.0–52.0)
HCT: 22 % — ABNORMAL LOW (ref 39.0–52.0)
HCT: 21 % — ABNORMAL LOW (ref 39.0–52.0)
HCT: 19 % — ABNORMAL LOW (ref 39.0–52.0)
Hemoglobin: 7.8 g/dL — ABNORMAL LOW (ref 13.0–17.0)
Hemoglobin: 7.1 g/dL — ABNORMAL LOW (ref 13.0–17.0)
Hemoglobin: 7.5 g/dL — ABNORMAL LOW (ref 13.0–17.0)
Hemoglobin: 8.2 g/dL — ABNORMAL LOW (ref 13.0–17.0)
Hemoglobin: 7.5 g/dL — ABNORMAL LOW (ref 13.0–17.0)
Hemoglobin: 7.1 g/dL — ABNORMAL LOW (ref 13.0–17.0)
Hemoglobin: 6.5 g/dL — CL (ref 13.0–17.0)
O2 Saturation: 99 %
O2 Saturation: 100 %
O2 Saturation: 99 %
O2 Saturation: 100 %
O2 Saturation: 100 %
O2 Saturation: 99 %
O2 Saturation: 99 %
Patient temperature: 36.7
Patient temperature: 36.3
Patient temperature: 36.8
Patient temperature: 36.8
Patient temperature: 36.8
Patient temperature: 36.8
Patient temperature: 36.9
Potassium: 4.2 mmol/L (ref 3.5–5.1)
Potassium: 4.5 mmol/L (ref 3.5–5.1)
Potassium: 4.7 mmol/L (ref 3.5–5.1)
Potassium: 4.6 mmol/L (ref 3.5–5.1)
Potassium: 4.5 mmol/L (ref 3.5–5.1)
Potassium: 4.5 mmol/L (ref 3.5–5.1)
Potassium: 4.4 mmol/L (ref 3.5–5.1)
Sodium: 153 mmol/L — ABNORMAL HIGH (ref 135–145)
Sodium: 152 mmol/L — ABNORMAL HIGH (ref 135–145)
Sodium: 152 mmol/L — ABNORMAL HIGH (ref 135–145)
Sodium: 152 mmol/L — ABNORMAL HIGH (ref 135–145)
Sodium: 150 mmol/L — ABNORMAL HIGH (ref 135–145)
Sodium: 151 mmol/L — ABNORMAL HIGH (ref 135–145)
Sodium: 150 mmol/L — ABNORMAL HIGH (ref 135–145)
TCO2: 24 mmol/L (ref 22–32)
TCO2: 24 mmol/L (ref 22–32)
TCO2: 23 mmol/L (ref 22–32)
TCO2: 24 mmol/L (ref 22–32)
TCO2: 23 mmol/L (ref 22–32)
TCO2: 23 mmol/L (ref 22–32)
TCO2: 23 mmol/L (ref 22–32)
pCO2 arterial: 36.8 mmHg (ref 32–48)
pCO2 arterial: 33.4 mmHg (ref 32–48)
pCO2 arterial: 42 mmHg (ref 32–48)
pCO2 arterial: 45.7 mmHg (ref 32–48)
pCO2 arterial: 34.9 mmHg (ref 32–48)
pCO2 arterial: 39.4 mmHg (ref 32–48)
pCO2 arterial: 40.2 mmHg (ref 32–48)
pH, Arterial: 7.405 (ref 7.35–7.45)
pH, Arterial: 7.436 (ref 7.35–7.45)
pH, Arterial: 7.32 — ABNORMAL LOW (ref 7.35–7.45)
pH, Arterial: 7.304 — ABNORMAL LOW (ref 7.35–7.45)
pH, Arterial: 7.411 (ref 7.35–7.45)
pH, Arterial: 7.348 — ABNORMAL LOW (ref 7.35–7.45)
pH, Arterial: 7.348 — ABNORMAL LOW (ref 7.35–7.45)
pO2, Arterial: 135 mmHg — ABNORMAL HIGH (ref 83–108)
pO2, Arterial: 227 mmHg — ABNORMAL HIGH (ref 83–108)
pO2, Arterial: 175 mmHg — ABNORMAL HIGH (ref 83–108)
pO2, Arterial: 203 mmHg — ABNORMAL HIGH (ref 83–108)
pO2, Arterial: 170 mmHg — ABNORMAL HIGH (ref 83–108)
pO2, Arterial: 161 mmHg — ABNORMAL HIGH (ref 83–108)
pO2, Arterial: 142 mmHg — ABNORMAL HIGH (ref 83–108)

## 2021-08-14 LAB — APTT
aPTT: 37 seconds — ABNORMAL HIGH (ref 24–36)
aPTT: 35 seconds (ref 24–36)

## 2021-08-14 LAB — CBC
HCT: 25.5 % — ABNORMAL LOW (ref 39.0–52.0)
HCT: 20.5 % — ABNORMAL LOW (ref 39.0–52.0)
HCT: 22.6 % — ABNORMAL LOW (ref 39.0–52.0)
Hemoglobin: 8.7 g/dL — ABNORMAL LOW (ref 13.0–17.0)
Hemoglobin: 6.9 g/dL — CL (ref 13.0–17.0)
Hemoglobin: 7.5 g/dL — ABNORMAL LOW (ref 13.0–17.0)
MCH: 29.2 pg (ref 26.0–34.0)
MCH: 29.5 pg (ref 26.0–34.0)
MCH: 29.2 pg (ref 26.0–34.0)
MCHC: 34.1 g/dL (ref 30.0–36.0)
MCHC: 33.7 g/dL (ref 30.0–36.0)
MCHC: 33.2 g/dL (ref 30.0–36.0)
MCV: 85.6 fL (ref 80.0–100.0)
MCV: 87.6 fL (ref 80.0–100.0)
MCV: 87.9 fL (ref 80.0–100.0)
Platelets: 119 10*3/uL — ABNORMAL LOW (ref 150–400)
Platelets: 116 10*3/uL — ABNORMAL LOW (ref 150–400)
Platelets: 121 10*3/uL — ABNORMAL LOW (ref 150–400)
RBC: 2.98 MIL/uL — ABNORMAL LOW (ref 4.22–5.81)
RBC: 2.34 MIL/uL — ABNORMAL LOW (ref 4.22–5.81)
RBC: 2.57 MIL/uL — ABNORMAL LOW (ref 4.22–5.81)
RDW: 17.9 % — ABNORMAL HIGH (ref 11.5–15.5)
RDW: 18.4 % — ABNORMAL HIGH (ref 11.5–15.5)
RDW: 17.2 % — ABNORMAL HIGH (ref 11.5–15.5)
WBC: 20.6 10*3/uL — ABNORMAL HIGH (ref 4.0–10.5)
WBC: 22.9 10*3/uL — ABNORMAL HIGH (ref 4.0–10.5)
WBC: 22.2 10*3/uL — ABNORMAL HIGH (ref 4.0–10.5)
nRBC: 3.9 % — ABNORMAL HIGH (ref 0.0–0.2)
nRBC: 3.2 % — ABNORMAL HIGH (ref 0.0–0.2)
nRBC: 5.2 % — ABNORMAL HIGH (ref 0.0–0.2)

## 2021-08-14 LAB — HEPATIC FUNCTION PANEL
ALT: 32 U/L (ref 0–44)
AST: 34 U/L (ref 15–41)
Albumin: 1.9 g/dL — ABNORMAL LOW (ref 3.5–5.0)
Alkaline Phosphatase: 87 U/L (ref 38–126)
Bilirubin, Direct: 0.3 mg/dL — ABNORMAL HIGH (ref 0.0–0.2)
Indirect Bilirubin: 0.9 mg/dL (ref 0.3–0.9)
Total Bilirubin: 1.2 mg/dL (ref 0.3–1.2)
Total Protein: 4.4 g/dL — ABNORMAL LOW (ref 6.5–8.1)

## 2021-08-14 LAB — BASIC METABOLIC PANEL
Anion gap: 4 — ABNORMAL LOW (ref 5–15)
Anion gap: 4 — ABNORMAL LOW (ref 5–15)
BUN: 39 mg/dL — ABNORMAL HIGH (ref 6–20)
BUN: 46 mg/dL — ABNORMAL HIGH (ref 6–20)
CO2: 22 mmol/L (ref 22–32)
CO2: 22 mmol/L (ref 22–32)
Calcium: 7.8 mg/dL — ABNORMAL LOW (ref 8.9–10.3)
Calcium: 7.6 mg/dL — ABNORMAL LOW (ref 8.9–10.3)
Chloride: 125 mmol/L — ABNORMAL HIGH (ref 98–111)
Chloride: 126 mmol/L — ABNORMAL HIGH (ref 98–111)
Creatinine, Ser: 0.72 mg/dL (ref 0.61–1.24)
Creatinine, Ser: 0.92 mg/dL (ref 0.61–1.24)
GFR, Estimated: >60 mL/min (ref >60)
GFR, Estimated: >60 mL/min (ref >60)
Glucose, Bld: 174 mg/dL — ABNORMAL HIGH (ref 70–99)
Glucose, Bld: 224 mg/dL — ABNORMAL HIGH (ref 70–99)
Potassium: 4.2 mmol/L (ref 3.5–5.1)
Potassium: 4.6 mmol/L (ref 3.5–5.1)
Sodium: 151 mmol/L — ABNORMAL HIGH (ref 135–145)
Sodium: 152 mmol/L — ABNORMAL HIGH (ref 135–145)

## 2021-08-14 LAB — FIBRINOGEN: Fibrinogen: 412 mg/dL (ref 210–475)

## 2021-08-14 LAB — LACTATE DEHYDROGENASE: LDH: 613 U/L — ABNORMAL HIGH (ref 98–192)

## 2021-08-14 LAB — GLUCOSE, CAPILLARY
Glucose-Capillary: 163 mg/dL — ABNORMAL HIGH (ref 70–99)
Glucose-Capillary: 140 mg/dL — ABNORMAL HIGH (ref 70–99)
Glucose-Capillary: 147 mg/dL — ABNORMAL HIGH (ref 70–99)
Glucose-Capillary: 201 mg/dL — ABNORMAL HIGH (ref 70–99)
Glucose-Capillary: 261 mg/dL — ABNORMAL HIGH (ref 70–99)

## 2021-08-14 LAB — PROTIME-INR
INR: 1.3 — ABNORMAL HIGH (ref 0.8–1.2)
Prothrombin Time: 15.8 seconds — ABNORMAL HIGH (ref 11.4–15.2)

## 2021-08-14 LAB — LACTIC ACID, PLASMA: Lactic Acid, Venous: 1.1 mmol/L (ref 0.5–1.9)

## 2021-08-14 LAB — HEPARIN LEVEL (UNFRACTIONATED): Heparin Unfractionated: <0.1 IU/mL — ABNORMAL LOW (ref 0.30–0.70)

## 2021-08-14 LAB — MAGNESIUM: Magnesium: 2.4 mg/dL (ref 1.7–2.4)

## 2021-08-14 LAB — PREPARE RBC (CROSSMATCH)

## 2021-08-14 SURGERY — OPEN REDUCTION INTERNAL FIXATION (ORIF) DISTAL HUMERUS FRACTURE
Anesthesia: General | Site: Elbow | Laterality: Left

## 2021-08-14 MED ORDER — ROCURONIUM BROMIDE 10 MG/ML (PF) SYRINGE
PREFILLED_SYRINGE | INTRAVENOUS | Status: DC | PRN
  Administered 2021-08-14 (×2): 100 mg via INTRAVENOUS

## 2021-08-14 MED ORDER — HEPARIN (PORCINE) 25000 UT/250ML-% IV SOLN
1000.0000 [IU]/h | INTRAVENOUS | Status: DC
  Administered 2021-08-14: 600 [IU]/h via INTRAVENOUS
  Administered 2021-08-16: 900 [IU]/h via INTRAVENOUS
  Administered 2021-08-17: 1000 [IU]/h via INTRAVENOUS
  Filled 2021-08-14 (×3): qty 250

## 2021-08-14 MED ORDER — MIDAZOLAM HCL 2 MG/2ML IJ SOLN
INTRAMUSCULAR | Status: AC
  Filled 2021-08-14: qty 2

## 2021-08-14 MED ORDER — INSULIN DETEMIR 100 UNIT/ML ~~LOC~~ SOLN
5.0000 [IU] | Freq: Every day | SUBCUTANEOUS | Status: DC
  Administered 2021-08-14 – 2021-08-15 (×2): 5 [IU] via SUBCUTANEOUS
  Filled 2021-08-14 (×3): qty 0.05

## 2021-08-14 MED ORDER — PROSOURCE TF PO LIQD
45.0000 mL | Freq: Four times a day (QID) | ORAL | Status: DC
  Administered 2021-08-14 – 2021-09-02 (×75): 45 mL
  Filled 2021-08-14 (×76): qty 45

## 2021-08-14 MED ORDER — CLONAZEPAM 1 MG PO TABS
3.0000 mg | ORAL_TABLET | Freq: Two times a day (BID) | ORAL | Status: DC
  Administered 2021-08-14 – 2021-08-17 (×7): 3 mg
  Filled 2021-08-14 (×6): qty 3

## 2021-08-14 MED ORDER — SODIUM CHLORIDE 0.9 % IV SOLN: INTRAVENOUS | Status: DC | PRN

## 2021-08-14 MED ORDER — SODIUM CHLORIDE 0.9% IV SOLUTION: Freq: Once | INTRAVENOUS | Status: AC

## 2021-08-14 MED ORDER — PANTOPRAZOLE SODIUM 40 MG IV SOLR
40.0000 mg | Freq: Two times a day (BID) | INTRAVENOUS | Status: DC
  Administered 2021-08-14 – 2021-08-23 (×16): 40 mg via INTRAVENOUS
  Filled 2021-08-14 (×16): qty 10

## 2021-08-14 MED ORDER — FENTANYL CITRATE (PF) 250 MCG/5ML IJ SOLN
INTRAMUSCULAR | Status: AC
  Filled 2021-08-14: qty 5

## 2021-08-14 MED ORDER — PIVOT 1.5 CAL PO LIQD
1000.0000 mL | ORAL | Status: DC
  Administered 2021-08-14: 1000 mL

## 2021-08-14 MED ORDER — 0.9 % SODIUM CHLORIDE (POUR BTL) OPTIME
TOPICAL | Status: DC | PRN
  Administered 2021-08-14 (×2): 1000 mL

## 2021-08-14 MED ORDER — PIVOT 1.5 CAL PO LIQD
1000.0000 mL | ORAL | Status: DC
  Administered 2021-08-15 – 2021-08-18 (×7): 1000 mL

## 2021-08-14 MED ORDER — ESMOLOL HCL 100 MG/10ML IV SOLN
INTRAVENOUS | Status: DC | PRN
  Administered 2021-08-14: 30 mg via INTRAVENOUS
  Administered 2021-08-14 (×2): 50 mg via INTRAVENOUS

## 2021-08-14 MED ORDER — ZINC SULFATE 220 (50 ZN) MG PO CAPS
220.0000 mg | ORAL_CAPSULE | Freq: Every day | ORAL | Status: DC
Start: 2021-08-14 — End: 2021-08-21
  Administered 2021-08-14 – 2021-08-21 (×8): 220 mg
  Filled 2021-08-14 (×7): qty 1

## 2021-08-14 MED ORDER — FENTANYL CITRATE (PF) 250 MCG/5ML IJ SOLN
INTRAMUSCULAR | Status: DC | PRN
  Administered 2021-08-14: 150 ug via INTRAVENOUS
  Administered 2021-08-14: 100 ug via INTRAVENOUS

## 2021-08-14 MED ORDER — INSULIN ASPART 100 UNIT/ML IJ SOLN
2.0000 [IU] | INTRAMUSCULAR | Status: DC
  Administered 2021-08-14 – 2021-08-15 (×4): 6 [IU] via SUBCUTANEOUS

## 2021-08-14 MED ORDER — DEXMEDETOMIDINE HCL IN NACL 400 MCG/100ML IV SOLN
0.0000 ug/kg/h | INTRAVENOUS | Status: DC
  Administered 2021-08-14: 0.7 ug/kg/h via INTRAVENOUS
  Administered 2021-08-15: 0.6 ug/kg/h via INTRAVENOUS
  Administered 2021-08-15: 0.7 ug/kg/h via INTRAVENOUS
  Administered 2021-08-15: 0.8 ug/kg/h via INTRAVENOUS
  Administered 2021-08-15: 0.4 ug/kg/h via INTRAVENOUS
  Administered 2021-08-16: 0.8 ug/kg/h via INTRAVENOUS
  Administered 2021-08-16: 0.5 ug/kg/h via INTRAVENOUS
  Administered 2021-08-16 (×2): 0.7 ug/kg/h via INTRAVENOUS
  Administered 2021-08-17: 0.6 ug/kg/h via INTRAVENOUS
  Administered 2021-08-17: 0.8 ug/kg/h via INTRAVENOUS
  Administered 2021-08-17: 0.7 ug/kg/h via INTRAVENOUS
  Filled 2021-08-14 (×2): qty 100
  Filled 2021-08-14: qty 200
  Filled 2021-08-14 (×8): qty 100

## 2021-08-14 MED ORDER — ASCORBIC ACID 500 MG PO TABS
500.0000 mg | ORAL_TABLET | Freq: Two times a day (BID) | ORAL | Status: DC
Start: 2021-08-14 — End: 2021-09-15
  Administered 2021-08-14 – 2021-09-15 (×63): 500 mg
  Filled 2021-08-14 (×62): qty 1

## 2021-08-14 MED ORDER — PANTOPRAZOLE SODIUM 40 MG PO TBEC
40.0000 mg | DELAYED_RELEASE_TABLET | Freq: Two times a day (BID) | ORAL | Status: DC
  Administered 2021-08-18 – 2021-08-20 (×3): 40 mg via ORAL
  Filled 2021-08-14 (×5): qty 1

## 2021-08-14 MED ORDER — FUROSEMIDE 10 MG/ML IJ SOLN
20.0000 mg | Freq: Once | INTRAMUSCULAR | Status: AC
Start: 2021-08-14 — End: 2021-08-14
  Administered 2021-08-14: 20 mg via INTRAVENOUS
  Filled 2021-08-14: qty 2

## 2021-08-14 SURGICAL SUPPLY — 126 items
ANCH SUT 1 SHRT SM RGD INSRTR (Anchor) ×2 IMPLANT
ANCHOR SUT 1.45 SZ 1 SHORT (Anchor) ×1 IMPLANT
APL SKNCLS STERI-STRIP NONHPOA (GAUZE/BANDAGES/DRESSINGS)
BAG COUNTER SPONGE SURGICOUNT (BAG) ×3 IMPLANT
BAG SPNG CNTER NS LX DISP (BAG) ×2
BENZOIN TINCTURE PRP APPL 2/3 (GAUZE/BANDAGES/DRESSINGS) IMPLANT
BIT DRILL 2.3 120MM (DRILL) IMPLANT
BIT DRILL QC 1.8X125 (BIT) ×1 IMPLANT
BIT DRILL QC 2.4X100 (BIT) ×1 IMPLANT
BIT DRILL QC 2.5X135 (BIT) ×1 IMPLANT
BIT DRILL QC 2.8X135 (BIT) ×1 IMPLANT
BLADE AVERAGE 25X9 (BLADE) ×2 IMPLANT
BLADE CLIPPER SURG (BLADE) ×3 IMPLANT
BLADE SURG 10 STRL SS (BLADE) IMPLANT
BNDG CMPR 5X3 CHSV STRCH STRL (GAUZE/BANDAGES/DRESSINGS) ×2
BNDG CMPR 9X4 STRL LF SNTH (GAUZE/BANDAGES/DRESSINGS)
BNDG COHESIVE 3X5 TAN ST LF (GAUZE/BANDAGES/DRESSINGS) ×1 IMPLANT
BNDG COHESIVE 4X5 TAN STRL (GAUZE/BANDAGES/DRESSINGS) ×3 IMPLANT
BNDG ELASTIC 3X5.8 VLCR STR LF (GAUZE/BANDAGES/DRESSINGS) ×2 IMPLANT
BNDG ELASTIC 4X5.8 VLCR STR LF (GAUZE/BANDAGES/DRESSINGS) ×4 IMPLANT
BNDG ELASTIC 6X5.8 VLCR STR LF (GAUZE/BANDAGES/DRESSINGS) ×2 IMPLANT
BNDG ESMARK 4X9 LF (GAUZE/BANDAGES/DRESSINGS) ×2 IMPLANT
BNDG GAUZE DERMACEA FLUFF (GAUZE/BANDAGES/DRESSINGS) ×3
BNDG GAUZE DERMACEA FLUFF 4 (GAUZE/BANDAGES/DRESSINGS) IMPLANT
BNDG GAUZE ELAST 4 BULKY (GAUZE/BANDAGES/DRESSINGS) ×4 IMPLANT
BNDG GZE DERMACEA 4 6PLY (GAUZE/BANDAGES/DRESSINGS) ×2
BRUSH SCRUB EZ PLAIN DRY (MISCELLANEOUS) ×6 IMPLANT
CLEANER TIP ELECTROSURG 2X2 (MISCELLANEOUS) ×3 IMPLANT
CORD BIPOLAR FORCEPS 12FT (ELECTRODE) ×1 IMPLANT
COVER SURGICAL LIGHT HANDLE (MISCELLANEOUS) ×6 IMPLANT
CUFF TOURN SGL QUICK 18X4 (TOURNIQUET CUFF) IMPLANT
CUFF TOURN SGL QUICK 24 (TOURNIQUET CUFF)
CUFF TRNQT CYL 24X4X16.5-23 (TOURNIQUET CUFF) IMPLANT
DRAIN PENROSE 1/4X12 LTX STRL (WOUND CARE) IMPLANT
DRAPE C-ARM 42X72 X-RAY (DRAPES) ×3 IMPLANT
DRAPE C-ARMOR (DRAPES) ×3 IMPLANT
DRAPE HALF SHEET 40X57 (DRAPES) IMPLANT
DRAPE INCISE IOBAN 66X45 STRL (DRAPES) ×1 IMPLANT
DRAPE ORTHO SPLIT 77X108 STRL (DRAPES) ×3
DRAPE SURG ORHT 6 SPLT 77X108 (DRAPES) ×2 IMPLANT
DRAPE U-SHAPE 47X51 STRL (DRAPES) ×6 IMPLANT
DRILL 2.3 120MM AKA DRILL 2.3L (DRILL) ×3
DRSG ADAPTIC 3X8 NADH LF (GAUZE/BANDAGES/DRESSINGS) ×2 IMPLANT
DRSG EMULSION OIL 3X3 NADH (GAUZE/BANDAGES/DRESSINGS) IMPLANT
DRSG MEPITEL 4X7.2 (GAUZE/BANDAGES/DRESSINGS) ×3 IMPLANT
DRSG PAD ABDOMINAL 8X10 ST (GAUZE/BANDAGES/DRESSINGS) ×3 IMPLANT
ELECT REM PT RETURN 9FT ADLT (ELECTROSURGICAL) ×3
ELECTRODE REM PT RTRN 9FT ADLT (ELECTROSURGICAL) ×2 IMPLANT
EVACUATOR 1/8 PVC DRAIN (DRAIN) IMPLANT
FACESHIELD WRAPAROUND (MASK) IMPLANT
FACESHIELD WRAPAROUND OR TEAM (MASK) IMPLANT
GAUZE SPONGE 4X4 12PLY STRL (GAUZE/BANDAGES/DRESSINGS) ×5 IMPLANT
GAUZE XEROFORM 1X8 LF (GAUZE/BANDAGES/DRESSINGS) ×2 IMPLANT
GAUZE XEROFORM 5X9 LF (GAUZE/BANDAGES/DRESSINGS) ×2 IMPLANT
GLOVE BIO SURGEON STRL SZ7.5 (GLOVE) ×3 IMPLANT
GLOVE BIO SURGEON STRL SZ8 (GLOVE) ×5 IMPLANT
GLOVE BIOGEL PI IND STRL 7.5 (GLOVE) ×2 IMPLANT
GLOVE BIOGEL PI IND STRL 8 (GLOVE) ×2 IMPLANT
GLOVE BIOGEL PI INDICATOR 7.5 (GLOVE) ×3
GLOVE BIOGEL PI INDICATOR 8 (GLOVE) ×3
GLOVE SURG ORTHO LTX SZ7.5 (GLOVE) ×6 IMPLANT
GOWN STRL REUS W/ TWL LRG LVL3 (GOWN DISPOSABLE) ×4 IMPLANT
GOWN STRL REUS W/ TWL XL LVL3 (GOWN DISPOSABLE) ×4 IMPLANT
GOWN STRL REUS W/TWL LRG LVL3 (GOWN DISPOSABLE) ×6
GOWN STRL REUS W/TWL XL LVL3 (GOWN DISPOSABLE) ×6
K-WIRE FIXATION .9X100 (WIRE) ×6
KIT BASIN OR (CUSTOM PROCEDURE TRAY) ×3 IMPLANT
KIT INFUSE LRG II (Orthopedic Implant) ×1 IMPLANT
KIT TURNOVER KIT B (KITS) ×3 IMPLANT
KWIRE FIXATION .9X100 (WIRE) IMPLANT
MANIFOLD NEPTUNE II (INSTRUMENTS) ×2 IMPLANT
NDL HYPO 25X1 1.5 SAFETY (NEEDLE) IMPLANT
NEEDLE HYPO 25X1 1.5 SAFETY (NEEDLE) IMPLANT
NS IRRIG 1000ML POUR BTL (IV SOLUTION) ×3 IMPLANT
PACK ORTHO EXTREMITY (CUSTOM PROCEDURE TRAY) ×3 IMPLANT
PAD ARMBOARD 7.5X6 YLW CONV (MISCELLANEOUS) ×6 IMPLANT
PAD CAST 4YDX4 CTTN HI CHSV (CAST SUPPLIES) ×2 IMPLANT
PADDING CAST COTTON 4X4 STRL (CAST SUPPLIES) ×6
PLATE LOCK DCP 3.5X202 15H (Plate) ×1 IMPLANT
PLATE OLECR 8H HOOK (Plate) ×1 IMPLANT
SCREW CORT 3.2X18 HEX (Screw) ×1 IMPLANT
SCREW CORT HEX 3.2X34 (Screw) ×1 IMPLANT
SCREW CORTEX 2.4 18MM (Screw) ×1 IMPLANT
SCREW CORTEX 2.4 22MM (Screw) ×3 IMPLANT
SCREW CORTEX 2.4 24MM (Screw) ×1 IMPLANT
SCREW CORTEX 3.5 20MM (Screw) ×1 IMPLANT
SCREW CORTEX 3.5 22MM (Screw) ×1 IMPLANT
SCREW CORTEX 3.5 28MM (Screw) ×1 IMPLANT
SCREW CORTEX 3.5 30MM (Screw) ×2 IMPLANT
SCREW HEX 3.2X14 (Screw) ×1 IMPLANT
SCREW LOCK CORT ST 3.5X20 (Screw) IMPLANT
SCREW LOCK CORT ST 3.5X22 (Screw) IMPLANT
SCREW LOCK CORT ST 3.5X28 (Screw) IMPLANT
SCREW LOCK CORT ST 3.5X30 (Screw) IMPLANT
SCREW LOCK T15 FT 22X3.5XST (Screw) IMPLANT
SCREW LOCK T15 FT 24X3.5X2.9X (Screw) IMPLANT
SCREW LOCK T15 FT 30X3.5X2.9X (Screw) IMPLANT
SCREW LOCKING 3.2X16 (Screw) ×1 IMPLANT
SCREW LOCKING 3.5X22 (Screw) ×3 IMPLANT
SCREW LOCKING 3.5X24 (Screw) ×3 IMPLANT
SCREW LOCKING 3.5X30 (Screw) ×3 IMPLANT
SCREW LOCKING CORTICAL 3.2X18 (Screw) ×1 IMPLANT
SPIKE FLUID TRANSFER (MISCELLANEOUS) IMPLANT
SPONGE T-LAP 18X18 ~~LOC~~+RFID (SPONGE) ×6 IMPLANT
STAPLER VISISTAT 35W (STAPLE) ×3 IMPLANT
STOCKINETTE IMPERVIOUS 9X36 MD (GAUZE/BANDAGES/DRESSINGS) ×1 IMPLANT
STRIP CLOSURE SKIN 1/2X4 (GAUZE/BANDAGES/DRESSINGS) IMPLANT
SUCTION FRAZIER HANDLE 10FR (MISCELLANEOUS) ×3
SUCTION TUBE FRAZIER 10FR DISP (MISCELLANEOUS) ×2 IMPLANT
SUT ETHILON 2 0 FS 18 (SUTURE) ×8 IMPLANT
SUT PDS AB 2-0 CT1 27 (SUTURE) ×2 IMPLANT
SUT PROLENE 3 0 PS 2 (SUTURE) ×4 IMPLANT
SUT VIC AB 0 CT1 27 (SUTURE) ×3
SUT VIC AB 0 CT1 27XBRD ANBCTR (SUTURE) ×4 IMPLANT
SUT VIC AB 2-0 CT1 27 (SUTURE) ×6
SUT VIC AB 2-0 CT1 TAPERPNT 27 (SUTURE) ×4 IMPLANT
SUT VIC AB 2-0 CT3 27 (SUTURE) IMPLANT
SYR 5ML LL (SYRINGE) IMPLANT
SYR CONTROL 10ML LL (SYRINGE) ×3 IMPLANT
TOWEL GREEN STERILE (TOWEL DISPOSABLE) ×8 IMPLANT
TOWEL GREEN STERILE FF (TOWEL DISPOSABLE) ×4 IMPLANT
TRAY FOLEY MTR SLVR 16FR STAT (SET/KITS/TRAYS/PACK) IMPLANT
TUBE CONNECTING 12X1/4 (SUCTIONS) ×3 IMPLANT
UNDERPAD 30X36 HEAVY ABSORB (UNDERPADS AND DIAPERS) ×3 IMPLANT
WATER STERILE IRR 1000ML POUR (IV SOLUTION) ×3 IMPLANT
YANKAUER SUCT BULB TIP NO VENT (SUCTIONS) ×3 IMPLANT
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
## 2021-08-14 NOTE — Transfer of Care (Signed)
Immediate Anesthesia Transfer of Care Note  Patient: Jeff [name]  Procedure(s) Performed: OPEN REDUCTION INTERNAL FIXATION (ORIF) DISTAL HUMERUS FRACTURE (Left) OPEN REDUCTION INTERNAL FIXATION (ORIF) ELBOW/OLECRANON FRACTURE (Left: Elbow)  Patient Location: ICU  Anesthesia Type:General  Level of Consciousness: Patient remains intubated per anesthesia plan  Airway & Oxygen Therapy: Patient remains intubated per anesthesia plan and Patient placed on Ventilator (see vital sign flow sheet for setting)  Post-op Assessment: Report given to RN  Post vital signs: Reviewed and stable  Last Vitals:  Vitals Value Taken Time  BP    Temp 36.3 C 08/14/21 1332  Pulse 236 08/14/21 1325  Resp 15 08/14/21 1332  SpO2 100 % 08/14/21 1325  Vitals shown include unvalidated device data.  Last Pain:  Vitals:   08/14/21 0400  TempSrc: Bladder  PainSc:          Complications: No notable events documented.

## 2021-08-14 NOTE — Progress Notes (Signed)
RT assisted with patient transport on vent from OR back to 2H03 without complications.
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
## 2021-08-14 NOTE — Anesthesia Postprocedure Evaluation (Signed)
Anesthesia Post Note  Patient: Jeff [name]  Procedure(s) Performed: OPEN REDUCTION INTERNAL FIXATION (ORIF) DISTAL HUMERUS FRACTURE (Left) OPEN REDUCTION INTERNAL FIXATION (ORIF) ELBOW/OLECRANON FRACTURE (Left: Elbow)     Patient location during evaluation: PACU Anesthesia Type: General Level of consciousness: awake and alert Pain management: pain level controlled Vital Signs Assessment: post-procedure vital signs reviewed and stable Respiratory status: spontaneous breathing, nonlabored ventilation, respiratory function stable and patient connected to nasal cannula oxygen Cardiovascular status: blood pressure returned to baseline and stable Postop Assessment: no apparent nausea or vomiting Anesthetic complications: no   No notable events documented.  Last Vitals:  Vitals:   08/14/21 2015 08/14/21 2030  BP:    Pulse: 90 90  Resp: 20 20  Temp: 36.8 C 36.8 C  SpO2: 100% 100%    Last Pain:  Vitals:   08/14/21 2000  TempSrc: Bladder  PainSc:                  Nelle Don Yaritzel Stange

## 2021-08-14 NOTE — Progress Notes (Signed)
RT assisted with patient transport on vent from 2H03 to OR. Report given to CRNA. Vent attached to power and oxygen attached to wall outlet.

## 2021-08-14 NOTE — Inpatient Diabetes Management (Addendum)
Inpatient Diabetes Program Recommendations  AACE/ADA: New Consensus Statement on Inpatient Glycemic Control (2015)  Target Ranges:  Prepandial:   less than 140 mg/dL      Peak postprandial:   less than 180 mg/dL (1-2 hours)      Critically ill patients:  140 - 180 mg/dL   Lab Results  Component Value Date   GLUCAP 140 (H) 08/14/2021    Review of Glycemic Control  Latest Reference Range & Units 08/13/21 07:51 08/13/21 12:01 08/13/21 17:09 08/13/21 19:50 08/13/21 23:58 08/14/21 03:54 08/14/21 08:08  Glucose-Capillary 70 - 99 mg/dL 761 (H) 950 (H) 932 (H) 231 (H) 236 (H) 163 (H) 140 (H)   Current orders for Inpatient glycemic control:  Novolog 1-3 units Q4 hours  Pivot Tube Feeds  Inpatient Diabetes Program Recommendations:    NPO for procedure this am. Glucose trends elevated while Tube Feeds infusing.  -   Consider adding Novolog 2-3 units Q4 hours Tube Feed Coverage once resumed.  Thanks,  Christena Deem RN, MSN, BC-ADM Inpatient Diabetes Coordinator Team Pager [phone] (8a-5p)

## 2021-08-14 NOTE — Progress Notes (Signed)
ABG    Component Value Date/Time   PHART 7.304 (L) 08/14/2021 1540   PCO2ART 45.7 08/14/2021 1540   PO2ART 203 (H) 08/14/2021 1540   HCO3 22.7 08/14/2021 1540   TCO2 24 08/14/2021 1540   ACIDBASEDEF 3.0 (H) 08/14/2021 1540   O2SAT 100 08/14/2021 1540     Vent adjustments made: PC 14, PEEP 12, 40%. Getting Vt 540cc (which is his 8cc/kg IBW), sometimes higher volumes. Switched to [facility] 540/20/12/40% with Pplat still 24-25.  Working towards sweep trials.  Steffanie Dunn, DO 08/14/21 4:16 PM [facility]

## 2021-08-15 ENCOUNTER — Inpatient Hospital Stay (HOSPITAL_COMMUNITY): Payer: Medicaid Other

## 2021-08-15 ENCOUNTER — Encounter (HOSPITAL_COMMUNITY): Payer: Self-pay | Admitting: Orthopedic Surgery

## 2021-08-15 DIAGNOSIS — J80 Acute respiratory distress syndrome: Secondary | ICD-10-CM | POA: Diagnosis not present

## 2021-08-15 DIAGNOSIS — S299XXA Unspecified injury of thorax, initial encounter: Secondary | ICD-10-CM | POA: Diagnosis not present

## 2021-08-15 LAB — POCT I-STAT 7, (LYTES, BLD GAS, ICA,H+H)
Acid-base deficit: 3 mmol/L — ABNORMAL HIGH (ref 0.0–2.0)
Acid-base deficit: 3 mmol/L — ABNORMAL HIGH (ref 0.0–2.0)
Acid-base deficit: 3 mmol/L — ABNORMAL HIGH (ref 0.0–2.0)
Acid-base deficit: 2 mmol/L (ref 0.0–2.0)
Acid-base deficit: 2 mmol/L (ref 0.0–2.0)
Acid-base deficit: 4 mmol/L — ABNORMAL HIGH (ref 0.0–2.0)
Acid-base deficit: 1 mmol/L (ref 0.0–2.0)
Acid-base deficit: 1 mmol/L (ref 0.0–2.0)
Bicarbonate: 23 mmol/L (ref 20.0–28.0)
Bicarbonate: 22.5 mmol/L (ref 20.0–28.0)
Bicarbonate: 22.8 mmol/L (ref 20.0–28.0)
Bicarbonate: 23 mmol/L (ref 20.0–28.0)
Bicarbonate: 23.3 mmol/L (ref 20.0–28.0)
Bicarbonate: 21.8 mmol/L (ref 20.0–28.0)
Bicarbonate: 24.3 mmol/L (ref 20.0–28.0)
Bicarbonate: 24.4 mmol/L (ref 20.0–28.0)
Calcium, Ion: 1.23 mmol/L (ref 1.15–1.40)
Calcium, Ion: 1.25 mmol/L (ref 1.15–1.40)
Calcium, Ion: 1.25 mmol/L (ref 1.15–1.40)
Calcium, Ion: 1.25 mmol/L (ref 1.15–1.40)
Calcium, Ion: 1.26 mmol/L (ref 1.15–1.40)
Calcium, Ion: 1.27 mmol/L (ref 1.15–1.40)
Calcium, Ion: 1.27 mmol/L (ref 1.15–1.40)
Calcium, Ion: 1.24 mmol/L (ref 1.15–1.40)
HCT: 17 % — ABNORMAL LOW (ref 39.0–52.0)
HCT: 21 % — ABNORMAL LOW (ref 39.0–52.0)
HCT: 19 % — ABNORMAL LOW (ref 39.0–52.0)
HCT: 20 % — ABNORMAL LOW (ref 39.0–52.0)
HCT: 20 % — ABNORMAL LOW (ref 39.0–52.0)
HCT: 20 % — ABNORMAL LOW (ref 39.0–52.0)
HCT: 19 % — ABNORMAL LOW (ref 39.0–52.0)
HCT: 19 % — ABNORMAL LOW (ref 39.0–52.0)
Hemoglobin: 5.8 g/dL — CL (ref 13.0–17.0)
Hemoglobin: 7.1 g/dL — ABNORMAL LOW (ref 13.0–17.0)
Hemoglobin: 6.5 g/dL — CL (ref 13.0–17.0)
Hemoglobin: 6.8 g/dL — CL (ref 13.0–17.0)
Hemoglobin: 6.8 g/dL — CL (ref 13.0–17.0)
Hemoglobin: 6.8 g/dL — CL (ref 13.0–17.0)
Hemoglobin: 6.5 g/dL — CL (ref 13.0–17.0)
Hemoglobin: 6.5 g/dL — CL (ref 13.0–17.0)
O2 Saturation: 99 %
O2 Saturation: 98 %
O2 Saturation: 99 %
O2 Saturation: 99 %
O2 Saturation: 98 %
O2 Saturation: 98 %
O2 Saturation: 99 %
O2 Saturation: 98 %
Patient temperature: 36.9
Patient temperature: 37
Patient temperature: 37
Patient temperature: 36.8
Patient temperature: 36.8
Patient temperature: 36.8
Patient temperature: 36.6
Patient temperature: 36.9
Potassium: 4.4 mmol/L (ref 3.5–5.1)
Potassium: 4.6 mmol/L (ref 3.5–5.1)
Potassium: 4.2 mmol/L (ref 3.5–5.1)
Potassium: 4.4 mmol/L (ref 3.5–5.1)
Potassium: 4.4 mmol/L (ref 3.5–5.1)
Potassium: 4.5 mmol/L (ref 3.5–5.1)
Potassium: 4.4 mmol/L (ref 3.5–5.1)
Potassium: 4.3 mmol/L (ref 3.5–5.1)
Sodium: 149 mmol/L — ABNORMAL HIGH (ref 135–145)
Sodium: 150 mmol/L — ABNORMAL HIGH (ref 135–145)
Sodium: 150 mmol/L — ABNORMAL HIGH (ref 135–145)
Sodium: 151 mmol/L — ABNORMAL HIGH (ref 135–145)
Sodium: 151 mmol/L — ABNORMAL HIGH (ref 135–145)
Sodium: 152 mmol/L — ABNORMAL HIGH (ref 135–145)
Sodium: 152 mmol/L — ABNORMAL HIGH (ref 135–145)
Sodium: 152 mmol/L — ABNORMAL HIGH (ref 135–145)
TCO2: 24 mmol/L (ref 22–32)
TCO2: 24 mmol/L (ref 22–32)
TCO2: 24 mmol/L (ref 22–32)
TCO2: 24 mmol/L (ref 22–32)
TCO2: 25 mmol/L (ref 22–32)
TCO2: 23 mmol/L (ref 22–32)
TCO2: 26 mmol/L (ref 22–32)
TCO2: 26 mmol/L (ref 22–32)
pCO2 arterial: 42.8 mmHg (ref 32–48)
pCO2 arterial: 41.6 mmHg (ref 32–48)
pCO2 arterial: 42.3 mmHg (ref 32–48)
pCO2 arterial: 38.8 mmHg (ref 32–48)
pCO2 arterial: 41 mmHg (ref 32–48)
pCO2 arterial: 42.9 mmHg (ref 32–48)
pCO2 arterial: 41.1 mmHg (ref 32–48)
pCO2 arterial: 45.9 mmHg (ref 32–48)
pH, Arterial: 7.338 — ABNORMAL LOW (ref 7.35–7.45)
pH, Arterial: 7.341 — ABNORMAL LOW (ref 7.35–7.45)
pH, Arterial: 7.339 — ABNORMAL LOW (ref 7.35–7.45)
pH, Arterial: 7.38 (ref 7.35–7.45)
pH, Arterial: 7.362 (ref 7.35–7.45)
pH, Arterial: 7.313 — ABNORMAL LOW (ref 7.35–7.45)
pH, Arterial: 7.378 (ref 7.35–7.45)
pH, Arterial: 7.333 — ABNORMAL LOW (ref 7.35–7.45)
pO2, Arterial: 154 mmHg — ABNORMAL HIGH (ref 83–108)
pO2, Arterial: 118 mmHg — ABNORMAL HIGH (ref 83–108)
pO2, Arterial: 140 mmHg — ABNORMAL HIGH (ref 83–108)
pO2, Arterial: 150 mmHg — ABNORMAL HIGH (ref 83–108)
pO2, Arterial: 115 mmHg — ABNORMAL HIGH (ref 83–108)
pO2, Arterial: 110 mmHg — ABNORMAL HIGH (ref 83–108)
pO2, Arterial: 134 mmHg — ABNORMAL HIGH (ref 83–108)
pO2, Arterial: 112 mmHg — ABNORMAL HIGH (ref 83–108)

## 2021-08-15 LAB — TYPE AND SCREEN
ABO/RH(D): A POS
Antibody Screen: NEGATIVE
Unit division: 0
Unit division: 0
Unit division: 0
Unit division: 0
Unit division: 0
Unit division: 0
Unit division: 0
Unit division: 0
Unit division: 0
Unit division: 0
Unit division: 0
Unit division: 0
Unit division: 0
Unit division: 0

## 2021-08-15 LAB — BPAM RBC
Blood Product Expiration Date: 202307232359
Blood Product Expiration Date: 202308022359
Blood Product Expiration Date: 202308022359
Blood Product Expiration Date: 202308052359
Blood Product Expiration Date: 202308052359
Blood Product Expiration Date: 202308052359
Blood Product Expiration Date: 202308052359
Blood Product Expiration Date: 202308052359
Blood Product Expiration Date: 202308052359
Blood Product Expiration Date: 202308092359
Blood Product Expiration Date: 202308102359
Blood Product Expiration Date: 202308102359
Blood Product Expiration Date: 202308102359
Blood Product Expiration Date: 202308102359
ISSUE DATE / TIME: 202307180718
ISSUE DATE / TIME: 202307180718
ISSUE DATE / TIME: 202307182323
ISSUE DATE / TIME: 202307190638
ISSUE DATE / TIME: 202307190942
ISSUE DATE / TIME: 202307191546
ISSUE DATE / TIME: 202307200809
ISSUE DATE / TIME: 202307200809
ISSUE DATE / TIME: 202307210741
ISSUE DATE / TIME: 202307210741
Unit Type and Rh: 6200
Unit Type and Rh: 6200
Unit Type and Rh: 6200
Unit Type and Rh: 6200
Unit Type and Rh: 6200
Unit Type and Rh: 6200
Unit Type and Rh: 6200
Unit Type and Rh: 6200
Unit Type and Rh: 6200
Unit Type and Rh: 6200
Unit Type and Rh: 6200
Unit Type and Rh: 6200
Unit Type and Rh: 6200
Unit Type and Rh: 6200

## 2021-08-15 LAB — BASIC METABOLIC PANEL
Anion gap: 4 — ABNORMAL LOW (ref 5–15)
Anion gap: 4 — ABNORMAL LOW (ref 5–15)
BUN: 42 mg/dL — ABNORMAL HIGH (ref 6–20)
BUN: 38 mg/dL — ABNORMAL HIGH (ref 6–20)
CO2: 23 mmol/L (ref 22–32)
CO2: 24 mmol/L (ref 22–32)
Calcium: 7.7 mg/dL — ABNORMAL LOW (ref 8.9–10.3)
Calcium: 7.8 mg/dL — ABNORMAL LOW (ref 8.9–10.3)
Chloride: 124 mmol/L — ABNORMAL HIGH (ref 98–111)
Chloride: 124 mmol/L — ABNORMAL HIGH (ref 98–111)
Creatinine, Ser: 0.78 mg/dL (ref 0.61–1.24)
Creatinine, Ser: 0.79 mg/dL (ref 0.61–1.24)
GFR, Estimated: >60 mL/min (ref >60)
GFR, Estimated: >60 mL/min (ref >60)
Glucose, Bld: 309 mg/dL — ABNORMAL HIGH (ref 70–99)
Glucose, Bld: 290 mg/dL — ABNORMAL HIGH (ref 70–99)
Potassium: 4.6 mmol/L (ref 3.5–5.1)
Potassium: 4.5 mmol/L (ref 3.5–5.1)
Sodium: 151 mmol/L — ABNORMAL HIGH (ref 135–145)
Sodium: 152 mmol/L — ABNORMAL HIGH (ref 135–145)

## 2021-08-15 LAB — PROTIME-INR
INR: 1.3 — ABNORMAL HIGH (ref 0.8–1.2)
Prothrombin Time: 16.5 seconds — ABNORMAL HIGH (ref 11.4–15.2)

## 2021-08-15 LAB — CBC
HCT: 19.8 % — ABNORMAL LOW (ref 39.0–52.0)
HCT: 22.5 % — ABNORMAL LOW (ref 39.0–52.0)
HCT: 21.6 % — ABNORMAL LOW (ref 39.0–52.0)
Hemoglobin: 6.6 g/dL — CL (ref 13.0–17.0)
Hemoglobin: 7.6 g/dL — ABNORMAL LOW (ref 13.0–17.0)
Hemoglobin: 7.3 g/dL — ABNORMAL LOW (ref 13.0–17.0)
MCH: 30 pg (ref 26.0–34.0)
MCH: 30.5 pg (ref 26.0–34.0)
MCH: 30.8 pg (ref 26.0–34.0)
MCHC: 33.3 g/dL (ref 30.0–36.0)
MCHC: 33.8 g/dL (ref 30.0–36.0)
MCHC: 33.8 g/dL (ref 30.0–36.0)
MCV: 90 fL (ref 80.0–100.0)
MCV: 90.4 fL (ref 80.0–100.0)
MCV: 91.1 fL (ref 80.0–100.0)
Platelets: 123 10*3/uL — ABNORMAL LOW (ref 150–400)
Platelets: 132 10*3/uL — ABNORMAL LOW (ref 150–400)
Platelets: 141 10*3/uL — ABNORMAL LOW (ref 150–400)
RBC: 2.2 MIL/uL — ABNORMAL LOW (ref 4.22–5.81)
RBC: 2.49 MIL/uL — ABNORMAL LOW (ref 4.22–5.81)
RBC: 2.37 MIL/uL — ABNORMAL LOW (ref 4.22–5.81)
RDW: 18 % — ABNORMAL HIGH (ref 11.5–15.5)
RDW: 17.2 % — ABNORMAL HIGH (ref 11.5–15.5)
RDW: 18.4 % — ABNORMAL HIGH (ref 11.5–15.5)
WBC: 19.3 10*3/uL — ABNORMAL HIGH (ref 4.0–10.5)
WBC: 19.8 10*3/uL — ABNORMAL HIGH (ref 4.0–10.5)
WBC: 21.3 10*3/uL — ABNORMAL HIGH (ref 4.0–10.5)
nRBC: 6.1 % — ABNORMAL HIGH (ref 0.0–0.2)
nRBC: 6.4 % — ABNORMAL HIGH (ref 0.0–0.2)
nRBC: 5.4 % — ABNORMAL HIGH (ref 0.0–0.2)

## 2021-08-15 LAB — HEPATIC FUNCTION PANEL
ALT: 36 U/L (ref 0–44)
AST: 42 U/L — ABNORMAL HIGH (ref 15–41)
Albumin: 1.8 g/dL — ABNORMAL LOW (ref 3.5–5.0)
Alkaline Phosphatase: 91 U/L (ref 38–126)
Bilirubin, Direct: 0.4 mg/dL — ABNORMAL HIGH (ref 0.0–0.2)
Indirect Bilirubin: 0.8 mg/dL (ref 0.3–0.9)
Total Bilirubin: 1.2 mg/dL (ref 0.3–1.2)
Total Protein: 4.5 g/dL — ABNORMAL LOW (ref 6.5–8.1)

## 2021-08-15 LAB — GLUCOSE, CAPILLARY
Glucose-Capillary: 236 mg/dL — ABNORMAL HIGH (ref 70–99)
Glucose-Capillary: 253 mg/dL — ABNORMAL HIGH (ref 70–99)
Glucose-Capillary: 258 mg/dL — ABNORMAL HIGH (ref 70–99)
Glucose-Capillary: 278 mg/dL — ABNORMAL HIGH (ref 70–99)
Glucose-Capillary: 285 mg/dL — ABNORMAL HIGH (ref 70–99)
Glucose-Capillary: 289 mg/dL — ABNORMAL HIGH (ref 70–99)

## 2021-08-15 LAB — HEPARIN LEVEL (UNFRACTIONATED)
Heparin Unfractionated: <0.1 IU/mL — ABNORMAL LOW (ref 0.30–0.70)
Heparin Unfractionated: 0.11 IU/mL — ABNORMAL LOW (ref 0.30–0.70)

## 2021-08-15 LAB — HEMOGLOBIN A1C
Hgb A1c MFr Bld: 5.5 % (ref 4.8–5.6)
Mean Plasma Glucose: 111.15 mg/dL

## 2021-08-15 LAB — HEMOGLOBIN AND HEMATOCRIT, BLOOD
HCT: 23.9 % — ABNORMAL LOW (ref 39.0–52.0)
Hemoglobin: 7.9 g/dL — ABNORMAL LOW (ref 13.0–17.0)

## 2021-08-15 LAB — LACTATE DEHYDROGENASE: LDH: 685 U/L — ABNORMAL HIGH (ref 98–192)

## 2021-08-15 LAB — APTT
aPTT: 39 seconds — ABNORMAL HIGH (ref 24–36)
aPTT: 48 seconds — ABNORMAL HIGH (ref 24–36)

## 2021-08-15 LAB — TRIGLYCERIDES: Triglycerides: 483 mg/dL — ABNORMAL HIGH (ref <150)

## 2021-08-15 LAB — PREPARE RBC (CROSSMATCH)

## 2021-08-15 LAB — FIBRINOGEN: Fibrinogen: 332 mg/dL (ref 210–475)

## 2021-08-15 MED ORDER — INSULIN ASPART 100 UNIT/ML IJ SOLN
0.0000 [IU] | INTRAMUSCULAR | Status: DC
  Administered 2021-08-15 (×2): 11 [IU] via SUBCUTANEOUS
  Administered 2021-08-15 – 2021-08-16 (×3): 7 [IU] via SUBCUTANEOUS
  Administered 2021-08-16 (×2): 11 [IU] via SUBCUTANEOUS
  Administered 2021-08-16 – 2021-08-17 (×4): 7 [IU] via SUBCUTANEOUS
  Administered 2021-08-17: 11 [IU] via SUBCUTANEOUS
  Administered 2021-08-17 – 2021-08-18 (×7): 7 [IU] via SUBCUTANEOUS
  Administered 2021-08-18: 4 [IU] via SUBCUTANEOUS
  Administered 2021-08-18: 7 [IU] via SUBCUTANEOUS
  Administered 2021-08-19: 4 [IU] via SUBCUTANEOUS
  Administered 2021-08-19 (×2): 7 [IU] via SUBCUTANEOUS
  Administered 2021-08-19: 3 [IU] via SUBCUTANEOUS
  Administered 2021-08-19: 7 [IU] via SUBCUTANEOUS
  Administered 2021-08-19 (×2): 4 [IU] via SUBCUTANEOUS
  Administered 2021-08-20 (×2): 3 [IU] via SUBCUTANEOUS
  Administered 2021-08-20: 4 [IU] via SUBCUTANEOUS
  Administered 2021-08-20 (×2): 3 [IU] via SUBCUTANEOUS
  Administered 2021-08-20: 4 [IU] via SUBCUTANEOUS
  Administered 2021-08-21: 7 [IU] via SUBCUTANEOUS
  Administered 2021-08-21: 16 [IU] via SUBCUTANEOUS
  Administered 2021-08-21 (×2): 3 [IU] via SUBCUTANEOUS
  Administered 2021-08-21 – 2021-08-22 (×2): 4 [IU] via SUBCUTANEOUS
  Administered 2021-08-22 – 2021-08-29 (×10): 3 [IU] via SUBCUTANEOUS
  Administered 2021-08-29: 4 [IU] via SUBCUTANEOUS
  Administered 2021-08-30 – 2021-09-02 (×12): 3 [IU] via SUBCUTANEOUS
  Administered 2021-09-02: 4 [IU] via SUBCUTANEOUS
  Administered 2021-09-04 – 2021-09-05 (×3): 3 [IU] via SUBCUTANEOUS

## 2021-08-15 MED ORDER — INSULIN ASPART 100 UNIT/ML IJ SOLN
3.0000 [IU] | INTRAMUSCULAR | Status: DC
  Administered 2021-08-15 – 2021-08-17 (×11): 3 [IU] via SUBCUTANEOUS

## 2021-08-15 MED ORDER — FUROSEMIDE 10 MG/ML IJ SOLN
20.0000 mg | Freq: Two times a day (BID) | INTRAMUSCULAR | Status: AC
  Administered 2021-08-15 (×2): 20 mg via INTRAVENOUS
  Filled 2021-08-15 (×2): qty 2

## 2021-08-15 MED ORDER — POTASSIUM CHLORIDE 20 MEQ PO PACK
40.0000 meq | PACK | Freq: Once | ORAL | Status: AC
  Administered 2021-08-15: 40 meq
  Filled 2021-08-15: qty 2

## 2021-08-15 MED ORDER — SODIUM CHLORIDE 0.9% IV SOLUTION: Freq: Once | INTRAVENOUS | Status: AC
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
## 2021-08-15 NOTE — Progress Notes (Signed)
Trauma/Critical Care Follow Up Note  Subjective:    Overnight Issues:   Objective:  Vital signs for last 24 hours: Temp:  [97.3 F (36.3 C)-99.3 F (37.4 C)] 98.4 F (36.9 C) (07/21 0700) Pulse Rate:  [84-127] 88 (07/21 0705) Resp:  [0-20] 20 (07/21 0705) BP: (108-147)/(49-95) 108/52 (07/21 0705) SpO2:  [96 %-100 %] 100 % (07/21 0705) Arterial Line BP: (102-151)/(45-66) 106/50 (07/21 0700) FiO2 (%):  [40 %] 40 % (07/21 0705) Weight:  [93.2 kg] 93.2 kg (07/21 0645)  Hemodynamic parameters for last 24 hours:    Intake/Output from previous day: 07/20 0701 - 07/21 0700 In: 6538 [I.V.:1393.7; Blood:652; HT/DS:2876.8; IV Piggyback:696.1] Out: 1157 [Urine:2792; Emesis/NG output:550; Drains:300; Stool:800; Blood:300; Chest Tube:130]  Intake/Output this shift: Total I/O In: 30 [NG/GT:30] Out: 505 [Urine:285; Emesis/NG output:150; Drains:50; Chest Tube:20]  Vent settings for last 24 hours: Vent Mode: PRVC FiO2 (%):  [40 %] 40 % Set Rate:  [15 bmp-20 bmp] 20 bmp Vt Set:  [540 mL] 540 mL PEEP:  [12 cmH20-14 cmH20] 12 cmH20 Plateau Pressure:  [21 cmH20-26 cmH20] 25 cmH20  Physical Exam:  Gen: comfortable, no distress Neuro: sedated HEENT: PERRL Neck: supple CV: RRR Pulm: unlabored breathing Abd: soft, NT GU: clear yellow urine Extr: wwp, no edema   Results for orders placed or performed during the hospital encounter of 08/05/21 (from the past 24 hour(s))  Glucose, capillary     Status: Abnormal   Collection Time: 08/14/21  1:31 PM  Result Value Ref Range   Glucose-Capillary 147 (H) 70 - 99 mg/dL  I-STAT 7, (LYTES, BLD GAS, ICA, H+H)     Status: Abnormal   Collection Time: 08/14/21  1:32 PM  Result Value Ref Range   pH, Arterial 7.436 7.35 - 7.45   pCO2 arterial 33.4 32 - 48 mmHg   pO2, Arterial 227 (H) 83 - 108 mmHg   Bicarbonate 22.7 20.0 - 28.0 mmol/L   TCO2 24 22 - 32 mmol/L   O2 Saturation 100 %   Acid-base deficit 2.0 0.0 - 2.0 mmol/L   Sodium 152 (H)  135 - 145 mmol/L   Potassium 4.5 3.5 - 5.1 mmol/L   Calcium, Ion 1.27 1.15 - 1.40 mmol/L   HCT 21.0 (L) 39.0 - 52.0 %   Hemoglobin 7.1 (L) 13.0 - 17.0 g/dL   Patient temperature 26.2 C    Collection site RADIAL, ALLEN'S TEST ACCEPTABLE    Drawn by HIDE    Sample type ARTERIAL   CBC     Status: Abnormal   Collection Time: 08/14/21  1:38 PM  Result Value Ref Range   WBC 22.9 (H) 4.0 - 10.5 K/uL   RBC 2.34 (L) 4.22 - 5.81 MIL/uL   Hemoglobin 6.9 (LL) 13.0 - 17.0 g/dL   HCT 03.5 (L) 59.7 - 41.6 %   MCV 87.6 80.0 - 100.0 fL   MCH 29.5 26.0 - 34.0 pg   MCHC 33.7 30.0 - 36.0 g/dL   RDW 38.4 (H) 53.6 - 46.8 %   Platelets 116 (L) 150 - 400 K/uL   nRBC 3.2 (H) 0.0 - 0.2 %  Prepare RBC (crossmatch)     Status: None   Collection Time: 08/14/21  2:15 PM  Result Value Ref Range   Order Confirmation      ORDER PROCESSED BY BLOOD BANK BB SAMPLE OR UNITS ALREADY AVAILABLE Performed at [name], [address] [place], [place] [address]   I-STAT 7, (LYTES, BLD GAS, ICA, H+H)  Status: Abnormal   Collection Time: 08/14/21  2:50 PM  Result Value Ref Range   pH, Arterial 7.320 (L) 7.35 - 7.45   pCO2 arterial 42.0 32 - 48 mmHg   pO2, Arterial 175 (H) 83 - 108 mmHg   Bicarbonate 21.6 20.0 - 28.0 mmol/L   TCO2 23 22 - 32 mmol/L   O2 Saturation 99 %   Acid-base deficit 4.0 (H) 0.0 - 2.0 mmol/L   Sodium 152 (H) 135 - 145 mmol/L   Potassium 4.7 3.5 - 5.1 mmol/L   Calcium, Ion 1.28 1.15 - 1.40 mmol/L   HCT 22.0 (L) 39.0 - 52.0 %   Hemoglobin 7.5 (L) 13.0 - 17.0 g/dL   Patient temperature 20.2 C    Collection site RADIAL, ALLEN'S TEST ACCEPTABLE    Drawn by HIDE    Sample type ARTERIAL   I-STAT 7, (LYTES, BLD GAS, ICA, H+H)     Status: Abnormal   Collection Time: 08/14/21  3:40 PM  Result Value Ref Range   pH, Arterial 7.304 (L) 7.35 - 7.45   pCO2 arterial 45.7 32 - 48 mmHg   pO2, Arterial 203 (H) 83 - 108 mmHg   Bicarbonate 22.7 20.0 - 28.0 mmol/L   TCO2 24 22 - 32 mmol/L    O2 Saturation 100 %   Acid-base deficit 3.0 (H) 0.0 - 2.0 mmol/L   Sodium 152 (H) 135 - 145 mmol/L   Potassium 4.6 3.5 - 5.1 mmol/L   Calcium, Ion 1.23 1.15 - 1.40 mmol/L   HCT 24.0 (L) 39.0 - 52.0 %   Hemoglobin 8.2 (L) 13.0 - 17.0 g/dL   Patient temperature 54.2 C    Collection site RADIAL, ALLEN'S TEST ACCEPTABLE    Drawn by HIDE    Sample type ARTERIAL   Glucose, capillary     Status: Abnormal   Collection Time: 08/14/21  3:50 PM  Result Value Ref Range   Glucose-Capillary 201 (H) 70 - 99 mg/dL  CBC     Status: Abnormal   Collection Time: 08/14/21  5:58 PM  Result Value Ref Range   WBC 22.2 (H) 4.0 - 10.5 K/uL   RBC 2.57 (L) 4.22 - 5.81 MIL/uL   Hemoglobin 7.5 (L) 13.0 - 17.0 g/dL   HCT 70.6 (L) 23.7 - 62.8 %   MCV 87.9 80.0 - 100.0 fL   MCH 29.2 26.0 - 34.0 pg   MCHC 33.2 30.0 - 36.0 g/dL   RDW 31.5 (H) 17.6 - 16.0 %   Platelets 121 (L) 150 - 400 K/uL   nRBC 5.2 (H) 0.0 - 0.2 %  Basic metabolic panel     Status: Abnormal   Collection Time: 08/14/21  5:58 PM  Result Value Ref Range   Sodium 152 (H) 135 - 145 mmol/L   Potassium 4.6 3.5 - 5.1 mmol/L   Chloride 126 (H) 98 - 111 mmol/L   CO2 22 22 - 32 mmol/L   Glucose, Bld 224 (H) 70 - 99 mg/dL   BUN 46 (H) 6 - 20 mg/dL   Creatinine, Ser 7.37 0.61 - 1.24 mg/dL   Calcium 7.6 (L) 8.9 - 10.3 mg/dL   GFR, Estimated >10 >62 mL/min   Anion gap 4 (L) 5 - 15  APTT     Status: None   Collection Time: 08/14/21  5:58 PM  Result Value Ref Range   aPTT 35 24 - 36 seconds  Heparin level (unfractionated)     Status: Abnormal   Collection Time: 08/14/21  5:58 PM  Result Value Ref Range   Heparin Unfractionated <0.10 (L) 0.30 - 0.70 IU/mL  I-STAT 7, (LYTES, BLD GAS, ICA, H+H)     Status: Abnormal   Collection Time: 08/14/21  5:59 PM  Result Value Ref Range   pH, Arterial 7.411 7.35 - 7.45   pCO2 arterial 34.9 32 - 48 mmHg   pO2, Arterial 170 (H) 83 - 108 mmHg   Bicarbonate 22.2 20.0 - 28.0 mmol/L   TCO2 23 22 - 32 mmol/L   O2  Saturation 100 %   Acid-base deficit 2.0 0.0 - 2.0 mmol/L   Sodium 150 (H) 135 - 145 mmol/L   Potassium 4.5 3.5 - 5.1 mmol/L   Calcium, Ion 1.24 1.15 - 1.40 mmol/L   HCT 22.0 (L) 39.0 - 52.0 %   Hemoglobin 7.5 (L) 13.0 - 17.0 g/dL   Patient temperature 36.8 C    Collection site RADIAL, ALLEN'S TEST ACCEPTABLE    Drawn by HIDE    Sample type ARTERIAL   Glucose, capillary     Status: Abnormal   Collection Time: 08/14/21  6:54 PM  Result Value Ref Range   Glucose-Capillary 261 (H) 70 - 99 mg/dL  I-STAT 7, (LYTES, BLD GAS, ICA, H+H)     Status: Abnormal   Collection Time: 08/14/21  6:54 PM  Result Value Ref Range   pH, Arterial 7.348 (L) 7.35 - 7.45   pCO2 arterial 39.4 32 - 48 mmHg   pO2, Arterial 161 (H) 83 - 108 mmHg   Bicarbonate 21.7 20.0 - 28.0 mmol/L   TCO2 23 22 - 32 mmol/L   O2 Saturation 99 %   Acid-base deficit 4.0 (H) 0.0 - 2.0 mmol/L   Sodium 151 (H) 135 - 145 mmol/L   Potassium 4.5 3.5 - 5.1 mmol/L   Calcium, Ion 1.22 1.15 - 1.40 mmol/L   HCT 21.0 (L) 39.0 - 52.0 %   Hemoglobin 7.1 (L) 13.0 - 17.0 g/dL   Patient temperature 36.8 C    Collection site RADIAL, ALLEN'S TEST ACCEPTABLE    Drawn by HIDE    Sample type ARTERIAL   I-STAT 7, (LYTES, BLD GAS, ICA, H+H)     Status: Abnormal   Collection Time: 08/14/21 10:14 PM  Result Value Ref Range   pH, Arterial 7.348 (L) 7.35 - 7.45   pCO2 arterial 40.2 32 - 48 mmHg   pO2, Arterial 142 (H) 83 - 108 mmHg   Bicarbonate 22.1 20.0 - 28.0 mmol/L   TCO2 23 22 - 32 mmol/L   O2 Saturation 99 %   Acid-base deficit 3.0 (H) 0.0 - 2.0 mmol/L   Sodium 150 (H) 135 - 145 mmol/L   Potassium 4.4 3.5 - 5.1 mmol/L   Calcium, Ion 1.23 1.15 - 1.40 mmol/L   HCT 19.0 (L) 39.0 - 52.0 %   Hemoglobin 6.5 (LL) 13.0 - 17.0 g/dL   Patient temperature 36.9 C    Sample type ARTERIAL    Comment NOTIFIED PHYSICIAN   Type and screen [place]     Status: None (Preliminary result)   Collection Time: 08/14/21 11:08 PM  Result  Value Ref Range   ABO/RH(D) A POS    Antibody Screen NEG    Sample Expiration 08/17/2021,2359    Unit Number [id number]    Blood Component Type RED CELLS,LR    Unit division 00    Status of Unit ALLOCATED    Transfusion Status OK TO TRANSFUSE    Crossmatch Result  Compatible    Unit Number [id number]    Blood Component Type RED CELLS,LR    Unit division 00    Status of Unit ALLOCATED    Transfusion Status OK TO TRANSFUSE    Crossmatch Result Compatible    Unit Number [id number]    Blood Component Type RBC LR PHER1    Unit division 00    Status of Unit ALLOCATED    Transfusion Status OK TO TRANSFUSE    Crossmatch Result Compatible    Unit Number [id number]    Blood Component Type RBC LR PHER2    Unit division 00    Status of Unit ISSUED    Transfusion Status OK TO TRANSFUSE    Crossmatch Result      Compatible Performed at [facility], [address], [place], [place] [address]   Glucose, capillary     Status: Abnormal   Collection Time: 08/15/21 12:22 AM  Result Value Ref Range   Glucose-Capillary 253 (H) 70 - 99 mg/dL  I-STAT 7, (LYTES, BLD GAS, ICA, H+H)     Status: Abnormal   Collection Time: 08/15/21  1:24 AM  Result Value Ref Range   pH, Arterial 7.338 (L) 7.35 - 7.45   pCO2 arterial 42.8 32 - 48 mmHg   pO2, Arterial 154 (H) 83 - 108 mmHg   Bicarbonate 23.0 20.0 - 28.0 mmol/L   TCO2 24 22 - 32 mmol/L   O2 Saturation 99 %   Acid-base deficit 3.0 (H) 0.0 - 2.0 mmol/L   Sodium 149 (H) 135 - 145 mmol/L   Potassium 4.4 3.5 - 5.1 mmol/L   Calcium, Ion 1.23 1.15 - 1.40 mmol/L   HCT 17.0 (L) 39.0 - 52.0 %   Hemoglobin 5.8 (LL) 13.0 - 17.0 g/dL   Patient temperature 97.6 C    Sample type ARTERIAL    Comment NOTIFIED PHYSICIAN   CBC     Status: Abnormal   Collection Time: 08/15/21  1:38 AM  Result Value Ref Range   WBC 19.3 (H) 4.0 - 10.5 K/uL   RBC 2.20 (L) 4.22 - 5.81 MIL/uL   Hemoglobin 6.6 (LL) 13.0 - 17.0 g/dL   HCT 73.4 (L) 19.3 - 79.0 %    MCV 90.0 80.0 - 100.0 fL   MCH 30.0 26.0 - 34.0 pg   MCHC 33.3 30.0 - 36.0 g/dL   RDW 24.0 (H) 97.3 - 53.2 %   Platelets 123 (L) 150 - 400 K/uL   nRBC 6.1 (H) 0.0 - 0.2 %  Prepare RBC (crossmatch)     Status: None   Collection Time: 08/15/21  2:30 AM  Result Value Ref Range   Order Confirmation      BB SAMPLE OR UNITS ALREADY AVAILABLE Performed at [facility], [address], [place], [place] [address]   I-STAT 7, (LYTES, BLD GAS, ICA, H+H)     Status: Abnormal   Collection Time: 08/15/21  4:33 AM  Result Value Ref Range   pH, Arterial 7.341 (L) 7.35 - 7.45   pCO2 arterial 41.6 32 - 48 mmHg   pO2, Arterial 118 (H) 83 - 108 mmHg   Bicarbonate 22.5 20.0 - 28.0 mmol/L   TCO2 24 22 - 32 mmol/L   O2 Saturation 98 %   Acid-base deficit 3.0 (H) 0.0 - 2.0 mmol/L   Sodium 150 (H) 135 - 145 mmol/L   Potassium 4.6 3.5 - 5.1 mmol/L   Calcium, Ion 1.25 1.15 - 1.40 mmol/L  HCT 21.0 (L) 39.0 - 52.0 %   Hemoglobin 7.1 (L) 13.0 - 17.0 g/dL   Patient temperature 37.0 C    Sample type ARTERIAL   Basic metabolic panel     Status: Abnormal   Collection Time: 08/15/21  5:00 AM  Result Value Ref Range   Sodium 151 (H) 135 - 145 mmol/L   Potassium 4.6 3.5 - 5.1 mmol/L   Chloride 124 (H) 98 - 111 mmol/L   CO2 23 22 - 32 mmol/L   Glucose, Bld 309 (H) 70 - 99 mg/dL   BUN 42 (H) 6 - 20 mg/dL   Creatinine, Ser 0.78 0.61 - 1.24 mg/dL   Calcium 7.7 (L) 8.9 - 10.3 mg/dL   GFR, Estimated >60 >60 mL/min   Anion gap 4 (L) 5 - 15  Lactate dehydrogenase     Status: Abnormal   Collection Time: 08/15/21  5:00 AM  Result Value Ref Range   LDH 685 (H) 98 - 192 U/L  Protime-INR     Status: Abnormal   Collection Time: 08/15/21  5:00 AM  Result Value Ref Range   Prothrombin Time 16.5 (H) 11.4 - 15.2 seconds   INR 1.3 (H) 0.8 - 1.2  Fibrinogen     Status: None   Collection Time: 08/15/21  5:00 AM  Result Value Ref Range   Fibrinogen 332 210 - 475 mg/dL  Hepatic function panel     Status:  Abnormal   Collection Time: 08/15/21  5:00 AM  Result Value Ref Range   Total Protein 4.5 (L) 6.5 - 8.1 g/dL   Albumin 1.8 (L) 3.5 - 5.0 g/dL   AST 42 (H) 15 - 41 U/L   ALT 36 0 - 44 U/L   Alkaline Phosphatase 91 38 - 126 U/L   Total Bilirubin 1.2 0.3 - 1.2 mg/dL   Bilirubin, Direct 0.4 (H) 0.0 - 0.2 mg/dL   Indirect Bilirubin 0.8 0.3 - 0.9 mg/dL  APTT     Status: Abnormal   Collection Time: 08/15/21  5:00 AM  Result Value Ref Range   aPTT 39 (H) 24 - 36 seconds  Triglycerides     Status: Abnormal   Collection Time: 08/15/21  5:00 AM  Result Value Ref Range   Triglycerides 483 (H) <150 mg/dL  Heparin level (unfractionated)     Status: Abnormal   Collection Time: 08/15/21  5:00 AM  Result Value Ref Range   Heparin Unfractionated <0.10 (L) 0.30 - 0.70 IU/mL  Hemoglobin and hematocrit, blood     Status: Abnormal   Collection Time: 08/15/21  5:00 AM  Result Value Ref Range   Hemoglobin 7.9 (L) 13.0 - 17.0 g/dL   HCT 23.9 (L) 39.0 - 52.0 %  Glucose, capillary     Status: Abnormal   Collection Time: 08/15/21  5:05 AM  Result Value Ref Range   Glucose-Capillary 289 (H) 70 - 99 mg/dL  I-STAT 7, (LYTES, BLD GAS, ICA, H+H)     Status: Abnormal   Collection Time: 08/15/21  6:30 AM  Result Value Ref Range   pH, Arterial 7.339 (L) 7.35 - 7.45   pCO2 arterial 42.3 32 - 48 mmHg   pO2, Arterial 140 (H) 83 - 108 mmHg   Bicarbonate 22.8 20.0 - 28.0 mmol/L   TCO2 24 22 - 32 mmol/L   O2 Saturation 99 %   Acid-base deficit 3.0 (H) 0.0 - 2.0 mmol/L   Sodium 150 (H) 135 - 145 mmol/L   Potassium 4.2 3.5 -  5.1 mmol/L   Calcium, Ion 1.25 1.15 - 1.40 mmol/L   HCT 19.0 (L) 39.0 - 52.0 %   Hemoglobin 6.5 (LL) 13.0 - 17.0 g/dL   Patient temperature 67.8 C    Sample type ARTERIAL    Comment NOTIFIED PHYSICIAN   I-STAT 7, (LYTES, BLD GAS, ICA, H+H)     Status: Abnormal   Collection Time: 08/15/21  7:38 AM  Result Value Ref Range   pH, Arterial 7.380 7.35 - 7.45   pCO2 arterial 38.8 32 - 48 mmHg    pO2, Arterial 150 (H) 83 - 108 mmHg   Bicarbonate 23.0 20.0 - 28.0 mmol/L   TCO2 24 22 - 32 mmol/L   O2 Saturation 99 %   Acid-base deficit 2.0 0.0 - 2.0 mmol/L   Sodium 151 (H) 135 - 145 mmol/L   Potassium 4.4 3.5 - 5.1 mmol/L   Calcium, Ion 1.25 1.15 - 1.40 mmol/L   HCT 20.0 (L) 39.0 - 52.0 %   Hemoglobin 6.8 (LL) 13.0 - 17.0 g/dL   Patient temperature 93.8 C    Sample type ARTERIAL    Comment NOTIFIED PHYSICIAN   Glucose, capillary     Status: Abnormal   Collection Time: 08/15/21  8:58 AM  Result Value Ref Range   Glucose-Capillary 278 (H) 70 - 99 mg/dL  I-STAT 7, (LYTES, BLD GAS, ICA, H+H)     Status: Abnormal   Collection Time: 08/15/21  9:00 AM  Result Value Ref Range   pH, Arterial 7.362 7.35 - 7.45   pCO2 arterial 41.0 32 - 48 mmHg   pO2, Arterial 115 (H) 83 - 108 mmHg   Bicarbonate 23.3 20.0 - 28.0 mmol/L   TCO2 25 22 - 32 mmol/L   O2 Saturation 98 %   Acid-base deficit 2.0 0.0 - 2.0 mmol/L   Sodium 151 (H) 135 - 145 mmol/L   Potassium 4.4 3.5 - 5.1 mmol/L   Calcium, Ion 1.26 1.15 - 1.40 mmol/L   HCT 20.0 (L) 39.0 - 52.0 %   Hemoglobin 6.8 (LL) 13.0 - 17.0 g/dL   Patient temperature 10.1 C    Sample type ARTERIAL    Comment NOTIFIED PHYSICIAN     Assessment & Plan: The plan of care was discussed in multi-disciplinary ECMO rounds for the day, who is in agreement with this plan and no additional concerns were raised.   Present on Admission:  Trauma of chest  ARDS (adult respiratory distress syndrome) ([name])  Contusion of left lung  Acute on chronic respiratory failure with hypoxia and hypercapnia ([name])  Critical polytrauma  TBI (traumatic brain injury) ([name])    LOS: 10 days   Additional comments:I reviewed the patient's new clinical lab test results.   and I reviewed the patients new imaging test results.    6M [facility]   SDH, IVH, SAH - NSGY c/s, Dr. Maurice Small, repeat CT head 7/12 some IVH but fairly stable, okay for above normal sodiums and for heparin as  long as no bolus dosing. Head CT 7/16 with edema, but improvement in IVH. No midline shift. L rib fx 1-8 with L HPTX - L CT on suction, keep at -40 Extensive pulmonary consolidation with refractory hypoxemia - cannulated for VV-ECMO 7/11, sweep at 3, flow 4.75 Suspected aspiration - empiric meropenem/vanc, cx neg, can stop both Fracture dislocation L elbow/olecranon and ulnar styloid with comminuted fracture of the left midshaft and distal humerus - initial eval by Dr. Linna Caprice, splinted, definitive care per Dr. Derinda Sis Trauma,  OR 7/13 with Dr. Doreatha Martin I&D, CR L humerus. OR 7/20 with Dr. Marcelino Scot for definitive repair Comminuted fracture dislocation R hip - reduced in TB, KI in place, traction pin 7/13 by Dr. Doreatha Martin.  Will need closed management with traction due to proximity of soft tissue wound.  Complex lacerations RUE/RLE with degloving - operative washout and repair 7/13 by Dr. Doreatha Martin, vac to RLE, proximity to R hip makes fixation high risk. OR for washout 7/18 with Dr. Marcelino Scot. WOC c/s for vac changes MWF, to start 7/24 R comminuted first metacarpal fracture/open - irrigated and splinted by Dr. Lyla Glassing, hand surgery notified by EDP Coagulopathy - platelets 121 ID - recommend d/c of both mero and vanc Acute blood loss anemia  FEN - TF at 95/h, will d/w Nutrition if possibly overfeeding as this may contribute to hypercarbia DVT - SCDs, heparin gtt restarted, titrating up to 900 from [place] Total Time: 45 minutes  Jesusita Oka, MD Trauma & General Surgery Please use [email or web address] to contact on call provider  08/15/2021  *Care during the described time interval was provided by me. I have reviewed this patient's available data, including medical history, events of note, physical examination and test results as part of my evaluation.
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
## 2021-08-15 NOTE — Inpatient Diabetes Management (Signed)
Inpatient Diabetes Program Recommendations  AACE/ADA: New Consensus Statement on Inpatient Glycemic Control (2015)  Target Ranges:  Prepandial:   less than 140 mg/dL      Peak postprandial:   less than 180 mg/dL (1-2 hours)      Critically ill patients:  140 - 180 mg/dL   Lab Results  Component Value Date   GLUCAP 278 (H) 08/15/2021    Review of Glycemic Control  Latest Reference Range & Units 08/14/21 18:54 08/15/21 00:22 08/15/21 05:05 08/15/21 08:58  Glucose-Capillary 70 - 99 mg/dL 237 (H) 628 (H) 315 (H) 278 (H)  (H): Data is abnormally high  Current orders for Inpatient glycemic control:  Novolog 2-6 units Q4 hours, Levemir 5 units QHS   Pivot Tube Feeds rate increased to 95 ml/hr   Inpatient Diabetes Program Recommendations:      Consider adding Novolog 3 units Q4 hours Tube Feed Coverage (to be stopped or held in the event tube feeds are stopped).   Thanks, Lujean Rave, MSN, RNC-OB Diabetes Coordinator [phone] (8a-5p)

## 2021-08-15 NOTE — Consult Note (Signed)
WOC Nurse Consult Note: Patient receiving care 2H03 Reason for Consult: R thigh vac changes MWF, start 7/24 WOC will order supplies and see for first dressing change on Mon. Request PA or MD present for first dressing change.   Renaldo Reel Katrinka Blazing, MSN, RN, CMSRN, Angus Seller, [facility] Wound Treatment Associate Pager [phone]
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
## 2021-08-15 NOTE — Progress Notes (Signed)
Trauma Event Note    TRN at bedside to round. Remains intubated/sedated, family at bedside. Returned from OR earlier today for left humerus ORIF. Checked in with ECMO coordinator at bedside, answered questions. Patient's hemoglobin low this shift, CBC in process per Greig Castilla with ECMO team. Heparin resumed tonight. Patient edematous appearing, sedated with no response to painful stimuli on my assessment.  Last imported Vital Signs BP (!) 113/49   Pulse (!) 108   Temp 98.6 F (37 C)   Resp 20   Ht 5\' 8"  (1.727 m)   Wt 192 lb 3.9 oz (87.2 kg)   SpO2 100%   BMI 29.23 kg/m   Trending CBC Recent Labs    08/14/21 1338 08/14/21 1450 08/14/21 1758 08/14/21 1759 08/15/21 0138 08/15/21 0433 08/15/21 0500 08/15/21 0630  WBC 22.9*  --  22.2*  --  19.3*  --   --   --   HGB 6.9*   < > 7.5*   < > 6.6* 7.1* 7.9* 6.5*  HCT 20.5*   < > 22.6*   < > 19.8* 21.0* 23.9* 19.0*  PLT 116*  --  121*  --  123*  --   --   --    < > = values in this interval not displayed.    Trending Coag's Recent Labs    08/13/21 0258 08/13/21 1708 08/14/21 0354 08/14/21 1758 08/15/21 0500  APTT 38*   < > 37* 35 39*  INR 1.3*  --  1.3*  --  1.3*   < > = values in this interval not displayed.    Trending BMET Recent Labs    08/14/21 0354 08/14/21 0356 08/14/21 1758 08/14/21 1759 08/15/21 0433 08/15/21 0500 08/15/21 0630  NA 151*   < > 152*   < > 150* 151* 150*  K 4.2   < > 4.6   < > 4.6 4.6 4.2  CL 125*  --  126*  --   --  124*  --   CO2 22  --  22  --   --  23  --   BUN 39*  --  46*  --   --  42*  --   CREATININE 0.72  --  0.92  --   --  0.78  --   GLUCOSE 174*  --  224*  --   --  309*  --    < > = values in this interval not displayed.      Yessica Putnam O Gladyse Corvin  Trauma Response RN  Please call TRN at [phone] for further assistance.
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
## 2021-08-16 ENCOUNTER — Inpatient Hospital Stay (HOSPITAL_COMMUNITY): Payer: Medicaid Other

## 2021-08-16 DIAGNOSIS — T07XXXA Unspecified multiple injuries, initial encounter: Secondary | ICD-10-CM | POA: Diagnosis not present

## 2021-08-16 DIAGNOSIS — J80 Acute respiratory distress syndrome: Secondary | ICD-10-CM | POA: Diagnosis not present

## 2021-08-16 LAB — POCT I-STAT 7, (LYTES, BLD GAS, ICA,H+H)
Acid-Base Excess: 0 mmol/L (ref 0.0–2.0)
Acid-Base Excess: 0 mmol/L (ref 0.0–2.0)
Acid-Base Excess: 1 mmol/L (ref 0.0–2.0)
Acid-Base Excess: 0 mmol/L (ref 0.0–2.0)
Acid-Base Excess: 1 mmol/L (ref 0.0–2.0)
Acid-Base Excess: 1 mmol/L (ref 0.0–2.0)
Acid-Base Excess: 2 mmol/L (ref 0.0–2.0)
Acid-Base Excess: 0 mmol/L (ref 0.0–2.0)
Acid-Base Excess: 1 mmol/L (ref 0.0–2.0)
Acid-Base Excess: 3 mmol/L — ABNORMAL HIGH (ref 0.0–2.0)
Bicarbonate: 25.5 mmol/L (ref 20.0–28.0)
Bicarbonate: 25.5 mmol/L (ref 20.0–28.0)
Bicarbonate: 26.5 mmol/L (ref 20.0–28.0)
Bicarbonate: 25.9 mmol/L (ref 20.0–28.0)
Bicarbonate: 26.5 mmol/L (ref 20.0–28.0)
Bicarbonate: 26.6 mmol/L (ref 20.0–28.0)
Bicarbonate: 27.5 mmol/L (ref 20.0–28.0)
Bicarbonate: 26.9 mmol/L (ref 20.0–28.0)
Bicarbonate: 27.5 mmol/L (ref 20.0–28.0)
Bicarbonate: 28.1 mmol/L — ABNORMAL HIGH (ref 20.0–28.0)
Calcium, Ion: 1.29 mmol/L (ref 1.15–1.40)
Calcium, Ion: 1.28 mmol/L (ref 1.15–1.40)
Calcium, Ion: 1.26 mmol/L (ref 1.15–1.40)
Calcium, Ion: 1.25 mmol/L (ref 1.15–1.40)
Calcium, Ion: 1.26 mmol/L (ref 1.15–1.40)
Calcium, Ion: 1.27 mmol/L (ref 1.15–1.40)
Calcium, Ion: 1.28 mmol/L (ref 1.15–1.40)
Calcium, Ion: 1.24 mmol/L (ref 1.15–1.40)
Calcium, Ion: 1.25 mmol/L (ref 1.15–1.40)
Calcium, Ion: 1.23 mmol/L (ref 1.15–1.40)
HCT: 18 % — ABNORMAL LOW (ref 39.0–52.0)
HCT: 17 % — ABNORMAL LOW (ref 39.0–52.0)
HCT: 20 % — ABNORMAL LOW (ref 39.0–52.0)
HCT: 19 % — ABNORMAL LOW (ref 39.0–52.0)
HCT: 19 % — ABNORMAL LOW (ref 39.0–52.0)
HCT: 18 % — ABNORMAL LOW (ref 39.0–52.0)
HCT: 17 % — ABNORMAL LOW (ref 39.0–52.0)
HCT: 22 % — ABNORMAL LOW (ref 39.0–52.0)
HCT: 24 % — ABNORMAL LOW (ref 39.0–52.0)
HCT: 23 % — ABNORMAL LOW (ref 39.0–52.0)
Hemoglobin: 6.1 g/dL — CL (ref 13.0–17.0)
Hemoglobin: 5.8 g/dL — CL (ref 13.0–17.0)
Hemoglobin: 6.8 g/dL — CL (ref 13.0–17.0)
Hemoglobin: 6.5 g/dL — CL (ref 13.0–17.0)
Hemoglobin: 6.5 g/dL — CL (ref 13.0–17.0)
Hemoglobin: 6.1 g/dL — CL (ref 13.0–17.0)
Hemoglobin: 5.8 g/dL — CL (ref 13.0–17.0)
Hemoglobin: 7.5 g/dL — ABNORMAL LOW (ref 13.0–17.0)
Hemoglobin: 8.2 g/dL — ABNORMAL LOW (ref 13.0–17.0)
Hemoglobin: 7.8 g/dL — ABNORMAL LOW (ref 13.0–17.0)
O2 Saturation: 99 %
O2 Saturation: 99 %
O2 Saturation: 99 %
O2 Saturation: 97 %
O2 Saturation: 98 %
O2 Saturation: 96 %
O2 Saturation: 99 %
O2 Saturation: 91 %
O2 Saturation: 97 %
O2 Saturation: 99 %
Patient temperature: 36.7
Patient temperature: 36.7
Patient temperature: 36.9
Patient temperature: 36.9
Patient temperature: 36.9
Patient temperature: 36.9
Patient temperature: 37
Potassium: 4.6 mmol/L (ref 3.5–5.1)
Potassium: 4.4 mmol/L (ref 3.5–5.1)
Potassium: 4.4 mmol/L (ref 3.5–5.1)
Potassium: 4.3 mmol/L (ref 3.5–5.1)
Potassium: 4.4 mmol/L (ref 3.5–5.1)
Potassium: 4.5 mmol/L (ref 3.5–5.1)
Potassium: 4.3 mmol/L (ref 3.5–5.1)
Potassium: 4.1 mmol/L (ref 3.5–5.1)
Potassium: 4.2 mmol/L (ref 3.5–5.1)
Potassium: 3.9 mmol/L (ref 3.5–5.1)
Sodium: 152 mmol/L — ABNORMAL HIGH (ref 135–145)
Sodium: 152 mmol/L — ABNORMAL HIGH (ref 135–145)
Sodium: 152 mmol/L — ABNORMAL HIGH (ref 135–145)
Sodium: 152 mmol/L — ABNORMAL HIGH (ref 135–145)
Sodium: 152 mmol/L — ABNORMAL HIGH (ref 135–145)
Sodium: 152 mmol/L — ABNORMAL HIGH (ref 135–145)
Sodium: 152 mmol/L — ABNORMAL HIGH (ref 135–145)
Sodium: 151 mmol/L — ABNORMAL HIGH (ref 135–145)
Sodium: 152 mmol/L — ABNORMAL HIGH (ref 135–145)
Sodium: 152 mmol/L — ABNORMAL HIGH (ref 135–145)
TCO2: 27 mmol/L (ref 22–32)
TCO2: 27 mmol/L (ref 22–32)
TCO2: 28 mmol/L (ref 22–32)
TCO2: 27 mmol/L (ref 22–32)
TCO2: 28 mmol/L (ref 22–32)
TCO2: 28 mmol/L (ref 22–32)
TCO2: 29 mmol/L (ref 22–32)
TCO2: 29 mmol/L (ref 22–32)
TCO2: 29 mmol/L (ref 22–32)
TCO2: 30 mmol/L (ref 22–32)
pCO2 arterial: 41.4 mmHg (ref 32–48)
pCO2 arterial: 42.2 mmHg (ref 32–48)
pCO2 arterial: 47.8 mmHg (ref 32–48)
pCO2 arterial: 47.4 mmHg (ref 32–48)
pCO2 arterial: 48.1 mmHg — ABNORMAL HIGH (ref 32–48)
pCO2 arterial: 51.2 mmHg — ABNORMAL HIGH (ref 32–48)
pCO2 arterial: 48.8 mmHg — ABNORMAL HIGH (ref 32–48)
pCO2 arterial: 50.4 mmHg — ABNORMAL HIGH (ref 32–48)
pCO2 arterial: 53.6 mmHg — ABNORMAL HIGH (ref 32–48)
pCO2 arterial: 45.5 mmHg (ref 32–48)
pH, Arterial: 7.396 (ref 7.35–7.45)
pH, Arterial: 7.388 (ref 7.35–7.45)
pH, Arterial: 7.352 (ref 7.35–7.45)
pH, Arterial: 7.345 — ABNORMAL LOW (ref 7.35–7.45)
pH, Arterial: 7.349 — ABNORMAL LOW (ref 7.35–7.45)
pH, Arterial: 7.324 — ABNORMAL LOW (ref 7.35–7.45)
pH, Arterial: 7.359 (ref 7.35–7.45)
pH, Arterial: 7.308 — ABNORMAL LOW (ref 7.35–7.45)
pH, Arterial: 7.344 — ABNORMAL LOW (ref 7.35–7.45)
pH, Arterial: 7.399 (ref 7.35–7.45)
pO2, Arterial: 142 mmHg — ABNORMAL HIGH (ref 83–108)
pO2, Arterial: 139 mmHg — ABNORMAL HIGH (ref 83–108)
pO2, Arterial: 145 mmHg — ABNORMAL HIGH (ref 83–108)
pO2, Arterial: 106 mmHg (ref 83–108)
pO2, Arterial: 91 mmHg (ref 83–108)
pO2, Arterial: 88 mmHg (ref 83–108)
pO2, Arterial: 128 mmHg — ABNORMAL HIGH (ref 83–108)
pO2, Arterial: 100 mmHg (ref 83–108)
pO2, Arterial: 67 mmHg — ABNORMAL LOW (ref 83–108)
pO2, Arterial: 119 mmHg — ABNORMAL HIGH (ref 83–108)

## 2021-08-16 LAB — CBC
HCT: 21.1 % — ABNORMAL LOW (ref 39.0–52.0)
HCT: 20.3 % — ABNORMAL LOW (ref 39.0–52.0)
HCT: 27.4 % — ABNORMAL LOW (ref 39.0–52.0)
Hemoglobin: 7 g/dL — ABNORMAL LOW (ref 13.0–17.0)
Hemoglobin: 6.4 g/dL — CL (ref 13.0–17.0)
Hemoglobin: 8.8 g/dL — ABNORMAL LOW (ref 13.0–17.0)
MCH: 30.7 pg (ref 26.0–34.0)
MCH: 29.9 pg (ref 26.0–34.0)
MCH: 30 pg (ref 26.0–34.0)
MCHC: 33.2 g/dL (ref 30.0–36.0)
MCHC: 31.5 g/dL (ref 30.0–36.0)
MCHC: 32.1 g/dL (ref 30.0–36.0)
MCV: 92.5 fL (ref 80.0–100.0)
MCV: 94.9 fL (ref 80.0–100.0)
MCV: 93.5 fL (ref 80.0–100.0)
Platelets: 141 10*3/uL — ABNORMAL LOW (ref 150–400)
Platelets: 150 10*3/uL (ref 150–400)
Platelets: 145 10*3/uL — ABNORMAL LOW (ref 150–400)
RBC: 2.28 MIL/uL — ABNORMAL LOW (ref 4.22–5.81)
RBC: 2.14 MIL/uL — ABNORMAL LOW (ref 4.22–5.81)
RBC: 2.93 MIL/uL — ABNORMAL LOW (ref 4.22–5.81)
RDW: 19.2 % — ABNORMAL HIGH (ref 11.5–15.5)
RDW: 20 % — ABNORMAL HIGH (ref 11.5–15.5)
RDW: 17.9 % — ABNORMAL HIGH (ref 11.5–15.5)
WBC: 19.7 10*3/uL — ABNORMAL HIGH (ref 4.0–10.5)
WBC: 16.4 10*3/uL — ABNORMAL HIGH (ref 4.0–10.5)
WBC: 16.2 10*3/uL — ABNORMAL HIGH (ref 4.0–10.5)
nRBC: 4.2 % — ABNORMAL HIGH (ref 0.0–0.2)
nRBC: 4.4 % — ABNORMAL HIGH (ref 0.0–0.2)
nRBC: 5.4 % — ABNORMAL HIGH (ref 0.0–0.2)

## 2021-08-16 LAB — GLUCOSE, CAPILLARY
Glucose-Capillary: 218 mg/dL — ABNORMAL HIGH (ref 70–99)
Glucose-Capillary: 222 mg/dL — ABNORMAL HIGH (ref 70–99)
Glucose-Capillary: 225 mg/dL — ABNORMAL HIGH (ref 70–99)
Glucose-Capillary: 239 mg/dL — ABNORMAL HIGH (ref 70–99)
Glucose-Capillary: 250 mg/dL — ABNORMAL HIGH (ref 70–99)
Glucose-Capillary: 254 mg/dL — ABNORMAL HIGH (ref 70–99)
Glucose-Capillary: 283 mg/dL — ABNORMAL HIGH (ref 70–99)

## 2021-08-16 LAB — HEPARIN LEVEL (UNFRACTIONATED)
Heparin Unfractionated: <0.1 IU/mL — ABNORMAL LOW (ref 0.30–0.70)
Heparin Unfractionated: 0.18 IU/mL — ABNORMAL LOW (ref 0.30–0.70)
Heparin Unfractionated: 0.12 IU/mL — ABNORMAL LOW (ref 0.30–0.70)

## 2021-08-16 LAB — PREPARE RBC (CROSSMATCH)

## 2021-08-16 LAB — PROTIME-INR
INR: 1.3 — ABNORMAL HIGH (ref 0.8–1.2)
Prothrombin Time: 15.6 seconds — ABNORMAL HIGH (ref 11.4–15.2)

## 2021-08-16 LAB — BASIC METABOLIC PANEL
Anion gap: 3 — ABNORMAL LOW (ref 5–15)
Anion gap: 6 (ref 5–15)
BUN: 34 mg/dL — ABNORMAL HIGH (ref 6–20)
BUN: 36 mg/dL — ABNORMAL HIGH (ref 6–20)
CO2: 24 mmol/L (ref 22–32)
CO2: 26 mmol/L (ref 22–32)
Calcium: 7.9 mg/dL — ABNORMAL LOW (ref 8.9–10.3)
Calcium: 7.9 mg/dL — ABNORMAL LOW (ref 8.9–10.3)
Chloride: 122 mmol/L — ABNORMAL HIGH (ref 98–111)
Chloride: 120 mmol/L — ABNORMAL HIGH (ref 98–111)
Creatinine, Ser: 0.79 mg/dL (ref 0.61–1.24)
Creatinine, Ser: 1.11 mg/dL (ref 0.61–1.24)
GFR, Estimated: >60 mL/min (ref >60)
GFR, Estimated: >60 mL/min (ref >60)
Glucose, Bld: 294 mg/dL — ABNORMAL HIGH (ref 70–99)
Glucose, Bld: 303 mg/dL — ABNORMAL HIGH (ref 70–99)
Potassium: 4.6 mmol/L (ref 3.5–5.1)
Potassium: 4.3 mmol/L (ref 3.5–5.1)
Sodium: 149 mmol/L — ABNORMAL HIGH (ref 135–145)
Sodium: 152 mmol/L — ABNORMAL HIGH (ref 135–145)

## 2021-08-16 LAB — APTT
aPTT: 45 seconds — ABNORMAL HIGH (ref 24–36)
aPTT: 52 seconds — ABNORMAL HIGH (ref 24–36)

## 2021-08-16 LAB — TRIGLYCERIDES: Triglycerides: 412 mg/dL — ABNORMAL HIGH (ref <150)

## 2021-08-16 LAB — LACTATE DEHYDROGENASE: LDH: 741 U/L — ABNORMAL HIGH (ref 98–192)

## 2021-08-16 LAB — HEPATIC FUNCTION PANEL
ALT: 39 U/L (ref 0–44)
AST: 42 U/L — ABNORMAL HIGH (ref 15–41)
Albumin: 1.8 g/dL — ABNORMAL LOW (ref 3.5–5.0)
Alkaline Phosphatase: 94 U/L (ref 38–126)
Bilirubin, Direct: 0.5 mg/dL — ABNORMAL HIGH (ref 0.0–0.2)
Indirect Bilirubin: 0.8 mg/dL (ref 0.3–0.9)
Total Bilirubin: 1.3 mg/dL — ABNORMAL HIGH (ref 0.3–1.2)
Total Protein: 4.2 g/dL — ABNORMAL LOW (ref 6.5–8.1)

## 2021-08-16 LAB — FIBRINOGEN: Fibrinogen: 345 mg/dL (ref 210–475)

## 2021-08-16 MED ORDER — QUETIAPINE FUMARATE 50 MG PO TABS
50.0000 mg | ORAL_TABLET | Freq: Two times a day (BID) | ORAL | Status: DC
  Administered 2021-08-16 – 2021-08-17 (×4): 50 mg
  Filled 2021-08-16 (×4): qty 1

## 2021-08-16 MED ORDER — FUROSEMIDE 10 MG/ML IJ SOLN
4.0000 mg/h | INTRAVENOUS | Status: DC
  Administered 2021-08-16 – 2021-08-17 (×2): 8 mg/h via INTRAVENOUS
  Administered 2021-08-18 – 2021-08-20 (×5): 12 mg/h via INTRAVENOUS
  Filled 2021-08-16 (×9): qty 20

## 2021-08-16 MED ORDER — FUROSEMIDE 10 MG/ML IJ SOLN
20.0000 mg | Freq: Once | INTRAMUSCULAR | Status: AC
Start: 2021-08-16 — End: 2021-08-16
  Administered 2021-08-16: 20 mg via INTRAVENOUS
  Filled 2021-08-16: qty 2

## 2021-08-16 MED ORDER — SODIUM CHLORIDE 0.9% IV SOLUTION: Freq: Once | INTRAVENOUS | Status: AC

## 2021-08-16 MED ORDER — INSULIN DETEMIR 100 UNIT/ML ~~LOC~~ SOLN
15.0000 [IU] | Freq: Two times a day (BID) | SUBCUTANEOUS | Status: DC
  Administered 2021-08-16 (×2): 15 [IU] via SUBCUTANEOUS
  Filled 2021-08-16 (×4): qty 0.15

## 2021-08-16 MED ORDER — "THROMBI-PAD 3""X3"" EX PADS"
1.0000 | MEDICATED_PAD | Freq: Once | CUTANEOUS | Status: AC
  Administered 2021-08-16: 1 via TOPICAL
  Filled 2021-08-16: qty 1

## 2021-08-16 NOTE — Progress Notes (Signed)
ECMO Sweep Trial   A sweep trial was started at 1015 per Dr. Gala Romney  Vent settings:  FiO2:  40% PEEP:  12      RR: 22  The following trial parameters have been discussed:  pH > 7.30, pCO2 < 45, pO2 > , SpO2 > 88%  Sweep trial on-going, parameters discussed, handoff given to Holland Falling RN, ECMO Specialist.  Jeff [name]    Latest Ref Rng & Units 08/16/2021    9:06 AM 08/16/2021   10:10 AM  ABG - Last 2 Results  PH, Arterial 7.35 - 7.45 7.388  7.352   PCO2 arterial 32 - 48 mmHg 42.2  47.8   PO2, Arterial 83 - 108 mmHg 139  145   Bicarbonate 20.0 - 28.0 mmol/L 25.5  26.5   Acid-Base Excess 0.0 - 2.0 mmol/L 0.0  1.0   O2 Saturation % 99  99
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
## 2021-08-16 NOTE — Progress Notes (Signed)
1915 sweep trial on-going, parameters discussed, handoff received from Aline August RN, ECMO Specialist.  2220 update given to Dr. Gala Romney and plan for shift discussed, pt continues to look good on sweep trial and ABG within parameters.   0320 pt continues to look good on sweep trial and ABG within parameters.   0700 sweep trial on-going, handoff given to Aline August RN, ECMO Specialist.
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
## 2021-08-17 ENCOUNTER — Inpatient Hospital Stay (HOSPITAL_COMMUNITY): Payer: Medicaid Other

## 2021-08-17 DIAGNOSIS — J80 Acute respiratory distress syndrome: Secondary | ICD-10-CM | POA: Diagnosis not present

## 2021-08-17 LAB — LACTATE DEHYDROGENASE: LDH: 782 U/L — ABNORMAL HIGH (ref 98–192)

## 2021-08-17 LAB — POCT I-STAT 7, (LYTES, BLD GAS, ICA,H+H)
Acid-Base Excess: 5 mmol/L — ABNORMAL HIGH (ref 0.0–2.0)
Acid-Base Excess: 5 mmol/L — ABNORMAL HIGH (ref 0.0–2.0)
Acid-Base Excess: 6 mmol/L — ABNORMAL HIGH (ref 0.0–2.0)
Bicarbonate: 29.7 mmol/L — ABNORMAL HIGH (ref 20.0–28.0)
Bicarbonate: 31 mmol/L — ABNORMAL HIGH (ref 20.0–28.0)
Bicarbonate: 31.6 mmol/L — ABNORMAL HIGH (ref 20.0–28.0)
Calcium, Ion: 1.25 mmol/L (ref 1.15–1.40)
Calcium, Ion: 1.23 mmol/L (ref 1.15–1.40)
Calcium, Ion: 1.21 mmol/L (ref 1.15–1.40)
HCT: 22 % — ABNORMAL LOW (ref 39.0–52.0)
HCT: 23 % — ABNORMAL LOW (ref 39.0–52.0)
HCT: 28 % — ABNORMAL LOW (ref 39.0–52.0)
Hemoglobin: 7.5 g/dL — ABNORMAL LOW (ref 13.0–17.0)
Hemoglobin: 7.8 g/dL — ABNORMAL LOW (ref 13.0–17.0)
Hemoglobin: 9.5 g/dL — ABNORMAL LOW (ref 13.0–17.0)
O2 Saturation: 98 %
O2 Saturation: 98 %
O2 Saturation: 95 %
Patient temperature: 36.9
Patient temperature: 37.6
Potassium: 4.1 mmol/L (ref 3.5–5.1)
Potassium: 4.1 mmol/L (ref 3.5–5.1)
Potassium: 4.2 mmol/L (ref 3.5–5.1)
Sodium: 151 mmol/L — ABNORMAL HIGH (ref 135–145)
Sodium: 149 mmol/L — ABNORMAL HIGH (ref 135–145)
Sodium: 150 mmol/L — ABNORMAL HIGH (ref 135–145)
TCO2: 31 mmol/L (ref 22–32)
TCO2: 32 mmol/L (ref 22–32)
TCO2: 33 mmol/L — ABNORMAL HIGH (ref 22–32)
pCO2 arterial: 45.7 mmHg (ref 32–48)
pCO2 arterial: 50.2 mmHg — ABNORMAL HIGH (ref 32–48)
pCO2 arterial: 52.1 mmHg — ABNORMAL HIGH (ref 32–48)
pH, Arterial: 7.42 (ref 7.35–7.45)
pH, Arterial: 7.398 (ref 7.35–7.45)
pH, Arterial: 7.393 (ref 7.35–7.45)
pO2, Arterial: 97 mmHg (ref 83–108)
pO2, Arterial: 100 mmHg (ref 83–108)
pO2, Arterial: 81 mmHg — ABNORMAL LOW (ref 83–108)

## 2021-08-17 LAB — BASIC METABOLIC PANEL
Anion gap: 5 (ref 5–15)
Anion gap: 4 — ABNORMAL LOW (ref 5–15)
BUN: 36 mg/dL — ABNORMAL HIGH (ref 6–20)
BUN: 40 mg/dL — ABNORMAL HIGH (ref 6–20)
CO2: 28 mmol/L (ref 22–32)
CO2: 32 mmol/L (ref 22–32)
Calcium: 8 mg/dL — ABNORMAL LOW (ref 8.9–10.3)
Calcium: 8.3 mg/dL — ABNORMAL LOW (ref 8.9–10.3)
Chloride: 117 mmol/L — ABNORMAL HIGH (ref 98–111)
Chloride: 115 mmol/L — ABNORMAL HIGH (ref 98–111)
Creatinine, Ser: 0.9 mg/dL (ref 0.61–1.24)
Creatinine, Ser: 1 mg/dL (ref 0.61–1.24)
GFR, Estimated: >60 mL/min (ref >60)
GFR, Estimated: >60 mL/min (ref >60)
Glucose, Bld: 264 mg/dL — ABNORMAL HIGH (ref 70–99)
Glucose, Bld: 248 mg/dL — ABNORMAL HIGH (ref 70–99)
Potassium: 4.1 mmol/L (ref 3.5–5.1)
Potassium: 4.2 mmol/L (ref 3.5–5.1)
Sodium: 150 mmol/L — ABNORMAL HIGH (ref 135–145)
Sodium: 151 mmol/L — ABNORMAL HIGH (ref 135–145)

## 2021-08-17 LAB — APTT: aPTT: 50 seconds — ABNORMAL HIGH (ref 24–36)

## 2021-08-17 LAB — HEPATIC FUNCTION PANEL
ALT: 49 U/L — ABNORMAL HIGH (ref 0–44)
AST: 47 U/L — ABNORMAL HIGH (ref 15–41)
Albumin: 1.8 g/dL — ABNORMAL LOW (ref 3.5–5.0)
Alkaline Phosphatase: 100 U/L (ref 38–126)
Bilirubin, Direct: 0.7 mg/dL — ABNORMAL HIGH (ref 0.0–0.2)
Indirect Bilirubin: 1 mg/dL — ABNORMAL HIGH (ref 0.3–0.9)
Total Bilirubin: 1.7 mg/dL — ABNORMAL HIGH (ref 0.3–1.2)
Total Protein: 5 g/dL — ABNORMAL LOW (ref 6.5–8.1)

## 2021-08-17 LAB — PROTIME-INR
INR: 1.2 (ref 0.8–1.2)
Prothrombin Time: 15.3 seconds — ABNORMAL HIGH (ref 11.4–15.2)

## 2021-08-17 LAB — GLUCOSE, CAPILLARY
Glucose-Capillary: 220 mg/dL — ABNORMAL HIGH (ref 70–99)
Glucose-Capillary: 235 mg/dL — ABNORMAL HIGH (ref 70–99)
Glucose-Capillary: 236 mg/dL — ABNORMAL HIGH (ref 70–99)
Glucose-Capillary: 238 mg/dL — ABNORMAL HIGH (ref 70–99)
Glucose-Capillary: 245 mg/dL — ABNORMAL HIGH (ref 70–99)
Glucose-Capillary: 267 mg/dL — ABNORMAL HIGH (ref 70–99)

## 2021-08-17 LAB — CBC
HCT: 25 % — ABNORMAL LOW (ref 39.0–52.0)
HCT: 31.9 % — ABNORMAL LOW (ref 39.0–52.0)
Hemoglobin: 8.3 g/dL — ABNORMAL LOW (ref 13.0–17.0)
Hemoglobin: 10.7 g/dL — ABNORMAL LOW (ref 13.0–17.0)
MCH: 30.2 pg (ref 26.0–34.0)
MCH: 30.4 pg (ref 26.0–34.0)
MCHC: 33.2 g/dL (ref 30.0–36.0)
MCHC: 33.5 g/dL (ref 30.0–36.0)
MCV: 90.9 fL (ref 80.0–100.0)
MCV: 90.6 fL (ref 80.0–100.0)
Platelets: 148 10*3/uL — ABNORMAL LOW (ref 150–400)
Platelets: 158 10*3/uL (ref 150–400)
RBC: 2.75 MIL/uL — ABNORMAL LOW (ref 4.22–5.81)
RBC: 3.52 MIL/uL — ABNORMAL LOW (ref 4.22–5.81)
RDW: 18.9 % — ABNORMAL HIGH (ref 11.5–15.5)
RDW: 17.8 % — ABNORMAL HIGH (ref 11.5–15.5)
WBC: 15.4 10*3/uL — ABNORMAL HIGH (ref 4.0–10.5)
WBC: 15.3 10*3/uL — ABNORMAL HIGH (ref 4.0–10.5)
nRBC: 5.4 % — ABNORMAL HIGH (ref 0.0–0.2)
nRBC: 7.1 % — ABNORMAL HIGH (ref 0.0–0.2)

## 2021-08-17 LAB — POCT ACTIVATED CLOTTING TIME: Activated Clotting Time: 155 seconds

## 2021-08-17 LAB — HEMOGLOBIN AND HEMATOCRIT, BLOOD
HCT: 32.8 % — ABNORMAL LOW (ref 39.0–52.0)
Hemoglobin: 10.7 g/dL — ABNORMAL LOW (ref 13.0–17.0)

## 2021-08-17 LAB — FIBRINOGEN: Fibrinogen: 319 mg/dL (ref 210–475)

## 2021-08-17 LAB — HEPARIN LEVEL (UNFRACTIONATED): Heparin Unfractionated: 0.13 IU/mL — ABNORMAL LOW (ref 0.30–0.70)

## 2021-08-17 LAB — PREPARE RBC (CROSSMATCH)

## 2021-08-17 MED ORDER — CLONAZEPAM 1 MG PO TABS
2.0000 mg | ORAL_TABLET | Freq: Two times a day (BID) | ORAL | Status: DC
  Administered 2021-08-17 – 2021-08-18 (×3): 2 mg
  Filled 2021-08-17 (×3): qty 2

## 2021-08-17 MED ORDER — "THROMBI-PAD 3""X3"" EX PADS"
1.0000 | MEDICATED_PAD | Freq: Once | CUTANEOUS | Status: AC
  Administered 2021-08-17: 1 via TOPICAL
  Filled 2021-08-17: qty 1

## 2021-08-17 MED ORDER — HEPARIN SODIUM (PORCINE) 5000 UNIT/ML IJ SOLN
5000.0000 [IU] | Freq: Three times a day (TID) | INTRAMUSCULAR | Status: DC
  Administered 2021-08-17: 5000 [IU] via SUBCUTANEOUS
  Filled 2021-08-17: qty 1

## 2021-08-17 MED ORDER — INSULIN ASPART 100 UNIT/ML IJ SOLN
6.0000 [IU] | INTRAMUSCULAR | Status: DC
  Administered 2021-08-17 – 2021-08-19 (×12): 6 [IU] via SUBCUTANEOUS

## 2021-08-17 MED ORDER — INSULIN DETEMIR 100 UNIT/ML ~~LOC~~ SOLN
25.0000 [IU] | Freq: Two times a day (BID) | SUBCUTANEOUS | Status: DC
  Administered 2021-08-17 – 2021-08-24 (×14): 25 [IU] via SUBCUTANEOUS
  Filled 2021-08-17 (×17): qty 0.25

## 2021-08-17 MED ORDER — ENOXAPARIN SODIUM 30 MG/0.3ML IJ SOSY
30.0000 mg | PREFILLED_SYRINGE | Freq: Two times a day (BID) | INTRAMUSCULAR | Status: DC
  Administered 2021-08-17 – 2021-08-28 (×22): 30 mg via SUBCUTANEOUS
  Filled 2021-08-17 (×22): qty 0.3

## 2021-08-17 MED ORDER — METOLAZONE 2.5 MG PO TABS
2.5000 mg | ORAL_TABLET | Freq: Once | ORAL | Status: AC
Start: 2021-08-17 — End: 2021-08-17
  Administered 2021-08-17: 2.5 mg
  Filled 2021-08-17: qty 1

## 2021-08-17 MED ORDER — INSULIN DETEMIR 100 UNIT/ML ~~LOC~~ SOLN
20.0000 [IU] | Freq: Two times a day (BID) | SUBCUTANEOUS | Status: DC
  Administered 2021-08-17: 20 [IU] via SUBCUTANEOUS
  Filled 2021-08-17 (×2): qty 0.2

## 2021-08-17 MED ORDER — SODIUM CHLORIDE 0.9% IV SOLUTION: Freq: Once | INTRAVENOUS | Status: AC

## 2021-08-17 MED ORDER — METOLAZONE 5 MG PO TABS
5.0000 mg | ORAL_TABLET | Freq: Once | ORAL | Status: AC
Start: 2021-08-17 — End: 2021-08-17
  Administered 2021-08-17: 5 mg
  Filled 2021-08-17: qty 1
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
## 2021-08-17 NOTE — Progress Notes (Signed)
Trauma/Critical Care Follow Up Note  Subjective:    Overnight Issues:   Objective:  Vital signs for last 24 hours: Temp:  [98.2 F (36.8 C)-99 F (37.2 C)] 98.6 F (37 C) (07/23 1000) Pulse Rate:  [82-115] 100 (07/23 1000) Resp:  [18-24] 24 (07/23 1000) BP: (115-136)/(54-62) 120/58 (07/23 0731) SpO2:  [87 %-100 %] 97 % (07/23 1000) Arterial Line BP: (90-148)/(42-65) 108/53 (07/23 1000) FiO2 (%):  [40 %] 40 % (07/23 0800) Weight:  [78.1 kg] 78.1 kg (07/23 0800)  Hemodynamic parameters for last 24 hours:    Intake/Output from previous day: 07/22 0701 - 07/23 0700 In: 4708 [I.V.:1139.1; Blood:630; NG/GT:2565; IV Piggyback:373.8] Out: 7545 [Urine:5540; Emesis/NG output:1550; Drains:145; Stool:200; Chest Tube:110]  Intake/Output this shift: Total I/O In: 682.2 [I.V.:212.2; NG/GT:470] Out: 650 [Urine:555; Emesis/NG output:75; Chest Tube:20]  Vent settings for last 24 hours: Vent Mode: PRVC FiO2 (%):  [40 %] 40 % Set Rate:  [24 bmp] 24 bmp Vt Set:  [540 mL] 540 mL PEEP:  [12 cmH20] 12 cmH20 Plateau Pressure:  [23 cmH20-26 cmH20] 24 cmH20  Physical Exam:  Gen: comfortable, no distress Neuro: sedated HEENT: PERRL Neck: supple CV: RRR Pulm: unlabored breathing Abd: soft, NT GU: clear yellow urine Extr: wwp, 2+   Results for orders placed or performed during the hospital encounter of 08/05/21 (from the past 24 hour(s))  Glucose, capillary     Status: Abnormal   Collection Time: 08/16/21 11:21 AM  Result Value Ref Range   Glucose-Capillary 283 (H) 70 - 99 mg/dL  I-STAT 7, (LYTES, BLD GAS, ICA, H+H)     Status: Abnormal   Collection Time: 08/16/21 11:24 AM  Result Value Ref Range   pH, Arterial 7.349 (L) 7.35 - 7.45   pCO2 arterial 48.1 (H) 32 - 48 mmHg   pO2, Arterial 106 83 - 108 mmHg   Bicarbonate 26.5 20.0 - 28.0 mmol/L   TCO2 28 22 - 32 mmol/L   O2 Saturation 98 %   Acid-Base Excess 1.0 0.0 - 2.0 mmol/L   Sodium 152 (H) 135 - 145 mmol/L   Potassium 4.3  3.5 - 5.1 mmol/L   Calcium, Ion 1.25 1.15 - 1.40 mmol/L   HCT 19.0 (L) 39.0 - 52.0 %   Hemoglobin 6.5 (LL) 13.0 - 17.0 g/dL   Patient temperature 74.9 C    Collection site art line    Drawn by Nurse    Sample type ARTERIAL    Comment NOTIFIED PHYSICIAN   I-STAT 7, (LYTES, BLD GAS, ICA, H+H)     Status: Abnormal   Collection Time: 08/16/21  1:58 PM  Result Value Ref Range   pH, Arterial 7.324 (L) 7.35 - 7.45   pCO2 arterial 51.2 (H) 32 - 48 mmHg   pO2, Arterial 88 83 - 108 mmHg   Bicarbonate 26.6 20.0 - 28.0 mmol/L   TCO2 28 22 - 32 mmol/L   O2 Saturation 96 %   Acid-Base Excess 1.0 0.0 - 2.0 mmol/L   Sodium 152 (H) 135 - 145 mmol/L   Potassium 4.5 3.5 - 5.1 mmol/L   Calcium, Ion 1.27 1.15 - 1.40 mmol/L   HCT 18.0 (L) 39.0 - 52.0 %   Hemoglobin 6.1 (LL) 13.0 - 17.0 g/dL   Collection site art line    Drawn by Nurse    Sample type ARTERIAL    Comment NOTIFIED PHYSICIAN   Heparin level (unfractionated)     Status: Abnormal   Collection Time: 08/16/21  2:00 PM  Result Value Ref Range   Heparin Unfractionated 0.18 (L) 0.30 - 0.70 IU/mL  CBC     Status: Abnormal   Collection Time: 08/16/21  2:00 PM  Result Value Ref Range   WBC 16.4 (H) 4.0 - 10.5 K/uL   RBC 2.14 (L) 4.22 - 5.81 MIL/uL   Hemoglobin 6.4 (LL) 13.0 - 17.0 g/dL   HCT 16.1 (L) 09.6 - 04.5 %   MCV 94.9 80.0 - 100.0 fL   MCH 29.9 26.0 - 34.0 pg   MCHC 31.5 30.0 - 36.0 g/dL   RDW 40.9 (H) 81.1 - 91.4 %   Platelets 150 150 - 400 K/uL   nRBC 4.4 (H) 0.0 - 0.2 %  Prepare RBC (crossmatch)     Status: None   Collection Time: 08/16/21  2:52 PM  Result Value Ref Range   Order Confirmation      ORDER PROCESSED BY BLOOD BANK ALREADY HAVE 4 UNITS AVAILABLE FROM A PREVIOUS ORDER. Performed at [facility], [address] [place], [place] [address]   I-STAT 7, (LYTES, BLD GAS, ICA, H+H)     Status: Abnormal   Collection Time: 08/16/21  3:00 PM  Result Value Ref Range   pH, Arterial 7.359 7.35 - 7.45   pCO2  arterial 48.8 (H) 32 - 48 mmHg   pO2, Arterial 128 (H) 83 - 108 mmHg   Bicarbonate 27.5 20.0 - 28.0 mmol/L   TCO2 29 22 - 32 mmol/L   O2 Saturation 99 %   Acid-Base Excess 2.0 0.0 - 2.0 mmol/L   Sodium 152 (H) 135 - 145 mmol/L   Potassium 4.3 3.5 - 5.1 mmol/L   Calcium, Ion 1.28 1.15 - 1.40 mmol/L   HCT 17.0 (L) 39.0 - 52.0 %   Hemoglobin 5.8 (LL) 13.0 - 17.0 g/dL   Collection site art line    Drawn by Nurse    Sample type ARTERIAL    Comment NOTIFIED PHYSICIAN   Glucose, capillary     Status: Abnormal   Collection Time: 08/16/21  3:53 PM  Result Value Ref Range   Glucose-Capillary 254 (H) 70 - 99 mg/dL  CBC     Status: Abnormal   Collection Time: 08/16/21  5:00 PM  Result Value Ref Range   WBC 16.2 (H) 4.0 - 10.5 K/uL   RBC 2.93 (L) 4.22 - 5.81 MIL/uL   Hemoglobin 8.8 (L) 13.0 - 17.0 g/dL   HCT 62.1 (L) 30.8 - 65.7 %   MCV 93.5 80.0 - 100.0 fL   MCH 30.0 26.0 - 34.0 pg   MCHC 32.1 30.0 - 36.0 g/dL   RDW 84.6 (H) 96.2 - 95.2 %   Platelets 145 (L) 150 - 400 K/uL   nRBC 5.4 (H) 0.0 - 0.2 %  Basic metabolic panel     Status: Abnormal   Collection Time: 08/16/21  5:00 PM  Result Value Ref Range   Sodium 152 (H) 135 - 145 mmol/L   Potassium 4.3 3.5 - 5.1 mmol/L   Chloride 120 (H) 98 - 111 mmol/L   CO2 26 22 - 32 mmol/L   Glucose, Bld 303 (H) 70 - 99 mg/dL   BUN 36 (H) 6 - 20 mg/dL   Creatinine, Ser 8.41 0.61 - 1.24 mg/dL   Calcium 7.9 (L) 8.9 - 10.3 mg/dL   GFR, Estimated >32 >44 mL/min   Anion gap 6 5 - 15  APTT     Status: Abnormal   Collection Time: 08/16/21  5:00  PM  Result Value Ref Range   aPTT 52 (H) 24 - 36 seconds  Heparin level (unfractionated)     Status: Abnormal   Collection Time: 08/16/21  5:00 PM  Result Value Ref Range   Heparin Unfractionated 0.12 (L) 0.30 - 0.70 IU/mL  I-STAT 7, (LYTES, BLD GAS, ICA, H+H)     Status: Abnormal   Collection Time: 08/16/21  5:51 PM  Result Value Ref Range   pH, Arterial 7.308 (L) 7.35 - 7.45   pCO2 arterial 53.6 (H)  32 - 48 mmHg   pO2, Arterial 67 (L) 83 - 108 mmHg   Bicarbonate 26.9 20.0 - 28.0 mmol/L   TCO2 29 22 - 32 mmol/L   O2 Saturation 91 %   Acid-Base Excess 0.0 0.0 - 2.0 mmol/L   Sodium 151 (H) 135 - 145 mmol/L   Potassium 4.2 3.5 - 5.1 mmol/L   Calcium, Ion 1.24 1.15 - 1.40 mmol/L   HCT 24.0 (L) 39.0 - 52.0 %   Hemoglobin 8.2 (L) 13.0 - 17.0 g/dL   Patient temperature 37.1 C    Sample type ARTERIAL   I-STAT 7, (LYTES, BLD GAS, ICA, H+H)     Status: Abnormal   Collection Time: 08/16/21  6:28 PM  Result Value Ref Range   pH, Arterial 7.344 (L) 7.35 - 7.45   pCO2 arterial 50.4 (H) 32 - 48 mmHg   pO2, Arterial 100 83 - 108 mmHg   Bicarbonate 27.5 20.0 - 28.0 mmol/L   TCO2 29 22 - 32 mmol/L   O2 Saturation 97 %   Acid-Base Excess 1.0 0.0 - 2.0 mmol/L   Sodium 152 (H) 135 - 145 mmol/L   Potassium 4.1 3.5 - 5.1 mmol/L   Calcium, Ion 1.25 1.15 - 1.40 mmol/L   HCT 22.0 (L) 39.0 - 52.0 %   Hemoglobin 7.5 (L) 13.0 - 17.0 g/dL   Collection site art line    Drawn by Nurse    Sample type ARTERIAL   Glucose, capillary     Status: Abnormal   Collection Time: 08/16/21  8:01 PM  Result Value Ref Range   Glucose-Capillary 222 (H) 70 - 99 mg/dL  I-STAT 7, (LYTES, BLD GAS, ICA, H+H)     Status: Abnormal   Collection Time: 08/16/21 10:21 PM  Result Value Ref Range   pH, Arterial 7.399 7.35 - 7.45   pCO2 arterial 45.5 32 - 48 mmHg   pO2, Arterial 119 (H) 83 - 108 mmHg   Bicarbonate 28.1 (H) 20.0 - 28.0 mmol/L   TCO2 30 22 - 32 mmol/L   O2 Saturation 99 %   Acid-Base Excess 3.0 (H) 0.0 - 2.0 mmol/L   Sodium 152 (H) 135 - 145 mmol/L   Potassium 3.9 3.5 - 5.1 mmol/L   Calcium, Ion 1.23 1.15 - 1.40 mmol/L   HCT 23.0 (L) 39.0 - 52.0 %   Hemoglobin 7.8 (L) 13.0 - 17.0 g/dL   Patient temperature 69.6 C    Collection site art line    Drawn by Operator    Sample type ARTERIAL   Glucose, capillary     Status: Abnormal   Collection Time: 08/16/21 11:28 PM  Result Value Ref Range    Glucose-Capillary 218 (H) 70 - 99 mg/dL  CBC     Status: Abnormal   Collection Time: 08/17/21  3:18 AM  Result Value Ref Range   WBC 15.4 (H) 4.0 - 10.5 K/uL   RBC 2.75 (L) 4.22 - 5.81 MIL/uL  Hemoglobin 8.3 (L) 13.0 - 17.0 g/dL   HCT 16.125.0 (L) 09.639.0 - 04.552.0 %   MCV 90.9 80.0 - 100.0 fL   MCH 30.2 26.0 - 34.0 pg   MCHC 33.2 30.0 - 36.0 g/dL   RDW 40.918.9 (H) 81.111.5 - 91.415.5 %   Platelets 148 (L) 150 - 400 K/uL   nRBC 5.4 (H) 0.0 - 0.2 %  Basic metabolic panel     Status: Abnormal   Collection Time: 08/17/21  3:18 AM  Result Value Ref Range   Sodium 150 (H) 135 - 145 mmol/L   Potassium 4.1 3.5 - 5.1 mmol/L   Chloride 117 (H) 98 - 111 mmol/L   CO2 28 22 - 32 mmol/L   Glucose, Bld 264 (H) 70 - 99 mg/dL   BUN 36 (H) 6 - 20 mg/dL   Creatinine, Ser 7.820.90 0.61 - 1.24 mg/dL   Calcium 8.0 (L) 8.9 - 10.3 mg/dL   GFR, Estimated >95>60 >62>60 mL/min   Anion gap 5 5 - 15  Lactate dehydrogenase     Status: Abnormal   Collection Time: 08/17/21  3:18 AM  Result Value Ref Range   LDH 782 (H) 98 - 192 U/L  Protime-INR     Status: Abnormal   Collection Time: 08/17/21  3:18 AM  Result Value Ref Range   Prothrombin Time 15.3 (H) 11.4 - 15.2 seconds   INR 1.2 0.8 - 1.2  Fibrinogen     Status: None   Collection Time: 08/17/21  3:18 AM  Result Value Ref Range   Fibrinogen 319 210 - 475 mg/dL  Hepatic function panel     Status: Abnormal   Collection Time: 08/17/21  3:18 AM  Result Value Ref Range   Total Protein 5.0 (L) 6.5 - 8.1 g/dL   Albumin 1.8 (L) 3.5 - 5.0 g/dL   AST 47 (H) 15 - 41 U/L   ALT 49 (H) 0 - 44 U/L   Alkaline Phosphatase 100 38 - 126 U/L   Total Bilirubin 1.7 (H) 0.3 - 1.2 mg/dL   Bilirubin, Direct 0.7 (H) 0.0 - 0.2 mg/dL   Indirect Bilirubin 1.0 (H) 0.3 - 0.9 mg/dL  APTT     Status: Abnormal   Collection Time: 08/17/21  3:18 AM  Result Value Ref Range   aPTT 50 (H) 24 - 36 seconds  Heparin level (unfractionated)     Status: Abnormal   Collection Time: 08/17/21  3:18 AM  Result Value  Ref Range   Heparin Unfractionated 0.13 (L) 0.30 - 0.70 IU/mL  Glucose, capillary     Status: Abnormal   Collection Time: 08/17/21  3:18 AM  Result Value Ref Range   Glucose-Capillary 245 (H) 70 - 99 mg/dL  I-STAT 7, (LYTES, BLD GAS, ICA, H+H)     Status: Abnormal   Collection Time: 08/17/21  3:20 AM  Result Value Ref Range   pH, Arterial 7.420 7.35 - 7.45   pCO2 arterial 45.7 32 - 48 mmHg   pO2, Arterial 97 83 - 108 mmHg   Bicarbonate 29.7 (H) 20.0 - 28.0 mmol/L   TCO2 31 22 - 32 mmol/L   O2 Saturation 98 %   Acid-Base Excess 5.0 (H) 0.0 - 2.0 mmol/L   Sodium 151 (H) 135 - 145 mmol/L   Potassium 4.1 3.5 - 5.1 mmol/L   Calcium, Ion 1.25 1.15 - 1.40 mmol/L   HCT 22.0 (L) 39.0 - 52.0 %   Hemoglobin 7.5 (L) 13.0 - 17.0 g/dL  Patient temperature 36.9 C    Sample type ARTERIAL   Glucose, capillary     Status: Abnormal   Collection Time: 08/17/21  8:24 AM  Result Value Ref Range   Glucose-Capillary 220 (H) 70 - 99 mg/dL  POCT Activated clotting time     Status: None   Collection Time: 08/17/21  9:06 AM  Result Value Ref Range   Activated Clotting Time 155 seconds  I-STAT 7, (LYTES, BLD GAS, ICA, H+H)     Status: Abnormal   Collection Time: 08/17/21  9:12 AM  Result Value Ref Range   pH, Arterial 7.398 7.35 - 7.45   pCO2 arterial 50.2 (H) 32 - 48 mmHg   pO2, Arterial 100 83 - 108 mmHg   Bicarbonate 31.0 (H) 20.0 - 28.0 mmol/L   TCO2 32 22 - 32 mmol/L   O2 Saturation 98 %   Acid-Base Excess 5.0 (H) 0.0 - 2.0 mmol/L   Sodium 149 (H) 135 - 145 mmol/L   Potassium 4.1 3.5 - 5.1 mmol/L   Calcium, Ion 1.23 1.15 - 1.40 mmol/L   HCT 23.0 (L) 39.0 - 52.0 %   Hemoglobin 7.8 (L) 13.0 - 17.0 g/dL   Collection site art line    Drawn by Nurse    Sample type ARTERIAL     Assessment & Plan: The plan of care was discussed at multi-disciplinary ECMO rounds for the day, who is in agreement with this plan and no additional concerns were raised.   Present on Admission:  Trauma of chest   ARDS (adult respiratory distress syndrome) ([name])  Contusion of left lung  Acute on chronic respiratory failure with hypoxia and hypercapnia ([name])  Critical polytrauma  TBI (traumatic brain injury) ([name])    LOS: 12 days   Additional comments:I reviewed the patient's new clinical lab test results.   and I reviewed the patients new imaging test results.    12M [facility]   SDH, IVH, SAH - NSGY c/s, Dr. Maurice Small, repeat CT head 7/12 some IVH but fairly stable, okay for above normal sodiums and for heparin as long as no bolus dosing. Head CT 7/16 with edema, but improvement in IVH. No midline shift. L rib fx 1-8 with L HPTX - L CT on suction, -20, WS later today Extensive pulmonary consolidation with refractory hypoxemia - cannulated for VV-ECMO 7/11, sweep trial since ~10a yest, plan for decannulation today Suspected aspiration - empiric meropenem, d/c today Fracture dislocation L elbow/olecranon and ulnar styloid with comminuted fracture of the left midshaft and distal humerus - initial eval by Dr. Linna Caprice, splinted, definitive care per Dr. Derinda Sis Trauma, OR 7/13 with Dr. Jena Gauss I&D, CR L humerus. OR 7/20 with Dr. Carola Frost for definitive repair Comminuted fracture dislocation R hip - reduced in TB, KI in place, traction pin 7/13 by Dr. Jena Gauss.  Will need closed management with traction due to proximity of soft tissue wound.  Complex lacerations RUE/RLE with degloving - operative washout and repair 7/13 by Dr. Jena Gauss, vac to RLE, proximity to R hip makes fixation high risk. OR for washout 7/18 with Dr. Carola Frost. WOC c/s for vac changes MWF, to start 7/24 R comminuted first metacarpal fracture/open - irrigated and splinted by Dr. Linna Caprice, hand surgery notified by EDP Coagulopathy - platelets 141 ID - recommend d/c of mero today Acute blood loss anemia  Swelling of L knee - XR today Shock - intermittently on and off low dose levo FEN - TF at 95/h, continue diuresis with lasix gtt DVT -  SCDs, heparin  gtt at 1K Dispo - ICU, ECMO decannulation   Critical Care Total Time: 40 minutes  Diamantina Monks, MD Trauma & General Surgery Please use [email or web address] to contact on call provider  08/17/2021  *Care during the described time interval was provided by me. I have reviewed this patient's available data, including medical history, events of note, physical examination and test results as part of my evaluation.

## 2021-08-17 NOTE — Progress Notes (Signed)
ECMO Sweep Trial   A sweep trial was started at 1015 08/16/21 per Dr. Gala Romney.  Received Report from Holland Falling RN, ECMO Specialist.  Patient remains on sweep trial at this time. Heparin gtt off at 0800; ACT 155 @ 0900 Decannulation plans discussed by team. Plan to decannulate after .  Baseline ABG drawn, see below.  Vent settings:  FiO2:  40% PEEP:  12  The following trial parameters have been discussed:  pH > 7.3, pCO2 < 55, pO2 > discuss with MD, SpO2 > 88%  Sweep trial passed, and patient was decannulated at 1040 .  ECMO/Trauma/CCM physicians notified.  Right IJ site without bleeding or hematoma.  CHG dressing applied by Dr. Gala Romney.  ABG    Latest Ref Rng & Units 08/17/2021    3:20 AM 08/17/2021    9:12 AM  ABG - Last 2 Results  PH, Arterial 7.35 - 7.45 7.420  7.398   PCO2 arterial 32 - 48 mmHg 45.7  50.2   PO2, Arterial 83 - 108 mmHg 97  100   Bicarbonate 20.0 - 28.0 mmol/L 29.7  31.0   Acid-Base Excess 0.0 - 2.0 mmol/L 5.0  5.0   O2 Saturation % 98  98
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
## 2021-08-17 NOTE — Plan of Care (Signed)
  Problem: Clinical Measurements: Goal: Ability to maintain clinical measurements within normal limits will improve Outcome: Progressing Goal: Will remain free from infection Outcome: Progressing Goal: Diagnostic test results will improve Outcome: Progressing Goal: Cardiovascular complication will be avoided Outcome: Progressing   Problem: Nutrition: Goal: Adequate nutrition will be maintained Outcome: Progressing   Problem: Elimination: Goal: Will not experience complications related to bowel motility Outcome: Progressing   Problem: Safety: Goal: Ability to remain free from injury will improve Outcome: Progressing   Problem: Skin Integrity: Goal: Risk for impaired skin integrity will decrease Outcome: Progressing

## 2021-08-17 NOTE — CV Procedure (Signed)
  VV ECMO Decannulation Note  Heparin held for > 90 minutes. ACT < 150.   Timeout performed. Dressing removed from cannula site. Previous sutures removed. Site cleaned sterilely.   Two 0-silk pursestring sutures placed around exit site and tied down loosely.   ECMO circuit clamped out. ECMO cannula removed and pursestring sutures cinched down tightly. Immediate hemostasis obtained. Manual pressure held for 15 minutes. Site stable. Given 1u RBC for blood lost in circuit.   Sterile dressing placed.   No immediate complications.  Arvilla Meres, MD  11:16 AM

## 2021-08-18 ENCOUNTER — Inpatient Hospital Stay: Payer: Self-pay

## 2021-08-18 ENCOUNTER — Inpatient Hospital Stay (HOSPITAL_COMMUNITY): Payer: Medicaid Other

## 2021-08-18 DIAGNOSIS — J80 Acute respiratory distress syndrome: Secondary | ICD-10-CM | POA: Diagnosis not present

## 2021-08-18 DIAGNOSIS — J9601 Acute respiratory failure with hypoxia: Secondary | ICD-10-CM

## 2021-08-18 DIAGNOSIS — T1490XA Injury, unspecified, initial encounter: Secondary | ICD-10-CM | POA: Diagnosis not present

## 2021-08-18 DIAGNOSIS — S299XXA Unspecified injury of thorax, initial encounter: Secondary | ICD-10-CM | POA: Diagnosis not present

## 2021-08-18 DIAGNOSIS — Z7189 Other specified counseling: Secondary | ICD-10-CM

## 2021-08-18 DIAGNOSIS — J9621 Acute and chronic respiratory failure with hypoxia: Secondary | ICD-10-CM | POA: Diagnosis not present

## 2021-08-18 LAB — BPAM RBC
Blood Product Expiration Date: 202308092359
Blood Product Expiration Date: 202308102359
Blood Product Expiration Date: 202308112359
Blood Product Expiration Date: 202308112359
Blood Product Expiration Date: 202308112359
Blood Product Expiration Date: 202308142359
Blood Product Expiration Date: 202308142359
ISSUE DATE / TIME: 202307210238
ISSUE DATE / TIME: 202307221514
ISSUE DATE / TIME: 202307221514
ISSUE DATE / TIME: 202307231051
ISSUE DATE / TIME: 202307231051
Unit Type and Rh: 6200
Unit Type and Rh: 6200
Unit Type and Rh: 6200
Unit Type and Rh: 6200
Unit Type and Rh: 6200
Unit Type and Rh: 6200
Unit Type and Rh: 6200

## 2021-08-18 LAB — POCT I-STAT 7, (LYTES, BLD GAS, ICA,H+H)
Acid-Base Excess: 11 mmol/L — ABNORMAL HIGH (ref 0.0–2.0)
Bicarbonate: 35.9 mmol/L — ABNORMAL HIGH (ref 20.0–28.0)
Calcium, Ion: 1.15 mmol/L (ref 1.15–1.40)
HCT: 35 % — ABNORMAL LOW (ref 39.0–52.0)
Hemoglobin: 11.9 g/dL — ABNORMAL LOW (ref 13.0–17.0)
O2 Saturation: 97 %
Patient temperature: 102
Potassium: 3.7 mmol/L (ref 3.5–5.1)
Sodium: 146 mmol/L — ABNORMAL HIGH (ref 135–145)
TCO2: 37 mmol/L — ABNORMAL HIGH (ref 22–32)
pCO2 arterial: 49.4 mmHg — ABNORMAL HIGH (ref 32–48)
pH, Arterial: 7.477 — ABNORMAL HIGH (ref 7.35–7.45)
pO2, Arterial: 91 mmHg (ref 83–108)

## 2021-08-18 LAB — TYPE AND SCREEN
ABO/RH(D): A POS
Antibody Screen: NEGATIVE
Unit division: 0
Unit division: 0
Unit division: 0
Unit division: 0
Unit division: 0
Unit division: 0
Unit division: 0

## 2021-08-18 LAB — GLUCOSE, CAPILLARY
Glucose-Capillary: 172 mg/dL — ABNORMAL HIGH (ref 70–99)
Glucose-Capillary: 204 mg/dL — ABNORMAL HIGH (ref 70–99)
Glucose-Capillary: 208 mg/dL — ABNORMAL HIGH (ref 70–99)
Glucose-Capillary: 213 mg/dL — ABNORMAL HIGH (ref 70–99)
Glucose-Capillary: 213 mg/dL — ABNORMAL HIGH (ref 70–99)
Glucose-Capillary: 242 mg/dL — ABNORMAL HIGH (ref 70–99)

## 2021-08-18 LAB — CBC
HCT: 35.7 % — ABNORMAL LOW (ref 39.0–52.0)
HCT: 34.5 % — ABNORMAL LOW (ref 39.0–52.0)
Hemoglobin: 11.6 g/dL — ABNORMAL LOW (ref 13.0–17.0)
Hemoglobin: 11.2 g/dL — ABNORMAL LOW (ref 13.0–17.0)
MCH: 29.7 pg (ref 26.0–34.0)
MCH: 29.9 pg (ref 26.0–34.0)
MCHC: 32.5 g/dL (ref 30.0–36.0)
MCHC: 32.5 g/dL (ref 30.0–36.0)
MCV: 91.5 fL (ref 80.0–100.0)
MCV: 92 fL (ref 80.0–100.0)
Platelets: 233 10*3/uL (ref 150–400)
Platelets: 264 10*3/uL (ref 150–400)
RBC: 3.9 MIL/uL — ABNORMAL LOW (ref 4.22–5.81)
RBC: 3.75 MIL/uL — ABNORMAL LOW (ref 4.22–5.81)
RDW: 18.6 % — ABNORMAL HIGH (ref 11.5–15.5)
RDW: 18.6 % — ABNORMAL HIGH (ref 11.5–15.5)
WBC: 16.6 10*3/uL — ABNORMAL HIGH (ref 4.0–10.5)
WBC: 13.9 10*3/uL — ABNORMAL HIGH (ref 4.0–10.5)
nRBC: 6.7 % — ABNORMAL HIGH (ref 0.0–0.2)
nRBC: 3 % — ABNORMAL HIGH (ref 0.0–0.2)

## 2021-08-18 LAB — BASIC METABOLIC PANEL
Anion gap: 11 (ref 5–15)
Anion gap: 11 (ref 5–15)
BUN: 44 mg/dL — ABNORMAL HIGH (ref 6–20)
BUN: 56 mg/dL — ABNORMAL HIGH (ref 6–20)
CO2: 32 mmol/L (ref 22–32)
CO2: 33 mmol/L — ABNORMAL HIGH (ref 22–32)
Calcium: 8.5 mg/dL — ABNORMAL LOW (ref 8.9–10.3)
Calcium: 8.4 mg/dL — ABNORMAL LOW (ref 8.9–10.3)
Chloride: 104 mmol/L (ref 98–111)
Chloride: 102 mmol/L (ref 98–111)
Creatinine, Ser: 1.13 mg/dL (ref 0.61–1.24)
Creatinine, Ser: 1.3 mg/dL — ABNORMAL HIGH (ref 0.61–1.24)
GFR, Estimated: >60 mL/min (ref >60)
GFR, Estimated: >60 mL/min (ref >60)
Glucose, Bld: 209 mg/dL — ABNORMAL HIGH (ref 70–99)
Glucose, Bld: 200 mg/dL — ABNORMAL HIGH (ref 70–99)
Potassium: 3.8 mmol/L (ref 3.5–5.1)
Potassium: 3.5 mmol/L (ref 3.5–5.1)
Sodium: 147 mmol/L — ABNORMAL HIGH (ref 135–145)
Sodium: 146 mmol/L — ABNORMAL HIGH (ref 135–145)

## 2021-08-18 LAB — URINALYSIS, ROUTINE W REFLEX MICROSCOPIC
Bilirubin Urine: NEGATIVE
Glucose, UA: NEGATIVE mg/dL
Ketones, ur: NEGATIVE mg/dL
Nitrite: NEGATIVE
Protein, ur: NEGATIVE mg/dL
Specific Gravity, Urine: 1.014 (ref 1.005–1.030)
pH: 5 (ref 5.0–8.0)

## 2021-08-18 MED ORDER — POTASSIUM CHLORIDE 20 MEQ PO PACK
40.0000 meq | PACK | Freq: Once | ORAL | Status: AC
  Administered 2021-08-18: 40 meq
  Filled 2021-08-18: qty 2

## 2021-08-18 MED ORDER — QUETIAPINE FUMARATE 100 MG PO TABS
100.0000 mg | ORAL_TABLET | Freq: Two times a day (BID) | ORAL | Status: DC
  Administered 2021-08-18 (×2): 100 mg
  Filled 2021-08-18 (×2): qty 1

## 2021-08-18 MED ORDER — LACTATED RINGERS IV BOLUS
1000.0000 mL | Freq: Once | INTRAVENOUS | Status: AC
  Administered 2021-08-18: 1000 mL via INTRAVENOUS

## 2021-08-18 MED ORDER — PIVOT 1.5 CAL PO LIQD
1000.0000 mL | ORAL | Status: DC
Start: 2021-08-18 — End: 2021-08-25
  Administered 2021-08-18 – 2021-08-24 (×8): 1000 mL

## 2021-08-18 MED ORDER — SODIUM CHLORIDE 0.9 % IV SOLN
2.0000 g | Freq: Three times a day (TID) | INTRAVENOUS | Status: DC
  Administered 2021-08-18 – 2021-08-21 (×9): 2 g via INTRAVENOUS
  Filled 2021-08-18 (×9): qty 12.5

## 2021-08-18 MED ORDER — JUVEN PO PACK
1.0000 | PACK | Freq: Two times a day (BID) | ORAL | Status: DC
  Administered 2021-08-18 – 2021-09-04 (×33): 1
  Filled 2021-08-18 (×32): qty 1

## 2021-08-18 MED ORDER — METOPROLOL TARTRATE 25 MG/10 ML ORAL SUSPENSION
25.0000 mg | Freq: Two times a day (BID) | ORAL | Status: DC
Start: 2021-08-18 — End: 2021-08-21
  Administered 2021-08-18 – 2021-08-20 (×6): 25 mg
  Filled 2021-08-18 (×6): qty 10

## 2021-08-18 MED ORDER — OXYCODONE HCL 5 MG/5ML PO SOLN
10.0000 mg | ORAL | Status: DC
  Administered 2021-08-18 – 2021-08-20 (×13): 10 mg
  Filled 2021-08-18 (×13): qty 10

## 2021-08-18 MED ORDER — MIDAZOLAM HCL 2 MG/2ML IJ SOLN
2.0000 mg | Freq: Once | INTRAMUSCULAR | Status: AC
  Administered 2021-08-18: 2 mg via INTRAVENOUS
  Filled 2021-08-18: qty 2
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
## 2021-08-18 NOTE — Progress Notes (Signed)
PICC consult: Pt evaluated for PICC insertion. Not a candidate for peripheral insertion due to surgical sites in BUE. Please consider subclavian or tunneled catheter for prolonged infusions.

## 2021-08-18 NOTE — Progress Notes (Signed)
Patient ID: Jeff [name], male   DOB: Jul 15, 2000, 21 y.o.   MRN: [id number] Follow up - Trauma Critical Care   Patient Details:    Jeff [name] is an 21 y.o. male.  Lines/tubes : CVC Triple Lumen 08/07/21 Left Internal jugular (Active)  Indication for Insertion or Continuance of Line Vasoactive infusions 08/17/21 2000  Site Assessment Clean, Dry, Intact 08/17/21 2000  Proximal Lumen Status Infusing 08/17/21 2000  Medial Lumen Status Infusing 08/17/21 2000  Distal Lumen Status Saline locked;No blood return;Flushed 08/17/21 2000  Dressing Type Transparent 08/17/21 2000  Dressing Status Antimicrobial disc in place;Clean, Dry, Intact 08/17/21 2000  Line Care Connections checked and tightened 08/17/21 0800  Dressing Intervention Dressing changed;Antimicrobial disc changed 08/17/21 1200  Dressing Change Due 08/24/21 08/17/21 2000     Arterial Line 08/05/21 Left Femoral (Active)  Site Assessment Clean, Dry, Intact 08/17/21 2000  Line Status Pulsatile blood flow 08/17/21 2000  Art Line Waveform Appropriate;Square wave test performed 08/17/21 2000  Art Line Interventions Leveled;Zeroed and calibrated 08/17/21 2000  Color/Movement/Sensation Capillary refill less than 3 sec 08/17/21 2000  Dressing Type Transparent 08/17/21 2000  Dressing Status Clean, Dry, Intact;Antimicrobial disc in place 08/17/21 2000  Interventions Dressing changed;Antimicrobial disc changed 08/16/21 1700  Dressing Change Due 08/23/21 08/17/21 2000     Chest Tube 1 Lateral;Left Pleural (Active)  Status -20 cm H2O 08/17/21 2000  Chest Tube Air Leak None 08/17/21 2000  Patency Intervention Tip/tilt 08/17/21 2000  Drainage Description Serosanguineous 08/17/21 2000  Dressing Status Clean, Dry, Intact 08/17/21 2000  Dressing Intervention Dressing changed 08/16/21 1600  Site Assessment Other (Comment) 08/17/21 0800  Surrounding Skin Unable to view 08/17/21 0800  Intake (mL) 0 mL 08/14/21 2000  Output (mL)  50 mL 08/18/21 0600     Negative Pressure Wound Therapy Thigh Right;Upper (Active)  Wound Image      08/09/21 2000  Last dressing change 08/07/21 08/08/21 2000  Site / Wound Assessment Dressing in place / Unable to assess 08/17/21 0800  Peri-wound Assessment Erythema (non-blanchable) 08/13/21 2000  Cycle Continuous 08/17/21 0800  Target Pressure (mmHg) 125 08/17/21 0800  Instillation Volume 0 mL 08/13/21 0818  Canister Changed Yes 08/14/21 0800  Machine plugged into wall outlet (NOT bed outlet) Yes 08/17/21 0800  Dressing Status Intact 08/15/21 2000  Drainage Amount Minimal 08/15/21 2000  Drainage Description Sanguineous 08/15/21 2000  Output (mL) 50 mL 08/18/21 0500     Flatus Tube/Pouch (Active)  Daily care Skin around tube assessed 08/17/21 2000  Output (mL) 150 mL 08/18/21 0500  Intake (mL) 30 mL 08/11/21 1700     Urethral Catheter Lurena Joinerebecca M Sarine Straight-tip 16 Fr. (Active)  Indication for Insertion or Continuance of Catheter Unstable spinal/crush injuries / Multisystem Trauma 08/18/21 0115  Site Assessment Clean, Dry, Intact 08/18/21 0115  Catheter Maintenance Bag below level of bladder;Catheter secured;Drainage bag/tubing not touching floor;Insertion date on drainage bag;No dependent loops;Seal intact 08/18/21 0115  Collection Container Leg bag 08/18/21 0115  Securement Method Securing device (Describe) 08/18/21 0115  Output (mL) 400 mL 08/18/21 0500    Microbiology/Sepsis markers: Results for orders placed or performed during the hospital encounter of 08/05/21  MRSA Next Gen by PCR, Nasal     Status: None   Collection Time: 08/06/21  2:58 AM   Specimen: Nasal Mucosa; Nasal Swab  Result Value Ref Range Status   MRSA by PCR Next Gen NOT DETECTED NOT DETECTED Final    Comment: (NOTE) The GeneXpert MRSA Assay (FDA approved for  NASAL specimens only), is one component of a comprehensive MRSA colonization surveillance program. It is not intended to diagnose MRSA infection  nor to guide or monitor treatment for MRSA infections. Test performance is not FDA approved in patients less than 54 years old. Performed at [facility], [address] [place], [place] [address]   Resp Panel by RT-PCR (Flu A&B, Covid) Anterior Nasal Swab     Status: None   Collection Time: 08/06/21  3:45 AM   Specimen: Anterior Nasal Swab  Result Value Ref Range Status   SARS Coronavirus 2 by RT PCR NEGATIVE NEGATIVE Final    Comment: (NOTE) SARS-CoV-2 target nucleic acids are NOT DETECTED.  The SARS-CoV-2 RNA is generally detectable in upper respiratory specimens during the acute phase of infection. The lowest concentration of SARS-CoV-2 viral copies this assay can detect is 138 copies/mL. A negative result does not preclude SARS-Cov-2 infection and should not be used as the sole basis for treatment or other patient management decisions. A negative result may occur with  improper specimen collection/handling, submission of specimen other than nasopharyngeal swab, presence of viral mutation(s) within the areas targeted by this assay, and inadequate number of viral copies(<138 copies/mL). A negative result must be combined with clinical observations, patient history, and epidemiological information. The expected result is Negative.  Fact Sheet for Patients:  BloggerCourse.com  Fact Sheet for Healthcare Providers:  SeriousBroker.it  This test is no t yet approved or cleared by the [place] FDA and  has been authorized for detection and/or diagnosis of SARS-CoV-2 by FDA under an Emergency Use Authorization (EUA). This EUA will remain  in effect (meaning this test can be used) for the duration of the COVID-19 declaration under Section 564(b)(1) of the Act, 21 U.S.C.section 360bbb-3(b)(1), unless the authorization is terminated  or revoked sooner.       Influenza A by PCR NEGATIVE NEGATIVE Final   Influenza B by PCR  NEGATIVE NEGATIVE Final    Comment: (NOTE) The Xpert Xpress SARS-CoV-2/FLU/RSV plus assay is intended as an aid in the diagnosis of influenza from Nasopharyngeal swab specimens and should not be used as a sole basis for treatment. Nasal washings and aspirates are unacceptable for Xpert Xpress SARS-CoV-2/FLU/RSV testing.  Fact Sheet for Patients: BloggerCourse.com  Fact Sheet for Healthcare Providers: SeriousBroker.it  This test is not yet approved or cleared by the [place] FDA and has been authorized for detection and/or diagnosis of SARS-CoV-2 by FDA under an Emergency Use Authorization (EUA). This EUA will remain in effect (meaning this test can be used) for the duration of the COVID-19 declaration under Section 564(b)(1) of the Act, 21 U.S.C. section 360bbb-3(b)(1), unless the authorization is terminated or revoked.  Performed at [facility], [address], [place], [place] [address]   Culture, Respiratory w Gram Stain     Status: None   Collection Time: 08/07/21  8:01 PM   Specimen: Tracheal Aspirate; Respiratory  Result Value Ref Range Status   Specimen Description TRACHEAL ASPIRATE  Final   Special Requests NONE  Final   Gram Stain   Final    RARE WBC PRESENT,BOTH PMN AND MONONUCLEAR NO ORGANISMS SEEN    Culture   Final    NO GROWTH 2 DAYS Performed at [facility], [address] [place], [place] [address]    Report Status 08/10/2021 FINAL  Final    Anti-infectives:  Anti-infectives (From admission, onward)    Start     Dose/Rate  Route Frequency Ordered Stop   08/13/21 2000  vancomycin (VANCOREADY) IVPB 1500 mg/300 mL  Status:  Discontinued        1,500 mg 150 mL/hr over 120 Minutes Intravenous Every 12 hours 08/13/21 1242 08/15/21 0952   08/10/21 1000  vancomycin (VANCOREADY) IVPB 1500 mg/300 mL  Status:  Discontinued        1,500 mg 150 mL/hr over 120 Minutes Intravenous Every 24 hours  08/09/21 0820 08/13/21 1242   08/09/21 0915  vancomycin (VANCOREADY) IVPB 2000 mg/400 mL        2,000 mg 200 mL/hr over 120 Minutes Intravenous  Once 08/09/21 0820 08/09/21 1050   08/09/21 0830  vancomycin (VANCOREADY) IVPB 1750 mg/350 mL  Status:  Discontinued        1,750 mg 175 mL/hr over 120 Minutes Intravenous  Once 08/09/21 0733 08/09/21 0820   08/07/21 1105  tobramycin (NEBCIN) powder  Status:  Discontinued          As needed 08/07/21 1106 08/07/21 1224   08/07/21 1104  vancomycin (VANCOCIN) powder  Status:  Discontinued          As needed 08/07/21 1105 08/07/21 1224   08/06/21 1000  vancomycin (VANCOREADY) IVPB 750 mg/150 mL  Status:  Discontinued        750 mg 150 mL/hr over 60 Minutes Intravenous Every 12 hours 08/05/21 2229 08/05/21 2313   08/06/21 0600  meropenem (MERREM) 1 g in sodium chloride 0.9 % 100 mL IVPB  Status:  Discontinued        1 g 200 mL/hr over 30 Minutes Intravenous Every 8 hours 08/05/21 2352 08/17/21 1043   08/06/21 0015  vancomycin (VANCOREADY) IVPB 750 mg/150 mL        750 mg 150 mL/hr over 60 Minutes Intravenous  Once 08/05/21 2313 08/06/21 0208   08/05/21 2317  vancomycin variable dose per unstable renal function (pharmacist dosing)  Status:  Discontinued         Does not apply See admin instructions 08/05/21 2317 08/06/21 0917   08/05/21 2215  Ampicillin-Sulbactam (UNASYN) 3 g in sodium chloride 0.9 % 100 mL IVPB  Status:  Discontinued        3 g 200 mL/hr over 30 Minutes Intravenous Every 6 hours 08/05/21 2209 08/05/21 2351   08/05/21 2215  vancomycin (VANCOCIN) IVPB 1000 mg/200 mL premix        1,000 mg 200 mL/hr over 60 Minutes Intravenous  Once 08/05/21 2213 08/05/21 2217   08/05/21 1815  ceFAZolin (ANCEF) IVPB 2g/100 mL premix        2 g 200 mL/hr over 30 Minutes Intravenous NOW 08/05/21 1801 08/05/21 1936        Consults: Treatment Team:  Jadene Pierini, MD Haddix, Gillie Manners, MD    Studies:    Events:  Subjective:     Overnight Issues:   Objective:  Vital signs for last 24 hours: Temp:  [98.1 F (36.7 C)-103 F (39.4 C)] 103 F (39.4 C) (07/24 0600) Pulse Rate:  [90-160] 147 (07/24 0700) Resp:  [20-28] 24 (07/24 0700) BP: (117)/(60) 117/60 (07/23 1520) SpO2:  [93 %-100 %] 96 % (07/24 0700) Arterial Line BP: (93-165)/(47-80) 151/73 (07/24 0700) FiO2 (%):  [40 %] 40 % (07/24 0302) Weight:  [78.1 kg] 78.1 kg (07/24 0600)  Hemodynamic parameters for last 24 hours:    Intake/Output from previous day: 07/23 0701 - 07/24 0700 In: 5845.5 [I.V.:898; Blood:635; NG/GT:3903.8; IV Piggyback:408.7] Out: 7245 [Urine:6210; Emesis/NG output:75; Drains:150; Stool:700;  Chest Tube:110]  Intake/Output this shift: No intake/output data recorded.  Vent settings for last 24 hours: Vent Mode: PRVC FiO2 (%):  [40 %] 40 % Set Rate:  [24 bmp] 24 bmp Vt Set:  [540 mL] 540 mL PEEP:  [12 cmH20] 12 cmH20 Plateau Pressure:  [21 cmH20-27 cmH20] 21 cmH20  Physical Exam:  General: sedated Neuro: eye flutter to stim HEENT/Neck: trach-clean, intact Resp: fewr honchi B CVS: tachy 140 GI: soft Extremities: ortho dressings, TXN RLE  Results for orders placed or performed during the hospital encounter of 08/05/21 (from the past 24 hour(s))  Glucose, capillary     Status: Abnormal   Collection Time: 08/17/21  8:24 AM  Result Value Ref Range   Glucose-Capillary 220 (H) 70 - 99 mg/dL  POCT Activated clotting time     Status: None   Collection Time: 08/17/21  9:06 AM  Result Value Ref Range   Activated Clotting Time 155 seconds  I-STAT 7, (LYTES, BLD GAS, ICA, H+H)     Status: Abnormal   Collection Time: 08/17/21  9:12 AM  Result Value Ref Range   pH, Arterial 7.398 7.35 - 7.45   pCO2 arterial 50.2 (H) 32 - 48 mmHg   pO2, Arterial 100 83 - 108 mmHg   Bicarbonate 31.0 (H) 20.0 - 28.0 mmol/L   TCO2 32 22 - 32 mmol/L   O2 Saturation 98 %   Acid-Base Excess 5.0 (H) 0.0 - 2.0 mmol/L   Sodium 149 (H) 135 - 145 mmol/L    Potassium 4.1 3.5 - 5.1 mmol/L   Calcium, Ion 1.23 1.15 - 1.40 mmol/L   HCT 23.0 (L) 39.0 - 52.0 %   Hemoglobin 7.8 (L) 13.0 - 17.0 g/dL   Collection site art line    Drawn by Nurse    Sample type ARTERIAL   Prepare RBC (crossmatch)     Status: None   Collection Time: 08/17/21 10:41 AM  Result Value Ref Range   Order Confirmation      ORDER PROCESSED BY BLOOD BANK Performed at [facility], [address], [place], [place] [address]   Glucose, capillary     Status: Abnormal   Collection Time: 08/17/21  1:16 PM  Result Value Ref Range   Glucose-Capillary 235 (H) 70 - 99 mg/dL  I-STAT 7, (LYTES, BLD GAS, ICA, H+H)     Status: Abnormal   Collection Time: 08/17/21  1:18 PM  Result Value Ref Range   pH, Arterial 7.393 7.35 - 7.45   pCO2 arterial 52.1 (H) 32 - 48 mmHg   pO2, Arterial 81 (L) 83 - 108 mmHg   Bicarbonate 31.6 (H) 20.0 - 28.0 mmol/L   TCO2 33 (H) 22 - 32 mmol/L   O2 Saturation 95 %   Acid-Base Excess 6.0 (H) 0.0 - 2.0 mmol/L   Sodium 150 (H) 135 - 145 mmol/L   Potassium 4.2 3.5 - 5.1 mmol/L   Calcium, Ion 1.21 1.15 - 1.40 mmol/L   HCT 28.0 (L) 39.0 - 52.0 %   Hemoglobin 9.5 (L) 13.0 - 17.0 g/dL   Patient temperature 60.4 C    Sample type ARTERIAL   Hemoglobin and hematocrit, blood     Status: Abnormal   Collection Time: 08/17/21  1:45 PM  Result Value Ref Range   Hemoglobin 10.7 (L) 13.0 - 17.0 g/dL   HCT 54.0 (L) 98.1 - 19.1 %  Glucose, capillary     Status: Abnormal   Collection Time: 08/17/21  4:15 PM  Result Value Ref Range   Glucose-Capillary 236 (H) 70 - 99 mg/dL  CBC     Status: Abnormal   Collection Time: 08/17/21  4:30 PM  Result Value Ref Range   WBC 15.3 (H) 4.0 - 10.5 K/uL   RBC 3.52 (L) 4.22 - 5.81 MIL/uL   Hemoglobin 10.7 (L) 13.0 - 17.0 g/dL   HCT 28.7 (L) 86.7 - 67.2 %   MCV 90.6 80.0 - 100.0 fL   MCH 30.4 26.0 - 34.0 pg   MCHC 33.5 30.0 - 36.0 g/dL   RDW 09.4 (H) 70.9 - 62.8 %   Platelets 158 150 - 400 K/uL   nRBC 7.1 (H) 0.0 -  0.2 %  Basic metabolic panel     Status: Abnormal   Collection Time: 08/17/21  4:30 PM  Result Value Ref Range   Sodium 151 (H) 135 - 145 mmol/L   Potassium 4.2 3.5 - 5.1 mmol/L   Chloride 115 (H) 98 - 111 mmol/L   CO2 32 22 - 32 mmol/L   Glucose, Bld 248 (H) 70 - 99 mg/dL   BUN 40 (H) 6 - 20 mg/dL   Creatinine, Ser 3.66 0.61 - 1.24 mg/dL   Calcium 8.3 (L) 8.9 - 10.3 mg/dL   GFR, Estimated >29 >47 mL/min   Anion gap 4 (L) 5 - 15  Glucose, capillary     Status: Abnormal   Collection Time: 08/17/21  8:44 PM  Result Value Ref Range   Glucose-Capillary 238 (H) 70 - 99 mg/dL  Type and screen [facility]     Status: None (Preliminary result)   Collection Time: 08/17/21 11:26 PM  Result Value Ref Range   ABO/RH(D) A POS    Antibody Screen NEG    Sample Expiration      08/20/2021,2359 Performed at [facility], [address], [place], [place] [address]    Unit Number [id number]    Blood Component Type RED CELLS,LR    Unit division 00    Status of Unit ALLOCATED    Transfusion Status OK TO TRANSFUSE    Crossmatch Result Compatible    Unit Number [id number]    Blood Component Type RED CELLS,LR    Unit division 00    Status of Unit ALLOCATED    Transfusion Status OK TO TRANSFUSE    Crossmatch Result Compatible    Unit Number [id number]    Blood Component Type RED CELLS,LR    Unit division 00    Status of Unit ALLOCATED    Transfusion Status OK TO TRANSFUSE    Crossmatch Result Compatible    Unit Number [id number]    Blood Component Type RED CELLS,LR    Unit division 00    Status of Unit ALLOCATED    Transfusion Status OK TO TRANSFUSE    Crossmatch Result Compatible   Glucose, capillary     Status: Abnormal   Collection Time: 08/17/21 11:33 PM  Result Value Ref Range   Glucose-Capillary 267 (H) 70 - 99 mg/dL  Urinalysis, Routine w reflex microscopic Urine, Catheterized     Status: Abnormal   Collection Time: 08/18/21  1:14 AM  Result  Value Ref Range   Color, Urine YELLOW YELLOW   APPearance HAZY (A) CLEAR   Specific Gravity, Urine 1.014 1.005 - 1.030   pH 5.0 5.0 - 8.0   Glucose, UA NEGATIVE NEGATIVE mg/dL   Hgb urine dipstick SMALL (A) NEGATIVE   Bilirubin Urine NEGATIVE NEGATIVE   Ketones,  ur NEGATIVE NEGATIVE mg/dL   Protein, ur NEGATIVE NEGATIVE mg/dL   Nitrite NEGATIVE NEGATIVE   Leukocytes,Ua SMALL (A) NEGATIVE   RBC / HPF 6-10 0 - 5 RBC/hpf   WBC, UA 21-50 0 - 5 WBC/hpf   Bacteria, UA RARE (A) NONE SEEN   Squamous Epithelial / LPF 0-5 0 - 5   Mucus PRESENT    Hyaline Casts, UA PRESENT   Glucose, capillary     Status: Abnormal   Collection Time: 08/18/21  4:20 AM  Result Value Ref Range   Glucose-Capillary 208 (H) 70 - 99 mg/dL  CBC     Status: Abnormal   Collection Time: 08/18/21  4:28 AM  Result Value Ref Range   WBC 16.6 (H) 4.0 - 10.5 K/uL   RBC 3.90 (L) 4.22 - 5.81 MIL/uL   Hemoglobin 11.6 (L) 13.0 - 17.0 g/dL   HCT 45.4 (L) 09.8 - 11.9 %   MCV 91.5 80.0 - 100.0 fL   MCH 29.7 26.0 - 34.0 pg   MCHC 32.5 30.0 - 36.0 g/dL   RDW 14.7 (H) 82.9 - 56.2 %   Platelets 233 150 - 400 K/uL   nRBC 6.7 (H) 0.0 - 0.2 %  Basic metabolic panel     Status: Abnormal   Collection Time: 08/18/21  4:28 AM  Result Value Ref Range   Sodium 147 (H) 135 - 145 mmol/L   Potassium 3.8 3.5 - 5.1 mmol/L   Chloride 104 98 - 111 mmol/L   CO2 32 22 - 32 mmol/L   Glucose, Bld 209 (H) 70 - 99 mg/dL   BUN 44 (H) 6 - 20 mg/dL   Creatinine, Ser 1.30 0.61 - 1.24 mg/dL   Calcium 8.5 (L) 8.9 - 10.3 mg/dL   GFR, Estimated >86 >57 mL/min   Anion gap 11 5 - 15  I-STAT 7, (LYTES, BLD GAS, ICA, H+H)     Status: Abnormal   Collection Time: 08/18/21  4:28 AM  Result Value Ref Range   pH, Arterial 7.477 (H) 7.35 - 7.45   pCO2 arterial 49.4 (H) 32 - 48 mmHg   pO2, Arterial 91 83 - 108 mmHg   Bicarbonate 35.9 (H) 20.0 - 28.0 mmol/L   TCO2 37 (H) 22 - 32 mmol/L   O2 Saturation 97 %   Acid-Base Excess 11.0 (H) 0.0 - 2.0 mmol/L    Sodium 146 (H) 135 - 145 mmol/L   Potassium 3.7 3.5 - 5.1 mmol/L   Calcium, Ion 1.15 1.15 - 1.40 mmol/L   HCT 35.0 (L) 39.0 - 52.0 %   Hemoglobin 11.9 (L) 13.0 - 17.0 g/dL   Patient temperature 846.9 F    Collection site Web designer by Operator    Sample type ARTERIAL     Assessment & Plan: Present on Admission:  Trauma of chest  ARDS (adult respiratory distress syndrome) ([name])  Contusion of left lung  Acute on chronic respiratory failure with hypoxia and hypercapnia ([name])  Critical polytrauma  TBI (traumatic brain injury) ([name])    LOS: 13 days   Additional comments:I reviewed the patient's new clinical lab test results. / 84M [facility]   SDH, IVH, SAH - NSGY c/s, Dr. Maurice Small, repeat CT head 7/12 some IVH but fairly stable, okay for above normal sodiums and for heparin as long as no bolus dosing. Head CT 7/16 with edema, but improvement in IVH. No midline shift. L rib fx 1-8 with L HPTX - L CT to water  seal today Extensive pulmonary consolidation with refractory hypoxemia - cannulated for VV-ECMO 7/11, sweep trial since ~10a yest, plan for decannulation today  Fracture dislocation L elbow/olecranon and ulnar styloid with comminuted fracture of the left midshaft and distal humerus - initial eval by Dr. Linna Caprice, splinted, definitive care per Dr. Derinda Sis Trauma, OR 7/13 with Dr. Jena Gauss I&D, CR L humerus. OR 7/20 with Dr. Carola Frost for ORIF Comminuted fracture dislocation R hip - reduced in TB, KI in place, traction pin 7/13 by Dr. Jena Gauss.  Will need closed management with traction due to proximity of soft tissue wound.  Complex lacerations RUE/RLE with degloving - operative washout and repair 7/13 by Dr. Jena Gauss, vac to RLE, proximity to R hip makes fixation high risk. OR for washout 7/18 with Dr. Carola Frost. WOC c/s for vac changes MWF, to start 7/24 R comminuted first metacarpal fracture/open - irrigated and splinted by Dr. Linna Caprice, hand surgery notified by EDP ID/Suspected aspiration  - completed empiric merem, resp CX today, blood CX P Acute blood loss anemia  Swelling of L knee - XR today Shock - intermittently on and off low dose levo FEN - TF at 95/h (RD to adjust today), continue diuresis with lasix gtt DVT - SCDs, LMWH Dispo - ICU Critical Care Total Time*: 44 Minutes  Violeta Gelinas, MD, MPH, FACS Trauma & General Surgery Use [email or web address] to contact on call provider  08/18/2021  *Care during the described time interval was provided by me. I have reviewed this patient's available data, including medical history, events of note, physical examination and test results as part of my evaluation.

## 2021-08-18 NOTE — Inpatient Diabetes Management (Signed)
Inpatient Diabetes Program Recommendations  AACE/ADA: New Consensus Statement on Inpatient Glycemic Control (2015)  Target Ranges:  Prepandial:   less than 140 mg/dL      Peak postprandial:   less than 180 mg/dL (1-2 hours)      Critically ill patients:  140 - 180 mg/dL   Lab Results  Component Value Date   GLUCAP 204 (H) 08/18/2021   HGBA1C 5.5 08/15/2021    Review of Glycemic Control  Latest Reference Range & Units 08/17/21 20:44 08/17/21 23:33 08/18/21 04:20 08/18/21 08:09  Glucose-Capillary 70 - 99 mg/dL 258 (H) 527 (H) 782 (H) 204 (H)  (H): Data is abnormally high Current orders for Inpatient glycemic control:  Novolog 2-6 units Q4 hours, Levemir 25 units BID, Novolog 6 units Q4H, Novolog 0-20 units Q4H   Pivot Tube Feeds rate increased to 95 ml/hr   Inpatient Diabetes Program Recommendations  Consider increasing tube feed coverage to Novolog 12 units Q4H (to be stopped or held in the event tube feeds are stopped).   Thanks, Lujean Rave, MSN, RNC-OB Diabetes Coordinator [phone] (8a-5p)
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
## 2021-08-19 ENCOUNTER — Inpatient Hospital Stay (HOSPITAL_COMMUNITY): Payer: Medicaid Other

## 2021-08-19 HISTORY — PX: IR US GUIDE VASC ACCESS LEFT: IMG2389

## 2021-08-19 HISTORY — PX: IR FLUORO GUIDE CV LINE LEFT: IMG2282

## 2021-08-19 LAB — BASIC METABOLIC PANEL
Anion gap: 12 (ref 5–15)
Anion gap: 10 (ref 5–15)
BUN: 62 mg/dL — ABNORMAL HIGH (ref 6–20)
BUN: 72 mg/dL — ABNORMAL HIGH (ref 6–20)
CO2: 37 mmol/L — ABNORMAL HIGH (ref 22–32)
CO2: 31 mmol/L (ref 22–32)
Calcium: 8.3 mg/dL — ABNORMAL LOW (ref 8.9–10.3)
Calcium: 8.5 mg/dL — ABNORMAL LOW (ref 8.9–10.3)
Chloride: 96 mmol/L — ABNORMAL LOW (ref 98–111)
Chloride: 107 mmol/L (ref 98–111)
Creatinine, Ser: 1.19 mg/dL (ref 0.61–1.24)
Creatinine, Ser: 1.36 mg/dL — ABNORMAL HIGH (ref 0.61–1.24)
GFR, Estimated: >60 mL/min (ref >60)
GFR, Estimated: >60 mL/min (ref >60)
Glucose, Bld: 225 mg/dL — ABNORMAL HIGH (ref 70–99)
Glucose, Bld: 181 mg/dL — ABNORMAL HIGH (ref 70–99)
Potassium: 3 mmol/L — ABNORMAL LOW (ref 3.5–5.1)
Potassium: 3.2 mmol/L — ABNORMAL LOW (ref 3.5–5.1)
Sodium: 145 mmol/L (ref 135–145)
Sodium: 148 mmol/L — ABNORMAL HIGH (ref 135–145)

## 2021-08-19 LAB — GLUCOSE, CAPILLARY
Glucose-Capillary: 223 mg/dL — ABNORMAL HIGH (ref 70–99)
Glucose-Capillary: 203 mg/dL — ABNORMAL HIGH (ref 70–99)
Glucose-Capillary: 187 mg/dL — ABNORMAL HIGH (ref 70–99)
Glucose-Capillary: 130 mg/dL — ABNORMAL HIGH (ref 70–99)
Glucose-Capillary: 158 mg/dL — ABNORMAL HIGH (ref 70–99)
Glucose-Capillary: 181 mg/dL — ABNORMAL HIGH (ref 70–99)

## 2021-08-19 LAB — CBC
HCT: 36.5 % — ABNORMAL LOW (ref 39.0–52.0)
HCT: 34.9 % — ABNORMAL LOW (ref 39.0–52.0)
Hemoglobin: 11.9 g/dL — ABNORMAL LOW (ref 13.0–17.0)
Hemoglobin: 10.9 g/dL — ABNORMAL LOW (ref 13.0–17.0)
MCH: 30.1 pg (ref 26.0–34.0)
MCH: 29.9 pg (ref 26.0–34.0)
MCHC: 32.6 g/dL (ref 30.0–36.0)
MCHC: 31.2 g/dL (ref 30.0–36.0)
MCV: 92.4 fL (ref 80.0–100.0)
MCV: 95.6 fL (ref 80.0–100.0)
Platelets: 318 10*3/uL (ref 150–400)
Platelets: 318 10*3/uL (ref 150–400)
RBC: 3.95 MIL/uL — ABNORMAL LOW (ref 4.22–5.81)
RBC: 3.65 MIL/uL — ABNORMAL LOW (ref 4.22–5.81)
RDW: 18.6 % — ABNORMAL HIGH (ref 11.5–15.5)
RDW: 18.4 % — ABNORMAL HIGH (ref 11.5–15.5)
WBC: 13.4 10*3/uL — ABNORMAL HIGH (ref 4.0–10.5)
WBC: 10.1 10*3/uL (ref 4.0–10.5)
nRBC: 1.5 % — ABNORMAL HIGH (ref 0.0–0.2)
nRBC: 1.2 % — ABNORMAL HIGH (ref 0.0–0.2)

## 2021-08-19 LAB — PHOSPHORUS: Phosphorus: 6 mg/dL — ABNORMAL HIGH (ref 2.5–4.6)

## 2021-08-19 LAB — MAGNESIUM: Magnesium: 3 mg/dL — ABNORMAL HIGH (ref 1.7–2.4)

## 2021-08-19 MED ORDER — POTASSIUM CHLORIDE 20 MEQ PO PACK
20.0000 meq | PACK | ORAL | Status: AC
  Administered 2021-08-19 (×2): 20 meq
  Filled 2021-08-19 (×2): qty 1

## 2021-08-19 MED ORDER — VANCOMYCIN HCL IN DEXTROSE 1-5 GM/200ML-% IV SOLN
1000.0000 mg | Freq: Two times a day (BID) | INTRAVENOUS | Status: DC
  Administered 2021-08-20 – 2021-08-21 (×3): 1000 mg via INTRAVENOUS
  Filled 2021-08-19 (×3): qty 200

## 2021-08-19 MED ORDER — INSULIN ASPART 100 UNIT/ML IJ SOLN
12.0000 [IU] | INTRAMUSCULAR | Status: DC
Start: 2021-08-19 — End: 2021-08-19
  Administered 2021-08-19: 12 [IU] via SUBCUTANEOUS

## 2021-08-19 MED ORDER — QUETIAPINE FUMARATE 100 MG PO TABS
200.0000 mg | ORAL_TABLET | Freq: Two times a day (BID) | ORAL | Status: DC
  Administered 2021-08-19 – 2021-08-20 (×4): 200 mg
  Filled 2021-08-19 (×4): qty 2

## 2021-08-19 MED ORDER — VANCOMYCIN HCL 1500 MG/300ML IV SOLN
1500.0000 mg | INTRAVENOUS | Status: AC
  Administered 2021-08-19: 1500 mg via INTRAVENOUS
  Filled 2021-08-19: qty 300

## 2021-08-19 MED ORDER — LIDOCAINE HCL 1 % IJ SOLN
INTRAMUSCULAR | Status: AC
  Administered 2021-08-19: 10 mL
  Filled 2021-08-19: qty 20

## 2021-08-19 MED ORDER — FLUCONAZOLE IN SODIUM CHLORIDE 400-0.9 MG/200ML-% IV SOLN
800.0000 mg | INTRAVENOUS | Status: DC
  Administered 2021-08-19: 800 mg via INTRAVENOUS
  Administered 2021-08-20: 400 mg via INTRAVENOUS
  Filled 2021-08-19 (×3): qty 400

## 2021-08-19 MED ORDER — POTASSIUM CHLORIDE 10 MEQ/50ML IV SOLN
10.0000 meq | INTRAVENOUS | Status: AC
  Administered 2021-08-19 – 2021-08-20 (×6): 10 meq via INTRAVENOUS
  Filled 2021-08-19 (×6): qty 50

## 2021-08-19 MED ORDER — POTASSIUM CHLORIDE 10 MEQ/50ML IV SOLN
10.0000 meq | INTRAVENOUS | Status: AC
  Administered 2021-08-19 (×4): 10 meq via INTRAVENOUS
  Filled 2021-08-19 (×4): qty 50

## 2021-08-19 MED ORDER — CLONAZEPAM 1 MG PO TABS
2.0000 mg | ORAL_TABLET | Freq: Three times a day (TID) | ORAL | Status: DC
  Administered 2021-08-19 – 2021-08-21 (×9): 2 mg
  Filled 2021-08-19 (×9): qty 2

## 2021-08-19 MED ORDER — METRONIDAZOLE 500 MG/100ML IV SOLN
500.0000 mg | Freq: Two times a day (BID) | INTRAVENOUS | Status: DC
  Administered 2021-08-19 – 2021-08-21 (×4): 500 mg via INTRAVENOUS
  Filled 2021-08-19 (×4): qty 100

## 2021-08-19 MED ORDER — POTASSIUM CHLORIDE 20 MEQ PO PACK
40.0000 meq | PACK | Freq: Once | ORAL | Status: AC
  Administered 2021-08-19: 40 meq
  Filled 2021-08-19: qty 2

## 2021-08-19 MED ORDER — INSULIN ASPART 100 UNIT/ML IJ SOLN
12.0000 [IU] | INTRAMUSCULAR | Status: DC
  Administered 2021-08-19 – 2021-08-26 (×35): 12 [IU] via SUBCUTANEOUS

## 2021-08-19 NOTE — Progress Notes (Signed)
Patient monitored during central line insertion. Patient tolerated procedure. Vital signs stable.

## 2021-08-19 NOTE — Procedures (Signed)
Interventional Radiology Procedure Note  This young gentleman has been referred for image guided central venous catheter.  Discussed with Dr. Janee Morn of the trauma team.  Currently no need for renal support, and he has a current fever.   Recently removed right IJ ECMO cannula.  He may be requiring more durable CVC in the future.  Current desire to remove the left IJ line for infection prevention.   Patient currently has 4/5 lumen in use at the left IJ site. Trach in place with trach collar.   Procedure:  Image guided left CFV triple lumen catheter.   Complications: None  Recommendations:  - Ok to use - If needed, VIR can place a new line, tunneled if required in right or left IJ in the future when no longer concern for infection, presuming patency. - Patient to return to ICU for exchange of IV access and lines/tubing and removal of the left IJ line.  - Routine line care   Signed,  Yvone Neu. Loreta Ave, DO

## 2021-08-19 NOTE — Progress Notes (Addendum)
Patient ID: Jeff [name], male   DOB: [birth year], 21 y.o.   MRN: [id number] Follow up - Trauma Critical Care   Patient Details:    Jeff [name] is an 21 y.o. male.  Lines/tubes : CVC Triple Lumen 08/07/21 Left Internal jugular (Active)  Indication for Insertion or Continuance of Line Vasoactive infusions 08/18/21 2000  Site Assessment Clean, Dry, Intact 08/18/21 2000  Proximal Lumen Status Infusing 08/18/21 2000  Medial Lumen Status Infusing 08/18/21 2000  Distal Lumen Status Infusing 08/18/21 2000  Dressing Type Transparent 08/18/21 2000  Dressing Status Antimicrobial disc in place 08/18/21 2000  Line Care Connections checked and tightened 08/17/21 0800  Dressing Intervention Dressing changed;Antimicrobial disc changed 08/17/21 1200  Dressing Change Due 08/24/21 08/18/21 0800     Arterial Line 08/05/21 Left Femoral (Active)  Site Assessment Clean, Dry, Intact 08/18/21 2000  Line Status Pulsatile blood flow 08/18/21 2000  Art Line Waveform Appropriate;Square wave test performed 08/18/21 2000  Art Line Interventions Zeroed and calibrated 08/18/21 2000  Color/Movement/Sensation Capillary refill less than 3 sec 08/18/21 2000  Dressing Type Transparent 08/18/21 2000  Dressing Status Clean, Dry, Intact 08/18/21 2000  Interventions Dressing changed;Antimicrobial disc changed 08/19/21 0600  Dressing Change Due 08/23/21 08/18/21 0800     Chest Tube 1 Lateral;Left Pleural (Active)  Status -20 cm H2O 08/18/21 2000  Chest Tube Air Leak None 08/18/21 2000  Patency Intervention Tip/tilt 08/18/21 2000  Drainage Description Serosanguineous 08/18/21 2000  Dressing Status Clean, Dry, Intact 08/18/21 2000  Dressing Intervention Dressing changed 08/16/21 1600  Site Assessment Other (Comment) 08/17/21 0800  Surrounding Skin Unable to view 08/18/21 2000  Intake (mL) 20 mL 08/18/21 1600  Output (mL) 20 mL 08/19/21 0600     Negative Pressure Wound Therapy Thigh Right;Upper  (Active)  Wound Image      08/09/21 2000  Last dressing change 08/18/21 08/18/21 1345  Site / Wound Assessment Dressing in place / Unable to assess 08/18/21 2000  Peri-wound Assessment Intact;Pink 08/18/21 1345  Wound filler - Black foam 1 08/18/21 1345  Cycle Continuous 08/18/21 2000  Target Pressure (mmHg) 125 08/18/21 1345  Instillation Volume 0 mL 08/13/21 0818  Canister Changed Yes 08/18/21 1345  Machine plugged into wall outlet (NOT bed outlet) Yes 08/18/21 1345  Dressing Status Intact 08/18/21 1345  Drainage Amount Minimal 08/15/21 2000  Drainage Description Sanguineous 08/15/21 2000  Output (mL) 50 mL 08/19/21 0600     Flatus Tube/Pouch (Active)  Daily care Skin around tube assessed 08/18/21 2000  Output (mL) 580 mL 08/19/21 0636  Intake (mL) 30 mL 08/11/21 1700     External Urinary Catheter (Active)  Collection Container Dedicated Suction Canister 08/18/21 2000  Suction (Verified suction is between 40-80 mmHg) Yes 08/18/21 2000  Site Assessment Clean, Dry, Intact 08/18/21 2000  Output (mL) 500 mL 08/19/21 0400    Microbiology/Sepsis markers: Results for orders placed or performed during the hospital encounter of 08/05/21  MRSA Next Gen by PCR, Nasal     Status: None   Collection Time: 08/06/21  2:58 AM   Specimen: Nasal Mucosa; Nasal Swab  Result Value Ref Range Status   MRSA by PCR Next Gen NOT DETECTED NOT DETECTED Final    Comment: (NOTE) The GeneXpert MRSA Assay (FDA approved for NASAL specimens only), is one component of a comprehensive MRSA colonization surveillance program. It is not intended to diagnose MRSA infection nor to guide or monitor treatment for MRSA infections. Test performance is not FDA approved in patients  less than 61 years old. Performed at [facility], [address], [place], [place] [address]   Resp Panel by RT-PCR (Flu A&B, Covid) Anterior Nasal Swab     Status: None   Collection Time: 08/06/21  3:45 AM   Specimen: Anterior  Nasal Swab  Result Value Ref Range Status   SARS Coronavirus 2 by RT PCR NEGATIVE NEGATIVE Final    Comment: (NOTE) SARS-CoV-2 target nucleic acids are NOT DETECTED.  The SARS-CoV-2 RNA is generally detectable in upper respiratory specimens during the acute phase of infection. The lowest concentration of SARS-CoV-2 viral copies this assay can detect is 138 copies/mL. A negative result does not preclude SARS-Cov-2 infection and should not be used as the sole basis for treatment or other patient management decisions. A negative result may occur with  improper specimen collection/handling, submission of specimen other than nasopharyngeal swab, presence of viral mutation(s) within the areas targeted by this assay, and inadequate number of viral copies(<138 copies/mL). A negative result must be combined with clinical observations, patient history, and epidemiological information. The expected result is Negative.  Fact Sheet for Patients:  BloggerCourse.com  Fact Sheet for Healthcare Providers:  SeriousBroker.it  This test is no t yet approved or cleared by the [place] FDA and  has been authorized for detection and/or diagnosis of SARS-CoV-2 by FDA under an Emergency Use Authorization (EUA). This EUA will remain  in effect (meaning this test can be used) for the duration of the COVID-19 declaration under Section 564(b)(1) of the Act, 21 U.S.C.section 360bbb-3(b)(1), unless the authorization is terminated  or revoked sooner.       Influenza A by PCR NEGATIVE NEGATIVE Final   Influenza B by PCR NEGATIVE NEGATIVE Final    Comment: (NOTE) The Xpert Xpress SARS-CoV-2/FLU/RSV plus assay is intended as an aid in the diagnosis of influenza from Nasopharyngeal swab specimens and should not be used as a sole basis for treatment. Nasal washings and aspirates are unacceptable for Xpert Xpress SARS-CoV-2/FLU/RSV testing.  Fact Sheet for  Patients: BloggerCourse.com  Fact Sheet for Healthcare Providers: SeriousBroker.it  This test is not yet approved or cleared by the [place] FDA and has been authorized for detection and/or diagnosis of SARS-CoV-2 by FDA under an Emergency Use Authorization (EUA). This EUA will remain in effect (meaning this test can be used) for the duration of the COVID-19 declaration under Section 564(b)(1) of the Act, 21 U.S.C. section 360bbb-3(b)(1), unless the authorization is terminated or revoked.  Performed at [facility], [address] [place], [place] [address]   Culture, Respiratory w Gram Stain     Status: None   Collection Time: 08/07/21  8:01 PM   Specimen: Tracheal Aspirate; Respiratory  Result Value Ref Range Status   Specimen Description TRACHEAL ASPIRATE  Final   Special Requests NONE  Final   Gram Stain   Final    RARE WBC PRESENT,BOTH PMN AND MONONUCLEAR NO ORGANISMS SEEN    Culture   Final    NO GROWTH 2 DAYS Performed at [facility], [address], [place], [place] [address]    Report Status 08/10/2021 FINAL  Final  Culture, blood (Routine X 2) w Reflex to ID Panel     Status: None (Preliminary result)   Collection Time: 08/18/21  6:35 AM   Specimen: BLOOD  Result Value Ref Range Status   Specimen Description BLOOD FOOT  Final   Special Requests   Final    BOTTLES  DRAWN AEROBIC AND ANAEROBIC Blood Culture adequate volume   Culture   Final    NO GROWTH < 24 HOURS Performed at [facility], [address], [place], [place] [address]    Report Status PENDING  Incomplete  Culture, blood (Routine X 2) w Reflex to ID Panel     Status: None (Preliminary result)   Collection Time: 08/18/21  6:41 AM   Specimen: BLOOD  Result Value Ref Range Status   Specimen Description BLOOD FOOT  Final   Special Requests   Final    BOTTLES DRAWN AEROBIC AND ANAEROBIC Blood Culture adequate volume   Culture    Final    NO GROWTH < 24 HOURS Performed at [facility], [address], [place], [place] [address]    Report Status PENDING  Incomplete  Culture, Respiratory w Gram Stain     Status: None (Preliminary result)   Collection Time: 08/18/21  7:46 AM   Specimen: Tracheal Aspirate; Respiratory  Result Value Ref Range Status   Specimen Description EXPECTORATED SPUTUM  Final   Special Requests NONE  Final   Gram Stain   Final    FEW WBC PRESENT, PREDOMINANTLY PMN RARE GRAM NEGATIVE RODS RARE YEAST WITH PSEUDOHYPHAE Performed at [facility], [address], [place], [place] [address]    Culture PENDING  Incomplete   Report Status PENDING  Incomplete    Anti-infectives:  Anti-infectives (From admission, onward)    Start     Dose/Rate Route Frequency Ordered Stop   08/18/21 1115  ceFEPIme (MAXIPIME) 2 g in sodium chloride 0.9 % 100 mL IVPB        2 g 200 mL/hr over 30 Minutes Intravenous Every 8 hours 08/18/21 1017     08/13/21 2000  vancomycin (VANCOREADY) IVPB 1500 mg/300 mL  Status:  Discontinued        1,500 mg 150 mL/hr over 120 Minutes Intravenous Every 12 hours 08/13/21 1242 08/15/21 0952   08/10/21 1000  vancomycin (VANCOREADY) IVPB 1500 mg/300 mL  Status:  Discontinued        1,500 mg 150 mL/hr over 120 Minutes Intravenous Every 24 hours 08/09/21 0820 08/13/21 1242   08/09/21 0915  vancomycin (VANCOREADY) IVPB 2000 mg/400 mL        2,000 mg 200 mL/hr over 120 Minutes Intravenous  Once 08/09/21 0820 08/09/21 1050   08/09/21 0830  vancomycin (VANCOREADY) IVPB 1750 mg/350 mL  Status:  Discontinued        1,750 mg 175 mL/hr over 120 Minutes Intravenous  Once 08/09/21 0733 08/09/21 0820   08/07/21 1105  tobramycin (NEBCIN) powder  Status:  Discontinued          As needed 08/07/21 1106 08/07/21 1224   08/07/21 1104  vancomycin (VANCOCIN) powder  Status:  Discontinued          As needed 08/07/21 1105 08/07/21 1224   08/06/21 1000  vancomycin (VANCOREADY) IVPB 750  mg/150 mL  Status:  Discontinued        750 mg 150 mL/hr over 60 Minutes Intravenous Every 12 hours 08/05/21 2229 08/05/21 2313   08/06/21 0600  meropenem (MERREM) 1 g in sodium chloride 0.9 % 100 mL IVPB  Status:  Discontinued        1 g 200 mL/hr over 30 Minutes Intravenous Every 8 hours 08/05/21 2352 08/17/21 1043   08/06/21 0015  vancomycin (VANCOREADY) IVPB 750 mg/150 mL        750 mg  150 mL/hr over 60 Minutes Intravenous  Once 08/05/21 2313 08/06/21 0208   08/05/21 2317  vancomycin variable dose per unstable renal function (pharmacist dosing)  Status:  Discontinued         Does not apply See admin instructions 08/05/21 2317 08/06/21 0917   08/05/21 2215  Ampicillin-Sulbactam (UNASYN) 3 g in sodium chloride 0.9 % 100 mL IVPB  Status:  Discontinued        3 g 200 mL/hr over 30 Minutes Intravenous Every 6 hours 08/05/21 2209 08/05/21 2351   08/05/21 2215  vancomycin (VANCOCIN) IVPB 1000 mg/200 mL premix        1,000 mg 200 mL/hr over 60 Minutes Intravenous  Once 08/05/21 2213 08/05/21 2217   08/05/21 1815  ceFAZolin (ANCEF) IVPB 2g/100 mL premix        2 g 200 mL/hr over 30 Minutes Intravenous NOW 08/05/21 1801 08/05/21 1936     Consults: Treatment Team:  Jadene Pierini, MD Haddix, Gillie Manners, MD    Studies:    Events:  Subjective:    Overnight Issues:   Objective:  Vital signs for last 24 hours: Temp:  [100.6 F (38.1 C)-103.1 F (39.5 C)] 100.6 F (38.1 C) (07/25 0400) Pulse Rate:  [130-157] 135 (07/25 0600) Resp:  [21-24] 24 (07/25 0600) BP: (142-161)/(65-77) 143/65 (07/24 1519) SpO2:  [95 %-98 %] 97 % (07/25 0600) Arterial Line BP: (115-252)/(55-220) 252/220 (07/25 0600) FiO2 (%):  [40 %] 40 % (07/25 0354) Weight:  [75.8 kg] 75.8 kg (07/25 0600)  Hemodynamic parameters for last 24 hours:    Intake/Output from previous day: 07/24 0701 - 07/25 0700 In: 4448.1 [I.V.:289; IR/CV:8938.1; IV Piggyback:691.6] Out: 7570 [Urine:6375; Drains:100; Stool:1055;  Chest Tube:40]  Intake/Output this shift: No intake/output data recorded.  Vent settings for last 24 hours: Vent Mode: PRVC FiO2 (%):  [40 %] 40 % Set Rate:  [24 bmp] 24 bmp Vt Set:  [540 mL] 540 mL PEEP:  [12 cmH20] 12 cmH20 Plateau Pressure:  [21 cmH20-24 cmH20] 21 cmH20  Physical Exam:  General: on vent Neuro: arousable and follows some commands HEENT/Neck: trach-clean, intact Resp: few rhonchi CVS: RRR GI: soft, NT Extremities: no edema, no erythema, pulses WNL and SK TXN RLE  Results for orders placed or performed during the hospital encounter of 08/05/21 (from the past 24 hour(s))  Culture, Respiratory w Gram Stain     Status: None (Preliminary result)   Collection Time: 08/18/21  7:46 AM   Specimen: Tracheal Aspirate; Respiratory  Result Value Ref Range   Specimen Description EXPECTORATED SPUTUM    Special Requests NONE    Gram Stain      FEW WBC PRESENT, PREDOMINANTLY PMN RARE GRAM NEGATIVE RODS RARE YEAST WITH PSEUDOHYPHAE Performed at [name], [address], [place], [place] [address]    Culture PENDING    Report Status PENDING   Glucose, capillary     Status: Abnormal   Collection Time: 08/18/21  8:09 AM  Result Value Ref Range   Glucose-Capillary 204 (H) 70 - 99 mg/dL  Glucose, capillary     Status: Abnormal   Collection Time: 08/18/21 10:54 AM  Result Value Ref Range   Glucose-Capillary 213 (H) 70 - 99 mg/dL  Glucose, capillary     Status: Abnormal   Collection Time: 08/18/21  3:55 PM  Result Value Ref Range   Glucose-Capillary 172 (H) 70 - 99 mg/dL  CBC     Status: Abnormal   Collection Time: 08/18/21  5:13 PM  Result Value Ref Range   WBC 13.9 (H) 4.0 - 10.5 K/uL   RBC 3.75 (L) 4.22 - 5.81 MIL/uL   Hemoglobin 11.2 (L) 13.0 - 17.0 g/dL   HCT 16.134.5 (L) 09.639.0 - 04.552.0 %   MCV 92.0 80.0 - 100.0 fL   MCH 29.9 26.0 - 34.0 pg   MCHC 32.5 30.0 - 36.0 g/dL   RDW 40.918.6 (H) 81.111.5 - 91.415.5 %   Platelets 264 150 - 400 K/uL   nRBC 3.0 (H) 0.0 - 0.2 %   Basic metabolic panel     Status: Abnormal   Collection Time: 08/18/21  5:13 PM  Result Value Ref Range   Sodium 146 (H) 135 - 145 mmol/L   Potassium 3.5 3.5 - 5.1 mmol/L   Chloride 102 98 - 111 mmol/L   CO2 33 (H) 22 - 32 mmol/L   Glucose, Bld 200 (H) 70 - 99 mg/dL   BUN 56 (H) 6 - 20 mg/dL   Creatinine, Ser 7.821.30 (H) 0.61 - 1.24 mg/dL   Calcium 8.4 (L) 8.9 - 10.3 mg/dL   GFR, Estimated >95>60 >62>60 mL/min   Anion gap 11 5 - 15  Glucose, capillary     Status: Abnormal   Collection Time: 08/18/21  7:21 PM  Result Value Ref Range   Glucose-Capillary 213 (H) 70 - 99 mg/dL  Glucose, capillary     Status: Abnormal   Collection Time: 08/18/21 11:34 PM  Result Value Ref Range   Glucose-Capillary 242 (H) 70 - 99 mg/dL  CBC     Status: Abnormal   Collection Time: 08/19/21  4:17 AM  Result Value Ref Range   WBC 13.4 (H) 4.0 - 10.5 K/uL   RBC 3.95 (L) 4.22 - 5.81 MIL/uL   Hemoglobin 11.9 (L) 13.0 - 17.0 g/dL   HCT 13.036.5 (L) 86.539.0 - 78.452.0 %   MCV 92.4 80.0 - 100.0 fL   MCH 30.1 26.0 - 34.0 pg   MCHC 32.6 30.0 - 36.0 g/dL   RDW 69.618.6 (H) 29.511.5 - 28.415.5 %   Platelets 318 150 - 400 K/uL   nRBC 1.5 (H) 0.0 - 0.2 %  Basic metabolic panel     Status: Abnormal   Collection Time: 08/19/21  4:17 AM  Result Value Ref Range   Sodium 145 135 - 145 mmol/L   Potassium 3.0 (L) 3.5 - 5.1 mmol/L   Chloride 96 (L) 98 - 111 mmol/L   CO2 37 (H) 22 - 32 mmol/L   Glucose, Bld 225 (H) 70 - 99 mg/dL   BUN 62 (H) 6 - 20 mg/dL   Creatinine, Ser 1.321.19 0.61 - 1.24 mg/dL   Calcium 8.3 (L) 8.9 - 10.3 mg/dL   GFR, Estimated >44>60 >01>60 mL/min   Anion gap 12 5 - 15  Magnesium     Status: Abnormal   Collection Time: 08/19/21  4:17 AM  Result Value Ref Range   Magnesium 3.0 (H) 1.7 - 2.4 mg/dL  Phosphorus     Status: Abnormal   Collection Time: 08/19/21  4:17 AM  Result Value Ref Range   Phosphorus 6.0 (H) 2.5 - 4.6 mg/dL  Glucose, capillary     Status: Abnormal   Collection Time: 08/19/21  4:20 AM  Result Value Ref  Range   Glucose-Capillary 223 (H) 70 - 99 mg/dL    Assessment & Plan: Present on Admission:  Trauma of chest  ARDS (adult respiratory distress syndrome) ([name])  Contusion of left lung  Acute on chronic respiratory failure with hypoxia and hypercapnia ([name])  Critical polytrauma  TBI (traumatic brain injury) ([name])    LOS: 14 days   Additional comments:I reviewed the patient's new clinical lab test results. / 80M [facility]   SDH, IVH, SAH - NSGY c/s, Dr. Maurice Small, repeat CT head 7/12 some IVH but fairly stable, okay for above normal sodiums and for heparin as long as no bolus dosing. Head CT 7/16 with edema, but improvement in IVH. No midline shift. L rib fx 1-8 with L HPTX - L CT to water seal today Acute hypoxic ventilator dependent respiratory failure - doing well off ECMO, wean PEEP to 10 and maybe 8 later today. Fracture dislocation L elbow/olecranon and ulnar styloid with comminuted fracture of the left midshaft and distal humerus - initial eval by Dr. Linna Caprice, splinted, definitive care per Dr. Derinda Sis Trauma, OR 7/13 with Dr. Jena Gauss I&D, CR L humerus. OR 7/20 with Dr. Carola Frost for ORIF Comminuted fracture dislocation R hip - reduced in TB, KI in place, traction pin 7/13 by Dr. Jena Gauss.  Will need closed management with traction due to proximity of soft tissue wound.  Complex lacerations RUE/RLE with degloving - operative washout and repair 7/13 by Dr. Jena Gauss, vac to RLE, proximity to R hip makes fixation high risk. OR for washout 7/18 with Dr. Carola Frost. WOC c/s for vac changes MWF, to start 7/24 R comminuted first metacarpal fracture/open - irrigated and splinted by Dr. 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 I have reviewed this patient's available data, including medical history, events of note, physical examination and test results as part of my evaluation.

## 2021-08-19 NOTE — Progress Notes (Signed)
[facility] ADULT ICU REPLACEMENT PROTOCOL   The patient does apply for the [facility] Adult ICU Electrolyte Replacment Protocol based on the criteria listed below:   1.Exclusion criteria: TCTS patients, ECMO patients, and Dialysis patients 2. Is GFR >/= 30 ml/min? Yes.    Patient's GFR today is >60 3. Is SCr </= 2? Yes.   Patient's SCr is 1.19 mg/dL 4. Did SCr increase >/= 0.5 in 24 hours? No. 5.Pt's weight >40kg  Yes.   6. Abnormal electrolyte(s): K+ 3.0  7. Electrolytes replaced per protocol 8.  Call MD STAT for K+ </= 2.5, Phos </= 1, or Mag </= 1 Physician:  n/a  Melvern Banker 08/19/2021 5:12 AM

## 2021-08-19 NOTE — Progress Notes (Signed)
Pharmacy Antibiotic Note  Jeff [name] is a 21 y.o. male admitted on 08/05/2021 s/p trauma, requiring ECMO - now decannulated. Tonight pt is spiking fevers to 104.   Pharmacy has been consulted for Vancomycin dosing. Noted pt is currently on Cefepime since 7/24 and Diflucan since 7/25 and starting Flagyl now as well.  Plan: Continue Cefepime 2gm IV q8h Vancomycin 1500mg  IV now then 1000 mg IV Q 12 hrs. Goal AUC 400-550. Expected AUC: 495, SCr used: 1.36 Will f/u renal function, micro data, and pt's clinical condition Vanc levels prn   Height: 5\' 8"  (172.7 cm) Weight: 75.8 kg (167 lb 1.7 oz) IBW/kg (Calculated) : 68.4  Temp (24hrs), Avg:101.6 F (38.7 C), Min:99.8 F (37.7 C), Max:104 F (40 C)  Recent Labs  Lab 08/13/21 0258 08/13/21 0510 08/13/21 0836 08/13/21 1708 08/14/21 0354 08/14/21 1338 08/17/21 1630 08/18/21 0428 08/18/21 1713 08/19/21 0417 08/19/21 1745  WBC 14.5*   < >  --    < > 20.6*   < > 15.3* 16.6* 13.9* 13.4* 10.1  CREATININE 0.86  --   --    < > 0.72   < > 1.00 1.13 1.30* 1.19 1.36*  LATICACIDVEN 1.3  --   --   --  1.1  --   --   --   --   --   --   VANCOTROUGH  --   --  <4*  --   --   --   --   --   --   --   --    < > = values in this interval not displayed.    Estimated Creatinine Clearance: 83.8 mL/min (A) (by C-G formula based on SCr of 1.36 mg/dL (H)).    No Known Allergies  Antimicrobials this admission:  Meropenem 7/12 >7/23 Vancomycin 7/12 x 1; resume 7/15 > 7/21; resume 7/25 > Cefepime 7/24 >  Diflucan 7/25 > Flagyl 7/25 >   Microbiology results:  7/13 Trach aspirate: ng 7/12 MRSA PCR: neg 7/24 BCx x 2 > ngtd 7/24 Resp Cx > rare GNR; rare yeast w/pseudohyphae  Thank you for allowing pharmacy to be a part of this patient's care.  8/24, PharmD, BCPS Please see amion for complete clinical pharmacist phone list 08/19/2021 8:42 PM

## 2021-08-19 NOTE — Progress Notes (Signed)
Patient ID: Jeff [name], male   DOB: 07/17/2000, 21 y.o.   MRN: [id number]    Cental line placement requested per Dr Elwyn Lade for access; medications; etc.. Dr Loreta Ave discussed with Dr Janee Morn  Now scheduled for NON tunneled central venous catheter placement in IR  Sister at bedside- aware of procedure benefits and risks  Agreeable to proceed Consent signed and in chart

## 2021-08-19 NOTE — Progress Notes (Signed)
OT Cancellation Note  Patient Details Name: Jeff [name] MRN: [id number] DOB: 01-09-2001   Cancelled Treatment:    Reason Eval/Treat Not Completed: Patient not medically ready (R LE in traction)  Mateo Flow 08/19/2021, 9:45 AM

## 2021-08-19 NOTE — Progress Notes (Signed)
PT Cancellation Note  Patient Details Name: Jeff [name] MRN: [id number] DOB: 2000-03-01   Cancelled Treatment:    Reason Eval/Treat Not Completed: Patient not medically ready, remains in RLE traction. Acute PT will sign off, please reorder when appropriate for mobility with PT/OT. Would also appreciate clarification on ROM restrictions for all extremities when pt is appropriate to begin mobilizing.  Ina Homes, PT, DPT Acute Rehabilitation Services  Personal: Secure [facility] Rehab Office: [phone]  Malachy Chamber 08/19/2021, 9:28 AM

## 2021-08-19 NOTE — Progress Notes (Signed)
Patient transported on the ventilator to IR and back to 2H03 with no complications. Vitals stable.

## 2021-08-20 ENCOUNTER — Inpatient Hospital Stay (HOSPITAL_COMMUNITY): Payer: Medicaid Other

## 2021-08-20 LAB — POCT I-STAT 7, (LYTES, BLD GAS, ICA,H+H)
Acid-Base Excess: 10 mmol/L — ABNORMAL HIGH (ref 0.0–2.0)
Bicarbonate: 35.3 mmol/L — ABNORMAL HIGH (ref 20.0–28.0)
Calcium, Ion: 1.2 mmol/L (ref 1.15–1.40)
HCT: 31 % — ABNORMAL LOW (ref 39.0–52.0)
Hemoglobin: 10.5 g/dL — ABNORMAL LOW (ref 13.0–17.0)
O2 Saturation: 96 %
Patient temperature: 38
Potassium: 4.8 mmol/L (ref 3.5–5.1)
Sodium: 150 mmol/L — ABNORMAL HIGH (ref 135–145)
TCO2: 37 mmol/L — ABNORMAL HIGH (ref 22–32)
pCO2 arterial: 49.5 mmHg — ABNORMAL HIGH (ref 32–48)
pH, Arterial: 7.464 — ABNORMAL HIGH (ref 7.35–7.45)
pO2, Arterial: 86 mmHg (ref 83–108)

## 2021-08-20 LAB — BASIC METABOLIC PANEL
Anion gap: 10 (ref 5–15)
Anion gap: 9 (ref 5–15)
BUN: 71 mg/dL — ABNORMAL HIGH (ref 6–20)
BUN: 69 mg/dL — ABNORMAL HIGH (ref 6–20)
CO2: 30 mmol/L (ref 22–32)
CO2: 31 mmol/L (ref 22–32)
Calcium: 8.3 mg/dL — ABNORMAL LOW (ref 8.9–10.3)
Calcium: 8.9 mg/dL (ref 8.9–10.3)
Chloride: 107 mmol/L (ref 98–111)
Chloride: 113 mmol/L — ABNORMAL HIGH (ref 98–111)
Creatinine, Ser: 1.08 mg/dL (ref 0.61–1.24)
Creatinine, Ser: 0.97 mg/dL (ref 0.61–1.24)
GFR, Estimated: >60 mL/min (ref >60)
GFR, Estimated: >60 mL/min (ref >60)
Glucose, Bld: 156 mg/dL — ABNORMAL HIGH (ref 70–99)
Glucose, Bld: 181 mg/dL — ABNORMAL HIGH (ref 70–99)
Potassium: 2.9 mmol/L — ABNORMAL LOW (ref 3.5–5.1)
Potassium: 4 mmol/L (ref 3.5–5.1)
Sodium: 147 mmol/L — ABNORMAL HIGH (ref 135–145)
Sodium: 153 mmol/L — ABNORMAL HIGH (ref 135–145)

## 2021-08-20 LAB — CBC
HCT: 35.3 % — ABNORMAL LOW (ref 39.0–52.0)
HCT: 33 % — ABNORMAL LOW (ref 39.0–52.0)
Hemoglobin: 11 g/dL — ABNORMAL LOW (ref 13.0–17.0)
Hemoglobin: 10 g/dL — ABNORMAL LOW (ref 13.0–17.0)
MCH: 30.1 pg (ref 26.0–34.0)
MCH: 30.1 pg (ref 26.0–34.0)
MCHC: 31.2 g/dL (ref 30.0–36.0)
MCHC: 30.3 g/dL (ref 30.0–36.0)
MCV: 96.7 fL (ref 80.0–100.0)
MCV: 99.4 fL (ref 80.0–100.0)
Platelets: 306 10*3/uL (ref 150–400)
Platelets: 338 10*3/uL (ref 150–400)
RBC: 3.65 MIL/uL — ABNORMAL LOW (ref 4.22–5.81)
RBC: 3.32 MIL/uL — ABNORMAL LOW (ref 4.22–5.81)
RDW: 18.3 % — ABNORMAL HIGH (ref 11.5–15.5)
RDW: 18.2 % — ABNORMAL HIGH (ref 11.5–15.5)
WBC: 10.3 10*3/uL (ref 4.0–10.5)
WBC: 7.9 10*3/uL (ref 4.0–10.5)
nRBC: 0.4 % — ABNORMAL HIGH (ref 0.0–0.2)
nRBC: 0 % (ref 0.0–0.2)

## 2021-08-20 LAB — GLUCOSE, CAPILLARY
Glucose-Capillary: 138 mg/dL — ABNORMAL HIGH (ref 70–99)
Glucose-Capillary: 143 mg/dL — ABNORMAL HIGH (ref 70–99)
Glucose-Capillary: 146 mg/dL — ABNORMAL HIGH (ref 70–99)
Glucose-Capillary: 147 mg/dL — ABNORMAL HIGH (ref 70–99)
Glucose-Capillary: 150 mg/dL — ABNORMAL HIGH (ref 70–99)
Glucose-Capillary: 167 mg/dL — ABNORMAL HIGH (ref 70–99)

## 2021-08-20 LAB — MAGNESIUM: Magnesium: 3 mg/dL — ABNORMAL HIGH (ref 1.7–2.4)

## 2021-08-20 MED ORDER — FLUCONAZOLE IN SODIUM CHLORIDE 400-0.9 MG/200ML-% IV SOLN
400.0000 mg | INTRAVENOUS | Status: DC
  Administered 2021-08-21 – 2021-08-28 (×7): 400 mg via INTRAVENOUS
  Filled 2021-08-20 (×9): qty 200

## 2021-08-20 MED ORDER — METOLAZONE 5 MG PO TABS
5.0000 mg | ORAL_TABLET | Freq: Once | ORAL | Status: AC
  Administered 2021-08-20: 5 mg via ORAL
  Filled 2021-08-20: qty 1

## 2021-08-20 MED ORDER — POTASSIUM CHLORIDE 10 MEQ/50ML IV SOLN
10.0000 meq | INTRAVENOUS | Status: DC
  Administered 2021-08-20 (×2): 10 meq via INTRAVENOUS
  Filled 2021-08-20 (×5): qty 50

## 2021-08-20 MED ORDER — HALOPERIDOL LACTATE 5 MG/ML IJ SOLN
10.0000 mg | Freq: Once | INTRAMUSCULAR | Status: AC
Start: 2021-08-20 — End: 2021-08-20
  Administered 2021-08-20: 10 mg via INTRAVENOUS
  Filled 2021-08-20: qty 2

## 2021-08-20 MED ORDER — OXYCODONE HCL 5 MG/5ML PO SOLN
15.0000 mg | ORAL | Status: DC
  Administered 2021-08-20 – 2021-08-28 (×49): 15 mg
  Filled 2021-08-20 (×50): qty 15

## 2021-08-20 MED ORDER — POTASSIUM CHLORIDE 10 MEQ/50ML IV SOLN
10.0000 meq | INTRAVENOUS | Status: AC
  Administered 2021-08-20 (×6): 10 meq via INTRAVENOUS
  Filled 2021-08-20 (×6): qty 50

## 2021-08-20 MED ORDER — FLUCONAZOLE IN SODIUM CHLORIDE 400-0.9 MG/200ML-% IV SOLN
400.0000 mg | INTRAVENOUS | Status: DC
  Administered 2021-08-21 – 2021-08-28 (×8): 400 mg via INTRAVENOUS
  Filled 2021-08-20 (×9): qty 200

## 2021-08-20 MED ORDER — HYDROMORPHONE BOLUS VIA INFUSION
0.2500 mg | INTRAVENOUS | Status: DC | PRN
  Administered 2021-08-20: 1 mg via INTRAVENOUS

## 2021-08-20 MED ORDER — POTASSIUM CHLORIDE 20 MEQ PO PACK
40.0000 meq | PACK | Freq: Three times a day (TID) | ORAL | Status: DC
  Administered 2021-08-20 (×2): 40 meq
  Filled 2021-08-20 (×2): qty 2

## 2021-08-20 MED ORDER — FREE WATER: 200.0000 mL | Status: DC

## 2021-08-20 MED ORDER — FREE WATER
200.0000 mL | Status: DC
Start: 2021-08-20 — End: 2021-08-21
  Administered 2021-08-21 (×3): 200 mL

## 2021-08-20 MED ORDER — POTASSIUM CHLORIDE 20 MEQ PO PACK
40.0000 meq | PACK | Freq: Three times a day (TID) | ORAL | Status: DC
  Filled 2021-08-20: qty 2

## 2021-08-20 NOTE — Progress Notes (Signed)
OT Sign OFF Note  Patient Details Name: Jeff [name] MRN: [id number] DOB: Oct 17, 2000   Cancelled Treatment:    Reason Eval/Treat Not Completed: Patient not medically ready (R LE in traction)  Mateo Flow 08/20/2021, 7:56 AM

## 2021-08-20 NOTE — Progress Notes (Signed)
Patient seen today by trach team for consult.  No education is needed at this time.  All necessary equipment is at beside.   Will continue to follow for progression.  

## 2021-08-20 NOTE — Progress Notes (Signed)
Pt transported on vent @100 % per protocol and settings as per flowsheet to CT scan and returned to 2H03 without incidence. Pt tol well.

## 2021-08-20 NOTE — Consult Note (Signed)
WOC Nurse wound follow up Patient given bolus IV analgesia prior to dressing change.  Wound type: trauma to right lateral thigh with operative repair and washout.  Biologic graft applied and remains sutured in place. NPWT (VAC) dressing change with assistance of additional WOC team member, bedside RN and NT.  Traction weight placed on bed and patient rolled to left side, caution used on right dislocated hip.  Measurement: 28 cm x 17.5 cm x 1.5 cm  Wound bed: covered with graft material, sutured in place.  Minimal serosanguinous oozing noted.  No odor.  Drainage (amount, consistency, odor) see above Periwound: Sutures along perimeter at 12 o'clock and 9 o'clock.  Dressing procedure/placement/frequency: 2 pieces black foam removed.  Wound cleansed with NS and 2pieces black foam replaced in wound bed. COvered with drape.  Seal achieved at 125 mmHg, Change MOn/Wed/Fri.  Will follow.   Please re-consult if needed.  Maple Hudson MSN, RN, FNP-BC CWON Wound, Ostomy, Continence Nurse Pager [phone]

## 2021-08-20 NOTE — Progress Notes (Signed)
Trauma/Critical Care Follow Up Note  Subjective:    Overnight Issues:   Objective:  Vital signs for last 24 hours: Temp:  [98.4 F (36.9 C)-100.8 F (38.2 C)] 99.7 F (37.6 C) (07/26 2000) Pulse Rate:  [113-146] 135 (07/26 2200) Resp:  [16-27] 20 (07/26 2200) SpO2:  [95 %-100 %] 96 % (07/26 2200) Arterial Line BP: (109-161)/(52-78) 130/68 (07/26 2200) FiO2 (%):  [40 %] 40 % (07/26 1920) Weight:  [78.9 kg] 78.9 kg (07/26 0500)  Hemodynamic parameters for last 24 hours:    Intake/Output from previous day: 07/25 0701 - 07/26 0700 In: 4057.1 [I.V.:336.4; NG/GT:2095; IV Piggyback:1625.7] Out: 5220 [Urine:4850; Drains:70; Stool:250; Chest Tube:50]  Intake/Output this shift: Total I/O In: 394.4 [I.V.:8.3; NG/GT:225; IV Piggyback:161.2] Out: 400 [Urine:400]  Vent settings for last 24 hours: Vent Mode: PRVC FiO2 (%):  [40 %] 40 % Set Rate:  [22 bmp-24 bmp] 22 bmp Vt Set:  [540 mL] 540 mL PEEP:  [5 cmH20-8 cmH20] 5 cmH20 Plateau Pressure:  [16 cmH20-20 cmH20] 17 cmH20  Physical Exam:  Gen: comfortable, no distress Neuro: interactive HEENT: PERRL Neck: supple CV: RRR Pulm: unlabored breathing Abd: soft, NT GU: clear yellow urine Extr: wwp, no edema   Results for orders placed or performed during the hospital encounter of 08/05/21 (from the past 24 hour(s))  Glucose, capillary     Status: Abnormal   Collection Time: 08/20/21  3:11 AM  Result Value Ref Range   Glucose-Capillary 146 (H) 70 - 99 mg/dL  CBC     Status: Abnormal   Collection Time: 08/20/21  3:39 AM  Result Value Ref Range   WBC 10.3 4.0 - 10.5 K/uL   RBC 3.65 (L) 4.22 - 5.81 MIL/uL   Hemoglobin 11.0 (L) 13.0 - 17.0 g/dL   HCT 35.5 (L) 73.2 - 20.2 %   MCV 96.7 80.0 - 100.0 fL   MCH 30.1 26.0 - 34.0 pg   MCHC 31.2 30.0 - 36.0 g/dL   RDW 54.2 (H) 70.6 - 23.7 %   Platelets 306 150 - 400 K/uL   nRBC 0.4 (H) 0.0 - 0.2 %  Basic metabolic panel     Status: Abnormal   Collection Time: 08/20/21  3:39 AM   Result Value Ref Range   Sodium 147 (H) 135 - 145 mmol/L   Potassium 2.9 (L) 3.5 - 5.1 mmol/L   Chloride 107 98 - 111 mmol/L   CO2 30 22 - 32 mmol/L   Glucose, Bld 156 (H) 70 - 99 mg/dL   BUN 71 (H) 6 - 20 mg/dL   Creatinine, Ser 6.28 0.61 - 1.24 mg/dL   Calcium 8.3 (L) 8.9 - 10.3 mg/dL   GFR, Estimated >31 >51 mL/min   Anion gap 10 5 - 15  Magnesium     Status: Abnormal   Collection Time: 08/20/21  3:39 AM  Result Value Ref Range   Magnesium 3.0 (H) 1.7 - 2.4 mg/dL  Glucose, capillary     Status: Abnormal   Collection Time: 08/20/21  7:28 AM  Result Value Ref Range   Glucose-Capillary 150 (H) 70 - 99 mg/dL  Glucose, capillary     Status: Abnormal   Collection Time: 08/20/21 11:25 AM  Result Value Ref Range   Glucose-Capillary 167 (H) 70 - 99 mg/dL  I-STAT 7, (LYTES, BLD GAS, ICA, H+H)     Status: Abnormal   Collection Time: 08/20/21 11:59 AM  Result Value Ref Range   pH, Arterial 7.464 (H) 7.35 - 7.45  pCO2 arterial 49.5 (H) 32 - 48 mmHg   pO2, Arterial 86 83 - 108 mmHg   Bicarbonate 35.3 (H) 20.0 - 28.0 mmol/L   TCO2 37 (H) 22 - 32 mmol/L   O2 Saturation 96 %   Acid-Base Excess 10.0 (H) 0.0 - 2.0 mmol/L   Sodium 150 (H) 135 - 145 mmol/L   Potassium 4.8 3.5 - 5.1 mmol/L   Calcium, Ion 1.20 1.15 - 1.40 mmol/L   HCT 31.0 (L) 39.0 - 52.0 %   Hemoglobin 10.5 (L) 13.0 - 17.0 g/dL   Patient temperature 76.1 C    Collection site art line    Drawn by Nurse    Sample type ARTERIAL   Glucose, capillary     Status: Abnormal   Collection Time: 08/20/21  3:12 PM  Result Value Ref Range   Glucose-Capillary 143 (H) 70 - 99 mg/dL  CBC     Status: Abnormal   Collection Time: 08/20/21  7:37 PM  Result Value Ref Range   WBC 7.9 4.0 - 10.5 K/uL   RBC 3.32 (L) 4.22 - 5.81 MIL/uL   Hemoglobin 10.0 (L) 13.0 - 17.0 g/dL   HCT 95.0 (L) 93.2 - 67.1 %   MCV 99.4 80.0 - 100.0 fL   MCH 30.1 26.0 - 34.0 pg   MCHC 30.3 30.0 - 36.0 g/dL   RDW 24.5 (H) 80.9 - 98.3 %   Platelets 338 150 -  400 K/uL   nRBC 0.0 0.0 - 0.2 %  Basic metabolic panel     Status: Abnormal   Collection Time: 08/20/21  7:37 PM  Result Value Ref Range   Sodium 153 (H) 135 - 145 mmol/L   Potassium 4.0 3.5 - 5.1 mmol/L   Chloride 113 (H) 98 - 111 mmol/L   CO2 31 22 - 32 mmol/L   Glucose, Bld 181 (H) 70 - 99 mg/dL   BUN 69 (H) 6 - 20 mg/dL   Creatinine, Ser 3.82 0.61 - 1.24 mg/dL   Calcium 8.9 8.9 - 50.5 mg/dL   GFR, Estimated >39 >76 mL/min   Anion gap 9 5 - 15  Glucose, capillary     Status: Abnormal   Collection Time: 08/20/21  8:11 PM  Result Value Ref Range   Glucose-Capillary 147 (H) 70 - 99 mg/dL    Assessment & Plan: The plan of care was discussed with the bedside nurse for the day, who is in agreement with this plan and no additional concerns were raised.   Present on Admission:  Trauma of chest  ARDS (adult respiratory distress syndrome) ([name])  Contusion of left lung  Acute on chronic respiratory failure with hypoxia and hypercapnia ([name])  Critical polytrauma  TBI (traumatic brain injury) ([name])    LOS: 15 days   Additional comments:I reviewed the patient's new clinical lab test results.   and I reviewed the patients new imaging test results.    51M [facility]   SDH, IVH, SAH - NSGY c/s, Dr. Maurice Small, repeat CT head 7/12 some IVH but fairly stable, okay for above normal sodiums and for heparin as long as no bolus dosing. Head CT 7/16 with edema, but improvement in IVH. No midline shift. L rib fx 1-8 with L HPTX - L CT to water seal today Acute hypoxic ventilator dependent respiratory failure - doing well off ECMO, wean PEEP to 10 and maybe 8 later today. Fracture dislocation L elbow/olecranon and ulnar styloid with comminuted fracture of the left midshaft and distal humerus -  initial eval by Dr. Linna Caprice, splinted, definitive care per Dr. Derinda Sis Trauma, OR 7/13 with Dr. Jena Gauss I&D, CR L humerus. OR 7/20 with Dr. Carola Frost for ORIF Comminuted fracture dislocation R hip - reduced in TB,  KI in place, traction pin 7/13 by Dr. Jena Gauss.  Will need closed management with traction due to proximity of soft tissue wound.  Complex lacerations RUE/RLE with degloving - operative washout and repair 7/13 by Dr. Jena Gauss, vac to RLE, proximity to R hip makes fixation high risk. OR for washout 7/18 with Dr. Carola Frost. WOC c/s for vac changes MWF, to start 7/24 R comminuted first metacarpal fracture/open - irrigated and splinted by Dr. 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 I have reviewed this patient's available data, including medical history, events of note, physical examination and test results as part of my evaluation.

## 2021-08-21 ENCOUNTER — Inpatient Hospital Stay (HOSPITAL_COMMUNITY): Payer: Medicaid Other

## 2021-08-21 DIAGNOSIS — S73004A Unspecified dislocation of right hip, initial encounter: Secondary | ICD-10-CM

## 2021-08-21 DIAGNOSIS — S62241B Displaced fracture of shaft of first metacarpal bone, right hand, initial encounter for open fracture: Secondary | ICD-10-CM

## 2021-08-21 DIAGNOSIS — S32421A Displaced fracture of posterior wall of right acetabulum, initial encounter for closed fracture: Secondary | ICD-10-CM

## 2021-08-21 DIAGNOSIS — S42362A Displaced segmental fracture of shaft of humerus, left arm, initial encounter for closed fracture: Secondary | ICD-10-CM

## 2021-08-21 DIAGNOSIS — S81801A Unspecified open wound, right lower leg, initial encounter: Secondary | ICD-10-CM

## 2021-08-21 HISTORY — DX: Displaced segmental fracture of shaft of humerus, left arm, initial encounter for closed fracture: S42.362A

## 2021-08-21 HISTORY — DX: Displaced fracture of posterior wall of right acetabulum, initial encounter for closed fracture: S32.421A

## 2021-08-21 LAB — CBC
HCT: 34.5 % — ABNORMAL LOW (ref 39.0–52.0)
Hemoglobin: 10.2 g/dL — ABNORMAL LOW (ref 13.0–17.0)
MCH: 29.6 pg (ref 26.0–34.0)
MCHC: 29.6 g/dL — ABNORMAL LOW (ref 30.0–36.0)
MCV: 100 fL (ref 80.0–100.0)
Platelets: 395 10*3/uL (ref 150–400)
RBC: 3.45 MIL/uL — ABNORMAL LOW (ref 4.22–5.81)
RDW: 18.3 % — ABNORMAL HIGH (ref 11.5–15.5)
WBC: 8.4 10*3/uL (ref 4.0–10.5)
nRBC: 0.2 % (ref 0.0–0.2)

## 2021-08-21 LAB — BASIC METABOLIC PANEL
Anion gap: 7 (ref 5–15)
BUN: 61 mg/dL — ABNORMAL HIGH (ref 6–20)
CO2: 30 mmol/L (ref 22–32)
Calcium: 9 mg/dL (ref 8.9–10.3)
Chloride: 113 mmol/L — ABNORMAL HIGH (ref 98–111)
Creatinine, Ser: 1.01 mg/dL (ref 0.61–1.24)
GFR, Estimated: >60 mL/min (ref >60)
Glucose, Bld: 149 mg/dL — ABNORMAL HIGH (ref 70–99)
Potassium: 3.6 mmol/L (ref 3.5–5.1)
Sodium: 150 mmol/L — ABNORMAL HIGH (ref 135–145)

## 2021-08-21 LAB — TYPE AND SCREEN
ABO/RH(D): A POS
Antibody Screen: NEGATIVE
Unit division: 0
Unit division: 0
Unit division: 0
Unit division: 0

## 2021-08-21 LAB — BPAM RBC
Blood Product Expiration Date: 202308062359
Blood Product Expiration Date: 202308062359
Blood Product Expiration Date: 202308112359
Blood Product Expiration Date: 202308112359
Unit Type and Rh: 6200
Unit Type and Rh: 6200
Unit Type and Rh: 6200
Unit Type and Rh: 6200

## 2021-08-21 LAB — GLUCOSE, CAPILLARY
Glucose-Capillary: 157 mg/dL — ABNORMAL HIGH (ref 70–99)
Glucose-Capillary: 123 mg/dL — ABNORMAL HIGH (ref 70–99)
Glucose-Capillary: 210 mg/dL — ABNORMAL HIGH (ref 70–99)
Glucose-Capillary: 79 mg/dL (ref 70–99)
Glucose-Capillary: 163 mg/dL — ABNORMAL HIGH (ref 70–99)
Glucose-Capillary: 128 mg/dL — ABNORMAL HIGH (ref 70–99)

## 2021-08-21 LAB — POCT I-STAT 7, (LYTES, BLD GAS, ICA,H+H)
Acid-Base Excess: 7 mmol/L — ABNORMAL HIGH (ref 0.0–2.0)
Bicarbonate: 31.5 mmol/L — ABNORMAL HIGH (ref 20.0–28.0)
Calcium, Ion: 1.21 mmol/L (ref 1.15–1.40)
HCT: 31 % — ABNORMAL LOW (ref 39.0–52.0)
Hemoglobin: 10.5 g/dL — ABNORMAL LOW (ref 13.0–17.0)
O2 Saturation: 96 %
Patient temperature: 38.5
Potassium: 3.7 mmol/L (ref 3.5–5.1)
Sodium: 151 mmol/L — ABNORMAL HIGH (ref 135–145)
TCO2: 33 mmol/L — ABNORMAL HIGH (ref 22–32)
pCO2 arterial: 46.7 mmHg (ref 32–48)
pH, Arterial: 7.443 (ref 7.35–7.45)
pO2, Arterial: 83 mmHg (ref 83–108)

## 2021-08-21 MED ORDER — SULFAMETHOXAZOLE-TRIMETHOPRIM 800-160 MG PO TABS
2.0000 | ORAL_TABLET | Freq: Two times a day (BID) | ORAL | Status: DC
  Filled 2021-08-21: qty 2

## 2021-08-21 MED ORDER — METOPROLOL TARTRATE 25 MG/10 ML ORAL SUSPENSION
50.0000 mg | Freq: Two times a day (BID) | ORAL | Status: DC
  Administered 2021-08-21 – 2021-08-30 (×19): 50 mg
  Filled 2021-08-21 (×20): qty 20

## 2021-08-21 MED ORDER — ZINC SULFATE 220 (50 ZN) MG PO CAPS
220.0000 mg | ORAL_CAPSULE | Freq: Every day | ORAL | Status: DC
  Administered 2021-08-22 – 2021-09-08 (×18): 220 mg
  Filled 2021-08-21 (×18): qty 1

## 2021-08-21 MED ORDER — MINOCYCLINE HCL 50 MG PO CAPS
200.0000 mg | ORAL_CAPSULE | Freq: Two times a day (BID) | ORAL | Status: DC
  Filled 2021-08-21 (×2): qty 4
  Filled 2021-08-21: qty 2

## 2021-08-21 MED ORDER — MIDAZOLAM HCL 2 MG/2ML IJ SOLN
2.0000 mg | INTRAMUSCULAR | Status: DC | PRN
  Administered 2021-08-21 – 2021-09-08 (×21): 2 mg via INTRAVENOUS
  Filled 2021-08-21 (×22): qty 2

## 2021-08-21 MED ORDER — FREE WATER
200.0000 mL | Status: DC
  Administered 2021-08-21 – 2021-08-25 (×50): 200 mL

## 2021-08-21 MED ORDER — MINOCYCLINE HCL 50 MG PO CAPS
200.0000 mg | ORAL_CAPSULE | Freq: Two times a day (BID) | ORAL | Status: DC
  Administered 2021-08-21 – 2021-08-29 (×17): 200 mg
  Filled 2021-08-21 (×18): qty 4

## 2021-08-21 MED ORDER — QUETIAPINE FUMARATE 200 MG PO TABS
200.0000 mg | ORAL_TABLET | Freq: Three times a day (TID) | ORAL | Status: DC
  Administered 2021-08-21 – 2021-08-22 (×4): 200 mg
  Filled 2021-08-21 (×2): qty 1
  Filled 2021-08-21 (×2): qty 2

## 2021-08-21 MED ORDER — SULFAMETHOXAZOLE-TRIMETHOPRIM 800-160 MG PO TABS
2.0000 | ORAL_TABLET | Freq: Two times a day (BID) | ORAL | Status: DC
  Administered 2021-08-21 – 2021-08-30 (×19): 2
  Filled 2021-08-21 (×22): qty 2

## 2021-08-21 MED ORDER — SULFAMETHOXAZOLE-TRIMETHOPRIM 800-160 MG PO TABS
1.0000 | ORAL_TABLET | Freq: Two times a day (BID) | ORAL | Status: DC
  Filled 2021-08-21 (×2): qty 1

## 2021-08-21 NOTE — Plan of Care (Signed)
Patient transported from 2H to 4N on the vent without issue. RN and RT bedside.

## 2021-08-21 NOTE — Progress Notes (Signed)
RT attempted ATC trial per MD. Pt not tolerating trach col. at this time due to tachypnea, increased WOB and desat. Pt placed back on vent in psv 8/5 40%,vitals stable, pt tolerating well at this time. MD aware, RN at bedside, RT will monitor.

## 2021-08-21 NOTE — Progress Notes (Signed)
Pt's father Ruel Favors at bedside.

## 2021-08-21 NOTE — Progress Notes (Signed)
Patient ID: Jeff [name], male   DOB: [birth year], 21 y.o.   MRN: [id number] Family updated at bedside.  Violeta Gelinas, MD, MPH, FACS Please use [email or web address] to contact on call provider

## 2021-08-21 NOTE — Progress Notes (Addendum)
Patient ID: Jeff [name], male   DOB: 05-02-2000, 21 y.o.   MRN: [id number] Follow up - Trauma Critical Care   Patient Details:    Jeff [name] is an 21 y.o. male.  Lines/tubes : CVC Triple Lumen 08/19/21 Left Femoral 20 cm (Active)  Indication for Insertion or Continuance of Line Vasoactive infusions 08/20/21 2000  Site Assessment Clean, Dry, Intact 08/20/21 1600  Proximal Lumen Status Infusing 08/20/21 2000  Medial Lumen Status Infusing 08/20/21 2000  Distal Lumen Status Infusing 08/20/21 2000  Dressing Type Transparent 08/20/21 2000  Dressing Status Antimicrobial disc in place;Clean, Dry, Intact 08/20/21 2000  Dressing Intervention Dressing changed;Antimicrobial disc changed 08/19/21 1400  Dressing Change Due 08/26/21 08/20/21 1600     Arterial Line 08/05/21 Left Femoral (Active)  Site Assessment Clean, Dry, Intact 08/20/21 2000  Line Status Pulsatile blood flow 08/20/21 2000  Art Line Waveform Appropriate 08/20/21 2000  Art Line Interventions Zeroed and calibrated;Leveled;Flushed per protocol 08/20/21 2000  Color/Movement/Sensation Capillary refill less than 3 sec 08/20/21 2000  Dressing Type Transparent 08/20/21 2000  Dressing Status Clean, Dry, Intact 08/20/21 2000  Interventions Dressing changed;Antimicrobial disc changed 08/19/21 1400  Dressing Change Due 08/26/21 08/20/21 1600     Chest Tube 1 Lateral;Left Pleural (Active)  Status To water seal 08/20/21 2000  Chest Tube Air Leak None 08/20/21 2000  Patency Intervention Tip/tilt 08/20/21 2000  Drainage Description Dark red 08/20/21 2000  Dressing Status Clean, Dry, Intact 08/20/21 2000  Dressing Intervention Dressing changed 08/20/21 1600  Site Assessment Other (Comment) 08/19/21 0800  Surrounding Skin Unable to view 08/20/21 1600  Intake (mL) 20 mL 08/18/21 1600  Output (mL) 0 mL 08/20/21 2200     Negative Pressure Wound Therapy Thigh Right;Upper (Active)  Wound Image      08/09/21 2000  Last  dressing change 08/20/21 08/20/21 1600  Site / Wound Assessment Dressing in place / Unable to assess 08/20/21 2000  Peri-wound Assessment Intact;Pink 08/18/21 1345  Wound filler - Black foam 1 08/18/21 1345  Cycle Continuous 08/20/21 1600  Target Pressure (mmHg) 125 08/20/21 1600  Instillation Volume 0 mL 08/13/21 0818  Canister Changed Yes 08/20/21 1600  Machine plugged into wall outlet (NOT bed outlet) Yes 08/20/21 1600  Dressing Status Intact 08/20/21 2000  Drainage Amount Minimal 08/20/21 1600  Drainage Description Sanguineous 08/20/21 1600  Output (mL) 5 mL 08/21/21 0400     Flatus Tube/Pouch (Active)  Daily care Skin around tube assessed 08/20/21 1600  Output (mL) 150 mL 08/21/21 0600  Intake (mL) 30 mL 08/11/21 1700     External Urinary Catheter (Active)  Collection Container Dedicated Suction Canister 08/20/21 1600  Suction (Verified suction is between 40-80 mmHg) Yes 08/20/21 1600  Securement Method Securing device (Describe) 08/20/21 1600  Site Assessment Clean, Dry, Intact 08/20/21 1600  Intervention Male External Urinary Catheter Replaced 08/20/21 1600  Output (mL) 300 mL 08/21/21 0600    Microbiology/Sepsis markers: Results for orders placed or performed during the hospital encounter of 08/05/21  MRSA Next Gen by PCR, Nasal     Status: None   Collection Time: 08/06/21  2:58 AM   Specimen: Nasal Mucosa; Nasal Swab  Result Value Ref Range Status   MRSA by PCR Next Gen NOT DETECTED NOT DETECTED Final    Comment: (NOTE) The GeneXpert MRSA Assay (FDA approved for NASAL specimens only), is one component of a comprehensive MRSA colonization surveillance program. It is not intended to diagnose MRSA infection nor to guide or monitor treatment for  MRSA infections. Test performance is not FDA approved in patients less than 5 years old. Performed at [facility], [address], [place], [place] [address]   Resp Panel by RT-PCR (Flu A&B, Covid) Anterior Nasal Swab      Status: None   Collection Time: 08/06/21  3:45 AM   Specimen: Anterior Nasal Swab  Result Value Ref Range Status   SARS Coronavirus 2 by RT PCR NEGATIVE NEGATIVE Final    Comment: (NOTE) SARS-CoV-2 target nucleic acids are NOT DETECTED.  The SARS-CoV-2 RNA is generally detectable in upper respiratory specimens during the acute phase of infection. The lowest concentration of SARS-CoV-2 viral copies this assay can detect is 138 copies/mL. A negative result does not preclude SARS-Cov-2 infection and should not be used as the sole basis for treatment or other patient management decisions. A negative result may occur with  improper specimen collection/handling, submission of specimen other than nasopharyngeal swab, presence of viral mutation(s) within the areas targeted by this assay, and inadequate number of viral copies(<138 copies/mL). A negative result must be combined with clinical observations, patient history, and epidemiological information. The expected result is Negative.  Fact Sheet for Patients:  BloggerCourse.com  Fact Sheet for Healthcare Providers:  SeriousBroker.it  This test is no t yet approved or cleared by the [place] FDA and  has been authorized for detection and/or diagnosis of SARS-CoV-2 by FDA under an Emergency Use Authorization (EUA). This EUA will remain  in effect (meaning this test can be used) for the duration of the COVID-19 declaration under Section 564(b)(1) of the Act, 21 U.S.C.section 360bbb-3(b)(1), unless the authorization is terminated  or revoked sooner.       Influenza A by PCR NEGATIVE NEGATIVE Final   Influenza B by PCR NEGATIVE NEGATIVE Final    Comment: (NOTE) The Xpert Xpress SARS-CoV-2/FLU/RSV plus assay is intended as an aid in the diagnosis of influenza from Nasopharyngeal swab specimens and should not be used as a sole basis for treatment. Nasal washings and aspirates are  unacceptable for Xpert Xpress SARS-CoV-2/FLU/RSV testing.  Fact Sheet for Patients: BloggerCourse.com  Fact Sheet for Healthcare Providers: SeriousBroker.it  This test is not yet approved or cleared by the [place] FDA and has been authorized for detection and/or diagnosis of SARS-CoV-2 by FDA under an Emergency Use Authorization (EUA). This EUA will remain in effect (meaning this test can be used) for the duration of the COVID-19 declaration under Section 564(b)(1) of the Act, 21 U.S.C. section 360bbb-3(b)(1), unless the authorization is terminated or revoked.  Performed at [facility], [address], [place], [place] [address]   Culture, Respiratory w Gram Stain     Status: None   Collection Time: 08/07/21  8:01 PM   Specimen: Tracheal Aspirate; Respiratory  Result Value Ref Range Status   Specimen Description TRACHEAL ASPIRATE  Final   Special Requests NONE  Final   Gram Stain   Final    RARE WBC PRESENT,BOTH PMN AND MONONUCLEAR NO ORGANISMS SEEN    Culture   Final    NO GROWTH 2 DAYS Performed at [facility], [address], [place], [place] [address]    Report Status 08/10/2021 FINAL  Final  Culture, blood (Routine X 2) w Reflex to ID Panel     Status: None (Preliminary result)   Collection Time: 08/18/21  6:35 AM   Specimen: BLOOD  Result Value Ref Range Status   Specimen Description BLOOD FOOT  Final  Special Requests   Final    BOTTLES DRAWN AEROBIC AND ANAEROBIC Blood Culture adequate volume   Culture   Final    NO GROWTH 2 DAYS Performed at [facility], [address] [place], [place] [address]    Report Status PENDING  Incomplete  Culture, blood (Routine X 2) w Reflex to ID Panel     Status: None (Preliminary result)   Collection Time: 08/18/21  6:41 AM   Specimen: BLOOD  Result Value Ref Range Status   Specimen Description BLOOD FOOT  Final   Special Requests   Final     BOTTLES DRAWN AEROBIC AND ANAEROBIC Blood Culture adequate volume   Culture   Final    NO GROWTH 2 DAYS Performed at [facility], [address] [place], [place] [address]    Report Status PENDING  Incomplete  Culture, Respiratory w Gram Stain     Status: None (Preliminary result)   Collection Time: 08/18/21  7:46 AM   Specimen: Tracheal Aspirate; Respiratory  Result Value Ref Range Status   Specimen Description EXPECTORATED SPUTUM  Final   Special Requests NONE  Final   Gram Stain   Final    FEW WBC PRESENT, PREDOMINANTLY PMN RARE GRAM NEGATIVE RODS RARE YEAST WITH PSEUDOHYPHAE Performed at [facility], [address] [place], [place] [address]    Culture FEW STENOTROPHOMONAS MALTOPHILIA  Final   Report Status PENDING  Incomplete    Anti-infectives:  Anti-infectives (From admission, onward)    Start     Dose/Rate Route Frequency Ordered Stop   08/21/21 1200  fluconazole (DIFLUCAN) IVPB 400 mg       See Hyperspace for full Linked Orders Report.   400 mg 100 mL/hr over 120 Minutes Intravenous Every 24 hours 08/20/21 1543     08/21/21 1000  fluconazole (DIFLUCAN) IVPB 400 mg       See Hyperspace for full Linked Orders Report.   400 mg 100 mL/hr over 120 Minutes Intravenous Every 24 hours 08/20/21 1543     08/20/21 0900  vancomycin (VANCOCIN) IVPB 1000 mg/200 mL premix        1,000 mg 200 mL/hr over 60 Minutes Intravenous Every 12 hours 08/19/21 2050     08/19/21 2130  vancomycin (VANCOREADY) IVPB 1500 mg/300 mL        1,500 mg 150 mL/hr over 120 Minutes Intravenous STAT 08/19/21 2041 08/20/21 0100   08/19/21 2045  metroNIDAZOLE (FLAGYL) IVPB 500 mg        500 mg 100 mL/hr over 60 Minutes Intravenous Every 12 hours 08/19/21 2040     08/19/21 0900  fluconazole (DIFLUCAN) IVPB 800 mg  Status:  Discontinued        800 mg 100 mL/hr over 240 Minutes Intravenous Every 24 hours 08/19/21 0809 08/20/21 1540   08/18/21 1115  ceFEPIme (MAXIPIME) 2 g in sodium chloride  0.9 % 100 mL IVPB        2 g 200 mL/hr over 30 Minutes Intravenous Every 8 hours 08/18/21 1017     08/13/21 2000  vancomycin (VANCOREADY) IVPB 1500 mg/300 mL  Status:  Discontinued        1,500 mg 150 mL/hr over 120 Minutes Intravenous Every 12 hours 08/13/21 1242 08/15/21 0952   08/10/21 1000  vancomycin (VANCOREADY) IVPB 1500 mg/300 mL  Status:  Discontinued        1,500 mg 150 mL/hr over 120 Minutes Intravenous Every 24 hours 08/09/21 0820 08/13/21 1242  08/09/21 0915  vancomycin (VANCOREADY) IVPB 2000 mg/400 mL        2,000 mg 200 mL/hr over 120 Minutes Intravenous  Once 08/09/21 0820 08/09/21 1050   08/09/21 0830  vancomycin (VANCOREADY) IVPB 1750 mg/350 mL  Status:  Discontinued        1,750 mg 175 mL/hr over 120 Minutes Intravenous  Once 08/09/21 0733 08/09/21 0820   08/07/21 1105  tobramycin (NEBCIN) powder  Status:  Discontinued          As needed 08/07/21 1106 08/07/21 1224   08/07/21 1104  vancomycin (VANCOCIN) powder  Status:  Discontinued          As needed 08/07/21 1105 08/07/21 1224   08/06/21 1000  vancomycin (VANCOREADY) IVPB 750 mg/150 mL  Status:  Discontinued        750 mg 150 mL/hr over 60 Minutes Intravenous Every 12 hours 08/05/21 2229 08/05/21 2313   08/06/21 0600  meropenem (MERREM) 1 g in sodium chloride 0.9 % 100 mL IVPB  Status:  Discontinued        1 g 200 mL/hr over 30 Minutes Intravenous Every 8 hours 08/05/21 2352 08/17/21 1043   08/06/21 0015  vancomycin (VANCOREADY) IVPB 750 mg/150 mL        750 mg 150 mL/hr over 60 Minutes Intravenous  Once 08/05/21 2313 08/06/21 0208   08/05/21 2317  vancomycin variable dose per unstable renal function (pharmacist dosing)  Status:  Discontinued         Does not apply See admin instructions 08/05/21 2317 08/06/21 0917   08/05/21 2215  Ampicillin-Sulbactam (UNASYN) 3 g in sodium chloride 0.9 % 100 mL IVPB  Status:  Discontinued        3 g 200 mL/hr over 30 Minutes Intravenous Every 6 hours 08/05/21 2209 08/05/21 2351    08/05/21 2215  vancomycin (VANCOCIN) IVPB 1000 mg/200 mL premix        1,000 mg 200 mL/hr over 60 Minutes Intravenous  Once 08/05/21 2213 08/05/21 2217   08/05/21 1815  ceFAZolin (ANCEF) IVPB 2g/100 mL premix        2 g 200 mL/hr over 30 Minutes Intravenous NOW 08/05/21 1801 08/05/21 1936      Consults: Treatment Team:  Roby Lofts, MD Myrene Galas, MD    Studies:    Events:  Subjective:    Overnight Issues:   Objective:  Vital signs for last 24 hours: Temp:  [98.6 F (37 C)-100.9 F (38.3 C)] 100.9 F (38.3 C) (07/27 0400) Pulse Rate:  [120-154] 152 (07/27 0800) Resp:  [19-32] 20 (07/27 0800) SpO2:  [92 %-100 %] 94 % (07/27 0800) Arterial Line BP: (114-162)/(57-84) 148/76 (07/27 0700) FiO2 (%):  [40 %] 40 % (07/27 0800) Weight:  [77.7 kg] 77.7 kg (07/27 0600)  Hemodynamic parameters for last 24 hours:    Intake/Output from previous day: 07/26 0701 - 07/27 0700 In: 3360.5 [I.V.:99.8; NG/GT:2025; IV Piggyback:1235.8] Out: 4810 [Urine:3800; Drains:10; Stool:1000]  Intake/Output this shift: No intake/output data recorded.  Vent settings for last 24 hours: Vent Mode: PSV;CPAP FiO2 (%):  [40 %] 40 % Set Rate:  [22 bmp-24 bmp] 22 bmp Vt Set:  [540 mL] 540 mL PEEP:  [5 cmH20] 5 cmH20 Pressure Support:  [8 cmH20] 8 cmH20 Plateau Pressure:  [16 cmH20-17 cmH20] 16 cmH20  Physical Exam:  General: on vent Neuro: arouses and F/C HEENT/Neck: trach-clean, intact Resp: clear to auscultation bilaterally CVS: RRR GI: soft, nt Extremities: TXN RLE, ortho dressings  Results  for orders placed or performed during the hospital encounter of 08/05/21 (from the past 24 hour(s))  Glucose, capillary     Status: Abnormal   Collection Time: 08/20/21 11:25 AM  Result Value Ref Range   Glucose-Capillary 167 (H) 70 - 99 mg/dL  I-STAT 7, (LYTES, BLD GAS, ICA, H+H)     Status: Abnormal   Collection Time: 08/20/21 11:59 AM  Result Value Ref Range   pH, Arterial 7.464  (H) 7.35 - 7.45   pCO2 arterial 49.5 (H) 32 - 48 mmHg   pO2, Arterial 86 83 - 108 mmHg   Bicarbonate 35.3 (H) 20.0 - 28.0 mmol/L   TCO2 37 (H) 22 - 32 mmol/L   O2 Saturation 96 %   Acid-Base Excess 10.0 (H) 0.0 - 2.0 mmol/L   Sodium 150 (H) 135 - 145 mmol/L   Potassium 4.8 3.5 - 5.1 mmol/L   Calcium, Ion 1.20 1.15 - 1.40 mmol/L   HCT 31.0 (L) 39.0 - 52.0 %   Hemoglobin 10.5 (L) 13.0 - 17.0 g/dL   Patient temperature 48.5 C    Collection site art line    Drawn by Nurse    Sample type ARTERIAL   Glucose, capillary     Status: Abnormal   Collection Time: 08/20/21  3:12 PM  Result Value Ref Range   Glucose-Capillary 143 (H) 70 - 99 mg/dL  CBC     Status: Abnormal   Collection Time: 08/20/21  7:37 PM  Result Value Ref Range   WBC 7.9 4.0 - 10.5 K/uL   RBC 3.32 (L) 4.22 - 5.81 MIL/uL   Hemoglobin 10.0 (L) 13.0 - 17.0 g/dL   HCT 46.2 (L) 70.3 - 50.0 %   MCV 99.4 80.0 - 100.0 fL   MCH 30.1 26.0 - 34.0 pg   MCHC 30.3 30.0 - 36.0 g/dL   RDW 93.8 (H) 18.2 - 99.3 %   Platelets 338 150 - 400 K/uL   nRBC 0.0 0.0 - 0.2 %  Basic metabolic panel     Status: Abnormal   Collection Time: 08/20/21  7:37 PM  Result Value Ref Range   Sodium 153 (H) 135 - 145 mmol/L   Potassium 4.0 3.5 - 5.1 mmol/L   Chloride 113 (H) 98 - 111 mmol/L   CO2 31 22 - 32 mmol/L   Glucose, Bld 181 (H) 70 - 99 mg/dL   BUN 69 (H) 6 - 20 mg/dL   Creatinine, Ser 7.16 0.61 - 1.24 mg/dL   Calcium 8.9 8.9 - 96.7 mg/dL   GFR, Estimated >89 >38 mL/min   Anion gap 9 5 - 15  Glucose, capillary     Status: Abnormal   Collection Time: 08/20/21  8:11 PM  Result Value Ref Range   Glucose-Capillary 147 (H) 70 - 99 mg/dL  Glucose, capillary     Status: Abnormal   Collection Time: 08/20/21 11:30 PM  Result Value Ref Range   Glucose-Capillary 138 (H) 70 - 99 mg/dL  CBC     Status: Abnormal   Collection Time: 08/21/21  4:02 AM  Result Value Ref Range   WBC 8.4 4.0 - 10.5 K/uL   RBC 3.45 (L) 4.22 - 5.81 MIL/uL   Hemoglobin  10.2 (L) 13.0 - 17.0 g/dL   HCT 10.1 (L) 75.1 - 02.5 %   MCV 100.0 80.0 - 100.0 fL   MCH 29.6 26.0 - 34.0 pg   MCHC 29.6 (L) 30.0 - 36.0 g/dL   RDW 85.2 (H) 77.8 - 24.2 %  Platelets 395 150 - 400 K/uL   nRBC 0.2 0.0 - 0.2 %  Basic metabolic panel     Status: Abnormal   Collection Time: 08/21/21  4:02 AM  Result Value Ref Range   Sodium 150 (H) 135 - 145 mmol/L   Potassium 3.6 3.5 - 5.1 mmol/L   Chloride 113 (H) 98 - 111 mmol/L   CO2 30 22 - 32 mmol/L   Glucose, Bld 149 (H) 70 - 99 mg/dL   BUN 61 (H) 6 - 20 mg/dL   Creatinine, Ser 1.01 0.61 - 1.24 mg/dL   Calcium 9.0 8.9 - 75.1 mg/dL   GFR, Estimated >02 >58 mL/min   Anion gap 7 5 - 15  I-STAT 7, (LYTES, BLD GAS, ICA, H+H)     Status: Abnormal   Collection Time: 08/21/21  4:12 AM  Result Value Ref Range   pH, Arterial 7.443 7.35 - 7.45   pCO2 arterial 46.7 32 - 48 mmHg   pO2, Arterial 83 83 - 108 mmHg   Bicarbonate 31.5 (H) 20.0 - 28.0 mmol/L   TCO2 33 (H) 22 - 32 mmol/L   O2 Saturation 96 %   Acid-Base Excess 7.0 (H) 0.0 - 2.0 mmol/L   Sodium 151 (H) 135 - 145 mmol/L   Potassium 3.7 3.5 - 5.1 mmol/L   Calcium, Ion 1.21 1.15 - 1.40 mmol/L   HCT 31.0 (L) 39.0 - 52.0 %   Hemoglobin 10.5 (L) 13.0 - 17.0 g/dL   Patient temperature 52.7 C    Collection site art line    Drawn by Nurse    Sample type ARTERIAL   Glucose, capillary     Status: Abnormal   Collection Time: 08/21/21  4:15 AM  Result Value Ref Range   Glucose-Capillary 157 (H) 70 - 99 mg/dL  Glucose, capillary     Status: Abnormal   Collection Time: 08/21/21  7:24 AM  Result Value Ref Range   Glucose-Capillary 123 (H) 70 - 99 mg/dL    Assessment & Plan: Present on Admission:  Trauma of chest  ARDS (adult respiratory distress syndrome) ([name])  Contusion of left lung  Acute on chronic respiratory failure with hypoxia and hypercapnia ([name])  Critical polytrauma  TBI (traumatic brain injury) ([name])    LOS: 16 days   Additional comments:I reviewed the  patient's new clinical lab test results. / 80M [facility]   SDH, IVH, SAH - NSGY c/s, Dr. Maurice Small, repeat CT head 7/12 some IVH but fairly stable, okay for above normal sodiums and for heparin as long as no bolus dosing. Head CT 7/16 with edema, but improvement in IVH. No midline shift. L rib fx 1-8 with L HPTX - L CT to water seal today Acute hypoxic ventilator dependent respiratory failure - 8/5 weaning now, HTC as able Fracture dislocation L elbow/olecranon and ulnar styloid with comminuted fracture of the left midshaft and distal humerus - initial eval by Dr. Linna Caprice, splinted, definitive care per Dr. Derinda Sis Trauma, OR 7/13 with Dr. Jena Gauss I&D, CR L humerus. OR 7/20 with Dr. Carola Frost for ORIF Comminuted fracture dislocation R hip - reduced in TB, KI in place, traction pin 7/13 by Dr. Jena Gauss.  Will need closed management with traction - timing per Ortho Trauma Complex lacerations RUE/RLE with degloving - operative washout and repair 7/13 by Dr. Jena Gauss, vac to RLE, proximity to R hip makes fixation high risk. OR for washout 7/18 with Dr. Carola Frost. WOC c/s for vac changes MWF R comminuted first metacarpal fracture/open -  irrigated and splinted by Dr. Linna Caprice, hand surgery notified by EDP ID/Suspected aspiration - cefepime, flucon empiric 7/24, resp CX with hyphae, stenotrophomonas in resp cx - ? Bactrim. 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 I have reviewed this patient's available data, including medical history, events of note, physical examination and  test results as part of my evaluation.

## 2021-08-21 NOTE — Progress Notes (Signed)
PT Cancellation Note  Patient Details Name: Jeff [name] MRN: [id number] DOB: 2001/01/13   Cancelled Treatment:    Reason Eval/Treat Not Completed: Patient not medically ready (pt with HR 150 at rest when checking on pt at 0730 and 1015 with pt currently unable to tolerate eval. Per RN ortho requesting RUE and LLE AAROM as well as bil shoulder ROM)   Mry Lamia B Glynna Failla 08/21/2021, 10:20 AM Merryl Hacker, PT Acute Rehabilitation Services Office: [phone]

## 2021-08-21 NOTE — Progress Notes (Signed)
Pt transferred from 2 H to 4 [facility] ICU room 25. Bedside handoff received.

## 2021-08-21 NOTE — TOC CAGE-AID Note (Signed)
Transition of Care [facility]) - CAGE-AID Screening   Patient Details  Name: Jeff [name] MRN: [id number] Date of Birth: 08-25-2000  [facility] CM/SW Contact:    Carley Hammed, LCSWA Phone Number: 08/21/2021, 8:52 AM   Clinical Narrative: RN screened pt and noted pt is currently not appropriate for assessment.   CAGE-AID Screening: Substance Abuse Screening unable to be completed due to: : Patient unable to participate (intubated/paralyzed, ecmo)

## 2021-08-21 NOTE — Progress Notes (Signed)
OT Cancellation Note  Patient Details Name: Jeff [name] MRN: [id number] DOB: [birth year]   Cancelled Treatment:    Reason Eval/Treat Not Completed: Medical issues which prohibited therapy (Pt with elevated HR near 150s, will continue to follow.)  Evern Bio 08/21/2021, 11:37 AM Berna Spare, OTR/L Acute Rehabilitation Services Office: [phone]
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
## 2021-08-21 NOTE — Progress Notes (Signed)
Pt placed on psv/cpap by RT. Pt tolerating well at this time, RN at bedside, MD aware, RT will monitor.

## 2021-08-21 NOTE — Progress Notes (Signed)
Orthopedic Tech Progress Note Patient Details:  Jeff [name] Aug 26, 2000 [id number] Was called by RN to fix traction that was done incorrectly. Also to apply [facility] boot to RLE Patient ID: Jeff [name], male   DOB: 2000/10/17, 21 y.o.   MRN: [id number]  [name] 08/21/2021, 9:55 AM

## 2021-08-21 NOTE — TOC Progression Note (Signed)
Transition of Care [facility]) - Progression Note    Patient Details  Name: Jeff [name] MRN: [id number] Date of Birth: September 26, 2000  Transition of Care [facility]) CM/SW Contact  Glennon Mac, RN Phone Number: 08/21/2021, 3:55 PM  Clinical Narrative:    Trauma Case Manager continues to follow patient progress:  noted weaning on vent and plan for continued traction for two more weeks.   Patient discussed in multidisciplinary Trauma Rounds today; hopeful for eventual dc to CIR pending progress and medical stability. Will follow.   Expected Discharge Plan: IP Rehab Facility Barriers to Discharge: Continued Medical Work up  Expected Discharge Plan and Services Expected Discharge Plan: IP Rehab Facility   Discharge Planning Services: CM Consult   Living arrangements for the past 2 months: Apartment                                       Social Determinants of Health (SDOH) Interventions    Readmission Risk Interventions     No data to display         Quintella Baton, RN, BSN  Trauma/Neuro ICU Case Manager [phone]

## 2021-08-22 ENCOUNTER — Inpatient Hospital Stay (HOSPITAL_COMMUNITY): Payer: Medicaid Other

## 2021-08-22 DIAGNOSIS — R451 Restlessness and agitation: Secondary | ICD-10-CM | POA: Diagnosis not present

## 2021-08-22 DIAGNOSIS — S299XXA Unspecified injury of thorax, initial encounter: Secondary | ICD-10-CM | POA: Diagnosis not present

## 2021-08-22 LAB — BASIC METABOLIC PANEL
Anion gap: 8 (ref 5–15)
BUN: 45 mg/dL — ABNORMAL HIGH (ref 6–20)
CO2: 26 mmol/L (ref 22–32)
Calcium: 9 mg/dL (ref 8.9–10.3)
Chloride: 108 mmol/L (ref 98–111)
Creatinine, Ser: 1.07 mg/dL (ref 0.61–1.24)
GFR, Estimated: >60 mL/min (ref >60)
Glucose, Bld: 137 mg/dL — ABNORMAL HIGH (ref 70–99)
Potassium: 4 mmol/L (ref 3.5–5.1)
Sodium: 142 mmol/L (ref 135–145)

## 2021-08-22 LAB — GLUCOSE, CAPILLARY
Glucose-Capillary: 117 mg/dL — ABNORMAL HIGH (ref 70–99)
Glucose-Capillary: 132 mg/dL — ABNORMAL HIGH (ref 70–99)
Glucose-Capillary: 162 mg/dL — ABNORMAL HIGH (ref 70–99)
Glucose-Capillary: 118 mg/dL — ABNORMAL HIGH (ref 70–99)
Glucose-Capillary: 100 mg/dL — ABNORMAL HIGH (ref 70–99)
Glucose-Capillary: 112 mg/dL — ABNORMAL HIGH (ref 70–99)

## 2021-08-22 LAB — CULTURE, RESPIRATORY W GRAM STAIN

## 2021-08-22 LAB — HEPATIC FUNCTION PANEL
ALT: 68 U/L — ABNORMAL HIGH (ref 0–44)
AST: 91 U/L — ABNORMAL HIGH (ref 15–41)
Albumin: 2.7 g/dL — ABNORMAL LOW (ref 3.5–5.0)
Alkaline Phosphatase: 99 U/L (ref 38–126)
Bilirubin, Direct: 0.1 mg/dL (ref 0.0–0.2)
Indirect Bilirubin: 1 mg/dL — ABNORMAL HIGH (ref 0.3–0.9)
Total Bilirubin: 1.1 mg/dL (ref 0.3–1.2)
Total Protein: 6.4 g/dL — ABNORMAL LOW (ref 6.5–8.1)

## 2021-08-22 LAB — CBC
HCT: 34 % — ABNORMAL LOW (ref 39.0–52.0)
Hemoglobin: 10.4 g/dL — ABNORMAL LOW (ref 13.0–17.0)
MCH: 29.8 pg (ref 26.0–34.0)
MCHC: 30.6 g/dL (ref 30.0–36.0)
MCV: 97.4 fL (ref 80.0–100.0)
Platelets: 493 10*3/uL — ABNORMAL HIGH (ref 150–400)
RBC: 3.49 MIL/uL — ABNORMAL LOW (ref 4.22–5.81)
RDW: 17.7 % — ABNORMAL HIGH (ref 11.5–15.5)
WBC: 11.9 10*3/uL — ABNORMAL HIGH (ref 4.0–10.5)
nRBC: 0 % (ref 0.0–0.2)

## 2021-08-22 LAB — CK: Total CK: 2829 U/L — ABNORMAL HIGH (ref 49–397)

## 2021-08-22 MED ORDER — GABAPENTIN 250 MG/5ML PO SOLN
400.0000 mg | Freq: Three times a day (TID) | ORAL | Status: DC
  Administered 2021-08-22 – 2021-09-08 (×51): 400 mg
  Filled 2021-08-22 (×53): qty 8

## 2021-08-22 MED ORDER — FENTANYL CITRATE PF 50 MCG/ML IJ SOSY
50.0000 ug | PREFILLED_SYRINGE | INTRAMUSCULAR | Status: DC | PRN
  Administered 2021-08-23 – 2021-08-25 (×6): 50 ug via INTRAVENOUS
  Filled 2021-08-22 (×6): qty 1

## 2021-08-22 MED ORDER — CARBAMAZEPINE 200 MG PO TABS
200.0000 mg | ORAL_TABLET | Freq: Two times a day (BID) | ORAL | Status: DC
  Administered 2021-08-22 – 2021-08-25 (×7): 200 mg
  Filled 2021-08-22 (×8): qty 1

## 2021-08-22 MED ORDER — OXYCODONE HCL 5 MG/5ML PO SOLN
10.0000 mg | ORAL | Status: DC | PRN
  Administered 2021-08-22 – 2021-08-26 (×7): 15 mg
  Filled 2021-08-22 (×8): qty 15

## 2021-08-22 MED ORDER — KETOROLAC TROMETHAMINE 30 MG/ML IJ SOLN
30.0000 mg | Freq: Three times a day (TID) | INTRAMUSCULAR | Status: AC
  Administered 2021-08-22 – 2021-08-24 (×6): 30 mg via INTRAVENOUS
  Filled 2021-08-22 (×6): qty 1

## 2021-08-22 MED ORDER — DIAZEPAM 5 MG PO TABS
5.0000 mg | ORAL_TABLET | Freq: Four times a day (QID) | ORAL | Status: DC
  Administered 2021-08-22 (×3): 5 mg
  Filled 2021-08-22 (×3): qty 1

## 2021-08-22 MED ORDER — KETAMINE HCL 50 MG/5ML IJ SOSY
75.0000 mg | PREFILLED_SYRINGE | INTRAMUSCULAR | Status: DC | PRN
  Administered 2021-08-22 – 2021-08-27 (×9): 75 mg via INTRAVENOUS
  Filled 2021-08-22 (×10): qty 10

## 2021-08-22 MED ORDER — OLANZAPINE 5 MG PO TBDP
5.0000 mg | ORAL_TABLET | Freq: Two times a day (BID) | ORAL | Status: DC
  Filled 2021-08-22: qty 1

## 2021-08-22 MED ORDER — DIAZEPAM 5 MG PO TABS
5.0000 mg | ORAL_TABLET | Freq: Once | ORAL | Status: AC
  Administered 2021-08-22: 5 mg
  Filled 2021-08-22: qty 1

## 2021-08-22 MED ORDER — DIAZEPAM 5 MG PO TABS
10.0000 mg | ORAL_TABLET | Freq: Four times a day (QID) | ORAL | Status: DC
Start: 2021-08-23 — End: 2021-08-28
  Administered 2021-08-23 – 2021-08-28 (×22): 10 mg
  Filled 2021-08-22 (×22): qty 2

## 2021-08-22 MED ORDER — FUROSEMIDE 10 MG/ML IJ SOLN
40.0000 mg | Freq: Once | INTRAMUSCULAR | Status: AC
  Administered 2021-08-22: 40 mg via INTRAVENOUS
  Filled 2021-08-22: qty 4

## 2021-08-22 MED ORDER — HYDROMORPHONE HCL 1 MG/ML IJ SOLN
0.5000 mg | INTRAMUSCULAR | Status: DC | PRN
  Administered 2021-08-22 – 2021-08-29 (×34): 1 mg via INTRAVENOUS
  Administered 2021-08-29: 0.5 mg via INTRAVENOUS
  Administered 2021-08-29 – 2021-08-30 (×3): 1 mg via INTRAVENOUS
  Administered 2021-08-30 (×3): 0.5 mg via INTRAVENOUS
  Administered 2021-08-31: 1 mg via INTRAVENOUS
  Administered 2021-08-31: 0.5 mg via INTRAVENOUS
  Administered 2021-08-31 – 2021-09-10 (×34): 1 mg via INTRAVENOUS
  Administered 2021-09-10: 0.5 mg via INTRAVENOUS
  Administered 2021-09-10 – 2021-09-15 (×10): 1 mg via INTRAVENOUS
  Filled 2021-08-22 (×89): qty 1

## 2021-08-22 NOTE — Progress Notes (Addendum)
PT Cancellation Note  Patient Details Name: Jeff [name] MRN: [id number] DOB: 02/07/2000   Cancelled Treatment:    Reason Eval/Treat Not Completed: Patient not medically ready;Other (comment). RN reporting pt is agitated and with elevated HR of 146 bpm, requesting hold on therapy at this time. Will plan to follow-up later as time permits.  Addendum 12:47- RN reporting pt still agitated and requesting hold on PT today. Will plan to follow-up another day as able.   Raymond Gurney, PT, DPT Acute Rehabilitation Services  Office: [phone]    Jewel Baize 08/22/2021, 9:46 AM

## 2021-08-22 NOTE — Progress Notes (Signed)
Trauma Event Note   TRN to bedside to round, pt's agitation worsening. Primary RN expressing concerns related to patient's pain and muscle spasms. Constantly moving around in bed, head thrashing back and forth, Has received PRN and scheduled oxycodone, versed, valium, diluadid, robaxin and gabapentin since shift change with no improvement in VS/agitation. Ketamine given just prior to shift change with no change in agitation.   Dr. Andrey Campanile notified, verbal order to modify valium from 5MG  q6h to 10MG  q6h and given one time 5MG  dose now per tube. Added toradol and stated he planned to add fentanyl pushes PRN, awaiting that order.   Arterial BP 172/87   Last imported Vital Signs BP 134/69   Pulse (!) 145   Temp 100 F (37.8 C) (Oral)   Resp (!) 36   Ht 5\' 8"  (1.727 m)   Wt 171 lb 4.8 oz (77.7 kg)   SpO2 91%   BMI 26.05 kg/m   Trending CBC Recent Labs    08/20/21 1937 08/21/21 0402 08/21/21 0412 08/22/21 0714  WBC 7.9 8.4  --  11.9*  HGB 10.0* 10.2* 10.5* 10.4*  HCT 33.0* 34.5* 31.0* 34.0*  PLT 338 395  --  493*    Trending Coag's No results for input(s): "APTT", "INR" in the last 72 hours.  Trending BMET Recent Labs    08/20/21 1937 08/21/21 0402 08/21/21 0412 08/22/21 0714  NA 153* 150* 151* 142  K 4.0 3.6 3.7 4.0  CL 113* 113*  --  108  CO2 31 30  --  26  BUN 69* 61*  --  45*  CREATININE 0.97 1.01  --  1.07  GLUCOSE 181* 149*  --  137*      Jeff [name] Jeff [name] Jeff [name]  Trauma Response RN  Please call TRN at [phone] for further assistance.

## 2021-08-22 NOTE — Progress Notes (Signed)
OT Cancellation Note  Patient Details Name: Jeff [name] MRN: [id number] DOB: 2000/08/02   Cancelled Treatment:    Reason Eval/Treat Not Completed: Patient not medically ready. RN reporting pt is agitated and with elevated HR of 146 bpm, requesting hold on therapy at this time. Will plan to follow-up later as time permits.   Ignacia Palma, OTR/L Acute Rehab Services [facility] [phone] Office [phone]    Evette Georges 08/22/2021, 10:09 AM

## 2021-08-22 NOTE — Progress Notes (Signed)
Trauma/Critical Care Follow Up Note  Subjective:    Overnight Issues:   Objective:  Vital signs for last 24 hours: Temp:  [99.1 F (37.3 C)-102.2 F (39 C)] 100.8 F (38.2 C) (07/28 0800) Pulse Rate:  [123-147] 135 (07/28 1000) Resp:  [18-36] 30 (07/28 1000) BP: (131-137)/(61-67) 131/67 (07/28 0827) SpO2:  [92 %-99 %] 94 % (07/28 1000) Arterial Line BP: (109-151)/(57-82) 150/76 (07/28 1000) FiO2 (%):  [40 %] 40 % (07/28 0915)  Hemodynamic parameters for last 24 hours:    Intake/Output from previous day: 07/27 0701 - 07/28 0700 In: 2217.3 [I.V.:177.1; NG/GT:1385; IV Piggyback:355.2] Out: 1550 [Urine:1550]  Intake/Output this shift: Total I/O In: 265 [I.V.:40; NG/GT:225] Out: 475 [Urine:475]  Vent settings for last 24 hours: Vent Mode: PRVC FiO2 (%):  [40 %] 40 % Set Rate:  [22 bmp] 22 bmp Vt Set:  [540 mL] 540 mL PEEP:  [5 cmH20] 5 cmH20 Pressure Support:  [5 cmH20] 5 cmH20 Plateau Pressure:  [20 cmH20] 20 cmH20  Physical Exam:  Gen: comfortable, no distress Neuro: non-focal exam HEENT: PERRL Neck: supple CV: RRR Pulm: unlabored breathing Abd: soft, NT GU: clear yellow urine Extr: wwp, no edema   Results for orders placed or performed during the hospital encounter of 08/05/21 (from the past 24 hour(s))  Glucose, capillary     Status: None   Collection Time: 08/21/21  3:32 PM  Result Value Ref Range   Glucose-Capillary 79 70 - 99 mg/dL  Glucose, capillary     Status: Abnormal   Collection Time: 08/21/21  7:37 PM  Result Value Ref Range   Glucose-Capillary 163 (H) 70 - 99 mg/dL  Glucose, capillary     Status: Abnormal   Collection Time: 08/21/21 11:23 PM  Result Value Ref Range   Glucose-Capillary 128 (H) 70 - 99 mg/dL  Glucose, capillary     Status: Abnormal   Collection Time: 08/22/21  3:35 AM  Result Value Ref Range   Glucose-Capillary 117 (H) 70 - 99 mg/dL  Basic metabolic panel     Status: Abnormal   Collection Time: 08/22/21  7:14 AM   Result Value Ref Range   Sodium 142 135 - 145 mmol/L   Potassium 4.0 3.5 - 5.1 mmol/L   Chloride 108 98 - 111 mmol/L   CO2 26 22 - 32 mmol/L   Glucose, Bld 137 (H) 70 - 99 mg/dL   BUN 45 (H) 6 - 20 mg/dL   Creatinine, Ser 1.75 0.61 - 1.24 mg/dL   Calcium 9.0 8.9 - 10.2 mg/dL   GFR, Estimated >58 >52 mL/min   Anion gap 8 5 - 15  CBC     Status: Abnormal   Collection Time: 08/22/21  7:14 AM  Result Value Ref Range   WBC 11.9 (H) 4.0 - 10.5 K/uL   RBC 3.49 (L) 4.22 - 5.81 MIL/uL   Hemoglobin 10.4 (L) 13.0 - 17.0 g/dL   HCT 77.8 (L) 24.2 - 35.3 %   MCV 97.4 80.0 - 100.0 fL   MCH 29.8 26.0 - 34.0 pg   MCHC 30.6 30.0 - 36.0 g/dL   RDW 61.4 (H) 43.1 - 54.0 %   Platelets 493 (H) 150 - 400 K/uL   nRBC 0.0 0.0 - 0.2 %  Glucose, capillary     Status: Abnormal   Collection Time: 08/22/21  8:01 AM  Result Value Ref Range   Glucose-Capillary 132 (H) 70 - 99 mg/dL   Comment 1 Notify RN    Comment  2 Document in Chart   Glucose, capillary     Status: Abnormal   Collection Time: 08/22/21 11:37 AM  Result Value Ref Range   Glucose-Capillary 162 (H) 70 - 99 mg/dL    Assessment & Plan: The plan of care was discussed with the bedside nurse for the day, who is in agreement with this plan and no additional concerns were raised.   Present on Admission:  Trauma of chest  ARDS (adult respiratory distress syndrome) ([name])  Contusion of left lung  Acute on chronic respiratory failure with hypoxia and hypercapnia ([name])  Critical polytrauma  TBI (traumatic brain injury) ([name])    LOS: 17 days   Additional comments:I reviewed the patient's new clinical lab test results.   and I reviewed the patients new imaging test results.    81M [facility]   SDH, IVH, SAH - NSGY c/s, Dr. Maurice Small, repeat CT head 7/12 some IVH but fairly stable, head CT 7/16 with edema, but improvement in IVH. No midline shift. L rib fx 1-8 with L HPTX - L CT to water seal, remains due to high output Acute hypoxic ventilator  dependent respiratory failure - 8/5 weaning now, HTC as able Fracture dislocation L elbow/olecranon and ulnar styloid with comminuted fracture of the left midshaft and distal humerus - initial eval by Dr. Linna Caprice, splinted, definitive care per Dr. Derinda Sis Trauma, OR 7/13 with Dr. Jena Gauss I&D, CR L humerus. OR 7/20 with Dr. Carola Frost for ORIF Comminuted fracture dislocation R hip - reduced in TB, KI in place, traction pin 7/13 by Dr. Jena Gauss.  Will need closed management with traction - timing per Ortho Trauma Complex lacerations RUE/RLE with degloving - operative washout and repair 7/13 by Dr. Jena Gauss, vac to RLE, proximity to R hip makes fixation high risk. OR for washout 7/18 with Dr. Carola Frost. WOC c/s for vac changes MWF R comminuted first metacarpal fracture/open - irrigated and splinted by Dr. 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 I have reviewed this patient's available data, including medical history, events of note, physical examination and test results as part of my evaluation.
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
## 2021-08-23 ENCOUNTER — Inpatient Hospital Stay: Payer: Self-pay

## 2021-08-23 DIAGNOSIS — L8990 Pressure ulcer of unspecified site, unspecified stage: Secondary | ICD-10-CM | POA: Insufficient documentation

## 2021-08-23 DIAGNOSIS — R451 Restlessness and agitation: Secondary | ICD-10-CM

## 2021-08-23 LAB — CBC
HCT: 31.8 % — ABNORMAL LOW (ref 39.0–52.0)
Hemoglobin: 9.7 g/dL — ABNORMAL LOW (ref 13.0–17.0)
MCH: 30 pg (ref 26.0–34.0)
MCHC: 30.5 g/dL (ref 30.0–36.0)
MCV: 98.5 fL (ref 80.0–100.0)
Platelets: 490 10*3/uL — ABNORMAL HIGH (ref 150–400)
RBC: 3.23 MIL/uL — ABNORMAL LOW (ref 4.22–5.81)
RDW: 17 % — ABNORMAL HIGH (ref 11.5–15.5)
WBC: 10 10*3/uL (ref 4.0–10.5)
nRBC: 0 % (ref 0.0–0.2)

## 2021-08-23 LAB — CULTURE, BLOOD (ROUTINE X 2)
Culture: NO GROWTH
Culture: NO GROWTH
Special Requests: ADEQUATE
Special Requests: ADEQUATE

## 2021-08-23 LAB — GLUCOSE, CAPILLARY
Glucose-Capillary: 109 mg/dL — ABNORMAL HIGH (ref 70–99)
Glucose-Capillary: 95 mg/dL (ref 70–99)
Glucose-Capillary: 107 mg/dL — ABNORMAL HIGH (ref 70–99)
Glucose-Capillary: 107 mg/dL — ABNORMAL HIGH (ref 70–99)
Glucose-Capillary: 123 mg/dL — ABNORMAL HIGH (ref 70–99)

## 2021-08-23 LAB — BASIC METABOLIC PANEL
Anion gap: 8 (ref 5–15)
BUN: 63 mg/dL — ABNORMAL HIGH (ref 6–20)
CO2: 25 mmol/L (ref 22–32)
Calcium: 8.9 mg/dL (ref 8.9–10.3)
Chloride: 108 mmol/L (ref 98–111)
Creatinine, Ser: 1.19 mg/dL (ref 0.61–1.24)
GFR, Estimated: >60 mL/min (ref >60)
Glucose, Bld: 79 mg/dL (ref 70–99)
Potassium: 3.2 mmol/L — ABNORMAL LOW (ref 3.5–5.1)
Sodium: 141 mmol/L (ref 135–145)

## 2021-08-23 MED ORDER — PANTOPRAZOLE 2 MG/ML SUSPENSION
40.0000 mg | Freq: Two times a day (BID) | ORAL | Status: DC
  Administered 2021-08-23 – 2021-09-01 (×17): 40 mg
  Filled 2021-08-23 (×17): qty 20

## 2021-08-23 MED ORDER — ACETAMINOPHEN 500 MG PO TABS: 1000.0000 mg | ORAL_TABLET | Freq: Four times a day (QID) | ORAL | Status: DC

## 2021-08-23 MED ORDER — POTASSIUM CHLORIDE 20 MEQ PO PACK
40.0000 meq | PACK | Freq: Two times a day (BID) | ORAL | Status: AC
  Administered 2021-08-23 (×2): 40 meq
  Filled 2021-08-23 (×2): qty 2

## 2021-08-23 MED ORDER — ACETAMINOPHEN 500 MG PO TABS
1000.0000 mg | ORAL_TABLET | Freq: Four times a day (QID) | ORAL | Status: DC
  Administered 2021-08-23 – 2021-09-05 (×48): 1000 mg
  Filled 2021-08-23 (×53): qty 2

## 2021-08-23 NOTE — Progress Notes (Signed)
Inpatient Rehab Admissions Coordinator Note:   Per therapy patient was screened for CIR candidacy by Jesslyn Viglione Luvenia Starch, CCC-SLP. At this time, pt is not medically ready for CIR. Pt is on the ventilator. I will not pursue a rehab consult at this time. CIR admissions team will follow to monitor for medical readiness and place consult order if pt appears to be an appropriate candidate.   [name], MS, CCC-SLP Admissions Coordinator [phone] 08/23/21 4:34 PM
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
## 2021-08-23 NOTE — Progress Notes (Signed)
Follow up - Trauma and Critical Care  Patient Details:    Jeff [name] is an 21 y.o. male.  Anti-infectives:  Anti-infectives (From admission, onward)    Start     Dose/Rate Route Frequency Ordered Stop   08/22/21 0015  minocycline (MINOCIN) capsule 200 mg       Note to Pharmacy: Discussed change from 100 mg capsules to 50 mg capsules and q12h frequency with Celedonio Miyamoto who ok'd the change.   200 mg Per Tube 2 times daily 08/21/21 2329     08/21/21 2200  sulfamethoxazole-trimethoprim (BACTRIM DS) 800-160 MG per tablet 2 tablet  Status:  Discontinued        2 tablet Per Tube Every 12 hours 08/21/21 1205 08/21/21 1325   08/21/21 2200  minocycline (MINOCIN) capsule 200 mg  Status:  Discontinued       Note to Pharmacy: Discussed change from 100 mg capsules to 50 mg capsules and q12h frequency with Celedonio Miyamoto who ok'd the change.   200 mg Oral 2 times daily 08/21/21 1449 08/21/21 2329   08/21/21 1415  sulfamethoxazole-trimethoprim (BACTRIM DS) 800-160 MG per tablet 2 tablet        2 tablet Per Tube Every 12 hours 08/21/21 1325     08/21/21 1200  fluconazole (DIFLUCAN) IVPB 400 mg       See Hyperspace for full Linked Orders Report.   400 mg 100 mL/hr over 120 Minutes Intravenous Every 24 hours 08/20/21 1543     08/21/21 1000  fluconazole (DIFLUCAN) IVPB 400 mg       See Hyperspace for full Linked Orders Report.   400 mg 100 mL/hr over 120 Minutes Intravenous Every 24 hours 08/20/21 1543     08/21/21 1000  sulfamethoxazole-trimethoprim (BACTRIM DS) 800-160 MG per tablet 1 tablet  Status:  Discontinued        1 tablet Per Tube Every 12 hours 08/21/21 0850 08/21/21 1205   08/20/21 0900  vancomycin (VANCOCIN) IVPB 1000 mg/200 mL premix  Status:  Discontinued        1,000 mg 200 mL/hr over 60 Minutes Intravenous Every 12 hours 08/19/21 2050 08/21/21 1449   08/19/21 2130  vancomycin (VANCOREADY) IVPB 1500 mg/300 mL        1,500 mg 150 mL/hr over 120 Minutes Intravenous STAT  08/19/21 2041 08/20/21 0100   08/19/21 2045  metroNIDAZOLE (FLAGYL) IVPB 500 mg  Status:  Discontinued        500 mg 100 mL/hr over 60 Minutes Intravenous Every 12 hours 08/19/21 2040 08/21/21 1449   08/19/21 0900  fluconazole (DIFLUCAN) IVPB 800 mg  Status:  Discontinued        800 mg 100 mL/hr over 240 Minutes Intravenous Every 24 hours 08/19/21 0809 08/20/21 1540   08/18/21 1115  ceFEPIme (MAXIPIME) 2 g in sodium chloride 0.9 % 100 mL IVPB  Status:  Discontinued        2 g 200 mL/hr over 30 Minutes Intravenous Every 8 hours 08/18/21 1017 08/21/21 0847   08/13/21 2000  vancomycin (VANCOREADY) IVPB 1500 mg/300 mL  Status:  Discontinued        1,500 mg 150 mL/hr over 120 Minutes Intravenous Every 12 hours 08/13/21 1242 08/15/21 0952   08/10/21 1000  vancomycin (VANCOREADY) IVPB 1500 mg/300 mL  Status:  Discontinued        1,500 mg 150 mL/hr over 120 Minutes Intravenous Every 24 hours 08/09/21 0820 08/13/21 1242   08/09/21 0915  vancomycin (VANCOREADY) IVPB 2000  mg/400 mL        2,000 mg 200 mL/hr over 120 Minutes Intravenous  Once 08/09/21 0820 08/09/21 1050   08/09/21 0830  vancomycin (VANCOREADY) IVPB 1750 mg/350 mL  Status:  Discontinued        1,750 mg 175 mL/hr over 120 Minutes Intravenous  Once 08/09/21 0733 08/09/21 0820   08/07/21 1105  tobramycin (NEBCIN) powder  Status:  Discontinued          As needed 08/07/21 1106 08/07/21 1224   08/07/21 1104  vancomycin (VANCOCIN) powder  Status:  Discontinued          As needed 08/07/21 1105 08/07/21 1224   08/06/21 1000  vancomycin (VANCOREADY) IVPB 750 mg/150 mL  Status:  Discontinued        750 mg 150 mL/hr over 60 Minutes Intravenous Every 12 hours 08/05/21 2229 08/05/21 2313   08/06/21 0600  meropenem (MERREM) 1 g in sodium chloride 0.9 % 100 mL IVPB  Status:  Discontinued        1 g 200 mL/hr over 30 Minutes Intravenous Every 8 hours 08/05/21 2352 08/17/21 1043   08/06/21 0015  vancomycin (VANCOREADY) IVPB 750 mg/150 mL         750 mg 150 mL/hr over 60 Minutes Intravenous  Once 08/05/21 2313 08/06/21 0208   08/05/21 2317  vancomycin variable dose per unstable renal function (pharmacist dosing)  Status:  Discontinued         Does not apply See admin instructions 08/05/21 2317 08/06/21 0917   08/05/21 2215  Ampicillin-Sulbactam (UNASYN) 3 g in sodium chloride 0.9 % 100 mL IVPB  Status:  Discontinued        3 g 200 mL/hr over 30 Minutes Intravenous Every 6 hours 08/05/21 2209 08/05/21 2351   08/05/21 2215  vancomycin (VANCOCIN) IVPB 1000 mg/200 mL premix        1,000 mg 200 mL/hr over 60 Minutes Intravenous  Once 08/05/21 2213 08/05/21 2217   08/05/21 1815  ceFAZolin (ANCEF) IVPB 2g/100 mL premix        2 g 200 mL/hr over 30 Minutes Intravenous NOW 08/05/21 1801 08/05/21 1936       Consults: Treatment Team:  Roby Lofts, MD Myrene Galas, MD   Chief Complaint/Subjective:    Overnight Issues: Concern for blood clot around trach overnight  Objective:  Vital signs for last 24 hours: Temp:  [98.4 F (36.9 C)-100.9 F (38.3 C)] 98.4 F (36.9 C) (07/29 0400) Pulse Rate:  [102-145] 115 (07/29 0806) Resp:  [14-38] 15 (07/29 0600) BP: (134-138)/(69-72) 138/72 (07/29 0806) SpO2:  [88 %-99 %] 99 % (07/29 0806) Arterial Line BP: (104-172)/(53-92) 128/61 (07/29 0600) FiO2 (%):  [40 %] 40 % (07/29 0831)  Hemodynamic parameters for last 24 hours:    Intake/Output from previous day: 07/28 0701 - 07/29 0700 In: 1919.8 [I.V.:219.7; NG/GT:1500; IV Piggyback:200.1] Out: 3411 [Urine:1851; Stool:1400; Chest Tube:160]  Intake/Output this shift: No intake/output data recorded.  Vent settings for last 24 hours: Vent Mode: PRVC FiO2 (%):  [40 %] 40 % Set Rate:  [22 bmp-40 bmp] 22 bmp Vt Set:  [540 mL] 540 mL PEEP:  [5 cmH20] 5 cmH20 Pressure Support:  [10 cmH20] 10 cmH20 Plateau Pressure:  [27 cmH20] 27 cmH20  Physical Exam:  Gen: no distress HEENT: PERRL, trach in place, old blood clot removed from  superior portion of wound without issue Resp: assisted, unlabored Cardiovascular: RRR Abdomen: soft, NT Ext: no edema Neuro: non-focal, eyes open spon.  Assessment/Plan:   26M [facility]   SDH, IVH, SAH - NSGY c/s, Dr. Maurice Small, repeat CT head 7/12 some IVH but fairly stable, head CT 7/16 with edema, but improvement in IVH. No midline shift. L rib fx 1-8 with L HPTX - L CT to water seal, remains due to high output Acute hypoxic ventilator dependent respiratory failure - 8/5 weaning now, HTC as able Fracture dislocation L elbow/olecranon and ulnar styloid with comminuted fracture of the left midshaft and distal humerus - initial eval by Dr. Linna Caprice, splinted, definitive care per Dr. Derinda Sis Trauma, OR 7/13 with Dr. Jena Gauss I&D, CR L humerus. OR 7/20 with Dr. Carola Frost for ORIF Comminuted fracture dislocation R hip - reduced in TB, KI in place, traction pin 7/13 by Dr. Jena Gauss.  Will need closed management with traction - timing per Ortho Trauma Complex lacerations RUE/RLE with degloving - operative washout and repair 7/13 by Dr. Jena Gauss, vac to RLE, proximity to R hip makes fixation high risk. OR for washout 7/18 with Dr. Carola Frost. WOC c/s for vac changes MWF R comminuted first metacarpal fracture/open - irrigated and splinted by Dr. 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  I have reviewed this patient's available data, including medical history, events of note, physical examination and test results as  part of my evaluation.

## 2021-08-23 NOTE — Progress Notes (Signed)
PICC order received.  Spoke with [employer]. Pt with BUE surgical sites and wounds/ fractures.  Secure chat sent to Dr Sheliah Hatch re chart assessment.  Recommend placing PICC centrally in IR to avoid extremities.  Dr Sheliah Hatch replied with agreement in plan.  Notified PICC nurse would assess physically Sunday AM to confirm.  Pt currently with CVC access.

## 2021-08-23 NOTE — Progress Notes (Addendum)
Trauma Event Note   Agitation has improved after additional meds, however patient quickly goes from resting with HR in the 100s to agitated and wiggling with HR in the 140s. Respiratory at bedside to perform trach care, noted a large blood blot at tracheostomy site and thrombi pad present - unsure how long thrombi pad as been there. Clot is so large that trach is not sitting flush with the skin, 1 cm out. RT not comfortable with removing clot. Stable on vent at this time, will notify trauma MD in AM and allow them to address at AM rounds.   Last imported Vital Signs BP 134/69   Pulse (!) 109   Temp 98.8 F (37.1 C) (Oral)   Resp (!) 22   Ht 5\' 8"  (1.727 m)   Wt 171 lb 4.8 oz (77.7 kg)   SpO2 95%   BMI 26.05 kg/m   Trending CBC Recent Labs    08/20/21 1937 08/21/21 0402 08/21/21 0412 08/22/21 0714  WBC 7.9 8.4  --  11.9*  HGB 10.0* 10.2* 10.5* 10.4*  HCT 33.0* 34.5* 31.0* 34.0*  PLT 338 395  --  493*    Trending Coag's No results for input(s): "APTT", "INR" in the last 72 hours.  Trending BMET Recent Labs    08/20/21 1937 08/21/21 0402 08/21/21 0412 08/22/21 0714  NA 153* 150* 151* 142  K 4.0 3.6 3.7 4.0  CL 113* 113*  --  108  CO2 31 30  --  26  BUN 69* 61*  --  45*  CREATININE 0.97 1.01  --  1.07  GLUCOSE 181* 149*  --  137*      Jeff [name] O Jeff [name]  Trauma Response RN  Please call TRN at [phone] for further assistance.
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
## 2021-08-24 ENCOUNTER — Inpatient Hospital Stay (HOSPITAL_COMMUNITY): Payer: Medicaid Other

## 2021-08-24 LAB — BASIC METABOLIC PANEL
Anion gap: 4 — ABNORMAL LOW (ref 5–15)
BUN: 59 mg/dL — ABNORMAL HIGH (ref 6–20)
CO2: 22 mmol/L (ref 22–32)
Calcium: 8.7 mg/dL — ABNORMAL LOW (ref 8.9–10.3)
Chloride: 116 mmol/L — ABNORMAL HIGH (ref 98–111)
Creatinine, Ser: 1.08 mg/dL (ref 0.61–1.24)
GFR, Estimated: >60 mL/min (ref >60)
Glucose, Bld: 88 mg/dL (ref 70–99)
Potassium: 4 mmol/L (ref 3.5–5.1)
Sodium: 142 mmol/L (ref 135–145)

## 2021-08-24 LAB — CBC WITH DIFFERENTIAL/PLATELET
Abs Immature Granulocytes: 0.2 10*3/uL — ABNORMAL HIGH (ref 0.00–0.07)
Basophils Absolute: 0.1 10*3/uL (ref 0.0–0.1)
Basophils Relative: 1 %
Eosinophils Absolute: 1 10*3/uL — ABNORMAL HIGH (ref 0.0–0.5)
Eosinophils Relative: 11 %
HCT: 29.6 % — ABNORMAL LOW (ref 39.0–52.0)
Hemoglobin: 8.8 g/dL — ABNORMAL LOW (ref 13.0–17.0)
Immature Granulocytes: 2 %
Lymphocytes Relative: 27 %
Lymphs Abs: 2.3 10*3/uL (ref 0.7–4.0)
MCH: 29.8 pg (ref 26.0–34.0)
MCHC: 29.7 g/dL — ABNORMAL LOW (ref 30.0–36.0)
MCV: 100.3 fL — ABNORMAL HIGH (ref 80.0–100.0)
Monocytes Absolute: 0.6 10*3/uL (ref 0.1–1.0)
Monocytes Relative: 7 %
Neutro Abs: 4.6 10*3/uL (ref 1.7–7.7)
Neutrophils Relative %: 52 %
Platelets: 506 10*3/uL — ABNORMAL HIGH (ref 150–400)
RBC: 2.95 MIL/uL — ABNORMAL LOW (ref 4.22–5.81)
RDW: 16.8 % — ABNORMAL HIGH (ref 11.5–15.5)
WBC: 8.8 10*3/uL (ref 4.0–10.5)
nRBC: 0 % (ref 0.0–0.2)

## 2021-08-24 LAB — PHOSPHORUS: Phosphorus: 4.4 mg/dL (ref 2.5–4.6)

## 2021-08-24 LAB — GLUCOSE, CAPILLARY
Glucose-Capillary: 101 mg/dL — ABNORMAL HIGH (ref 70–99)
Glucose-Capillary: 105 mg/dL — ABNORMAL HIGH (ref 70–99)
Glucose-Capillary: 113 mg/dL — ABNORMAL HIGH (ref 70–99)
Glucose-Capillary: 114 mg/dL — ABNORMAL HIGH (ref 70–99)
Glucose-Capillary: 120 mg/dL — ABNORMAL HIGH (ref 70–99)
Glucose-Capillary: 136 mg/dL — ABNORMAL HIGH (ref 70–99)
Glucose-Capillary: 99 mg/dL (ref 70–99)

## 2021-08-24 LAB — MAGNESIUM: Magnesium: 2.4 mg/dL (ref 1.7–2.4)

## 2021-08-24 MED ORDER — INSULIN DETEMIR 100 UNIT/ML ~~LOC~~ SOLN
20.0000 [IU] | Freq: Two times a day (BID) | SUBCUTANEOUS | Status: DC
  Administered 2021-08-24 – 2021-08-26 (×5): 20 [IU] via SUBCUTANEOUS
  Filled 2021-08-24 (×7): qty 0.2

## 2021-08-24 NOTE — Progress Notes (Signed)
Trauma Event Note   TRN to bedside to round. Patient had a good day requiring fewer PRNs for pain management, agitation, however pain control has become an issue for patient again this shift. Has been requiring dilaudid pushes every two hours and fentanyl given with brief relief before patient begins to twitch and thrash again with HR up to the 140s. Muscle spasms in right leg intermittently. Would benefit from reorder of dilaudid infusion.   Last imported Vital Signs BP 131/67   Pulse (!) 105   Temp 98.8 F (37.1 C) (Oral)   Resp 19   Ht 5\' 8"  (1.727 m)   Wt 171 lb 4.8 oz (77.7 kg)   SpO2 95%   BMI 26.05 kg/m   Trending CBC Recent Labs    08/21/21 0402 08/21/21 0412 08/22/21 0714 08/23/21 0904  WBC 8.4  --  11.9* 10.0  HGB 10.2* 10.5* 10.4* 9.7*  HCT 34.5* 31.0* 34.0* 31.8*  PLT 395  --  493* 490*    Trending Coag's No results for input(s): "APTT", "INR" in the last 72 hours.  Trending BMET Recent Labs    08/21/21 0402 08/21/21 0412 08/22/21 0714 08/23/21 0904  NA 150* 151* 142 141  K 3.6 3.7 4.0 3.2*  CL 113*  --  108 108  CO2 30  --  26 25  BUN 61*  --  45* 63*  CREATININE 1.01  --  1.07 1.19  GLUCOSE 149*  --  137* 79      Jeff [name]  Trauma Response RN  Please call TRN at [phone] for further assistance.

## 2021-08-24 NOTE — Progress Notes (Signed)
10 Days Post-Op   Subjective/Chief Complaint: Intermittently agitated on vent   Objective: Vital signs in last 24 hours: Temp:  [98.4 F (36.9 C)-101.9 F (38.8 C)] 100.5 F (38.1 C) (07/30 0822) Pulse Rate:  [104-141] 124 (07/30 1006) Resp:  [11-28] 21 (07/30 0822) BP: (125-131)/(60-67) 126/60 (07/30 1006) SpO2:  [91 %-99 %] 95 % (07/30 0826) Arterial Line BP: (105-150)/(53-74) 116/55 (07/30 0800) FiO2 (%):  [40 %] 40 % (07/30 0823) Last BM Date : 08/23/21  Intake/Output from previous day: 07/29 0701 - 07/30 0700 In: 3200 [I.V.:200; NG/GT:2600; IV Piggyback:400.1] Out: 3920 [Urine:1350; Stool:2550; Chest Tube:20] Intake/Output this shift: Total I/O In: 255 [I.V.:30; NG/GT:225] Out: -   General appearance: arousable and agitated. Not following commands Resp: clear to auscultation bilaterally and on vent Cardio: tachy GI: soft, nontender  Lab Results:  Recent Labs    08/23/21 0904 08/24/21 0846  WBC 10.0 8.8  HGB 9.7* 8.8*  HCT 31.8* 29.6*  PLT 490* 506*   BMET Recent Labs    08/23/21 0904 08/24/21 0846  NA 141 142  K 3.2* 4.0  CL 108 116*  CO2 25 22  GLUCOSE 79 88  BUN 63* 59*  CREATININE 1.19 1.08  CALCIUM 8.9 8.7*   PT/INR No results for input(s): "LABPROT", "INR" in the last 72 hours. ABG No results for input(s): "PHART", "HCO3" in the last 72 hours.  Invalid input(s): "PCO2", "PO2"  Studies/Results: DG Chest Port 1 View  Result Date: 08/24/2021 CLINICAL DATA:  Respiratory failure. EXAM: PORTABLE CHEST 1 VIEW COMPARISON:  08/22/2021 FINDINGS: Tracheostomy tube, small bore feeding tube with tip off the field of view and LEFT thoracostomy tube again noted. A small LEFT apicolateral pneumothorax is visualized (less than 10%). Diffuse bilateral airspace opacities and multiple LEFT rib fractures are again noted. IMPRESSION: Small LEFT apicolateral pneumothorax (less than 10%). LEFT thoracostomy tube remains in place. Unchanged diffuse bilateral  airspace opacities. Electronically Signed   By: Harmon Pier M.D.   On: 08/24/2021 08:50   [place] EKG SITE RITE  Result Date: 08/23/2021 If Site Rite image not attached, placement could not be confirmed due to current cardiac rhythm.  DG Chest Port 1 View  Result Date: 08/22/2021 CLINICAL DATA:  Respiratory failure EXAM: PORTABLE CHEST 1 VIEW COMPARISON:  08/20/2021 FINDINGS: Tracheostomy tube is positioned over the tracheal air shadow. Feeding tube enters the stomach. Left chest tube remains in place. No pneumothorax observed. Displaced left rib fractures are again noted. Low lung volumes. Hazy opacities are present throughout both lungs with slightly more confluent airspace opacity at the left lung base and right upper lobe which could be from atelectasis or pneumonia. Probably chronic left scapular deformity. IMPRESSION: 1. Mild worsening of airspace opacities at the left lung base and in the right upper lobe. Background hazy opacities in both lungs, cannot exclude underlying edema. 2. Left chest tube remains in place. No pneumothorax. Multiple displaced left rib fractures. 3. Chronic deformity left scapula. Electronically Signed   By: Gaylyn Rong M.D.   On: 08/22/2021 16:55    Anti-infectives: Anti-infectives (From admission, onward)    Start     Dose/Rate Route Frequency Ordered Stop   08/22/21 0015  minocycline (MINOCIN) capsule 200 mg       Note to Pharmacy: Discussed change from 100 mg capsules to 50 mg capsules and q12h frequency with Celedonio Miyamoto who ok'd the change.   200 mg Per Tube 2 times daily 08/21/21 2329     08/21/21 2200  sulfamethoxazole-trimethoprim (  BACTRIM DS) 800-160 MG per tablet 2 tablet  Status:  Discontinued        2 tablet Per Tube Every 12 hours 08/21/21 1205 08/21/21 1325   08/21/21 2200  minocycline (MINOCIN) capsule 200 mg  Status:  Discontinued       Note to Pharmacy: Discussed change from 100 mg capsules to 50 mg capsules and q12h frequency with Celedonio Miyamoto  who ok'd the change.   200 mg Oral 2 times daily 08/21/21 1449 08/21/21 2329   08/21/21 1415  sulfamethoxazole-trimethoprim (BACTRIM DS) 800-160 MG per tablet 2 tablet        2 tablet Per Tube Every 12 hours 08/21/21 1325     08/21/21 1200  fluconazole (DIFLUCAN) IVPB 400 mg       See Hyperspace for full Linked Orders Report.   400 mg 100 mL/hr over 120 Minutes Intravenous Every 24 hours 08/20/21 1543     08/21/21 1000  fluconazole (DIFLUCAN) IVPB 400 mg       See Hyperspace for full Linked Orders Report.   400 mg 100 mL/hr over 120 Minutes Intravenous Every 24 hours 08/20/21 1543     08/21/21 1000  sulfamethoxazole-trimethoprim (BACTRIM DS) 800-160 MG per tablet 1 tablet  Status:  Discontinued        1 tablet Per Tube Every 12 hours 08/21/21 0850 08/21/21 1205   08/20/21 0900  vancomycin (VANCOCIN) IVPB 1000 mg/200 mL premix  Status:  Discontinued        1,000 mg 200 mL/hr over 60 Minutes Intravenous Every 12 hours 08/19/21 2050 08/21/21 1449   08/19/21 2130  vancomycin (VANCOREADY) IVPB 1500 mg/300 mL        1,500 mg 150 mL/hr over 120 Minutes Intravenous STAT 08/19/21 2041 08/20/21 0100   08/19/21 2045  metroNIDAZOLE (FLAGYL) IVPB 500 mg  Status:  Discontinued        500 mg 100 mL/hr over 60 Minutes Intravenous Every 12 hours 08/19/21 2040 08/21/21 1449   08/19/21 0900  fluconazole (DIFLUCAN) IVPB 800 mg  Status:  Discontinued        800 mg 100 mL/hr over 240 Minutes Intravenous Every 24 hours 08/19/21 0809 08/20/21 1540   08/18/21 1115  ceFEPIme (MAXIPIME) 2 g in sodium chloride 0.9 % 100 mL IVPB  Status:  Discontinued        2 g 200 mL/hr over 30 Minutes Intravenous Every 8 hours 08/18/21 1017 08/21/21 0847   08/13/21 2000  vancomycin (VANCOREADY) IVPB 1500 mg/300 mL  Status:  Discontinued        1,500 mg 150 mL/hr over 120 Minutes Intravenous Every 12 hours 08/13/21 1242 08/15/21 0952   08/10/21 1000  vancomycin (VANCOREADY) IVPB 1500 mg/300 mL  Status:  Discontinued         1,500 mg 150 mL/hr over 120 Minutes Intravenous Every 24 hours 08/09/21 0820 08/13/21 1242   08/09/21 0915  vancomycin (VANCOREADY) IVPB 2000 mg/400 mL        2,000 mg 200 mL/hr over 120 Minutes Intravenous  Once 08/09/21 0820 08/09/21 1050   08/09/21 0830  vancomycin (VANCOREADY) IVPB 1750 mg/350 mL  Status:  Discontinued        1,750 mg 175 mL/hr over 120 Minutes Intravenous  Once 08/09/21 0733 08/09/21 0820   08/07/21 1105  tobramycin (NEBCIN) powder  Status:  Discontinued          As needed 08/07/21 1106 08/07/21 1224   08/07/21 1104  vancomycin (VANCOCIN) powder  Status:  Discontinued  As needed 08/07/21 1105 08/07/21 1224   08/06/21 1000  vancomycin (VANCOREADY) IVPB 750 mg/150 mL  Status:  Discontinued        750 mg 150 mL/hr over 60 Minutes Intravenous Every 12 hours 08/05/21 2229 08/05/21 2313   08/06/21 0600  meropenem (MERREM) 1 g in sodium chloride 0.9 % 100 mL IVPB  Status:  Discontinued        1 g 200 mL/hr over 30 Minutes Intravenous Every 8 hours 08/05/21 2352 08/17/21 1043   08/06/21 0015  vancomycin (VANCOREADY) IVPB 750 mg/150 mL        750 mg 150 mL/hr over 60 Minutes Intravenous  Once 08/05/21 2313 08/06/21 0208   08/05/21 2317  vancomycin variable dose per unstable renal function (pharmacist dosing)  Status:  Discontinued         Does not apply See admin instructions 08/05/21 2317 08/06/21 0917   08/05/21 2215  Ampicillin-Sulbactam (UNASYN) 3 g in sodium chloride 0.9 % 100 mL IVPB  Status:  Discontinued        3 g 200 mL/hr over 30 Minutes Intravenous Every 6 hours 08/05/21 2209 08/05/21 2351   08/05/21 2215  vancomycin (VANCOCIN) IVPB 1000 mg/200 mL premix        1,000 mg 200 mL/hr over 60 Minutes Intravenous  Once 08/05/21 2213 08/05/21 2217   08/05/21 1815  ceFAZolin (ANCEF) IVPB 2g/100 mL premix        2 g 200 mL/hr over 30 Minutes Intravenous NOW 08/05/21 1801 08/05/21 1936       Assessment/Plan: s/p Procedure(s): OPEN REDUCTION INTERNAL  FIXATION (ORIF) DISTAL HUMERUS FRACTURE (Left) OPEN REDUCTION INTERNAL FIXATION (ORIF) ELBOW/OLECRANON FRACTURE (Left) VDRF 82M [facility]   SDH, IVH, SAH - NSGY c/s, Dr. Maurice Small, repeat CT head 7/12 some IVH but fairly stable, head CT 7/16 with edema, but improvement in IVH. No midline shift. L rib fx 1-8 with L HPTX - L CT to water seal, remains due to high output Acute hypoxic ventilator dependent respiratory failure - 8/5 weaning now, HTC as able Fracture dislocation L elbow/olecranon and ulnar styloid with comminuted fracture of the left midshaft and distal humerus - initial eval by Dr. Linna Caprice, splinted, definitive care per Dr. Derinda Sis Trauma, OR 7/13 with Dr. Jena Gauss I&D, CR L humerus. OR 7/20 with Dr. Carola Frost for ORIF Comminuted fracture dislocation R hip - reduced in TB, KI in place, traction pin 7/13 by Dr. Jena Gauss.  Will need closed management with traction - timing per Ortho Trauma Complex lacerations RUE/RLE with degloving - operative washout and repair 7/13 by Dr. Jena Gauss, vac to RLE, proximity to R hip makes fixation high risk. OR for washout 7/18 with Dr. Carola Frost. WOC c/s for vac changes MWF R comminuted first metacarpal fracture/open - irrigated and splinted by Dr. 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

## 2021-08-24 NOTE — Progress Notes (Signed)
Discussed wound vac status with ortho PA. New wound vac machine obtained, currently functioning. RN instructed to connect wound vac site to wall suction again if wound vac machine fails overnight and ortho team will come assess patient tomorrow.

## 2021-08-24 NOTE — Progress Notes (Signed)
New wound vac machine reading "blockage alert" with same alarm as previous machine. All previous interventions reattempted with no improvement. Pt hooked back up to wall suction. See previous progress note for documentation of stated ortho follow up.

## 2021-08-24 NOTE — Progress Notes (Signed)
At bedside to assess BUE.  Continue with recommendation for IR to place central PICC line due to extensive injuries bilaterally.  [facility] at bedside aware.

## 2021-08-24 NOTE — Progress Notes (Signed)
0730: Dicussed nonfunctioning wound vac with ortho provider on call. Dressing last changed 7/26. Machine repeatedly beeping occluded despite canister and suction tubing being changed and patient being repositioned. Per provider ortho services will come to assess the patient today. Wound vac site connected to wall suction at 125 mmHG per previously written order.   0900: RN notified of new small left apical pneumothorax. Dr. Sheliah Hatch notified. Chest tube connected to suction at 20. Discussed need for chest x-ray with Dr. Carolynne Edouard during his rounds, repeat chest x-ray tomorrow morning.

## 2021-08-25 ENCOUNTER — Inpatient Hospital Stay (HOSPITAL_COMMUNITY): Payer: Medicaid Other

## 2021-08-25 ENCOUNTER — Inpatient Hospital Stay: Payer: Self-pay

## 2021-08-25 ENCOUNTER — Encounter (HOSPITAL_COMMUNITY): Payer: Self-pay | Admitting: Internal Medicine

## 2021-08-25 LAB — GLUCOSE, CAPILLARY
Glucose-Capillary: 118 mg/dL — ABNORMAL HIGH (ref 70–99)
Glucose-Capillary: 118 mg/dL — ABNORMAL HIGH (ref 70–99)
Glucose-Capillary: 120 mg/dL — ABNORMAL HIGH (ref 70–99)
Glucose-Capillary: 130 mg/dL — ABNORMAL HIGH (ref 70–99)
Glucose-Capillary: 83 mg/dL (ref 70–99)
Glucose-Capillary: 98 mg/dL (ref 70–99)

## 2021-08-25 LAB — CBC
HCT: 30.4 % — ABNORMAL LOW (ref 39.0–52.0)
Hemoglobin: 9.5 g/dL — ABNORMAL LOW (ref 13.0–17.0)
MCH: 30.5 pg (ref 26.0–34.0)
MCHC: 31.3 g/dL (ref 30.0–36.0)
MCV: 97.7 fL (ref 80.0–100.0)
Platelets: 593 10*3/uL — ABNORMAL HIGH (ref 150–400)
RBC: 3.11 MIL/uL — ABNORMAL LOW (ref 4.22–5.81)
RDW: 16.6 % — ABNORMAL HIGH (ref 11.5–15.5)
WBC: 12.4 10*3/uL — ABNORMAL HIGH (ref 4.0–10.5)
nRBC: 0 % (ref 0.0–0.2)

## 2021-08-25 LAB — BASIC METABOLIC PANEL
Anion gap: 5 (ref 5–15)
BUN: 38 mg/dL — ABNORMAL HIGH (ref 6–20)
CO2: 22 mmol/L (ref 22–32)
Calcium: 8.7 mg/dL — ABNORMAL LOW (ref 8.9–10.3)
Chloride: 114 mmol/L — ABNORMAL HIGH (ref 98–111)
Creatinine, Ser: 0.72 mg/dL (ref 0.61–1.24)
GFR, Estimated: >60 mL/min (ref >60)
Glucose, Bld: 117 mg/dL — ABNORMAL HIGH (ref 70–99)
Potassium: 4.7 mmol/L (ref 3.5–5.1)
Sodium: 141 mmol/L (ref 135–145)

## 2021-08-25 LAB — CK: Total CK: 476 U/L — ABNORMAL HIGH (ref 49–397)

## 2021-08-25 MED ORDER — ORAL CARE MOUTH RINSE
15.0000 mL | OROMUCOSAL | Status: DC
  Administered 2021-08-25 – 2021-09-06 (×139): 15 mL via OROMUCOSAL

## 2021-08-25 MED ORDER — ORAL CARE MOUTH RINSE: 15.0000 mL | OROMUCOSAL | Status: DC | PRN

## 2021-08-25 MED ORDER — SODIUM CHLORIDE 0.9% FLUSH: 10.0000 mL | INTRAVENOUS | Status: DC | PRN

## 2021-08-25 MED ORDER — CARBAMAZEPINE 200 MG PO TABS
300.0000 mg | ORAL_TABLET | Freq: Two times a day (BID) | ORAL | Status: DC
  Administered 2021-08-25 – 2021-08-29 (×8): 300 mg
  Filled 2021-08-25 (×9): qty 1.5

## 2021-08-25 MED ORDER — SODIUM CHLORIDE 0.9% FLUSH
10.0000 mL | Freq: Two times a day (BID) | INTRAVENOUS | Status: DC
  Administered 2021-08-25: 10 mL
  Administered 2021-08-25: 30 mL
  Administered 2021-08-26 – 2021-08-31 (×11): 10 mL
  Administered 2021-09-01 – 2021-09-02 (×4): 30 mL
  Administered 2021-09-03 – 2021-09-05 (×4): 10 mL
  Administered 2021-09-06: 20 mL
  Administered 2021-09-06 – 2021-09-09 (×7): 10 mL
  Administered 2021-09-10 (×2): 30 mL
  Administered 2021-09-11: 10 mL
  Administered 2021-09-12: 20 mL
  Administered 2021-09-12 – 2021-09-19 (×8): 10 mL

## 2021-08-25 MED ORDER — FUROSEMIDE 10 MG/ML IJ SOLN
40.0000 mg | Freq: Once | INTRAMUSCULAR | Status: AC
  Administered 2021-08-25: 40 mg via INTRAVENOUS
  Filled 2021-08-25: qty 4

## 2021-08-25 MED ORDER — PIVOT 1.5 CAL PO LIQD
1000.0000 mL | ORAL | Status: DC
  Administered 2021-08-25 – 2021-08-26 (×3): 1000 mL
  Filled 2021-08-25: qty 1000

## 2021-08-25 MED ORDER — FREE WATER
200.0000 mL | Status: DC
  Administered 2021-08-25 – 2021-08-26 (×10): 200 mL
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
## 2021-08-25 NOTE — Progress Notes (Signed)
SLP Cancellation Note  Patient Details Name: Loron We[place] MRN: [id number] DOB: [birth year]   Cancelled treatment:       Reason Eval/Treat Not Completed: Patient's level of consciousness. Pt just given dilaudid, not making eye contact. Talked a bit with mother and sister about PMSV trials. Will attempt tomorrow and check in with RN in am for a good time to work with pt.    Amario Longmore, Riley Nearing 08/25/2021, 3:25 PM

## 2021-08-25 NOTE — Progress Notes (Signed)
Pt placed back  on full vent support due to increased WOB and desat to 84. Pt tolerating well SpO2 92%, RN at bedside, RT will monitor.
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
## 2021-08-25 NOTE — Progress Notes (Signed)
Pt placed on trach collar 10L 40% MD at bedside. Pt tolerating well at this time, RN at bedside, RT will monitor.

## 2021-08-25 NOTE — Progress Notes (Signed)
Trauma/Critical Care Follow Up Note  Subjective:    Overnight Issues:   Objective:  Vital signs for last 24 hours: Temp:  [98.4 F (36.9 C)-101.6 F (38.7 C)] 98.4 F (36.9 C) (07/31 1143) Pulse Rate:  [111-142] 141 (07/31 1200) Resp:  [12-35] 34 (07/31 1200) BP: (126-133)/(64-67) 126/64 (07/31 1200) SpO2:  [88 %-97 %] 88 % (07/31 1200) Arterial Line BP: (110-150)/(52-78) 150/71 (07/31 1200) FiO2 (%):  [40 %-60 %] 60 % (07/31 1143)  Hemodynamic parameters for last 24 hours:    Intake/Output from previous day: 07/30 0701 - 07/31 0700 In: 5800 [I.V.:199.9; NG/GT:5200; IV Piggyback:400.1] Out: 4855 [Urine:3250; Drains:75; Stool:800; Chest Tube:730]  Intake/Output this shift: Total I/O In: 120 [NG/GT:120] Out: -   Vent settings for last 24 hours: Vent Mode: PRVC FiO2 (%):  [40 %-60 %] 60 % Set Rate:  [22 bmp] 22 bmp Vt Set:  [540 mL] 540 mL PEEP:  [5 cmH20] 5 cmH20 Plateau Pressure:  [18 cmH20-21 cmH20] 20 cmH20  Physical Exam:  Gen: comfortable, no distress Neuro: non-focal exam HEENT: PERRL Neck: supple CV: RRR Pulm: unlabored breathing Abd: soft, NT GU: clear yellow urine Extr: wwp, no edema   Results for orders placed or performed during the hospital encounter of 08/05/21 (from the past 24 hour(s))  Glucose, capillary     Status: Abnormal   Collection Time: 08/24/21  4:00 PM  Result Value Ref Range   Glucose-Capillary 120 (H) 70 - 99 mg/dL  Glucose, capillary     Status: Abnormal   Collection Time: 08/24/21  8:13 PM  Result Value Ref Range   Glucose-Capillary 105 (H) 70 - 99 mg/dL  Glucose, capillary     Status: None   Collection Time: 08/24/21 11:43 PM  Result Value Ref Range   Glucose-Capillary 99 70 - 99 mg/dL  Glucose, capillary     Status: Abnormal   Collection Time: 08/25/21  3:33 AM  Result Value Ref Range   Glucose-Capillary 118 (H) 70 - 99 mg/dL  CK     Status: Abnormal   Collection Time: 08/25/21  5:45 AM  Result Value Ref Range    Total CK 476 (H) 49 - 397 U/L  Glucose, capillary     Status: Abnormal   Collection Time: 08/25/21  8:06 AM  Result Value Ref Range   Glucose-Capillary 118 (H) 70 - 99 mg/dL   Comment 1 Notify RN    Comment 2 Document in Chart   Glucose, capillary     Status: Abnormal   Collection Time: 08/25/21 11:35 AM  Result Value Ref Range   Glucose-Capillary 130 (H) 70 - 99 mg/dL   Comment 1 Notify RN    Comment 2 Document in Chart   CBC     Status: Abnormal   Collection Time: 08/25/21 11:36 AM  Result Value Ref Range   WBC 12.4 (H) 4.0 - 10.5 K/uL   RBC 3.11 (L) 4.22 - 5.81 MIL/uL   Hemoglobin 9.5 (L) 13.0 - 17.0 g/dL   HCT 21.3 (L) 08.6 - 57.8 %   MCV 97.7 80.0 - 100.0 fL   MCH 30.5 26.0 - 34.0 pg   MCHC 31.3 30.0 - 36.0 g/dL   RDW 46.9 (H) 62.9 - 52.8 %   Platelets 593 (H) 150 - 400 K/uL   nRBC 0.0 0.0 - 0.2 %    Assessment & Plan: The plan of care was discussed with the bedside nurse for the day, who is in agreement with this plan and  no additional concerns were raised.   Present on Admission:  Trauma of chest  ARDS (adult respiratory distress syndrome) ([name])  Contusion of left lung  Acute on chronic respiratory failure with hypoxia and hypercapnia ([name])  Critical polytrauma  TBI (traumatic brain injury) ([name])    LOS: 20 days   Additional comments:I reviewed the patient's new clinical lab test results.   and I reviewed the patients new imaging test results.    40M [facility]   SDH, IVH, SAH - NSGY c/s, Dr. Maurice Small, repeat CT head 7/12 some IVH but fairly stable, head CT 7/16 with edema, but improvement in IVH. No midline shift. L rib fx 1-8 with L HPTX - L CT to water seal, remains due to high output Acute hypoxic ventilator dependent respiratory failure - 8/5 weaning now, HTC as able Fracture dislocation L elbow/olecranon and ulnar styloid with comminuted fracture of the left midshaft and distal humerus - initial eval by Dr. Linna Caprice, splinted, definitive care per Dr.  Derinda Sis Trauma, OR 7/13 with Dr. Jena Gauss I&D, CR L humerus. OR 7/20 with Dr. Carola Frost for ORIF Comminuted fracture dislocation R hip - reduced in TB, KI in place, traction pin 7/13 by Dr. Jena Gauss.  Will need closed management with traction - timing per Ortho Trauma Complex lacerations RUE/RLE with degloving - operative washout and repair 7/13 by Dr. Jena Gauss, vac to RLE, proximity to R hip makes fixation high risk. OR for washout 7/18 with Dr. Carola Frost. WOC c/s for vac changes MWF R comminuted first metacarpal fracture/open - irrigated and splinted by Dr. 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 I have reviewed this patient's available data, including medical history, events of note, physical examination and test results as part of my evaluation.

## 2021-08-25 NOTE — Consult Note (Signed)
WOC to be re-consulted if needed for NPWT dressings. Ortho/trauma is managing VAC dressings at this time.   Will sign off  Markee Remlinger [facility], CNS, [employer] [phone]
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
## 2021-08-26 LAB — GLUCOSE, CAPILLARY
Glucose-Capillary: 110 mg/dL — ABNORMAL HIGH (ref 70–99)
Glucose-Capillary: 94 mg/dL (ref 70–99)
Glucose-Capillary: 94 mg/dL (ref 70–99)
Glucose-Capillary: 127 mg/dL — ABNORMAL HIGH (ref 70–99)
Glucose-Capillary: 109 mg/dL — ABNORMAL HIGH (ref 70–99)
Glucose-Capillary: 110 mg/dL — ABNORMAL HIGH (ref 70–99)

## 2021-08-26 LAB — BASIC METABOLIC PANEL
Anion gap: 5 (ref 5–15)
BUN: 34 mg/dL — ABNORMAL HIGH (ref 6–20)
CO2: 24 mmol/L (ref 22–32)
Calcium: 8.6 mg/dL — ABNORMAL LOW (ref 8.9–10.3)
Chloride: 110 mmol/L (ref 98–111)
Creatinine, Ser: 0.71 mg/dL (ref 0.61–1.24)
GFR, Estimated: >60 mL/min (ref >60)
Glucose, Bld: 124 mg/dL — ABNORMAL HIGH (ref 70–99)
Potassium: 3.6 mmol/L (ref 3.5–5.1)
Sodium: 139 mmol/L (ref 135–145)

## 2021-08-26 LAB — CBC
HCT: 29.8 % — ABNORMAL LOW (ref 39.0–52.0)
Hemoglobin: 9.2 g/dL — ABNORMAL LOW (ref 13.0–17.0)
MCH: 30.1 pg (ref 26.0–34.0)
MCHC: 30.9 g/dL (ref 30.0–36.0)
MCV: 97.4 fL (ref 80.0–100.0)
Platelets: 502 10*3/uL — ABNORMAL HIGH (ref 150–400)
RBC: 3.06 MIL/uL — ABNORMAL LOW (ref 4.22–5.81)
RDW: 16.6 % — ABNORMAL HIGH (ref 11.5–15.5)
WBC: 10.2 10*3/uL (ref 4.0–10.5)
nRBC: 0 % (ref 0.0–0.2)

## 2021-08-26 MED ORDER — FREE WATER
200.0000 mL | Status: DC
  Administered 2021-08-26 – 2021-08-27 (×11): 200 mL

## 2021-08-26 MED ORDER — INSULIN ASPART 100 UNIT/ML IJ SOLN
6.0000 [IU] | INTRAMUSCULAR | Status: DC
  Administered 2021-08-26 – 2021-08-28 (×13): 6 [IU] via SUBCUTANEOUS
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
## 2021-08-26 NOTE — Progress Notes (Signed)
OT Cancellation Note  Patient Details Name: Harutyun Monteverde MRN: [id number] DOB: 2000/10/25   Cancelled Treatment:    Reason Eval/Treat Not Completed: Other (comment) (Per discussion with RN - pt with increased agitation, RR in 40s and HR 130s; will cancel and f/u tomorrow.)  Lelon Mast A Leshea Jaggers 08/26/2021, 5:38 PM

## 2021-08-26 NOTE — Progress Notes (Signed)
Pt placed on 10L 60% trach col. By RT. Pt tolerating well at this time, RN aware, MD aware, RT will monitor.    08/26/21 0815  Respiratory Assessment  Assessment Type Assess only  Respiratory Pattern Regular;Unlabored  Chest Assessment Chest expansion symmetrical  Cough Productive  Sputum Amount Moderate  Sputum Color White  Sputum Consistency Thick  Sputum Specimen Source Tracheostomy tube  Bilateral Breath Sounds Diminished;Rhonchi  Oxygen Therapy/Pulse Ox  O2 Device (S)  Tracheostomy Collar  O2 Therapy Oxygen humidified  O2 Flow Rate (L/min) 10 L/min  FiO2 (%) 50 %  SpO2 92 %  Tracheostomy Shiley Flexible 8 mm Cuffed  Placement Date/Time: 08/12/21 1100   Placed By: ICU physician  Brand: Shiley Flexible  Size (mm): 8 mm  Style: Cuffed  Status Secured  Site Assessment Clean;Dry  Site Care Cleansed;Dried;Dressing applied  Inner Cannula Care Changed/new  Ties Assessment Clean, Dry, Secure  Cuff Pressure (cm H2O) (S)   (deflated)  Tracheostomy Equipment at bedside Yes and checklist posted at head of bed
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
## 2021-08-26 NOTE — Progress Notes (Addendum)
Trauma/Critical Care Follow Up Note  Subjective:    Overnight Issues:   Objective:  Vital signs for last 24 hours: Temp:  [98.1 F (36.7 C)-102.2 F (39 C)] 98.1 F (36.7 C) (08/01 0800) Pulse Rate:  [107-144] 107 (08/01 0700) Resp:  [15-34] 21 (08/01 0700) BP: (105-145)/(43-88) 127/63 (08/01 0700) SpO2:  [88 %-100 %] 92 % (08/01 0815) Arterial Line BP: (118-150)/(54-78) 134/64 (07/31 1500) FiO2 (%):  [40 %-60 %] 50 % (08/01 0815) Weight:  [83.6 kg] 83.6 kg (08/01 0500)  Hemodynamic parameters for last 24 hours:    Intake/Output from previous day: 07/31 0701 - 08/01 0700 In: 5016.2 [I.V.:51.2; HY/IF:0277.4; IV Piggyback:400.1] Out: 5470 [Urine:4175; Drains:150; Stool:945; Chest Tube:200]  Intake/Output this shift: No intake/output data recorded.  Vent settings for last 24 hours: Vent Mode: PRVC FiO2 (%):  [40 %-60 %] 50 % Set Rate:  [22 bmp] 22 bmp Vt Set:  [540 mL] 540 mL PEEP:  [5 cmH20] 5 cmH20 Plateau Pressure:  [18 cmH20-21 cmH20] 18 cmH20  Physical Exam:  Gen: comfortable, no distress Neuro: non-focal exam HEENT: PERRL Neck: supple CV: RRR Pulm: unlabored breathing Abd: soft, NT GU: clear yellow urine Extr: wwp, no edema   Results for orders placed or performed during the hospital encounter of 08/05/21 (from the past 24 hour(s))  Glucose, capillary     Status: Abnormal   Collection Time: 08/25/21 11:35 AM  Result Value Ref Range   Glucose-Capillary 130 (H) 70 - 99 mg/dL   Comment 1 Notify RN    Comment 2 Document in Chart   CBC     Status: Abnormal   Collection Time: 08/25/21 11:36 AM  Result Value Ref Range   WBC 12.4 (H) 4.0 - 10.5 K/uL   RBC 3.11 (L) 4.22 - 5.81 MIL/uL   Hemoglobin 9.5 (L) 13.0 - 17.0 g/dL   HCT 12.8 (L) 78.6 - 76.7 %   MCV 97.7 80.0 - 100.0 fL   MCH 30.5 26.0 - 34.0 pg   MCHC 31.3 30.0 - 36.0 g/dL   RDW 20.9 (H) 47.0 - 96.2 %   Platelets 593 (H) 150 - 400 K/uL   nRBC 0.0 0.0 - 0.2 %  Basic metabolic panel     Status:  Abnormal   Collection Time: 08/25/21 11:36 AM  Result Value Ref Range   Sodium 141 135 - 145 mmol/L   Potassium 4.7 3.5 - 5.1 mmol/L   Chloride 114 (H) 98 - 111 mmol/L   CO2 22 22 - 32 mmol/L   Glucose, Bld 117 (H) 70 - 99 mg/dL   BUN 38 (H) 6 - 20 mg/dL   Creatinine, Ser 8.36 0.61 - 1.24 mg/dL   Calcium 8.7 (L) 8.9 - 10.3 mg/dL   GFR, Estimated >62 >94 mL/min   Anion gap 5 5 - 15  Glucose, capillary     Status: None   Collection Time: 08/25/21  3:35 PM  Result Value Ref Range   Glucose-Capillary 83 70 - 99 mg/dL   Comment 1 Notify RN    Comment 2 Document in Chart   Glucose, capillary     Status: Abnormal   Collection Time: 08/25/21  7:44 PM  Result Value Ref Range   Glucose-Capillary 120 (H) 70 - 99 mg/dL  Glucose, capillary     Status: None   Collection Time: 08/25/21 11:36 PM  Result Value Ref Range   Glucose-Capillary 98 70 - 99 mg/dL  Glucose, capillary     Status: Abnormal  Collection Time: 08/26/21  3:39 AM  Result Value Ref Range   Glucose-Capillary 110 (H) 70 - 99 mg/dL  CBC     Status: Abnormal   Collection Time: 08/26/21  5:44 AM  Result Value Ref Range   WBC 10.2 4.0 - 10.5 K/uL   RBC 3.06 (L) 4.22 - 5.81 MIL/uL   Hemoglobin 9.2 (L) 13.0 - 17.0 g/dL   HCT 78.9 (L) 38.1 - 01.7 %   MCV 97.4 80.0 - 100.0 fL   MCH 30.1 26.0 - 34.0 pg   MCHC 30.9 30.0 - 36.0 g/dL   RDW 51.0 (H) 25.8 - 52.7 %   Platelets 502 (H) 150 - 400 K/uL   nRBC 0.0 0.0 - 0.2 %  Basic metabolic panel     Status: Abnormal   Collection Time: 08/26/21  5:44 AM  Result Value Ref Range   Sodium 139 135 - 145 mmol/L   Potassium 3.6 3.5 - 5.1 mmol/L   Chloride 110 98 - 111 mmol/L   CO2 24 22 - 32 mmol/L   Glucose, Bld 124 (H) 70 - 99 mg/dL   BUN 34 (H) 6 - 20 mg/dL   Creatinine, Ser 7.82 0.61 - 1.24 mg/dL   Calcium 8.6 (L) 8.9 - 10.3 mg/dL   GFR, Estimated >42 >35 mL/min   Anion gap 5 5 - 15  Glucose, capillary     Status: None   Collection Time: 08/26/21  7:57 AM  Result Value Ref  Range   Glucose-Capillary 94 70 - 99 mg/dL   Comment 1 Notify RN    Comment 2 Document in Chart     Assessment & Plan: The plan of care was discussed with the bedside nurse for the day, who is in agreement with this plan and no additional concerns were raised.   Present on Admission:  Trauma of chest  ARDS (adult respiratory distress syndrome) ([name])  Contusion of left lung  Acute on chronic respiratory failure with hypoxia and hypercapnia ([name])  Critical polytrauma  TBI (traumatic brain injury) ([name])    LOS: 21 days   Additional comments:I reviewed the patient's new clinical lab test results.   and I reviewed the patients new imaging test results.    46M [facility]   SDH, IVH, SAH - NSGY c/s, Dr. Maurice Small, repeat CT head 7/12 some IVH but fairly stable, head CT 7/16 with edema, but improvement in IVH. No midline shift. L rib fx 1-8 with L HPTX - L CT to water seal, remains due to high output Acute hypoxic ventilator dependent respiratory failure - 8/5 weaning now, HTC as able Fracture dislocation L elbow/olecranon and ulnar styloid with comminuted fracture of the left midshaft and distal humerus - initial eval by Dr. Linna Caprice, splinted, definitive care per Dr. Derinda Sis Trauma, OR 7/13 with Dr. Jena Gauss I&D, CR L humerus. OR 7/20 with Dr. Carola Frost for ORIF Comminuted fracture dislocation R hip - reduced in TB, KI in place, traction pin 7/13 by Dr. Jena Gauss.  Plan to remove traction mid-August and then possibly NWB vs TDWB on that side.  Complex lacerations RUE/RLE with degloving - operative washout and repair 7/13 by Dr. Jena Gauss, vac to RLE, proximity to R hip makes fixation high risk. OR for washout 7/18 with Dr. Carola Frost. Vac change by ortho 8/3 R comminuted first metacarpal fracture/open - irrigated and splinted by Dr. 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 I have reviewed this patient's available data, including medical history, events of note, physical examination and test results as part of my evaluation.

## 2021-08-27 ENCOUNTER — Inpatient Hospital Stay (HOSPITAL_COMMUNITY): Payer: Medicaid Other

## 2021-08-27 LAB — BASIC METABOLIC PANEL
Anion gap: 4 — ABNORMAL LOW (ref 5–15)
BUN: 30 mg/dL — ABNORMAL HIGH (ref 6–20)
CO2: 23 mmol/L (ref 22–32)
Calcium: 8 mg/dL — ABNORMAL LOW (ref 8.9–10.3)
Chloride: 109 mmol/L (ref 98–111)
Creatinine, Ser: 0.58 mg/dL — ABNORMAL LOW (ref 0.61–1.24)
GFR, Estimated: >60 mL/min (ref >60)
Glucose, Bld: 106 mg/dL — ABNORMAL HIGH (ref 70–99)
Potassium: 3.7 mmol/L (ref 3.5–5.1)
Sodium: 136 mmol/L (ref 135–145)

## 2021-08-27 LAB — POCT I-STAT 7, (LYTES, BLD GAS, ICA,H+H)
Acid-base deficit: 1 mmol/L (ref 0.0–2.0)
Bicarbonate: 24.9 mmol/L (ref 20.0–28.0)
Calcium, Ion: 1.26 mmol/L (ref 1.15–1.40)
HCT: 30 % — ABNORMAL LOW (ref 39.0–52.0)
Hemoglobin: 10.2 g/dL — ABNORMAL LOW (ref 13.0–17.0)
O2 Saturation: 94 %
Patient temperature: 101
Potassium: 4.6 mmol/L (ref 3.5–5.1)
Sodium: 139 mmol/L (ref 135–145)
TCO2: 26 mmol/L (ref 22–32)
pCO2 arterial: 46 mmHg (ref 32–48)
pH, Arterial: 7.348 — ABNORMAL LOW (ref 7.35–7.45)
pO2, Arterial: 82 mmHg — ABNORMAL LOW (ref 83–108)

## 2021-08-27 LAB — CBC
HCT: 30.4 % — ABNORMAL LOW (ref 39.0–52.0)
Hemoglobin: 9.5 g/dL — ABNORMAL LOW (ref 13.0–17.0)
MCH: 30.3 pg (ref 26.0–34.0)
MCHC: 31.3 g/dL (ref 30.0–36.0)
MCV: 96.8 fL (ref 80.0–100.0)
Platelets: 490 10*3/uL — ABNORMAL HIGH (ref 150–400)
RBC: 3.14 MIL/uL — ABNORMAL LOW (ref 4.22–5.81)
RDW: 16.4 % — ABNORMAL HIGH (ref 11.5–15.5)
WBC: 12.2 10*3/uL — ABNORMAL HIGH (ref 4.0–10.5)
nRBC: 0 % (ref 0.0–0.2)

## 2021-08-27 LAB — GLUCOSE, CAPILLARY
Glucose-Capillary: 104 mg/dL — ABNORMAL HIGH (ref 70–99)
Glucose-Capillary: 119 mg/dL — ABNORMAL HIGH (ref 70–99)
Glucose-Capillary: 149 mg/dL — ABNORMAL HIGH (ref 70–99)
Glucose-Capillary: 110 mg/dL — ABNORMAL HIGH (ref 70–99)
Glucose-Capillary: 115 mg/dL — ABNORMAL HIGH (ref 70–99)
Glucose-Capillary: 109 mg/dL — ABNORMAL HIGH (ref 70–99)

## 2021-08-27 MED ORDER — DEXMEDETOMIDINE HCL IN NACL 400 MCG/100ML IV SOLN
INTRAVENOUS | Status: AC
  Administered 2021-08-27: 0.5 ug/kg/h
  Filled 2021-08-27: qty 100

## 2021-08-27 MED ORDER — LEVALBUTEROL HCL 0.63 MG/3ML IN NEBU
INHALATION_SOLUTION | RESPIRATORY_TRACT | Status: AC
  Administered 2021-08-27: 0.63 mg
  Filled 2021-08-27: qty 3

## 2021-08-27 MED ORDER — METOPROLOL TARTRATE 5 MG/5ML IV SOLN
INTRAVENOUS | Status: AC
  Administered 2021-08-27: 5 mg
  Filled 2021-08-27: qty 5

## 2021-08-27 MED ORDER — IOHEXOL 350 MG/ML SOLN
85.0000 mL | Freq: Once | INTRAVENOUS | Status: AC | PRN
Start: 2021-08-27 — End: 2021-08-27
  Administered 2021-08-27: 85 mL via INTRAVENOUS

## 2021-08-27 MED ORDER — FREE WATER
200.0000 mL | Freq: Four times a day (QID) | Status: DC
  Administered 2021-08-27 – 2021-09-03 (×28): 200 mL

## 2021-08-27 MED ORDER — INSULIN DETEMIR 100 UNIT/ML ~~LOC~~ SOLN
15.0000 [IU] | Freq: Two times a day (BID) | SUBCUTANEOUS | Status: DC
Start: 2021-08-27 — End: 2021-09-14
  Administered 2021-08-27 – 2021-09-14 (×35): 15 [IU] via SUBCUTANEOUS
  Filled 2021-08-27 (×40): qty 0.15

## 2021-08-27 MED ORDER — PIVOT 1.5 CAL PO LIQD
1000.0000 mL | ORAL | Status: DC
Start: 2021-08-27 — End: 2021-09-02
  Administered 2021-08-27 – 2021-09-02 (×7): 1000 mL
  Filled 2021-08-27 (×3): qty 1000

## 2021-08-27 MED ORDER — DEXMEDETOMIDINE HCL IN NACL 400 MCG/100ML IV SOLN
0.0000 ug/kg/h | INTRAVENOUS | Status: DC
  Administered 2021-08-27: 0.7 ug/kg/h via INTRAVENOUS
  Administered 2021-08-27: 0.5 ug/kg/h via INTRAVENOUS
  Administered 2021-08-28: 0.6 ug/kg/h via INTRAVENOUS
  Administered 2021-08-28: 0.9 ug/kg/h via INTRAVENOUS
  Administered 2021-08-28: 0.6 ug/kg/h via INTRAVENOUS
  Administered 2021-08-29: 0.9 ug/kg/h via INTRAVENOUS
  Administered 2021-08-29: 0.8 ug/kg/h via INTRAVENOUS
  Administered 2021-08-29 – 2021-08-30 (×3): 1.1 ug/kg/h via INTRAVENOUS
  Administered 2021-08-30 (×2): 1.2 ug/kg/h via INTRAVENOUS
  Administered 2021-08-30 (×2): 1 ug/kg/h via INTRAVENOUS
  Administered 2021-08-31: 1.2 ug/kg/h via INTRAVENOUS
  Administered 2021-08-31: 1 ug/kg/h via INTRAVENOUS
  Administered 2021-08-31: 1.1 ug/kg/h via INTRAVENOUS
  Administered 2021-08-31: 1 ug/kg/h via INTRAVENOUS
  Administered 2021-08-31 (×2): 1.1 ug/kg/h via INTRAVENOUS
  Administered 2021-09-01: 1 ug/kg/h via INTRAVENOUS
  Administered 2021-09-01: 0.7 ug/kg/h via INTRAVENOUS
  Administered 2021-09-01 (×4): 1 ug/kg/h via INTRAVENOUS
  Administered 2021-09-02 (×3): 0.7 ug/kg/h via INTRAVENOUS
  Administered 2021-09-03: 0.8 ug/kg/h via INTRAVENOUS
  Administered 2021-09-03: 0.6 ug/kg/h via INTRAVENOUS
  Administered 2021-09-03 – 2021-09-04 (×2): 0.5 ug/kg/h via INTRAVENOUS
  Administered 2021-09-04: 0.8 ug/kg/h via INTRAVENOUS
  Administered 2021-09-04 – 2021-09-05 (×2): 0.7 ug/kg/h via INTRAVENOUS
  Administered 2021-09-05: 1 ug/kg/h via INTRAVENOUS
  Filled 2021-08-27: qty 200
  Filled 2021-08-27 (×27): qty 100
  Filled 2021-08-27: qty 200
  Filled 2021-08-27 (×5): qty 100

## 2021-08-27 MED ORDER — FUROSEMIDE 10 MG/ML IJ SOLN
40.0000 mg | Freq: Once | INTRAMUSCULAR | Status: AC
  Administered 2021-08-27: 40 mg via INTRAVENOUS
  Filled 2021-08-27: qty 4

## 2021-08-27 NOTE — Progress Notes (Signed)
Trauma Event Note  Event Summary: Rounded on patient-- upon rounding, patient resting comfortably in bed, HR back down to 110-115, respiratory rate 16. Patient appears more comfortable after receiving sedation medications. Will continue to monitor.  Last imported Vital Signs BP 114/75 (BP Location: Left Leg)   Pulse (!) 130   Temp (!) 101.5 F (38.6 C) (Oral)   Resp (!) 38   Ht 5\' 8"  (1.727 m)   Wt 83.6 kg   SpO2 95%   BMI 28.02 kg/m   Trending CBC Recent Labs    08/25/21 1136 08/26/21 0544 08/27/21 0436 08/27/21 1532  WBC 12.4* 10.2 12.2*  --   HGB 9.5* 9.2* 9.5* 10.2*  HCT 30.4* 29.8* 30.4* 30.0*  PLT 593* 502* 490*  --     Trending Coag's No results for input(s): "APTT", "INR" in the last 72 hours.  Trending BMET Recent Labs    08/25/21 1136 08/26/21 0544 08/27/21 0436 08/27/21 1532  NA 141 139 136 139  K 4.7 3.6 3.7 4.6  CL 114* 110 109  --   CO2 22 24 23   --   BUN 38* 34* 30*  --   CREATININE 0.72 0.71 0.58*  --   GLUCOSE 117* 124* 106*  --       10/27/21  Trauma Response RN  Please call TRN at [phone] for further assistance.

## 2021-08-27 NOTE — Progress Notes (Signed)
Trauma/Critical Care Follow Up Note  Subjective:    Overnight Issues:   Objective:  Vital signs for last 24 hours: Temp:  [98.5 F (36.9 C)-102 F (38.9 C)] 98.5 F (36.9 C) (08/02 0800) Pulse Rate:  [113-145] 116 (08/02 0700) Resp:  [11-56] 19 (08/02 0700) BP: (102-147)/(51-129) 118/67 (08/02 0700) SpO2:  [89 %-94 %] 91 % (08/02 0754) FiO2 (%):  [60 %-80 %] 60 % (08/02 0754)  Hemodynamic parameters for last 24 hours:    Intake/Output from previous day: 08/01 0701 - 08/02 0700 In: 4280 [NG/GT:3880; IV Piggyback:400] Out: 4360 [Urine:3075; Drains:250; Stool:825; Chest Tube:210]  Intake/Output this shift: No intake/output data recorded.  Vent settings for last 24 hours: FiO2 (%):  [60 %-80 %] 60 %  Physical Exam:  Gen: comfortable, no distress Neuro: not f/c HEENT: PERRL, stable disconjugate gaze Neck: supple CV: RRR Pulm: unlabored breathing Abd: soft, NT GU: clear yellow urine Extr: wwp, no edema   Results for orders placed or performed during the hospital encounter of 08/05/21 (from the past 24 hour(s))  Glucose, capillary     Status: None   Collection Time: 08/26/21 11:57 AM  Result Value Ref Range   Glucose-Capillary 94 70 - 99 mg/dL   Comment 1 Notify RN    Comment 2 Document in Chart   Glucose, capillary     Status: Abnormal   Collection Time: 08/26/21  4:02 PM  Result Value Ref Range   Glucose-Capillary 127 (H) 70 - 99 mg/dL   Comment 1 Notify RN    Comment 2 Document in Chart   Glucose, capillary     Status: Abnormal   Collection Time: 08/26/21  7:56 PM  Result Value Ref Range   Glucose-Capillary 109 (H) 70 - 99 mg/dL  Glucose, capillary     Status: Abnormal   Collection Time: 08/26/21 11:29 PM  Result Value Ref Range   Glucose-Capillary 110 (H) 70 - 99 mg/dL  Glucose, capillary     Status: Abnormal   Collection Time: 08/27/21  3:33 AM  Result Value Ref Range   Glucose-Capillary 104 (H) 70 - 99 mg/dL  CBC     Status: Abnormal    Collection Time: 08/27/21  4:36 AM  Result Value Ref Range   WBC 12.2 (H) 4.0 - 10.5 K/uL   RBC 3.14 (L) 4.22 - 5.81 MIL/uL   Hemoglobin 9.5 (L) 13.0 - 17.0 g/dL   HCT 85.2 (L) 77.8 - 24.2 %   MCV 96.8 80.0 - 100.0 fL   MCH 30.3 26.0 - 34.0 pg   MCHC 31.3 30.0 - 36.0 g/dL   RDW 35.3 (H) 61.4 - 43.1 %   Platelets 490 (H) 150 - 400 K/uL   nRBC 0.0 0.0 - 0.2 %  Basic metabolic panel     Status: Abnormal   Collection Time: 08/27/21  4:36 AM  Result Value Ref Range   Sodium 136 135 - 145 mmol/L   Potassium 3.7 3.5 - 5.1 mmol/L   Chloride 109 98 - 111 mmol/L   CO2 23 22 - 32 mmol/L   Glucose, Bld 106 (H) 70 - 99 mg/dL   BUN 30 (H) 6 - 20 mg/dL   Creatinine, Ser 5.40 (L) 0.61 - 1.24 mg/dL   Calcium 8.0 (L) 8.9 - 10.3 mg/dL   GFR, Estimated >08 >67 mL/min   Anion gap 4 (L) 5 - 15  Glucose, capillary     Status: Abnormal   Collection Time: 08/27/21  8:03 AM  Result  Value Ref Range   Glucose-Capillary 119 (H) 70 - 99 mg/dL    Assessment & Plan: The plan of care was discussed with the bedside nurse for the day, Jeff [name], who is in agreement with this plan and no additional concerns were raised.   Present on Admission:  Trauma of chest  ARDS (adult respiratory distress syndrome) ([name])  Contusion of left lung  Acute on chronic respiratory failure with hypoxia and hypercapnia ([name])  Critical polytrauma  TBI (traumatic brain injury) ([name])    LOS: 22 days   Additional comments:I reviewed the patient's new clinical lab test results.   and I reviewed the patients new imaging test results.    41M [facility]   SDH, IVH, SAH - NSGY c/s, Dr. Maurice Small, repeat CT head 7/12 some IVH but fairly stable, head CT 7/16 with edema, but improvement in IVH. No midline shift. L rib fx 1-8 with L HPTX - L CT to water seal, remains due to high output Acute hypoxic ventilator dependent respiratory failure - 8/5 weaning now, HTC as able Fracture dislocation L elbow/olecranon and ulnar styloid with comminuted  fracture of the left midshaft and distal humerus - initial eval by Dr. Linna Caprice, splinted, definitive care per Dr. Derinda Sis Trauma, OR 7/13 with Dr. Jena Gauss I&D, CR L humerus. OR 7/20 with Dr. Carola Frost for ORIF Comminuted fracture dislocation R hip - reduced in TB, KI in place, traction pin 7/13 by Dr. Jena Gauss.  Plan to remove traction mid-August and then possibly NWB vs TDWB on that side.  Complex lacerations RUE/RLE with degloving - operative washout and repair 7/13 by Dr. Jena Gauss, vac to RLE, proximity to R hip makes fixation high risk. OR for washout 7/18 with Dr. Carola Frost. Vac change by ortho 8/3 R comminuted first metacarpal fracture/open - irrigated and splinted by Dr. 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 I have reviewed this patient's available data, including medical history, events of note, physical examination and test results as part of my evaluation.

## 2021-08-27 NOTE — Progress Notes (Signed)
Trauma Event Note   TRN checked on pt while on unit and he was tachycardic, tachypneac and diaphoretic.  Attempted to reposition him.  Spoke with bedside RN, Jeff [name] who states she had just given him several meds to alleviate pain and decrease HR but his HR consistently rising into the 160's.  Dr. Bedelia Person came to bedside at this time.  Ice packs applied, 40mg  lasix given and precedex started @ 0.100mcg/kg/hr.  Dr. 4m ordered CT angio and CT abd/pelvis at this time.  5mg  metoprolol given just prior to CT: HR now down to 120's.  Assisted bedside RN and RT to CT3 for scans.  Awaiting results.  Updated family at the bedside.  Last imported Vital Signs BP (!) 94/59   Pulse (!) 115   Temp (!) 103.1 F (39.5 C) (Oral)   Resp (!) 22   Ht 5\' 8"  (1.727 m)   Wt 184 lb 4.9 oz (83.6 kg)   SpO2 95%   BMI 28.02 kg/m   Trending CBC Recent Labs    08/25/21 1136 08/26/21 0544 08/27/21 0436 08/27/21 1532  WBC 12.4* 10.2 12.2*  --   HGB 9.5* 9.2* 9.5* 10.2*  HCT 30.4* 29.8* 30.4* 30.0*  PLT 593* 502* 490*  --     Trending Coag's No results for input(s): "APTT", "INR" in the last 72 hours.  Trending BMET Recent Labs    08/25/21 1136 08/26/21 0544 08/27/21 0436 08/27/21 1532  NA 141 139 136 139  K 4.7 3.6 3.7 4.6  CL 114* 110 109  --   CO2 22 24 23   --   BUN 38* 34* 30*  --   CREATININE 0.72 0.71 0.58*  --   GLUCOSE 117* 124* 106*  --      Jeff [name] W  Trauma Response RN  Please call TRN at [phone] for further assistance.

## 2021-08-27 NOTE — Progress Notes (Signed)
Pt transported on vent from 4N25 to CT and back without any complications. RN at bedside, RT will monitor.  

## 2021-08-27 NOTE — Progress Notes (Signed)
RT called to patient room due to patient having increased RR, increased HR, and decreased sats to 88%.  Patient was placed back on ventilator, on full support ventilator settings, and is currently tolerating well at this time.  Will continue to monitor.

## 2021-08-27 NOTE — Progress Notes (Signed)
PT Cancellation Note  Patient Details Name: Jeff [name] MRN: [id number] DOB: 2000-03-22   Cancelled Treatment:    Reason Eval/Treat Not Completed: Patient not medically ready. RN requesting hold on PT at this time as pt is demonstrating asymmetrical breathing. Will plan to follow-up later as time permits.   Raymond Gurney, PT, DPT Acute Rehabilitation Services  Office: [phone]    Jewel Baize 08/27/2021, 11:05 AM

## 2021-08-27 NOTE — Progress Notes (Signed)
OT Cancellation Note  Patient Details Name: Jeff [name] MRN: [id number] DOB: 05-15-2000   Cancelled Treatment:    Reason Eval/Treat Not Completed: Medical issues which prohibited therapy (Pt with poor respritory status and back on full vent support. OT to f/u when medically appropriate.)  Ayven Glasco A Marlisha Vanwyk 08/27/2021, 4:09 PM

## 2021-08-28 ENCOUNTER — Inpatient Hospital Stay (HOSPITAL_COMMUNITY): Payer: Medicaid Other

## 2021-08-28 DIAGNOSIS — S299XXA Unspecified injury of thorax, initial encounter: Secondary | ICD-10-CM | POA: Diagnosis not present

## 2021-08-28 DIAGNOSIS — R509 Fever, unspecified: Secondary | ICD-10-CM

## 2021-08-28 LAB — GLUCOSE, CAPILLARY
Glucose-Capillary: 117 mg/dL — ABNORMAL HIGH (ref 70–99)
Glucose-Capillary: 118 mg/dL — ABNORMAL HIGH (ref 70–99)
Glucose-Capillary: 143 mg/dL — ABNORMAL HIGH (ref 70–99)
Glucose-Capillary: 125 mg/dL — ABNORMAL HIGH (ref 70–99)
Glucose-Capillary: 106 mg/dL — ABNORMAL HIGH (ref 70–99)
Glucose-Capillary: 96 mg/dL (ref 70–99)

## 2021-08-28 LAB — BODY FLUID CELL COUNT WITH DIFFERENTIAL
Eos, Fluid: 4 %
Eos, Fluid: 15 %
Lymphs, Fluid: 7 %
Lymphs, Fluid: 4 %
Monocyte-Macrophage-Serous Fluid: 45 % — ABNORMAL LOW (ref 50–90)
Monocyte-Macrophage-Serous Fluid: 22 % — ABNORMAL LOW (ref 50–90)
Neutrophil Count, Fluid: 44 % — ABNORMAL HIGH (ref 0–25)
Neutrophil Count, Fluid: 56 % — ABNORMAL HIGH (ref 0–25)
Other Cells, Fluid: 3 %
Total Nucleated Cell Count, Fluid: 40 cu mm (ref 0–1000)
Total Nucleated Cell Count, Fluid: 81 cu mm (ref 0–1000)
Total Nucleated Cell Count, Fluid: 0 cu mm (ref 0–1000)

## 2021-08-28 LAB — CBC
HCT: 32.4 % — ABNORMAL LOW (ref 39.0–52.0)
Hemoglobin: 9.7 g/dL — ABNORMAL LOW (ref 13.0–17.0)
MCH: 29.8 pg (ref 26.0–34.0)
MCHC: 29.9 g/dL — ABNORMAL LOW (ref 30.0–36.0)
MCV: 99.4 fL (ref 80.0–100.0)
Platelets: 520 10*3/uL — ABNORMAL HIGH (ref 150–400)
RBC: 3.26 MIL/uL — ABNORMAL LOW (ref 4.22–5.81)
RDW: 16 % — ABNORMAL HIGH (ref 11.5–15.5)
WBC: 11.9 10*3/uL — ABNORMAL HIGH (ref 4.0–10.5)
nRBC: 0 % (ref 0.0–0.2)

## 2021-08-28 LAB — BASIC METABOLIC PANEL
Anion gap: 6 (ref 5–15)
BUN: 34 mg/dL — ABNORMAL HIGH (ref 6–20)
CO2: 25 mmol/L (ref 22–32)
Calcium: 8.6 mg/dL — ABNORMAL LOW (ref 8.9–10.3)
Chloride: 110 mmol/L (ref 98–111)
Creatinine, Ser: 0.66 mg/dL (ref 0.61–1.24)
GFR, Estimated: >60 mL/min (ref >60)
Glucose, Bld: 116 mg/dL — ABNORMAL HIGH (ref 70–99)
Potassium: 4.1 mmol/L (ref 3.5–5.1)
Sodium: 141 mmol/L (ref 135–145)

## 2021-08-28 LAB — PROCALCITONIN: Procalcitonin: 0.31 ng/mL

## 2021-08-28 LAB — BRAIN NATRIURETIC PEPTIDE: B Natriuretic Peptide: 11.4 pg/mL (ref 0.0–100.0)

## 2021-08-28 MED ORDER — OXYCODONE HCL 5 MG PO TABS
10.0000 mg | ORAL_TABLET | ORAL | Status: DC
  Administered 2021-08-28 – 2021-08-31 (×15): 10 mg
  Filled 2021-08-28 (×16): qty 2

## 2021-08-28 MED ORDER — MIDAZOLAM HCL 2 MG/2ML IJ SOLN: 4.0000 mg | Freq: Once | INTRAMUSCULAR | Status: AC

## 2021-08-28 MED ORDER — MIDAZOLAM HCL 2 MG/2ML IJ SOLN
INTRAMUSCULAR | Status: AC
  Administered 2021-08-28: 4 mg via INTRAVENOUS
  Filled 2021-08-28: qty 4

## 2021-08-28 MED ORDER — HEPARIN (PORCINE) 25000 UT/250ML-% IV SOLN: 1300.0000 [IU]/h | INTRAVENOUS | Status: DC

## 2021-08-28 MED ORDER — FENTANYL CITRATE PF 50 MCG/ML IJ SOSY: 100.0000 ug | PREFILLED_SYRINGE | Freq: Once | INTRAMUSCULAR | Status: AC

## 2021-08-28 MED ORDER — ENOXAPARIN SODIUM 60 MG/0.6ML IJ SOSY
50.0000 mg | PREFILLED_SYRINGE | Freq: Once | INTRAMUSCULAR | Status: AC
  Administered 2021-08-28: 50 mg via SUBCUTANEOUS
  Filled 2021-08-28: qty 0.5

## 2021-08-28 MED ORDER — OXYCODONE HCL 5 MG PO TABS: 7.5000 mg | ORAL_TABLET | ORAL | Status: DC

## 2021-08-28 MED ORDER — FENTANYL CITRATE PF 50 MCG/ML IJ SOSY
100.0000 ug | PREFILLED_SYRINGE | Freq: Once | INTRAMUSCULAR | Status: AC
  Administered 2021-08-28: 100 ug via INTRAVENOUS

## 2021-08-28 MED ORDER — DIAZEPAM 5 MG PO TABS
7.5000 mg | ORAL_TABLET | Freq: Four times a day (QID) | ORAL | Status: AC
  Administered 2021-08-28 – 2021-08-31 (×12): 7.5 mg
  Filled 2021-08-28 (×12): qty 2

## 2021-08-28 MED ORDER — OXYCODONE HCL 5 MG PO TABS: 2.5000 mg | ORAL_TABLET | Freq: Four times a day (QID) | ORAL | Status: DC

## 2021-08-28 MED ORDER — OXYCODONE HCL 5 MG PO TABS
7.5000 mg | ORAL_TABLET | ORAL | Status: DC
  Administered 2021-08-31 – 2021-09-03 (×17): 7.5 mg
  Filled 2021-08-28 (×18): qty 2

## 2021-08-28 MED ORDER — FENTANYL CITRATE PF 50 MCG/ML IJ SOSY
PREFILLED_SYRINGE | INTRAMUSCULAR | Status: AC
  Administered 2021-08-28: 100 ug via INTRAVENOUS
  Filled 2021-08-28: qty 2

## 2021-08-28 MED ORDER — DIAZEPAM 2 MG PO TABS
2.0000 mg | ORAL_TABLET | Freq: Four times a day (QID) | ORAL | Status: AC
  Administered 2021-09-03 – 2021-09-06 (×11): 2 mg
  Filled 2021-08-28 (×11): qty 1

## 2021-08-28 MED ORDER — VECURONIUM BROMIDE 10 MG IV SOLR: 10.0000 mg | Freq: Once | INTRAVENOUS | Status: AC

## 2021-08-28 MED ORDER — SODIUM CHLORIDE 0.9 % IV BOLUS
500.0000 mL | Freq: Once | INTRAVENOUS | Status: AC
  Administered 2021-08-28: 500 mL via INTRAVENOUS

## 2021-08-28 MED ORDER — ENOXAPARIN SODIUM 80 MG/0.8ML IJ SOSY
80.0000 mg | PREFILLED_SYRINGE | Freq: Two times a day (BID) | INTRAMUSCULAR | Status: DC
  Administered 2021-08-29 – 2021-08-31 (×5): 80 mg via SUBCUTANEOUS
  Filled 2021-08-28 (×5): qty 0.8

## 2021-08-28 MED ORDER — DIAZEPAM 5 MG PO TABS
5.0000 mg | ORAL_TABLET | Freq: Four times a day (QID) | ORAL | Status: AC
  Administered 2021-08-31 – 2021-09-03 (×12): 5 mg
  Filled 2021-08-28 (×12): qty 1

## 2021-08-28 MED ORDER — OXYCODONE HCL 5 MG PO TABS: 2.5000 mg | ORAL_TABLET | ORAL | Status: DC

## 2021-08-28 MED ORDER — OXYCODONE HCL 5 MG PO TABS: 5.0000 mg | ORAL_TABLET | ORAL | Status: DC

## 2021-08-28 MED ORDER — VECURONIUM BROMIDE 10 MG IV SOLR
INTRAVENOUS | Status: AC
  Administered 2021-08-28: 10 mg via INTRAVENOUS
  Filled 2021-08-28: qty 10

## 2021-08-28 MED ORDER — OXYCODONE HCL 5 MG PO TABS
5.0000 mg | ORAL_TABLET | ORAL | Status: DC
  Administered 2021-09-03 – 2021-09-06 (×16): 5 mg
  Filled 2021-08-28 (×18): qty 1

## 2021-08-28 MED ORDER — INSULIN ASPART 100 UNIT/ML IJ SOLN
4.0000 [IU] | INTRAMUSCULAR | Status: DC
  Administered 2021-08-28 – 2021-09-04 (×34): 4 [IU] via SUBCUTANEOUS

## 2021-08-28 MED ORDER — OXYCODONE HCL 5 MG PO TABS
2.5000 mg | ORAL_TABLET | ORAL | Status: DC
  Administered 2021-09-06 – 2021-09-09 (×15): 2.5 mg
  Filled 2021-08-28 (×15): qty 1

## 2021-08-28 MED ORDER — OXYCODONE HCL 5 MG/5ML PO SOLN
10.0000 mg | ORAL | Status: DC | PRN
  Administered 2021-09-03 – 2021-09-15 (×24): 10 mg
  Filled 2021-08-28 (×25): qty 10

## 2021-08-28 MED ORDER — SODIUM CHLORIDE 0.9 % IV SOLN
1.0000 g | Freq: Three times a day (TID) | INTRAVENOUS | Status: AC
  Administered 2021-08-28 – 2021-09-04 (×21): 1 g via INTRAVENOUS
  Filled 2021-08-28 (×21): qty 20

## 2021-08-28 MED ORDER — OXYCODONE HCL 5 MG PO TABS: 10.0000 mg | ORAL_TABLET | ORAL | Status: DC

## 2021-08-28 NOTE — Progress Notes (Signed)
No higher level of care indicated for transfer, however as a courtesy, I called [facility] to request transfer. Return call received at 1148 from [facility] to notify patient was declined for transfer. Family notified.   Diamantina Monks, MD General and Trauma Surgery [facility] Surgery

## 2021-08-28 NOTE — Procedures (Signed)
Bronchoscopy Procedure Note  Jeff [name]  [id number]  2000/08/22  Date:08/28/21  Time:2:46 PM   Provider Performing:Nunzio Banet   Procedure(s):  Flexible bronchoscopy with brushing (41937) and Flexible bronchoscopy with bronchial alveolar lavage (90240)  Indication(s) Respiratory failure, possible infection vs eosinophilic pneumonia.   Consent Risks of the procedure as well as the alternatives and risks of each were explained to the patient and/or caregiver.  Consent for the procedure was obtained and is signed in the bedside chart  Anesthesia Fentanyl 200, midazolam 4, vecuronium 10   Time Out Verified patient identification, verified procedure, site/side was marked, verified correct patient position, special equipment/implants available, medications/allergies/relevant history reviewed, required imaging and test results available.   Sterile Technique Usual hand hygiene, masks, gowns, and gloves were used   Procedure Description Bronchoscope advanced through tracheostomy tube and into airway.  Airways were examined down to subsegmental level with findings noted below.   Following diagnostic evaluation, BAL(s) performed in RUL, LLL, lingula with normal saline and return of 30 fluid and Brushing(s) performed in RML and superior segment RLL  Findings: normal airways with no visible secretions.    Complications/Tolerance None; patient tolerated the procedure well. Chest X-ray is needed post procedure.   EBL none   Specimen(s) Sent for bacterial and fungal culture, cell count with differential   Kipp Brood, MD [facility] ICU Physician [place]  Pager: [phone] Or Epic Secure Chat After hours: [phone].  08/28/2021, 2:52 PM

## 2021-08-28 NOTE — Progress Notes (Signed)
SLP Cancellation Note  Patient Details Name: Jeff [name] MRN: [id number] DOB: 06-22-2000   Cancelled treatment:       Reason Eval/Treat Not Completed: Patient not medically ready. Pt on ventilator   Berta Denson, Riley Nearing 08/28/2021, 9:38 AM
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
## 2021-08-28 NOTE — Progress Notes (Signed)
ANTICOAGULATION and ANTIBIOTIC CONSULT NOTE - Initial Consult  Pharmacy Consult for Lovenox and meropenem  Indication: DVT and HCAP   Allergies  Allergen Reactions   Whole Blood     Patient is Jehovah's Witness    Patient Measurements: Height: 5' 7.72" (172 cm) Weight: 84 kg (185 lb 3 oz) IBW/kg (Calculated) : 67.75 Heparin Dosing Weight: 84kg   Vital Signs: Temp: 100.5 F (38.1 C) (08/03 1145) Temp Source: Axillary (08/03 1145) BP: 99/51 (08/03 1415) Pulse Rate: 107 (08/03 1415)  Labs: Recent Labs    08/26/21 0544 08/27/21 0436 08/27/21 1532 08/28/21 0540  HGB 9.2* 9.5* 10.2* 9.7*  HCT 29.8* 30.4* 30.0* 32.4*  PLT 502* 490*  --  520*  CREATININE 0.71 0.58*  --  0.66    Estimated Creatinine Clearance: 154.6 mL/min (by C-G formula based on SCr of 0.66 mg/dL).   Medical History: No past medical history on file.   Assessment: Acute DVT found on [place]. Patient's hemoglobin and platelets are stable. No bleeding noted. Patient received morning dose of prophylaxis Lovenox.   Meropenem for suspected HCAP after bronchoscopy on 8/3. Respiratory and BAL cultures collected. CT showed possible abscesses.   Goal of Therapy:  Anti-Xa level 0.6-1.0  Monitor platelets by anticoagulation protocol: Yes   Plan:  Will give 1x 43m dose now as the patient received 384mdose of Lovenox at 12:15.  Then start 8036movenox every 12 hours.  Obtain anti-Xa peak at steady state.  Continue to monitor H&H while on Lovenox.   Start meropenem 1G Q8H for suspected HCAP.   HeaVentura Sellers3/2023,2:45 PM

## 2021-08-28 NOTE — Progress Notes (Signed)
Patient breath stacking and appearing air hungry at this time. Placed patient on CPAP/PS 12/ PEEP 5. Patient more comfortable on this mode. Agarwala, MD notifed of changes and states that vent changes are acceptable. RN aware. Will continue to monitor.

## 2021-08-28 NOTE — Progress Notes (Signed)
Bilateral lower extremity and bilateral upper extremity venous duplex completed. Refer to "CV Proc" under chart review to view preliminary results.  08/28/2021 11:53 AM Eula Fried., MHA, RVT, RDCS, RDMS
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
## 2021-08-29 ENCOUNTER — Inpatient Hospital Stay (HOSPITAL_COMMUNITY): Payer: Medicaid Other

## 2021-08-29 DIAGNOSIS — S299XXA Unspecified injury of thorax, initial encounter: Secondary | ICD-10-CM | POA: Diagnosis not present

## 2021-08-29 DIAGNOSIS — G9341 Metabolic encephalopathy: Secondary | ICD-10-CM

## 2021-08-29 LAB — GLUCOSE, CAPILLARY
Glucose-Capillary: 104 mg/dL — ABNORMAL HIGH (ref 70–99)
Glucose-Capillary: 127 mg/dL — ABNORMAL HIGH (ref 70–99)
Glucose-Capillary: 92 mg/dL (ref 70–99)
Glucose-Capillary: 182 mg/dL — ABNORMAL HIGH (ref 70–99)
Glucose-Capillary: 137 mg/dL — ABNORMAL HIGH (ref 70–99)

## 2021-08-29 LAB — BASIC METABOLIC PANEL
Anion gap: 10 (ref 5–15)
BUN: 27 mg/dL — ABNORMAL HIGH (ref 6–20)
CO2: 25 mmol/L (ref 22–32)
Calcium: 8.7 mg/dL — ABNORMAL LOW (ref 8.9–10.3)
Chloride: 107 mmol/L (ref 98–111)
Creatinine, Ser: 0.58 mg/dL — ABNORMAL LOW (ref 0.61–1.24)
GFR, Estimated: >60 mL/min (ref >60)
Glucose, Bld: 133 mg/dL — ABNORMAL HIGH (ref 70–99)
Potassium: 4.1 mmol/L (ref 3.5–5.1)
Sodium: 142 mmol/L (ref 135–145)

## 2021-08-29 LAB — CBC
HCT: 27.8 % — ABNORMAL LOW (ref 39.0–52.0)
Hemoglobin: 8.5 g/dL — ABNORMAL LOW (ref 13.0–17.0)
MCH: 30 pg (ref 26.0–34.0)
MCHC: 30.6 g/dL (ref 30.0–36.0)
MCV: 98.2 fL (ref 80.0–100.0)
Platelets: 395 10*3/uL (ref 150–400)
RBC: 2.83 MIL/uL — ABNORMAL LOW (ref 4.22–5.81)
RDW: 16 % — ABNORMAL HIGH (ref 11.5–15.5)
WBC: 12 10*3/uL — ABNORMAL HIGH (ref 4.0–10.5)
nRBC: 0 % (ref 0.0–0.2)

## 2021-08-29 LAB — PROCALCITONIN: Procalcitonin: 0.47 ng/mL

## 2021-08-29 LAB — CMV ANTIBODY, IGG (EIA): CMV Ab - IgG: <0.6 U/mL (ref 0.00–0.59)

## 2021-08-29 MED ORDER — METHYLPREDNISOLONE SODIUM SUCC 125 MG IJ SOLR
80.0000 mg | Freq: Every day | INTRAMUSCULAR | Status: AC
Start: 2021-08-29 — End: 2021-08-31
  Administered 2021-08-29 – 2021-08-31 (×3): 80 mg via INTRAVENOUS
  Filled 2021-08-29 (×3): qty 2

## 2021-08-29 MED ORDER — FLUCONAZOLE IN SODIUM CHLORIDE 400-0.9 MG/200ML-% IV SOLN
400.0000 mg | INTRAVENOUS | Status: AC
  Administered 2021-08-29 – 2021-09-03 (×6): 400 mg via INTRAVENOUS
  Filled 2021-08-29 (×6): qty 200

## 2021-08-29 MED ORDER — PANCRELIPASE (LIP-PROT-AMYL) 10440-39150 UNITS PO TABS
20880.0000 [IU] | ORAL_TABLET | Freq: Once | ORAL | Status: DC
  Filled 2021-08-29: qty 2

## 2021-08-29 MED ORDER — METHYLPREDNISOLONE SODIUM SUCC 40 MG IJ SOLR
40.0000 mg | Freq: Every day | INTRAMUSCULAR | Status: DC
  Administered 2021-09-01 – 2021-09-02 (×2): 40 mg via INTRAVENOUS
  Filled 2021-08-29 (×2): qty 1

## 2021-08-29 MED ORDER — CARBAMAZEPINE 200 MG PO TABS
200.0000 mg | ORAL_TABLET | Freq: Two times a day (BID) | ORAL | Status: DC
  Administered 2021-08-29 – 2021-09-02 (×8): 200 mg
  Filled 2021-08-29 (×8): qty 1

## 2021-08-29 MED ORDER — NUTRISOURCE FIBER PO PACK
1.0000 | PACK | Freq: Two times a day (BID) | ORAL | Status: DC
  Administered 2021-08-29 – 2021-09-12 (×27): 1
  Filled 2021-08-29 (×27): qty 1

## 2021-08-29 MED ORDER — FLUCONAZOLE IN SODIUM CHLORIDE 400-0.9 MG/200ML-% IV SOLN
400.0000 mg | INTRAVENOUS | Status: AC
  Administered 2021-08-29 – 2021-09-03 (×6): 400 mg via INTRAVENOUS
  Filled 2021-08-29 (×8): qty 200

## 2021-08-29 MED ORDER — SODIUM BICARBONATE 650 MG PO TABS: 650.0000 mg | ORAL_TABLET | Freq: Once | ORAL | Status: DC
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
## 2021-08-29 NOTE — Progress Notes (Signed)
RT NOTE: patient was placed back on full support ventilator settings due to RT noting that patient was beginning to use more accessory muscles compared to this AM and sats were 94%.  Will continue to monitor.

## 2021-08-29 NOTE — Progress Notes (Signed)
RT NOTE: patient placed on CPAP/PSV of 15/5 at [phone].  Patient is currently tolerating well.  Per MD, will be a slow wean.  Will continue to monitor and wean as tolerated.
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
## 2021-08-29 NOTE — Progress Notes (Signed)
SLP Cancellation Note  Patient Details Name: Jaxtyn Linville MRN: [id number] DOB: 2000/07/07   Cancelled treatment:       Reason Eval/Treat Not Completed: Medical issues which prohibited therapy (on vent - slow wean per chart review/discussion with RN). Will continue to follow.     Mahala Menghini., M.A. CCC-SLP Acute Rehabilitation Services Office [phone]  Secure chat preferred  08/29/2021, 10:23 AM

## 2021-08-29 NOTE — Progress Notes (Signed)
Dr. Denese Killings notified that morning CXR was complete and radiologist report was verbally reported to him. Patient condition stable, no new orders at this time.   Aalivia Mcgraw C. Val Eagle, RN 08/29/2021 0830

## 2021-08-29 NOTE — Procedures (Signed)
Cortrak  Person Inserting Tube:  Clovis Riley, Iriel Nason L, RD Tube Type:  Cortrak - 43 inches Tube Size:  10 Tube Location:  Right nare Secured by: Bridle Technique Used to Measure Tube Placement:  Marking at nare/corner of mouth Cortrak Secured At:  85 cm   Cortrak Tube Team Note:  Consult received due to clogged Cortrak feeding tube. RD unable to unclog tube. Tube was replaced with new feeding tube.   X-ray is required, abdominal x-ray has been ordered by the Cortrak team. Please confirm tube placement before using the Cortrak tube.   If the tube becomes dislodged please keep the tube and contact the Cortrak team at [email or web address] (password [email or web address]) for replacement.  If after hours and replacement cannot be delayed, place a NG tube and confirm placement with an abdominal x-ray.    Kirby Crigler RD, LDN Clinical Dietitian See Loretha Stapler for contact information.
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
## 2021-08-29 NOTE — Progress Notes (Signed)
OT Note There are pins located in the first metacarpal, covered by gauze. R Thumb Spica Splint to be worn at all times with  the exception of skin checks at least 1x/shift and for daily pin care. Please keep RUE supported on a pillow. If there are any problems with the splint, or if there is redness which does not go away 15 min after splint removed, please call OT @ [phone]. thanks Media Information   Document Information  Photos    08/29/2021 14:05  Attached To:  Hospital Encounter on 08/05/21   Source Information  Elvina Bosch, Lorinda Creed, OT  [facility]

## 2021-08-30 DIAGNOSIS — S299XXA Unspecified injury of thorax, initial encounter: Secondary | ICD-10-CM | POA: Diagnosis not present

## 2021-08-30 LAB — GLUCOSE, CAPILLARY
Glucose-Capillary: 129 mg/dL — ABNORMAL HIGH (ref 70–99)
Glucose-Capillary: 134 mg/dL — ABNORMAL HIGH (ref 70–99)
Glucose-Capillary: 135 mg/dL — ABNORMAL HIGH (ref 70–99)
Glucose-Capillary: 145 mg/dL — ABNORMAL HIGH (ref 70–99)
Glucose-Capillary: 147 mg/dL — ABNORMAL HIGH (ref 70–99)
Glucose-Capillary: 92 mg/dL (ref 70–99)
Glucose-Capillary: 97 mg/dL (ref 70–99)

## 2021-08-30 LAB — CULTURE, RESPIRATORY W GRAM STAIN
Culture: NO GROWTH
Culture: NO GROWTH
Culture: NO GROWTH
Culture: NO GROWTH
Gram Stain: NONE SEEN
Gram Stain: NONE SEEN
Gram Stain: NONE SEEN

## 2021-08-30 LAB — CBC
HCT: 24.9 % — ABNORMAL LOW (ref 39.0–52.0)
Hemoglobin: 7.7 g/dL — ABNORMAL LOW (ref 13.0–17.0)
MCH: 30.2 pg (ref 26.0–34.0)
MCHC: 30.9 g/dL (ref 30.0–36.0)
MCV: 97.6 fL (ref 80.0–100.0)
Platelets: 350 10*3/uL (ref 150–400)
RBC: 2.55 MIL/uL — ABNORMAL LOW (ref 4.22–5.81)
RDW: 15.8 % — ABNORMAL HIGH (ref 11.5–15.5)
WBC: 11.5 10*3/uL — ABNORMAL HIGH (ref 4.0–10.5)
nRBC: 0 % (ref 0.0–0.2)

## 2021-08-30 LAB — BASIC METABOLIC PANEL
Anion gap: 6 (ref 5–15)
BUN: 28 mg/dL — ABNORMAL HIGH (ref 6–20)
CO2: 24 mmol/L (ref 22–32)
Calcium: 8.3 mg/dL — ABNORMAL LOW (ref 8.9–10.3)
Chloride: 108 mmol/L (ref 98–111)
Creatinine, Ser: 0.61 mg/dL (ref 0.61–1.24)
GFR, Estimated: >60 mL/min (ref >60)
Glucose, Bld: 115 mg/dL — ABNORMAL HIGH (ref 70–99)
Potassium: 3.5 mmol/L (ref 3.5–5.1)
Sodium: 138 mmol/L (ref 135–145)

## 2021-08-30 LAB — HEPATIC FUNCTION PANEL
ALT: 197 U/L — ABNORMAL HIGH (ref 0–44)
AST: 122 U/L — ABNORMAL HIGH (ref 15–41)
Albumin: 2 g/dL — ABNORMAL LOW (ref 3.5–5.0)
Alkaline Phosphatase: 165 U/L — ABNORMAL HIGH (ref 38–126)
Bilirubin, Direct: 0.1 mg/dL (ref 0.0–0.2)
Indirect Bilirubin: 0.6 mg/dL (ref 0.3–0.9)
Total Bilirubin: 0.7 mg/dL (ref 0.3–1.2)
Total Protein: 5.5 g/dL — ABNORMAL LOW (ref 6.5–8.1)

## 2021-08-30 LAB — PROCALCITONIN: Procalcitonin: 0.3 ng/mL

## 2021-08-30 MED ORDER — AMANTADINE HCL 50 MG/5ML PO SOLN
100.0000 mg | Freq: Two times a day (BID) | ORAL | Status: DC
  Administered 2021-08-30 – 2021-09-06 (×15): 100 mg
  Filled 2021-08-30 (×19): qty 10

## 2021-08-30 MED ORDER — AMANTADINE HCL 50 MG/5ML PO SOLN: 100.0000 mg | Freq: Two times a day (BID) | ORAL | Status: DC

## 2021-08-30 MED ORDER — CLONIDINE HCL 0.2 MG/24HR TD PTWK
0.2000 mg | MEDICATED_PATCH | TRANSDERMAL | Status: DC
  Administered 2021-08-30: 0.2 mg via TRANSDERMAL
  Filled 2021-08-30: qty 1

## 2021-08-30 MED ORDER — MELATONIN 5 MG PO TABS
5.0000 mg | ORAL_TABLET | Freq: Every day | ORAL | Status: DC
  Administered 2021-08-30 – 2021-09-14 (×16): 5 mg
  Filled 2021-08-30 (×16): qty 1
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
## 2021-08-31 ENCOUNTER — Inpatient Hospital Stay (HOSPITAL_COMMUNITY): Payer: Medicaid Other

## 2021-08-31 DIAGNOSIS — S299XXA Unspecified injury of thorax, initial encounter: Secondary | ICD-10-CM | POA: Diagnosis not present

## 2021-08-31 LAB — GLUCOSE, CAPILLARY
Glucose-Capillary: 115 mg/dL — ABNORMAL HIGH (ref 70–99)
Glucose-Capillary: 118 mg/dL — ABNORMAL HIGH (ref 70–99)
Glucose-Capillary: 119 mg/dL — ABNORMAL HIGH (ref 70–99)
Glucose-Capillary: 141 mg/dL — ABNORMAL HIGH (ref 70–99)
Glucose-Capillary: 130 mg/dL — ABNORMAL HIGH (ref 70–99)
Glucose-Capillary: 110 mg/dL — ABNORMAL HIGH (ref 70–99)

## 2021-08-31 LAB — CULTURE, RESPIRATORY W GRAM STAIN

## 2021-08-31 LAB — HEPARIN ANTI-XA: Heparin LMW: 0.54 IU/mL

## 2021-08-31 MED ORDER — ENOXAPARIN SODIUM 100 MG/ML IJ SOSY
90.0000 mg | PREFILLED_SYRINGE | Freq: Two times a day (BID) | INTRAMUSCULAR | Status: DC
Start: 2021-08-31 — End: 2021-09-02
  Administered 2021-08-31 – 2021-09-02 (×4): 90 mg via SUBCUTANEOUS
  Filled 2021-08-31: qty 1
  Filled 2021-08-31 (×4): qty 0.9

## 2021-08-31 MED ORDER — PROPRANOLOL HCL 20 MG/5ML PO SOLN
40.0000 mg | Freq: Three times a day (TID) | ORAL | Status: DC
  Administered 2021-08-31 – 2021-09-08 (×23): 40 mg
  Filled 2021-08-31 (×26): qty 10

## 2021-08-31 NOTE — Progress Notes (Signed)
Pt placed back on full support due to increased WOB with consistent RR at 45. RN at bedside.
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
## 2021-08-31 NOTE — Progress Notes (Signed)
ANTICOAGULATION and ANTIBIOTIC CONSULT NOTE - Initial Consult  Pharmacy Consult for Lovenox and meropenem  Indication: DVT and HCAP   Allergies  Allergen Reactions   Whole Blood     Patient is Jehovah's Witness    Patient Measurements: Height: 5' 7.72" (172 cm) Weight: 84 kg (185 lb 3 oz) IBW/kg (Calculated) : 67.75 Heparin Dosing Weight: 84kg   Vital Signs: Temp: 98.7 F (37.1 C) (08/06 0800) Temp Source: Axillary (08/06 0400) BP: 141/78 (08/06 0700) Pulse Rate: 101 (08/06 0917)  Labs: Recent Labs    08/29/21 0500 08/30/21 0605 08/31/21 0920  HGB 8.5* 7.7*  --   HCT 27.8* 24.9*  --   PLT 395 350  --   HEPRLOWMOCWT  --   --  0.54  CREATININE 0.58* 0.61  --      Estimated Creatinine Clearance: 154.6 mL/min (by C-G formula based on SCr of 0.61 mg/dL).   Medical History: No past medical history on file.   Assessment: Acute DVT found on [place]. Patient's hemoglobin and platelets are stable. No bleeding noted. Patient received morning dose of prophylaxis Lovenox.   Meropenem for suspected HCAP after bronchoscopy on 8/3. Respiratory and BAL cultures collected. CT showed possible abscesses.   Anti-Xa level = 0.54, subtherapeutic, drawn appropriately  Goal of Therapy:  Anti-Xa level 0.6-1.0  Monitor platelets by anticoagulation protocol: Yes   Plan:   Given slightly subtherapeutic anti-Xa level, will increase enoxaparin dose to 69m [place] q12h  Obtain anti-Xa peak at steady state.  Continue to monitor H&H while on Lovenox.  Labs q72h per primary team  Continue meropenem 1G Q8H for suspected HCAP for a total of 7 days.  Monitor WBC, temp, SCr, and clinical s/sx of infection  CKaleen Mask8/06/2021,10:13 AM

## 2021-08-31 NOTE — Progress Notes (Addendum)
Pt placed on 60% ATC and is tolerating well at this time. Per MD pt is to attempt 2 hours of ATC weaning x's 2.

## 2021-08-31 NOTE — Progress Notes (Signed)
Pt completed 2 hrs ATC. Placed back on full vent support. RT will attempt second trial of ATC later today.
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
## 2021-08-31 NOTE — Progress Notes (Signed)
Pt placed back on ATC for another 2 hr trial. Pt is tolerating well at this time. RT to continue to monitor.

## 2021-09-01 ENCOUNTER — Inpatient Hospital Stay (HOSPITAL_COMMUNITY): Payer: Medicaid Other

## 2021-09-01 ENCOUNTER — Other Ambulatory Visit: Payer: Self-pay

## 2021-09-01 DIAGNOSIS — J690 Pneumonitis due to inhalation of food and vomit: Secondary | ICD-10-CM

## 2021-09-01 DIAGNOSIS — S299XXS Unspecified injury of thorax, sequela: Secondary | ICD-10-CM

## 2021-09-01 DIAGNOSIS — S06301A Unspecified focal traumatic brain injury with loss of consciousness of 30 minutes or less, initial encounter: Secondary | ICD-10-CM

## 2021-09-01 DIAGNOSIS — J939 Pneumothorax, unspecified: Secondary | ICD-10-CM

## 2021-09-01 DIAGNOSIS — R5081 Fever presenting with conditions classified elsewhere: Secondary | ICD-10-CM

## 2021-09-01 DIAGNOSIS — R52 Pain, unspecified: Secondary | ICD-10-CM

## 2021-09-01 LAB — GLUCOSE, CAPILLARY
Glucose-Capillary: 98 mg/dL (ref 70–99)
Glucose-Capillary: 117 mg/dL — ABNORMAL HIGH (ref 70–99)
Glucose-Capillary: 116 mg/dL — ABNORMAL HIGH (ref 70–99)
Glucose-Capillary: 141 mg/dL — ABNORMAL HIGH (ref 70–99)
Glucose-Capillary: 122 mg/dL — ABNORMAL HIGH (ref 70–99)
Glucose-Capillary: 122 mg/dL — ABNORMAL HIGH (ref 70–99)

## 2021-09-01 LAB — BASIC METABOLIC PANEL
Anion gap: 8 (ref 5–15)
BUN: 23 mg/dL — ABNORMAL HIGH (ref 6–20)
CO2: 27 mmol/L (ref 22–32)
Calcium: 8.2 mg/dL — ABNORMAL LOW (ref 8.9–10.3)
Chloride: 106 mmol/L (ref 98–111)
Creatinine, Ser: 0.49 mg/dL — ABNORMAL LOW (ref 0.61–1.24)
GFR, Estimated: >60 mL/min (ref >60)
Glucose, Bld: 121 mg/dL — ABNORMAL HIGH (ref 70–99)
Potassium: 3.4 mmol/L — ABNORMAL LOW (ref 3.5–5.1)
Sodium: 141 mmol/L (ref 135–145)

## 2021-09-01 LAB — CBC
HCT: 29.3 % — ABNORMAL LOW (ref 39.0–52.0)
Hemoglobin: 9.2 g/dL — ABNORMAL LOW (ref 13.0–17.0)
MCH: 30.5 pg (ref 26.0–34.0)
MCHC: 31.4 g/dL (ref 30.0–36.0)
MCV: 97 fL (ref 80.0–100.0)
Platelets: 379 10*3/uL (ref 150–400)
RBC: 3.02 MIL/uL — ABNORMAL LOW (ref 4.22–5.81)
RDW: 15.9 % — ABNORMAL HIGH (ref 11.5–15.5)
WBC: 11.2 10*3/uL — ABNORMAL HIGH (ref 4.0–10.5)
nRBC: 0 % (ref 0.0–0.2)

## 2021-09-01 LAB — HEPATIC FUNCTION PANEL
ALT: 279 U/L — ABNORMAL HIGH (ref 0–44)
AST: 97 U/L — ABNORMAL HIGH (ref 15–41)
Albumin: 2.2 g/dL — ABNORMAL LOW (ref 3.5–5.0)
Alkaline Phosphatase: 177 U/L — ABNORMAL HIGH (ref 38–126)
Bilirubin, Direct: 0.1 mg/dL (ref 0.0–0.2)
Indirect Bilirubin: 0.2 mg/dL — ABNORMAL LOW (ref 0.3–0.9)
Total Bilirubin: 0.3 mg/dL (ref 0.3–1.2)
Total Protein: 5.4 g/dL — ABNORMAL LOW (ref 6.5–8.1)

## 2021-09-01 LAB — CMV DNA BY PCR, QUALITATIVE: CMV DNA, Qual PCR: NEGATIVE

## 2021-09-01 MED ORDER — TRAMADOL 5 MG/ML ORAL SUSPENSION
50.0000 mg | Freq: Four times a day (QID) | ORAL | Status: DC
  Filled 2021-09-01: qty 10

## 2021-09-01 MED ORDER — POTASSIUM CHLORIDE 20 MEQ PO PACK
40.0000 meq | PACK | ORAL | Status: AC
  Administered 2021-09-01 (×2): 40 meq
  Filled 2021-09-01 (×2): qty 2

## 2021-09-01 MED ORDER — TRAMADOL HCL 50 MG PO TABS
50.0000 mg | ORAL_TABLET | Freq: Four times a day (QID) | ORAL | Status: DC
  Administered 2021-09-01 – 2021-09-09 (×32): 50 mg
  Filled 2021-09-01 (×32): qty 1

## 2021-09-01 MED ORDER — PANTOPRAZOLE 2 MG/ML SUSPENSION
40.0000 mg | Freq: Every day | ORAL | Status: DC
Start: 2021-09-02 — End: 2021-09-15
  Administered 2021-09-02 – 2021-09-15 (×12): 40 mg
  Filled 2021-09-01 (×12): qty 20

## 2021-09-01 MED ORDER — CHLORHEXIDINE GLUCONATE CLOTH 2 % EX PADS
6.0000 | MEDICATED_PAD | Freq: Every day | CUTANEOUS | Status: DC
  Administered 2021-09-01 – 2021-09-14 (×14): 6 via TOPICAL

## 2021-09-01 MED ORDER — METHOCARBAMOL 500 MG PO TABS
500.0000 mg | ORAL_TABLET | Freq: Four times a day (QID) | ORAL | Status: DC
  Administered 2021-09-01 – 2021-09-06 (×23): 500 mg
  Filled 2021-09-01 (×23): qty 1
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
## 2021-09-01 NOTE — Progress Notes (Signed)
Pt placed on 40% ATC and is tolerating well at this time. RN at bedside.

## 2021-09-01 NOTE — Progress Notes (Signed)
Pt placed back on full vent support due to increased WOB. Pt tolerated 3 hours of ATC.

## 2021-09-01 NOTE — Progress Notes (Signed)
SLP Cancellation Note  Patient Details Name: Jeff [name] MRN: [id number] DOB: 2000/09/12   Cancelled treatment:       Reason Eval/Treat Not Completed: Medical issues which prohibited therapy. Pt back on vent after spending ~3 hours on TC. RN suggests attempting therapy earlier in the day, as plan at this point is to try to extend morning TC trials as much as able. Will continue to follow.    Mahala Menghini., M.A. CCC-SLP Acute Rehabilitation Services Office [phone]  Secure chat preferred  09/01/2021, 3:31 PM
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
## 2021-09-01 NOTE — Progress Notes (Signed)
Ortho PA contacted due to change in appearance of traction pins. Not concerning to PA and no new orders given at this time.  Beryl Meager, RN

## 2021-09-01 NOTE — Plan of Care (Signed)
  Problem: Clinical Measurements: Goal: Will remain free from infection Outcome: Progressing Goal: Diagnostic test results will improve Outcome: Progressing Goal: Respiratory complications will improve Outcome: Progressing Goal: Cardiovascular complication will be avoided Outcome: Progressing   Problem: Activity: Goal: Risk for activity intolerance will decrease Outcome: Progressing   Problem: Nutrition: Goal: Adequate nutrition will be maintained Outcome: Progressing   Problem: Coping: Goal: Level of anxiety will decrease Outcome: Progressing   Problem: Elimination: Goal: Will not experience complications related to bowel motility Outcome: Progressing Goal: Will not experience complications related to urinary retention Outcome: Progressing   Problem: Pain Managment: Goal: General experience of comfort will improve Outcome: Progressing   Problem: Safety: Goal: Ability to remain free from injury will improve Outcome: Progressing   Problem: Skin Integrity: Goal: Risk for impaired skin integrity will decrease Outcome: Progressing   Problem: Clinical Measurements: Goal: Will remain free from infection Outcome: Progressing Goal: Diagnostic test results will improve Outcome: Progressing Goal: Respiratory complications will improve Outcome: Progressing Goal: Cardiovascular complication will be avoided Outcome: Progressing   Problem: Nutrition: Goal: Adequate nutrition will be maintained Outcome: Progressing   Problem: Coping: Goal: Level of anxiety will decrease Outcome: Progressing   Problem: Elimination: Goal: Will not experience complications related to bowel motility Outcome: Progressing Goal: Will not experience complications related to urinary retention Outcome: Progressing   Problem: Pain Managment: Goal: General experience of comfort will improve Outcome: Progressing   Problem: Safety: Goal: Ability to remain free from injury will  improve Outcome: Progressing   Problem: Activity: Goal: Ability to tolerate increased activity will improve Outcome: Progressing   Problem: Respiratory: Goal: Ability to maintain a clear airway and adequate ventilation will improve Outcome: Progressing   Problem: Role Relationship: Goal: Method of communication will improve Outcome: Progressing   Problem: Coping: Goal: Ability to adjust to condition or change in health will improve Outcome: Progressing   Problem: Fluid Volume: Goal: Ability to maintain a balanced intake and output will improve Outcome: Progressing   Problem: Metabolic: Goal: Ability to maintain appropriate glucose levels will improve Outcome: Progressing   Problem: Nutritional: Goal: Maintenance of adequate nutrition will improve Outcome: Progressing
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
## 2021-09-02 DIAGNOSIS — T07XXXA Unspecified multiple injuries, initial encounter: Secondary | ICD-10-CM | POA: Diagnosis not present

## 2021-09-02 DIAGNOSIS — I82629 Acute embolism and thrombosis of deep veins of unspecified upper extremity: Secondary | ICD-10-CM

## 2021-09-02 DIAGNOSIS — I824Y1 Acute embolism and thrombosis of unspecified deep veins of right proximal lower extremity: Secondary | ICD-10-CM

## 2021-09-02 DIAGNOSIS — S270XXA Traumatic pneumothorax, initial encounter: Secondary | ICD-10-CM

## 2021-09-02 DIAGNOSIS — S225XXA Flail chest, initial encounter for closed fracture: Secondary | ICD-10-CM

## 2021-09-02 DIAGNOSIS — S2242XA Multiple fractures of ribs, left side, initial encounter for closed fracture: Secondary | ICD-10-CM

## 2021-09-02 HISTORY — DX: Acute embolism and thrombosis of unspecified deep veins of right proximal lower extremity: I82.4Y1

## 2021-09-02 HISTORY — DX: Flail chest, initial encounter for closed fracture: S22.5XXA

## 2021-09-02 HISTORY — DX: Acute embolism and thrombosis of deep veins of unspecified upper extremity: I82.629

## 2021-09-02 LAB — MAGNESIUM: Magnesium: 2.3 mg/dL (ref 1.7–2.4)

## 2021-09-02 LAB — CULTURE, BLOOD (ROUTINE X 2)
Culture: NO GROWTH
Culture: NO GROWTH
Special Requests: ADEQUATE
Special Requests: ADEQUATE

## 2021-09-02 LAB — GLUCOSE, CAPILLARY
Glucose-Capillary: 108 mg/dL — ABNORMAL HIGH (ref 70–99)
Glucose-Capillary: 131 mg/dL — ABNORMAL HIGH (ref 70–99)
Glucose-Capillary: 133 mg/dL — ABNORMAL HIGH (ref 70–99)
Glucose-Capillary: 138 mg/dL — ABNORMAL HIGH (ref 70–99)
Glucose-Capillary: 166 mg/dL — ABNORMAL HIGH (ref 70–99)
Glucose-Capillary: 96 mg/dL (ref 70–99)

## 2021-09-02 LAB — COMPREHENSIVE METABOLIC PANEL
ALT: 269 U/L — ABNORMAL HIGH (ref 0–44)
AST: 76 U/L — ABNORMAL HIGH (ref 15–41)
Albumin: 2.3 g/dL — ABNORMAL LOW (ref 3.5–5.0)
Alkaline Phosphatase: 179 U/L — ABNORMAL HIGH (ref 38–126)
Anion gap: 5 (ref 5–15)
BUN: 26 mg/dL — ABNORMAL HIGH (ref 6–20)
CO2: 27 mmol/L (ref 22–32)
Calcium: 8.2 mg/dL — ABNORMAL LOW (ref 8.9–10.3)
Chloride: 104 mmol/L (ref 98–111)
Creatinine, Ser: 0.4 mg/dL — ABNORMAL LOW (ref 0.61–1.24)
GFR, Estimated: >60 mL/min (ref >60)
Glucose, Bld: 104 mg/dL — ABNORMAL HIGH (ref 70–99)
Potassium: 3.9 mmol/L (ref 3.5–5.1)
Sodium: 136 mmol/L (ref 135–145)
Total Bilirubin: 0.4 mg/dL (ref 0.3–1.2)
Total Protein: 5.9 g/dL — ABNORMAL LOW (ref 6.5–8.1)

## 2021-09-02 LAB — PHOSPHORUS: Phosphorus: 3.1 mg/dL (ref 2.5–4.6)

## 2021-09-02 LAB — HEPARIN ANTI-XA: Heparin LMW: 0.49 IU/mL

## 2021-09-02 MED ORDER — VITAL 1.5 CAL PO LIQD
1000.0000 mL | ORAL | Status: DC
  Administered 2021-09-03 – 2021-09-12 (×8): 1000 mL

## 2021-09-02 MED ORDER — LOPERAMIDE HCL 1 MG/7.5ML PO SUSP
2.0000 mg | ORAL | Status: DC | PRN
  Administered 2021-09-02 – 2021-09-13 (×30): 2 mg
  Filled 2021-09-02 (×32): qty 15

## 2021-09-02 MED ORDER — PROSOURCE TF PO LIQD
90.0000 mL | Freq: Four times a day (QID) | ORAL | Status: DC
  Administered 2021-09-02 – 2021-09-08 (×23): 90 mL
  Filled 2021-09-02 (×27): qty 90

## 2021-09-02 MED ORDER — ENOXAPARIN SODIUM 100 MG/ML IJ SOSY
100.0000 mg | PREFILLED_SYRINGE | Freq: Two times a day (BID) | INTRAMUSCULAR | Status: DC
  Administered 2021-09-02 – 2021-09-03 (×3): 100 mg via SUBCUTANEOUS
  Filled 2021-09-02 (×5): qty 1

## 2021-09-02 MED ORDER — CARBAMAZEPINE 200 MG PO TABS
200.0000 mg | ORAL_TABLET | Freq: Every day | ORAL | Status: DC
Start: 2021-09-03 — End: 2021-09-05
  Administered 2021-09-03 – 2021-09-04 (×2): 200 mg
  Filled 2021-09-02 (×3): qty 1
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
## 2021-09-02 NOTE — Progress Notes (Signed)
ANTICOAGULATION and ANTIBIOTIC CONSULT NOTE - Follow Up Consult  Pharmacy Consult for Lovenox and meropenem  Indication: DVT and HCAP   Allergies  Allergen Reactions   Whole Blood     Patient is Jehovah's Witness    Patient Measurements: Height: 5' 7.72" (172 cm) Weight: 84 kg (185 lb 3 oz) IBW/kg (Calculated) : 67.75 Heparin Dosing Weight: 84kg   Vital Signs: Temp: 100.1 F (37.8 C) (08/08 0800) Temp Source: Axillary (08/08 0800) BP: 121/76 (08/08 1200) Pulse Rate: 88 (08/08 1200)  Labs: Recent Labs    08/31/21 0920 09/01/21 0745 09/01/21 0827 09/02/21 0433 09/02/21 0950  HGB  --  9.2*  --   --   --   HCT  --  29.3*  --   --   --   PLT  --  379  --   --   --   HEPRLOWMOCWT 0.54  --   --   --  0.49  CREATININE  --   --  0.49* 0.40*  --      Estimated Creatinine Clearance: 154.6 mL/min (A) (by C-G formula based on SCr of 0.4 mg/dL (L)).   Medical History: No past medical history on file.   Assessment: Lovenox started 8/6 for acute DVT. Anti-Xa level while on 77m Q12H resulted at 0.54. Dose was increased to 964mQ12H which resulted in an anti-Xa level of 0.49 (subtherapeutic).  Meropenem for suspected HCAP after bronchoscopy on 8/3. Respiratory and BAL cultures with no growth. Currently day 6 of therapy.  Renal function remains stable, with UOP 1.9 mL/kg/hr.  Goal of Therapy:  Anti-Xa level 0.6-1.0  Monitor platelets by anticoagulation protocol: Yes   Plan:  Anti-Xa level remains subtherapeutic despite dose increase. Will increase to enoxaparin 10027m12H. Obtain anti-Xa peak at steady state (8/10) Continue to monitor H&H while on Lovenox.  Labs q72h per primary team  Continue meropenem 1G Q8H for suspected HCAP for a total of 7 days. Stop date entered for 8/10 Monitor WBC, temp, SCr, and clinical s/sx of infection  LorErskine SpeedharmD 09/02/2021,12:30 PM
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
## 2021-09-02 NOTE — Progress Notes (Signed)
SLP Cancellation Note  Patient Details Name: Jeff [name] MRN: [id number] DOB: [birth year]   Cancelled treatment:       Reason Eval/Treat Not Completed: Patient not medically ready. Checked in with RN. Pt weaning this am, but is fatigued. Goal is 4 hours ATC today but unclear yet if he will tolerate. PMSV is a lower priority. Will f/u tomorrow to see if pt has more endurance.    Svea Pusch, Riley Nearing 09/02/2021, 10:35 AM
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
## 2021-09-02 NOTE — Consult Note (Signed)
[facility] Followup Face-to-Face Psychiatric Evaluation  Name: Jeff [name] DOB: [birth year]  MRN: [id number]  Service Date: September 02, 2021 LOS: Jeff [name] is a 21 y.o. male admitted medically for 08/05/2021  5:47 PM for respiratory failure. PMH TBI. Psychiatry was consulted by trauma team for agitation.  Started on Seroquel for agitation, but was discontinued 2/2 dystonia and elevated CK with suspicion for NMS to good effect. Tegretol 200 mg BID and diazepam 10 mg q6hr was started for agitation. Diazepam was tapered off due to oversedation (end 8/12). Tegretol was tapered off due to elevated LFTs and improvement in AMS.   Agitation, TBI Patient is now awake, alert, able to follow commands.  Was working with PT/OT.  Given this we will continue to down titrate Tegretol especially since his LFTs are elevated.  Diagnoses:  Active Hospital problems: Principal Problem:   Critical polytrauma Active Problems:   Trauma of chest   ARDS (adult respiratory distress syndrome) ([name])   Contusion of left lung   Acute on chronic respiratory failure with hypoxia and hypercapnia ([name])   TBI (traumatic brain injury) ([name])   Acute respiratory failure with hypoxia ([name])   Closed fracture of posterior wall of right acetabulum ([name])   Closed dislocation of right hip ([name])   Degloving injury of lower leg, right, initial encounter   Open fracture of shaft of metacarpal bone of right thumb   Closed displaced segmental fracture of shaft of left humerus   Agitation requiring sedation protocol   Pressure injury of skin   Acute metabolic encephalopathy   Aspiration pneumonia ([name])   Closed fracture of multiple ribs with flail chest   Closed traumatic fracture of ribs of left side with pneumothorax   DVT, lower extremity, proximal, acute, right ([name])   DVT of upper extremity (deep vein thrombosis) ([name])   Plan  ## Safety and Observation Level:  - Based on my  clinical evaluation, I estimate the patient to be at low risk of self harm in the current setting - At this time, we recommend a routine level of observation. This decision is based on my review of the chart including patient's history and current presentation, interview of the patient, mental status examination, and consideration of suicide risk including evaluating suicidal ideation, plan, intent, suicidal or self-harm behaviors, risk factors, and protective factors. This judgment is based on our ability to directly address suicide risk, implement suicide prevention strategies and develop a safety plan while the patient is in the clinical setting. Please contact our team if there is a concern that risk level has changed.  ## Medications:  --Decreased Tegretol 200 mg twice daily to 200 mg nightly --Diazepam taper per primary --Precedex drip and clonidine patch per primary   sodium chloride, Last Rate: Stopped (09/02/21 1159) dexmedetomidine (PRECEDEX) IV infusion, Last Rate: 0.7 mcg/kg/hr (09/02/21 1200) feeding supplement (PIVOT 1.5 CAL), Last Rate: 1,000 mL (09/02/21 1206) fluconazole (DIFLUCAN) IV, Last Rate: 100 mL/hr at 09/02/21 1200  And fluconazole (DIFLUCAN) IV, Last Rate: Stopped (09/02/21 1154) meropenem (MERREM) IV, Last Rate: Stopped (09/02/21 [id number])    ## Medical Decision Making Capacity:  Formal decision making capacity was not assessed for any particular decision as part of routine psychiatric evaluation   ## Further Work-up:  -- Per primary team  Antipsychotic labs: A1c 5.5 (08/15/2021)  ## Disposition:  -- There are no current psychiatric contraindications to discharge at this time -- Per primary team  ## Behavioral /  Environmental:  -- Delirium precautions  ##Legal Status -- Voluntary  Thank you for this consult request. Recommendations have been communicated to the primary team. We will continue to follow at this time  [name], DO  Follow-up history   Patient Report:  Patient was initially seen this AM with both sisters at bedside.  Patient was awake and alert, able to follow simple commands.  Trach collar in place, no valve.  Patient was unable to speak, but could communicate with head nods. Patient denied SI/HI/AVH.  Psychiatric History:  Information collected from chart review No psych history  Family psych history: Deferred  Social History: Deferred  Family History: Deferred The patient's family history is not on file.  Medical History: No past medical history on file.  Surgical History: Multiple surgeries  Medications:   Current Facility-Administered Medications:    0.9 %  sodium chloride infusion, , Intravenous, PRN, [name], PA-C, Stopped at 09/02/21 1159   acetaminophen (TYLENOL) tablet 1,000 mg, 1,000 mg, Per Tube, Q6H, Kinsinger, Arta Bruce, MD, 1,000 mg at 09/02/21 0931   amantadine (SYMMETREL) 50 MG/5ML solution 100 mg, 100 mg, Per Tube, BID, Agarwala, [name], MD, 100 mg at 09/02/21 0930   artificial tears (LACRILUBE) ophthalmic ointment, , Both Eyes, Q4H PRN, Hart Rochester, Given at 08/16/21 [id number]   ascorbic acid (VITAMIN C) tablet 500 mg, 500 mg, Per Tube, BID, Julian Hy, DO, 500 mg at 09/02/21 [id number]   bisacodyl (DULCOLAX) suppository 10 mg, 10 mg, Rectal, Daily PRN, [name], PA-C   [START ON 09/03/2021] carbamazepine (TEGRETOL) tablet 200 mg, 200 mg, Per Tube, QHS, [name], DO   Chlorhexidine Gluconate Cloth 2 % PADS 6 each, 6 each, Topical, Daily, Simonne Maffucci B, MD, 6 each at 09/01/21 2106   cloNIDine (CATAPRES - Dosed in mg/24 hr) patch 0.2 mg, 0.2 mg, Transdermal, Q Sat, Agarwala, [name], MD, 0.2 mg at 08/30/21 0939   dexmedetomidine (PRECEDEX) 400 MCG/100ML (4 mcg/mL) infusion, 0.4-1.2 mcg/kg/hr, Intravenous, Titrated, Lovick, [name], MD, Last Rate: 14.63 mL/hr at 09/02/21 1200, 0.7 mcg/kg/hr at 09/02/21 1200   [COMPLETED] diazepam (VALIUM) tablet 7.5 mg, 7.5 mg, Per Tube,  Q6H, 7.5 mg at 08/31/21 0945 **FOLLOWED BY** diazepam (VALIUM) tablet 5 mg, 5 mg, Per Tube, Q6H, 5 mg at 09/02/21 0931 **FOLLOWED BY** [START ON 09/03/2021] diazepam (VALIUM) tablet 2 mg, 2 mg, Per Tube, Q6H, Agarwala, [name], MD   docusate (COLACE) 50 MG/5ML liquid 100 mg, 100 mg, Per Tube, BID PRN, Thereasa Solo, Sarah A, PA-C   enoxaparin (LOVENOX) injection 100 mg, 100 mg, Subcutaneous, Q12H, Salvadore Dom E, NP   feeding supplement (PIVOT 1.5 CAL) liquid 1,000 mL, 1,000 mL, Per Tube, Continuous, Lovick, [name], MD, Last Rate: 75 mL/hr at 09/02/21 1206, 1,000 mL at 09/02/21 1206   feeding supplement (PROSource TF) liquid 45 mL, 45 mL, Per Tube, QID, Clark, Laura P, DO, 45 mL at 09/02/21 0930   fiber (NUTRISOURCE FIBER) 1 packet, 1 packet, Per Tube, BID, Agarwala, Einar Grad, MD, 1 packet at 09/02/21 0932   fluconazole (DIFLUCAN) IVPB 400 mg, 400 mg, Intravenous, Q24H, Last Rate: 100 mL/hr at 09/02/21 1200, Infusion Verify at 09/02/21 1200 **AND** fluconazole (DIFLUCAN) IVPB 400 mg, 400 mg, Intravenous, Q24H, Agarwala, [name], MD, Stopped at 09/02/21 1154   free water 200 mL, 200 mL, Per Tube, Q6H, Lovick, Ayesha N, MD, 200 mL at 09/02/21 1137   gabapentin (NEURONTIN) 250 MG/5ML solution 400 mg, 400 mg, Per Tube, Q8H, Lovick, [name], MD,  400 mg at 09/02/21 0537   HYDROmorphone (DILAUDID) injection 0.5-1 mg, 0.5-1 mg, Intravenous, Q2H PRN, Diamantina Monks, MD, 1 mg at 09/02/21 1136   insulin aspart (novoLOG) injection 0-20 Units, 0-20 Units, Subcutaneous, Q4H, Icard, Bradley L, DO, 3 Units at 09/02/21 1136   insulin aspart (novoLOG) injection 4 Units, 4 Units, Subcutaneous, Q4H, Lovick, Lennie Odor, MD, 4 Units at 09/02/21 1137   insulin detemir (LEVEMIR) injection 15 Units, 15 Units, Subcutaneous, BID, Diamantina Monks, MD, 15 Units at 09/02/21 0931   loperamide HCl (IMODIUM) 1 MG/7.5ML suspension 2 mg, 2 mg, Per Tube, Q4H PRN, Max Fickle B, MD, 2 mg at 09/02/21 1229   melatonin tablet 5 mg, 5 mg, Per Tube,  QHS, Agarwala, Daleen Bo, MD, 5 mg at 09/01/21 2117   meropenem (MERREM) 1 g in sodium chloride 0.9 % 100 mL IVPB, 1 g, Intravenous, Q8H, Agarwala, [name], MD, Stopped at 09/02/21 0607   methocarbamol (ROBAXIN) tablet 500 mg, 500 mg, Per Tube, QID, Simonne Martinet, NP, 500 mg at 09/02/21 0930   [COMPLETED] methylPREDNISolone sodium succinate (SOLU-MEDROL) 125 mg/2 mL injection 80 mg, 80 mg, Intravenous, Daily, 80 mg at 08/31/21 0946 **FOLLOWED BY** methylPREDNISolone sodium succinate (SOLU-MEDROL) 40 mg/mL injection 40 mg, 40 mg, Intravenous, Daily, Agarwala, [name], MD, 40 mg at 09/02/21 0930   midazolam (VERSED) injection 2 mg, 2 mg, Intravenous, Q4H PRN, Violeta Gelinas, MD, 2 mg at 09/01/21 1346   nutrition supplement (JUVEN) (JUVEN) powder packet 1 packet, 1 packet, Per Tube, BID BM, Violeta Gelinas, MD, 1 packet at 09/02/21 0932   ondansetron (ZOFRAN) tablet 4 mg, 4 mg, Per Tube, Q6H PRN **OR** ondansetron (ZOFRAN) injection 4 mg, 4 mg, Intravenous, Q6H PRN, McClung, Sarah A, PA-C   Oral care mouth rinse, 15 mL, Mouth Rinse, Q2H, Lovick, Lennie Odor, MD, 15 mL at 09/02/21 1137   [EXPIRED] oxyCODONE (Oxy IR/ROXICODONE) immediate release tablet 10 mg, 10 mg, Per Tube, Q4H, 10 mg at 08/31/21 1124 **FOLLOWED BY** oxyCODONE (Oxy IR/ROXICODONE) immediate release tablet 7.5 mg, 7.5 mg, Per Tube, Q4H, 7.5 mg at 09/02/21 1136 **FOLLOWED BY** [START ON 09/03/2021] oxyCODONE (Oxy IR/ROXICODONE) immediate release tablet 5 mg, 5 mg, Per Tube, Q4H **FOLLOWED BY** [START ON 09/06/2021] oxyCODONE (Oxy IR/ROXICODONE) immediate release tablet 2.5 mg, 2.5 mg, Per Tube, Q4H **FOLLOWED BY** [START ON 09/09/2021] oxyCODONE (Oxy IR/ROXICODONE) immediate release tablet 2.5 mg, 2.5 mg, Per Tube, Q6H, Agarwala, [name], MD   oxyCODONE (ROXICODONE) 5 MG/5ML solution 10 mg, 10 mg, Per Tube, Q4H PRN, Agarwala, [name], MD   pantoprazole sodium (PROTONIX) 40 mg/20 mL oral suspension 40 mg, 40 mg, Per Tube, Daily, Simonne Martinet, NP, 40 mg at  09/02/21 0929   polyethylene glycol (MIRALAX / GLYCOLAX) packet 17 g, 17 g, Per Tube, Daily PRN, [place] Bali, PA-C   propranolol (INDERAL) 20 MG/5ML solution 40 mg, 40 mg, Per Tube, TID, Agarwala, [name], MD, 40 mg at 09/02/21 0932   sodium chloride flush (NS) 0.9 % injection 10-40 mL, 10-40 mL, Intracatheter, Q12H, Haddix, Gillie Manners, MD, 30 mL at 09/02/21 0933   sodium chloride flush (NS) 0.9 % injection 10-40 mL, 10-40 mL, Intracatheter, PRN, Haddix, Gillie Manners, MD   traMADol (ULTRAM) tablet 50 mg, 50 mg, Per Tube, Q6H, Simonne Martinet, NP, 50 mg at 09/02/21 1136   zinc sulfate capsule 220 mg, 220 mg, Per Tube, Daily, Violeta Gelinas, MD, 220 mg at 09/02/21 1136  Allergies: Allergies  Allergen Reactions   Whole Blood  Patient is Jehovah's Witness     Objective  Vital signs:  Temp:  [98.9 F (37.2 C)-100.2 F (37.9 C)] 100.1 F (37.8 C) (08/08 0800) Pulse Rate:  [76-103] 88 (08/08 1200) Resp:  [15-29] 17 (08/08 1200) BP: (101-132)/(61-95) 121/76 (08/08 1200) SpO2:  [95 %-100 %] 98 % (08/08 1200) FiO2 (%):  [40 %] 40 % (08/08 1102)  Psychiatric Specialty Exam: General Appearance: Fairly Groomed (trach collar in place.  NG tube in place)   Eye Contact: Fair   Speech: Other (comment) (Trach collar)   Volume: Other (comment) (Trach collar)    Mood: Depressed  Affect: Appropriate; Congruent; Full Range    Thought Process: Other (comment) (Limited evaluation due to patient unable to talk currently)  Descriptions of Associations: Intact   Orientation: Other (comment) (Limited evaluation due to trach collar)   Thought Content: Logical; WDL  Hallucinations: None  Ideas of Reference: None   Suicidal Thoughts: No  Homicidal Thoughts: No   Memory: Other (comment) (Limited evaluation due to trach collar)    Judgement: Fair  Insight: Fair    Psychomotor Activity: Normal    Concentration: Good  Attention Span: Good  Recall: Good    Fund  of Knowledge: Good    Language: Good    Handed: Right    Assets: Communication Skills; Desire for Improvement; Social Support; Resilience    Sleep: Fair       AIMS:   ,  ,  ,  ,     CIWA:  COWS:     Physical Exam: Physical Exam Vitals and nursing note reviewed.  Constitutional:      General: He is not in acute distress.    Appearance: He is ill-appearing. He is not diaphoretic.  HENT:     Head: Normocephalic.  Pulmonary:     Effort: Pulmonary effort is normal. No respiratory distress.  Skin:    General: Skin is warm and dry.  Neurological:     General: No focal deficit present.     Mental Status: He is alert.     Motor: Weakness present. No tremor.     Comments: Can follow midline and unilateral commands  Psychiatric:        Behavior: Behavior is cooperative.     Review of Systems  Constitutional:  Negative for diaphoresis.  Respiratory:  Positive for cough.   Gastrointestinal:  Negative for vomiting.  Neurological:  Negative for tremors.   Blood pressure 121/76, pulse 88, temperature 100.1 F (37.8 C), temperature source Axillary, resp. rate 17, height 5' 7.72" (1.72 m), weight 84 kg, SpO2 98 %. Body mass index is 28.39 kg/m.  Signed: Princess Bruins, DO Psychiatry Resident, PGY-2 [facility] 09/02/2021, 12:42 PM

## 2021-09-03 DIAGNOSIS — S299XXS Unspecified injury of thorax, sequela: Secondary | ICD-10-CM | POA: Diagnosis not present

## 2021-09-03 LAB — COMPREHENSIVE METABOLIC PANEL
ALT: 218 U/L — ABNORMAL HIGH (ref 0–44)
AST: 47 U/L — ABNORMAL HIGH (ref 15–41)
Albumin: 2.5 g/dL — ABNORMAL LOW (ref 3.5–5.0)
Alkaline Phosphatase: 195 U/L — ABNORMAL HIGH (ref 38–126)
Anion gap: 7 (ref 5–15)
BUN: 22 mg/dL — ABNORMAL HIGH (ref 6–20)
CO2: 28 mmol/L (ref 22–32)
Calcium: 8.5 mg/dL — ABNORMAL LOW (ref 8.9–10.3)
Chloride: 100 mmol/L (ref 98–111)
Creatinine, Ser: 0.42 mg/dL — ABNORMAL LOW (ref 0.61–1.24)
GFR, Estimated: >60 mL/min (ref >60)
Glucose, Bld: 104 mg/dL — ABNORMAL HIGH (ref 70–99)
Potassium: 3.9 mmol/L (ref 3.5–5.1)
Sodium: 135 mmol/L (ref 135–145)
Total Bilirubin: 0.4 mg/dL (ref 0.3–1.2)
Total Protein: 5.9 g/dL — ABNORMAL LOW (ref 6.5–8.1)

## 2021-09-03 LAB — CBC WITH DIFFERENTIAL/PLATELET
Abs Immature Granulocytes: 0.48 10*3/uL — ABNORMAL HIGH (ref 0.00–0.07)
Basophils Absolute: 0.1 10*3/uL (ref 0.0–0.1)
Basophils Relative: 1 %
Eosinophils Absolute: 0.5 10*3/uL (ref 0.0–0.5)
Eosinophils Relative: 3 %
HCT: 32.2 % — ABNORMAL LOW (ref 39.0–52.0)
Hemoglobin: 10 g/dL — ABNORMAL LOW (ref 13.0–17.0)
Immature Granulocytes: 4 %
Lymphocytes Relative: 34 %
Lymphs Abs: 4.6 10*3/uL — ABNORMAL HIGH (ref 0.7–4.0)
MCH: 30.1 pg (ref 26.0–34.0)
MCHC: 31.1 g/dL (ref 30.0–36.0)
MCV: 97 fL (ref 80.0–100.0)
Monocytes Absolute: 0.8 10*3/uL (ref 0.1–1.0)
Monocytes Relative: 6 %
Neutro Abs: 7.1 10*3/uL (ref 1.7–7.7)
Neutrophils Relative %: 52 %
Platelets: 410 10*3/uL — ABNORMAL HIGH (ref 150–400)
RBC: 3.32 MIL/uL — ABNORMAL LOW (ref 4.22–5.81)
RDW: 16.3 % — ABNORMAL HIGH (ref 11.5–15.5)
WBC: 13.6 10*3/uL — ABNORMAL HIGH (ref 4.0–10.5)
nRBC: 0 % (ref 0.0–0.2)

## 2021-09-03 LAB — GLUCOSE, CAPILLARY
Glucose-Capillary: 107 mg/dL — ABNORMAL HIGH (ref 70–99)
Glucose-Capillary: 108 mg/dL — ABNORMAL HIGH (ref 70–99)
Glucose-Capillary: 113 mg/dL — ABNORMAL HIGH (ref 70–99)
Glucose-Capillary: 118 mg/dL — ABNORMAL HIGH (ref 70–99)
Glucose-Capillary: 120 mg/dL — ABNORMAL HIGH (ref 70–99)
Glucose-Capillary: 98 mg/dL (ref 70–99)

## 2021-09-03 MED ORDER — FREE WATER
200.0000 mL | Freq: Three times a day (TID) | Status: DC
  Administered 2021-09-03 (×2): 200 mL

## 2021-09-03 NOTE — Progress Notes (Signed)
Placed patient back on full vent support due to increased wob.
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
## 2021-09-03 NOTE — Anesthesia Preprocedure Evaluation (Addendum)
Anesthesia Evaluation    Reviewed: Allergy & Precautions, Patient's Chart, lab work & pertinent test results, Unable to perform ROS - Chart review only  Airway Mallampati: II  TM Distance: >3 FB Neck ROM: Full    Dental no notable dental hx.    Pulmonary neg pulmonary ROS,    Pulmonary exam normal breath sounds clear to auscultation       Cardiovascular negative cardio ROS Normal cardiovascular exam Rhythm:Regular Rate:Normal     Neuro/Psych negative neurological ROS     GI/Hepatic negative GI ROS, Neg liver ROS,   Endo/Other  negative endocrine ROS  Renal/GU negative Renal ROS     Musculoskeletal negative musculoskeletal ROS (+)   Abdominal   Peds  Hematology negative hematology ROS (+)   Anesthesia Other Findings   Reproductive/Obstetrics                            Anesthesia Physical Anesthesia Plan  ASA: 3  Anesthesia Plan: General   Post-op Pain Management: Ofirmev IV (intra-op)*   Induction:   PONV Risk Score and Plan: 3 and Treatment may vary due to age or medical condition, Dexamethasone and Ondansetron  Airway Management Planned: Oral ETT  Additional Equipment:   Intra-op Plan:   Post-operative Plan: Post-operative intubation/ventilation  Informed Consent: I have reviewed the patients History and Physical, chart, labs and discussed the procedure including the risks, benefits and alternatives for the proposed anesthesia with the patient or authorized representative who has indicated his/her understanding and acceptance.     Dental advisory given  Plan Discussed with: CRNA  Anesthesia Plan Comments:        Anesthesia Quick Evaluation

## 2021-09-03 NOTE — Progress Notes (Addendum)
Orthopaedic trauma Service   No change in orthopedic exam  BP 113/63   Pulse 97   Temp 97.7 F (36.5 C) (Axillary)   Resp 16   Ht 5' 7.72" (1.72 m)   Wt 85.1 kg   SpO2 100%   BMI 28.77 kg/m    OR tomorrow for repeat irrigation debridement right thigh with possible split-thickness skin grafting and wound VAC application. Stress evaluation right hip with fluoroscopy, removal of traction pin right tibia, removal of k-wires right 1st metacarpal, removal of sutures from Left arm and right arm  \  On schedule for 0800 tomorrow Hold tube feeds at [place]  Pt on scheduled meropenem   Obtain consent from family (discuss with sister [name])   Mearl Latin, PA-C [phone] (C) 09/03/2021, 10:41 AM  [facility] [address] [place] [address] [phone] (F)       Patient ID: Jeff [name], male   DOB: 07-06-2000, 21 y.o.   MRN: [id number]

## 2021-09-04 ENCOUNTER — Inpatient Hospital Stay (HOSPITAL_COMMUNITY): Payer: Medicaid Other

## 2021-09-04 ENCOUNTER — Encounter (HOSPITAL_COMMUNITY): Payer: Self-pay | Admitting: Pulmonary Disease

## 2021-09-04 ENCOUNTER — Other Ambulatory Visit: Payer: Self-pay

## 2021-09-04 ENCOUNTER — Inpatient Hospital Stay (HOSPITAL_COMMUNITY): Payer: Medicaid Other | Admitting: Anesthesiology

## 2021-09-04 ENCOUNTER — Encounter (HOSPITAL_COMMUNITY)
Admission: EM | Disposition: A | Discharge status: Other Acute Inpatient Hospital | Payer: Self-pay | Source: Home / Self Care

## 2021-09-04 DIAGNOSIS — T07XXXA Unspecified multiple injuries, initial encounter: Secondary | ICD-10-CM | POA: Diagnosis not present

## 2021-09-04 DIAGNOSIS — S299XXA Unspecified injury of thorax, initial encounter: Secondary | ICD-10-CM

## 2021-09-04 DIAGNOSIS — T8189XA Other complications of procedures, not elsewhere classified, initial encounter: Secondary | ICD-10-CM

## 2021-09-04 HISTORY — PX: I & D EXTREMITY: SHX5045

## 2021-09-04 LAB — COMPREHENSIVE METABOLIC PANEL
ALT: 173 U/L — ABNORMAL HIGH (ref 0–44)
AST: 37 U/L (ref 15–41)
Albumin: 2.6 g/dL — ABNORMAL LOW (ref 3.5–5.0)
Alkaline Phosphatase: 229 U/L — ABNORMAL HIGH (ref 38–126)
Anion gap: 8 (ref 5–15)
BUN: 19 mg/dL (ref 6–20)
CO2: 24 mmol/L (ref 22–32)
Calcium: 8.8 mg/dL — ABNORMAL LOW (ref 8.9–10.3)
Chloride: 102 mmol/L (ref 98–111)
Creatinine, Ser: 0.44 mg/dL — ABNORMAL LOW (ref 0.61–1.24)
GFR, Estimated: >60 mL/min (ref >60)
Glucose, Bld: 106 mg/dL — ABNORMAL HIGH (ref 70–99)
Potassium: 4 mmol/L (ref 3.5–5.1)
Sodium: 134 mmol/L — ABNORMAL LOW (ref 135–145)
Total Bilirubin: 0.9 mg/dL (ref 0.3–1.2)
Total Protein: 6.1 g/dL — ABNORMAL LOW (ref 6.5–8.1)

## 2021-09-04 LAB — BASIC METABOLIC PANEL
Anion gap: 8 (ref 5–15)
BUN: 17 mg/dL (ref 6–20)
CO2: 26 mmol/L (ref 22–32)
Calcium: 8.7 mg/dL — ABNORMAL LOW (ref 8.9–10.3)
Chloride: 102 mmol/L (ref 98–111)
Creatinine, Ser: 0.43 mg/dL — ABNORMAL LOW (ref 0.61–1.24)
GFR, Estimated: >60 mL/min (ref >60)
Glucose, Bld: 103 mg/dL — ABNORMAL HIGH (ref 70–99)
Potassium: 4 mmol/L (ref 3.5–5.1)
Sodium: 136 mmol/L (ref 135–145)

## 2021-09-04 LAB — CBC WITH DIFFERENTIAL/PLATELET
Abs Immature Granulocytes: 0.48 10*3/uL — ABNORMAL HIGH (ref 0.00–0.07)
Basophils Absolute: 0.1 10*3/uL (ref 0.0–0.1)
Basophils Relative: 1 %
Eosinophils Absolute: 0.7 10*3/uL — ABNORMAL HIGH (ref 0.0–0.5)
Eosinophils Relative: 4 %
HCT: 34.9 % — ABNORMAL LOW (ref 39.0–52.0)
Hemoglobin: 11 g/dL — ABNORMAL LOW (ref 13.0–17.0)
Immature Granulocytes: 3 %
Lymphocytes Relative: 16 %
Lymphs Abs: 2.8 10*3/uL (ref 0.7–4.0)
MCH: 29.9 pg (ref 26.0–34.0)
MCHC: 31.5 g/dL (ref 30.0–36.0)
MCV: 94.8 fL (ref 80.0–100.0)
Monocytes Absolute: 1.1 10*3/uL — ABNORMAL HIGH (ref 0.1–1.0)
Monocytes Relative: 6 %
Neutro Abs: 12.5 10*3/uL — ABNORMAL HIGH (ref 1.7–7.7)
Neutrophils Relative %: 70 %
Platelets: 445 10*3/uL — ABNORMAL HIGH (ref 150–400)
RBC: 3.68 MIL/uL — ABNORMAL LOW (ref 4.22–5.81)
RDW: 16.2 % — ABNORMAL HIGH (ref 11.5–15.5)
WBC: 17.7 10*3/uL — ABNORMAL HIGH (ref 4.0–10.5)
nRBC: 0 % (ref 0.0–0.2)

## 2021-09-04 LAB — GLUCOSE, CAPILLARY
Glucose-Capillary: 100 mg/dL — ABNORMAL HIGH (ref 70–99)
Glucose-Capillary: 107 mg/dL — ABNORMAL HIGH (ref 70–99)
Glucose-Capillary: 94 mg/dL (ref 70–99)
Glucose-Capillary: 126 mg/dL — ABNORMAL HIGH (ref 70–99)
Glucose-Capillary: 120 mg/dL — ABNORMAL HIGH (ref 70–99)
Glucose-Capillary: 124 mg/dL — ABNORMAL HIGH (ref 70–99)

## 2021-09-04 SURGERY — IRRIGATION AND DEBRIDEMENT EXTREMITY
Anesthesia: General | Laterality: Right

## 2021-09-04 MED ORDER — FUROSEMIDE 10 MG/ML IJ SOLN
40.0000 mg | Freq: Four times a day (QID) | INTRAMUSCULAR | Status: AC
  Administered 2021-09-04 (×2): 40 mg via INTRAVENOUS
  Filled 2021-09-04 (×2): qty 4

## 2021-09-04 MED ORDER — ONDANSETRON HCL 4 MG/2ML IJ SOLN
INTRAMUSCULAR | Status: DC | PRN
  Administered 2021-09-04: 4 mg via INTRAVENOUS

## 2021-09-04 MED ORDER — DEXTROSE 10 % IV SOLN: INTRAVENOUS | Status: DC

## 2021-09-04 MED ORDER — FENTANYL CITRATE (PF) 100 MCG/2ML IJ SOLN
INTRAMUSCULAR | Status: DC | PRN
Start: 2021-09-04 — End: 2021-09-04
  Administered 2021-09-04: 100 ug via INTRAVENOUS
  Administered 2021-09-04: 50 ug via INTRAVENOUS

## 2021-09-04 MED ORDER — PHENYLEPHRINE 80 MCG/ML (10ML) SYRINGE FOR IV PUSH (FOR BLOOD PRESSURE SUPPORT)
PREFILLED_SYRINGE | INTRAVENOUS | Status: DC | PRN
  Administered 2021-09-04 (×9): 80 ug via INTRAVENOUS

## 2021-09-04 MED ORDER — MIDAZOLAM HCL 2 MG/2ML IJ SOLN
INTRAMUSCULAR | Status: DC | PRN
  Administered 2021-09-04: 2 mg via INTRAVENOUS

## 2021-09-04 MED ORDER — MIDAZOLAM HCL 2 MG/2ML IJ SOLN
INTRAMUSCULAR | Status: AC
  Filled 2021-09-04: qty 2

## 2021-09-04 MED ORDER — PROPOFOL 10 MG/ML IV BOLUS
INTRAVENOUS | Status: AC
  Filled 2021-09-04: qty 20

## 2021-09-04 MED ORDER — 0.9 % SODIUM CHLORIDE (POUR BTL) OPTIME
TOPICAL | Status: DC | PRN
  Administered 2021-09-04: 1000 mL

## 2021-09-04 MED ORDER — SODIUM CHLORIDE 0.9 % IR SOLN
Status: DC | PRN
  Administered 2021-09-04: 3000 mL

## 2021-09-04 MED ORDER — ONDANSETRON HCL 4 MG/2ML IJ SOLN
INTRAMUSCULAR | Status: AC
  Filled 2021-09-04: qty 2

## 2021-09-04 MED ORDER — LACTATED RINGERS IV SOLN: INTRAVENOUS | Status: DC | PRN

## 2021-09-04 MED ORDER — ENOXAPARIN SODIUM 100 MG/ML IJ SOSY
100.0000 mg | PREFILLED_SYRINGE | Freq: Two times a day (BID) | INTRAMUSCULAR | Status: DC
  Administered 2021-09-05 – 2021-09-19 (×28): 100 mg via SUBCUTANEOUS
  Filled 2021-09-04 (×30): qty 1

## 2021-09-04 MED ORDER — SODIUM CHLORIDE 0.9 % IV BOLUS
1000.0000 mL | Freq: Once | INTRAVENOUS | Status: AC
  Administered 2021-09-04: 1000 mL via INTRAVENOUS

## 2021-09-04 MED ORDER — FENTANYL CITRATE (PF) 250 MCG/5ML IJ SOLN
INTRAMUSCULAR | Status: AC
  Filled 2021-09-04: qty 5

## 2021-09-04 MED ORDER — PHENYLEPHRINE 80 MCG/ML (10ML) SYRINGE FOR IV PUSH (FOR BLOOD PRESSURE SUPPORT)
PREFILLED_SYRINGE | INTRAVENOUS | Status: AC
  Filled 2021-09-04: qty 10

## 2021-09-04 SURGICAL SUPPLY — 76 items
BAG COUNTER SPONGE SURGICOUNT (BAG) ×2 IMPLANT
BAG SPNG CNTER NS LX DISP (BAG) ×1
BALL CTTN LRG ABS STRL LF (GAUZE/BANDAGES/DRESSINGS) ×2
BLADE CLIPPER SURG (BLADE) IMPLANT
BNDG CMPR MED 15X6 ELC VLCR LF (GAUZE/BANDAGES/DRESSINGS) ×1
BNDG ELASTIC 6X15 VLCR STRL LF (GAUZE/BANDAGES/DRESSINGS) ×1 IMPLANT
BNDG ELASTIC 6X5.8 VLCR STR LF (GAUZE/BANDAGES/DRESSINGS) ×4 IMPLANT
BRUSH SCRUB EZ PLAIN DRY (MISCELLANEOUS) ×4 IMPLANT
CANISTER SUCT 3000ML PPV (MISCELLANEOUS) IMPLANT
CANISTER WOUNDNEG PRESSURE 500 (CANNISTER) ×1 IMPLANT
COTTONBALL LRG STERILE PKG (GAUZE/BANDAGES/DRESSINGS) ×4 IMPLANT
COVER SURGICAL LIGHT HANDLE (MISCELLANEOUS) ×4 IMPLANT
DERMACARRIERS GRAFT 1 TO 1.5 (DISPOSABLE) ×2
DRAPE DERMATAC (DRAPES) ×3 IMPLANT
DRAPE HALF SHEET 40X57 (DRAPES) ×4 IMPLANT
DRAPE ORTHO SPLIT 77X108 STRL (DRAPES) ×2
DRAPE SURG ORHT 6 SPLT 77X108 (DRAPES) ×1 IMPLANT
DRAPE U-SHAPE 47X51 STRL (DRAPES) ×2 IMPLANT
DRSG CUTIMED SORBACT 7X9 (GAUZE/BANDAGES/DRESSINGS) ×2 IMPLANT
DRSG PAD ABDOMINAL 8X10 ST (GAUZE/BANDAGES/DRESSINGS) ×4 IMPLANT
DRSG VAC ATS LRG SENSATRAC (GAUZE/BANDAGES/DRESSINGS) ×2 IMPLANT
ELECT REM PT RETURN 9FT ADLT (ELECTROSURGICAL)
ELECTRODE REM PT RTRN 9FT ADLT (ELECTROSURGICAL) IMPLANT
GAUZE 4X4 16PLY ~~LOC~~+RFID DBL (SPONGE) ×2 IMPLANT
GAUZE SPONGE 4X4 12PLY STRL (GAUZE/BANDAGES/DRESSINGS) ×4 IMPLANT
GAUZE XEROFORM 5X9 LF (GAUZE/BANDAGES/DRESSINGS) IMPLANT
GLOVE BIO SURGEON STRL SZ7.5 (GLOVE) ×2 IMPLANT
GLOVE BIO SURGEON STRL SZ8 (GLOVE) ×2 IMPLANT
GLOVE BIOGEL PI IND STRL 7.5 (GLOVE) ×1 IMPLANT
GLOVE BIOGEL PI IND STRL 8 (GLOVE) ×1 IMPLANT
GLOVE BIOGEL PI IND STRL 9 (GLOVE) ×1 IMPLANT
GLOVE BIOGEL PI INDICATOR 7.5 (GLOVE) ×2
GLOVE BIOGEL PI INDICATOR 8 (GLOVE) ×2
GLOVE BIOGEL PI INDICATOR 9 (GLOVE) ×2
GLOVE SURG ORTHO LTX SZ7.5 (GLOVE) ×4 IMPLANT
GOWN STRL REUS W/ TWL LRG LVL3 (GOWN DISPOSABLE) ×2 IMPLANT
GOWN STRL REUS W/ TWL XL LVL3 (GOWN DISPOSABLE) ×1 IMPLANT
GOWN STRL REUS W/TWL LRG LVL3 (GOWN DISPOSABLE) ×4
GOWN STRL REUS W/TWL XL LVL3 (GOWN DISPOSABLE) ×2
GRAFT DERMACARRIERS 1 TO 1.5 (DISPOSABLE) ×1 IMPLANT
GRAFT MYRIAD 3 LAYER 10X10 (Graft) ×1 IMPLANT
GRAFT MYRIAD 3 LAYER 10X20 (Graft) ×1 IMPLANT
HANDPIECE INTERPULSE COAX TIP (DISPOSABLE)
KIT BASIN OR (CUSTOM PROCEDURE TRAY) ×2 IMPLANT
KIT TURNOVER KIT B (KITS) ×2 IMPLANT
MANIFOLD NEPTUNE II (INSTRUMENTS) ×2 IMPLANT
MEPILEX BORDER 4 X 4 IN ×2 IMPLANT
NS IRRIG 1000ML POUR BTL (IV SOLUTION) ×2 IMPLANT
PACK GENERAL/GYN (CUSTOM PROCEDURE TRAY) ×2 IMPLANT
PACK ORTHO EXTREMITY (CUSTOM PROCEDURE TRAY) ×2 IMPLANT
PAD ARMBOARD 7.5X6 YLW CONV (MISCELLANEOUS) ×4 IMPLANT
PAD CAST 4YDX4 CTTN HI CHSV (CAST SUPPLIES) ×1 IMPLANT
PADDING CAST COTTON 4X4 STRL (CAST SUPPLIES) ×2
PADDING CAST COTTON 6X4 STRL (CAST SUPPLIES) ×2 IMPLANT
POWDER MYRIAD MORCELLS 1000MG (Miscellaneous) ×1 IMPLANT
SET HNDPC FAN SPRY TIP SCT (DISPOSABLE) IMPLANT
SOL PREP POV-IOD 4OZ 10% (MISCELLANEOUS) ×2 IMPLANT
SOL SCRUB PVP POV-IOD 4OZ 7.5% (MISCELLANEOUS) ×2
SOLUTION SCRB POV-IOD 4OZ 7.5% (MISCELLANEOUS) ×1 IMPLANT
SPONGE T-LAP 18X18 ~~LOC~~+RFID (SPONGE) ×2 IMPLANT
STAPLER VISISTAT (STAPLE) ×2 IMPLANT
STAPLER VISISTAT 35W (STAPLE) ×2 IMPLANT
STOCKINETTE 4X48 STRL (DRAPES) ×2 IMPLANT
STOCKINETTE IMPERVIOUS 9X36 MD (GAUZE/BANDAGES/DRESSINGS) IMPLANT
STOCKINETTE IMPERVIOUS LG (DRAPES) IMPLANT
SURGILUBE 2OZ TUBE FLIPTOP (MISCELLANEOUS) ×1 IMPLANT
SUT ETHILON 2 0 PSLX (SUTURE) ×2 IMPLANT
SUT ETHILON 3 0 FSL (SUTURE) IMPLANT
SUT PDS AB 2-0 CT1 27 (SUTURE) IMPLANT
TOWEL GREEN STERILE (TOWEL DISPOSABLE) ×4 IMPLANT
TOWEL GREEN STERILE FF (TOWEL DISPOSABLE) ×2 IMPLANT
TUBE CONNECTING 12X1/4 (SUCTIONS) ×2 IMPLANT
UNDERPAD 30X36 HEAVY ABSORB (UNDERPADS AND DIAPERS) ×2 IMPLANT
VAC 500 ML CANISTER ×1 IMPLANT
WATER STERILE IRR 1000ML POUR (IV SOLUTION) ×2 IMPLANT
YANKAUER SUCT BULB TIP NO VENT (SUCTIONS) ×2 IMPLANT

## 2021-09-04 NOTE — Progress Notes (Signed)
eLink Physician-Brief Progress Note Patient Name: Jeff [name] DOB: Jan 30, 2000 MRN: [id number]   Date of Service  09/04/2021  HPI/Events of Note  Patient on full dose Lovenox for UE and LE DVT's and is scheduled for surgery this morning.   eICU Interventions  Plan: Hold AM dose of Lovenox this morning prior to OR.  Pharmacy notified and they will reschedule Low MW heparin level ordered for 9 AM.        Lenell Antu 09/04/2021, 4:33 AM

## 2021-09-04 NOTE — Progress Notes (Signed)
Pt was placed on trach collar 10L/40%. Pt is tolerating well at this time. RT will monitor.
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
## 2021-09-04 NOTE — Progress Notes (Signed)
eLink Physician-Brief Progress Note Patient Name: Jeff [name] DOB: 09-18-2000 MRN: [id number]   Date of Service  09/04/2021  HPI/Events of Note  Nursing found patient lying sideways in the bed and had vomited a large amount. Tube feeds placed on hold. Last blood glucose = 120. Patient is on Levemir + Q 4 hour resistant Novolog SSI + Q 4 hour tube feed coverage. Question of aspiration.   eICU Interventions  Plan: D10W IV infusion at 40 mL/hour. D/C Tube feed coverage. Portable CXR STAT.      Intervention Category Major Interventions: Other:  Lenell Antu 09/04/2021, 10:53 PM

## 2021-09-04 NOTE — Transfer of Care (Signed)
Immediate Anesthesia Transfer of Care Note  Patient: Jeff [name]  Procedure(s) Performed: IRRIGATION AND DEBRIDEMENT EXTREMITY (Right)  Patient Location: ICU  Anesthesia Type:General  Level of Consciousness: unresponsive and Patient remains intubated per anesthesia plan  Airway & Oxygen Therapy: Patient remains intubated per anesthesia plan and Patient placed on Ventilator (see vital sign flow sheet for setting)  Post-op Assessment: Report given to RN, Post -op Vital signs reviewed and stable and Patient moving all extremities  Post vital signs: Reviewed and stable  Last Vitals:  Vitals Value Taken Time  BP 96/41 09/04/21 1114  Temp    Pulse 77 09/04/21 1114  Resp 18 09/04/21 1114  SpO2 95 % 09/04/21 1114    Last Pain:  Vitals:   09/04/21 0800  TempSrc: Axillary  PainSc:       Patients Stated Pain Goal: 0 (08/30/21 2141)  Complications: No notable events documented.

## 2021-09-04 NOTE — Progress Notes (Signed)
Hold lovenox dose tonight and resume in AM per Dr. Kendrick Fries and [place].  Ulyses Southward, PharmD, BCIDP, AAHIVP, CPP Infectious Disease Pharmacist 09/04/2021 3:48 PM

## 2021-09-04 NOTE — Progress Notes (Signed)
Plan today for repeat irrigation debridement right thigh with possible split-thickness skin grafting and wound VAC application, stress evaluation right hip with fluoroscopy, removal of traction pin right tibia, removal of k-wires right 1st metacarpal, removal of sutures from left arm and right arm.  The risks and benefits of surgery were discussed with patient's sister, including the possibility of infection, nerve injury, vessel injury, wound breakdown, arthritis, symptomatic hardware, DVT/ PE, loss of motion, malunion, nonunion, and need for further surgery among others.  She acknowledged these risks and provided consent to proceed.   Myrene Galas, MD [facility], [facility] [phone]

## 2021-09-04 NOTE — Progress Notes (Signed)
[facility]  Discussed findings from OR with Dr. Marcelino Scot. Pins in thumb remain, were not removed Degloving wound had small hematoma, myriad grafting tissue in place Wound vac is to stay in place for a week CXR showed no pneumothorax, will order chest tube removal  Roselie Awkward, MD [facility] Pager: [phone] Cell: [phone] After 7:00 pm call Elink  [phone]

## 2021-09-05 ENCOUNTER — Encounter (HOSPITAL_COMMUNITY): Payer: Self-pay | Admitting: Orthopedic Surgery

## 2021-09-05 DIAGNOSIS — T07XXXA Unspecified multiple injuries, initial encounter: Secondary | ICD-10-CM | POA: Diagnosis not present

## 2021-09-05 DIAGNOSIS — R451 Restlessness and agitation: Secondary | ICD-10-CM | POA: Diagnosis not present

## 2021-09-05 DIAGNOSIS — J9601 Acute respiratory failure with hypoxia: Secondary | ICD-10-CM | POA: Diagnosis not present

## 2021-09-05 LAB — VITAMIN D 25 HYDROXY (VIT D DEFICIENCY, FRACTURES): Vit D, 25-Hydroxy: 25.12 ng/mL — ABNORMAL LOW (ref 30–100)

## 2021-09-05 LAB — COMPREHENSIVE METABOLIC PANEL
ALT: 147 U/L — ABNORMAL HIGH (ref 0–44)
AST: 35 U/L (ref 15–41)
Albumin: 2.8 g/dL — ABNORMAL LOW (ref 3.5–5.0)
Alkaline Phosphatase: 220 U/L — ABNORMAL HIGH (ref 38–126)
Anion gap: 7 (ref 5–15)
BUN: 28 mg/dL — ABNORMAL HIGH (ref 6–20)
CO2: 28 mmol/L (ref 22–32)
Calcium: 8.9 mg/dL (ref 8.9–10.3)
Chloride: 102 mmol/L (ref 98–111)
Creatinine, Ser: 0.54 mg/dL — ABNORMAL LOW (ref 0.61–1.24)
GFR, Estimated: >60 mL/min (ref >60)
Glucose, Bld: 114 mg/dL — ABNORMAL HIGH (ref 70–99)
Potassium: 4 mmol/L (ref 3.5–5.1)
Sodium: 137 mmol/L (ref 135–145)
Total Bilirubin: 0.6 mg/dL (ref 0.3–1.2)
Total Protein: 6.6 g/dL (ref 6.5–8.1)

## 2021-09-05 LAB — GLUCOSE, CAPILLARY
Glucose-Capillary: 112 mg/dL — ABNORMAL HIGH (ref 70–99)
Glucose-Capillary: 125 mg/dL — ABNORMAL HIGH (ref 70–99)
Glucose-Capillary: 116 mg/dL — ABNORMAL HIGH (ref 70–99)
Glucose-Capillary: 115 mg/dL — ABNORMAL HIGH (ref 70–99)
Glucose-Capillary: 133 mg/dL — ABNORMAL HIGH (ref 70–99)
Glucose-Capillary: 125 mg/dL — ABNORMAL HIGH (ref 70–99)

## 2021-09-05 LAB — CBC
HCT: 37.4 % — ABNORMAL LOW (ref 39.0–52.0)
Hemoglobin: 11.5 g/dL — ABNORMAL LOW (ref 13.0–17.0)
MCH: 29.8 pg (ref 26.0–34.0)
MCHC: 30.7 g/dL (ref 30.0–36.0)
MCV: 96.9 fL (ref 80.0–100.0)
Platelets: 394 10*3/uL (ref 150–400)
RBC: 3.86 MIL/uL — ABNORMAL LOW (ref 4.22–5.81)
RDW: 16.4 % — ABNORMAL HIGH (ref 11.5–15.5)
WBC: 12.1 10*3/uL — ABNORMAL HIGH (ref 4.0–10.5)
nRBC: 0 % (ref 0.0–0.2)

## 2021-09-05 MED ORDER — SODIUM CHLORIDE 0.9 % IV SOLN
3.0000 g | Freq: Three times a day (TID) | INTRAVENOUS | Status: DC
  Administered 2021-09-05 – 2021-09-08 (×9): 3 g via INTRAVENOUS
  Filled 2021-09-05 (×9): qty 8

## 2021-09-05 MED ORDER — CLONIDINE HCL 0.2 MG/24HR TD PTWK: 0.2000 mg | MEDICATED_PATCH | TRANSDERMAL | Status: DC

## 2021-09-05 MED ORDER — CARBAMAZEPINE 200 MG PO TABS
100.0000 mg | ORAL_TABLET | Freq: Every day | ORAL | Status: DC
Start: 2021-09-05 — End: 2021-09-07
  Administered 2021-09-05 – 2021-09-06 (×2): 100 mg
  Filled 2021-09-05 (×2): qty 0.5

## 2021-09-05 MED ORDER — CLONIDINE HCL 0.1 MG/24HR TD PTWK
0.1000 mg | MEDICATED_PATCH | TRANSDERMAL | Status: DC
  Administered 2021-09-06 – 2021-09-13 (×2): 0.1 mg via TRANSDERMAL
  Filled 2021-09-05 (×2): qty 1

## 2021-09-05 MED ORDER — ACETAMINOPHEN 500 MG PO TABS
1000.0000 mg | ORAL_TABLET | Freq: Three times a day (TID) | ORAL | Status: DC
Start: 2021-09-05 — End: 2021-09-07
  Administered 2021-09-05 – 2021-09-07 (×6): 1000 mg
  Filled 2021-09-05 (×6): qty 2

## 2021-09-05 MED ORDER — INSULIN ASPART 100 UNIT/ML IJ SOLN
0.0000 [IU] | INTRAMUSCULAR | Status: DC
  Administered 2021-09-05 – 2021-09-12 (×26): 2 [IU] via SUBCUTANEOUS
  Administered 2021-09-12: 3 [IU] via SUBCUTANEOUS
  Administered 2021-09-12 – 2021-09-15 (×7): 2 [IU] via SUBCUTANEOUS
  Administered 2021-09-15 (×2): 3 [IU] via SUBCUTANEOUS

## 2021-09-05 MED ORDER — FUROSEMIDE 10 MG/ML IJ SOLN
40.0000 mg | Freq: Once | INTRAMUSCULAR | Status: AC
  Administered 2021-09-05: 40 mg via INTRAVENOUS
  Filled 2021-09-05: qty 4

## 2021-09-05 MED ORDER — CLONIDINE HCL 0.3 MG/24HR TD PTWK: 0.3000 mg | MEDICATED_PATCH | TRANSDERMAL | Status: DC

## 2021-09-05 NOTE — Anesthesia Postprocedure Evaluation (Signed)
Anesthesia Post Note  Patient: Jeff [name]  Procedure(s) Performed: IRRIGATION AND DEBRIDEMENT EXTREMITY (Right)     Patient location during evaluation: PACU Anesthesia Type: General Level of consciousness: sedated and patient cooperative Pain management: pain level controlled Vital Signs Assessment: post-procedure vital signs reviewed and stable Respiratory status: spontaneous breathing Cardiovascular status: stable Anesthetic complications: no   No notable events documented.  Last Vitals:  Vitals:   09/05/21 1400 09/05/21 1500  BP: (!) 113/54 125/70  Pulse: 94 (!) 118  Resp: 17 (!) 22  Temp:    SpO2: 98% 96%    Last Pain:  Vitals:   09/05/21 1200  TempSrc: Axillary  PainSc:                  Lewie Loron

## 2021-09-05 NOTE — Progress Notes (Signed)
Inpatient Rehabilitation Admissions Coordinator   Will not place rehab consult at this time. Plans to return to OR on 8/17. Await further progress/tolerance with therapies.  Ottie Glazier, RN, MSN Rehab Admissions Coordinator [phone] 09/05/2021 12:27 PM

## 2021-09-05 NOTE — Progress Notes (Signed)
Orthopedic Tech Progress Note Patient Details:  Chidubem Chaires Aug 08, 2000 [id number] Called in order to [employer] for custom AFO Patient ID: Bryndon Cumbie, male   DOB: 10/03/2000, 21 y.o.   MRN: [id number]  [name] 09/05/2021, 12:02 PM
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
## 2021-09-05 NOTE — Progress Notes (Signed)
eLink Physician-Brief Progress Note Patient Name: Isabel Freese DOB: January 28, 2000 MRN: [id number]   Date of Service  09/05/2021  HPI/Events of Note  Patient has a right hip fracture and orthopedics wants his right lower extremity immobilized  but patient keeps moving it.  eICU Interventions  Right lower extremity soft restraint ordered.        Migdalia Dk 09/05/2021, 9:46 PM
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
## 2021-09-05 NOTE — Progress Notes (Signed)
Pharmacy Antibiotic Note  Jeff [name] is a 21 y.o. male admitted on 08/05/2021 with pneumonia.  Pharmacy has been consulted for Unasyn dosing.  Plan: Unasyn 3G Q8H.   Height: 5' 7.72" (172 cm) Weight: 85.1 kg (187 lb 9.8 oz) IBW/kg (Calculated) : 67.75  Temp (24hrs), Avg:99.4 F (37.4 C), Min:98.2 F (36.8 C), Max:100.9 F (38.3 C)  Recent Labs  Lab 08/30/21 0605 09/01/21 0745 09/01/21 0827 09/02/21 0433 09/03/21 0620 09/04/21 0500 09/04/21 1218 09/05/21 0316  WBC 11.5* 11.2*  --   --  13.6* 17.7*  --  12.1*  CREATININE 0.61  --    < > 0.40* 0.42* 0.44* 0.43* 0.54*   < > = values in this interval not displayed.    Estimated Creatinine Clearance: 155.6 mL/min (A) (by C-G formula based on SCr of 0.54 mg/dL (L)).    Allergies  Allergen Reactions   Whole Blood     Patient is Jehovah's Witness    Thank you for allowing pharmacy to be a part of this patient's care.  Francena Hanly 09/05/2021 12:10 PM
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
## 2021-09-06 DIAGNOSIS — J9621 Acute and chronic respiratory failure with hypoxia: Secondary | ICD-10-CM | POA: Diagnosis not present

## 2021-09-06 DIAGNOSIS — J9622 Acute and chronic respiratory failure with hypercapnia: Secondary | ICD-10-CM | POA: Diagnosis not present

## 2021-09-06 DIAGNOSIS — T07XXXA Unspecified multiple injuries, initial encounter: Secondary | ICD-10-CM | POA: Diagnosis not present

## 2021-09-06 LAB — GLUCOSE, CAPILLARY
Glucose-Capillary: 113 mg/dL — ABNORMAL HIGH (ref 70–99)
Glucose-Capillary: 132 mg/dL — ABNORMAL HIGH (ref 70–99)
Glucose-Capillary: 119 mg/dL — ABNORMAL HIGH (ref 70–99)
Glucose-Capillary: 112 mg/dL — ABNORMAL HIGH (ref 70–99)
Glucose-Capillary: 124 mg/dL — ABNORMAL HIGH (ref 70–99)
Glucose-Capillary: 125 mg/dL — ABNORMAL HIGH (ref 70–99)

## 2021-09-06 MED ORDER — ORAL CARE MOUTH RINSE: 15.0000 mL | OROMUCOSAL | Status: DC | PRN

## 2021-09-06 MED ORDER — ORAL CARE MOUTH RINSE
15.0000 mL | OROMUCOSAL | Status: DC
  Administered 2021-09-06 – 2021-09-19 (×47): 15 mL via OROMUCOSAL

## 2021-09-06 MED ORDER — VITAMIN D 25 MCG (1000 UNIT) PO TABS
1000.0000 [IU] | ORAL_TABLET | Freq: Every day | ORAL | Status: DC
  Administered 2021-09-06 – 2021-09-15 (×9): 1000 [IU]
  Filled 2021-09-06 (×9): qty 1
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
## 2021-09-06 NOTE — Progress Notes (Addendum)
Brief Nutrition Follow-up:  Received page from RN requesting titration orders for TF. MD requesting TF to be restarted slowly.   Noted episode of emesis on 8/10, TF has been on hold since then. On 8/10 during the evening, pt found lying sideways in bed and had vomited a large amount. Unsure what vomit looked like-TF vs Bile, etc. Cortrak tube at duodenal-jejunal junction. Not likely TF that was vomited, pt may have delayed gastric emptying contributing to emesis of gastric contents  No abdominal xray since episode of emesis. If pt did vomit actually TF, recommend abd xray to reassess Cortrak tube tip location  Labs reviewed; CBGs acceptable. No BMP today  Wound VAC with 225 mL in 24 hours  Rectal tube with 300 mL in 24 hours while TF on hold. 500 mL charted the previous 24 hours. Noted 800 mL thus far this shift  Noted pt with several meds ordered per tube as suspensions; these may be contributing to increased stool, especially if they can sorbitol.   Interventions:  Initiate Vital 1.5 at 20 ml/hr; titrate by 10 mL q 8 hours until goal rate of 65 ml/hr Continue Pro-Source  Juven held on 8/11 by RD in effort to see if this helps improve diarrhea; follow-up and reassess Recommend evaluating all of the meds that are ordered as a suspension to see if this could be culprits re: diarrhea Plan to trial Banatrol (new to [employer]) once available next week  Romelle Starcher MS, RDN, LDN, CNSC Registered Dietitian 3 Clinical Nutrition RD Pager and On-Call Pager Number Located in [place]

## 2021-09-07 DIAGNOSIS — J9621 Acute and chronic respiratory failure with hypoxia: Secondary | ICD-10-CM | POA: Diagnosis not present

## 2021-09-07 DIAGNOSIS — J9622 Acute and chronic respiratory failure with hypercapnia: Secondary | ICD-10-CM | POA: Diagnosis not present

## 2021-09-07 DIAGNOSIS — T07XXXA Unspecified multiple injuries, initial encounter: Secondary | ICD-10-CM | POA: Diagnosis not present

## 2021-09-07 LAB — BASIC METABOLIC PANEL
Anion gap: 7 (ref 5–15)
BUN: 17 mg/dL (ref 6–20)
CO2: 27 mmol/L (ref 22–32)
Calcium: 8.9 mg/dL (ref 8.9–10.3)
Chloride: 104 mmol/L (ref 98–111)
Creatinine, Ser: 0.5 mg/dL — ABNORMAL LOW (ref 0.61–1.24)
GFR, Estimated: >60 mL/min (ref >60)
Glucose, Bld: 130 mg/dL — ABNORMAL HIGH (ref 70–99)
Potassium: 3.2 mmol/L — ABNORMAL LOW (ref 3.5–5.1)
Sodium: 138 mmol/L (ref 135–145)

## 2021-09-07 LAB — GLUCOSE, CAPILLARY
Glucose-Capillary: 139 mg/dL — ABNORMAL HIGH (ref 70–99)
Glucose-Capillary: 119 mg/dL — ABNORMAL HIGH (ref 70–99)
Glucose-Capillary: 118 mg/dL — ABNORMAL HIGH (ref 70–99)
Glucose-Capillary: 122 mg/dL — ABNORMAL HIGH (ref 70–99)
Glucose-Capillary: 137 mg/dL — ABNORMAL HIGH (ref 70–99)
Glucose-Capillary: 125 mg/dL — ABNORMAL HIGH (ref 70–99)

## 2021-09-07 LAB — CBC
HCT: 35.8 % — ABNORMAL LOW (ref 39.0–52.0)
Hemoglobin: 11.4 g/dL — ABNORMAL LOW (ref 13.0–17.0)
MCH: 30.5 pg (ref 26.0–34.0)
MCHC: 31.8 g/dL (ref 30.0–36.0)
MCV: 95.7 fL (ref 80.0–100.0)
Platelets: 408 10*3/uL — ABNORMAL HIGH (ref 150–400)
RBC: 3.74 MIL/uL — ABNORMAL LOW (ref 4.22–5.81)
RDW: 15.3 % (ref 11.5–15.5)
WBC: 16.8 10*3/uL — ABNORMAL HIGH (ref 4.0–10.5)
nRBC: 0 % (ref 0.0–0.2)

## 2021-09-07 LAB — MAGNESIUM: Magnesium: 2.2 mg/dL (ref 1.7–2.4)

## 2021-09-07 LAB — HEPARIN ANTI-XA: Heparin LMW: 0.7 IU/mL

## 2021-09-07 MED ORDER — AMANTADINE HCL 50 MG/5ML PO SOLN
50.0000 mg | Freq: Every day | ORAL | Status: DC
Start: 2021-09-07 — End: 2021-09-08
  Administered 2021-09-07 – 2021-09-08 (×2): 50 mg
  Filled 2021-09-07 (×2): qty 5

## 2021-09-07 MED ORDER — POTASSIUM CHLORIDE 20 MEQ PO PACK
20.0000 meq | PACK | ORAL | Status: AC
  Administered 2021-09-07 (×2): 20 meq
  Filled 2021-09-07 (×2): qty 1

## 2021-09-07 MED ORDER — METHOCARBAMOL 500 MG PO TABS
500.0000 mg | ORAL_TABLET | Freq: Three times a day (TID) | ORAL | Status: DC
  Administered 2021-09-07 – 2021-09-09 (×7): 500 mg
  Filled 2021-09-07 (×7): qty 1

## 2021-09-07 MED ORDER — ACETAMINOPHEN 500 MG PO TABS
500.0000 mg | ORAL_TABLET | Freq: Three times a day (TID) | ORAL | Status: DC
  Administered 2021-09-07 – 2021-09-15 (×24): 500 mg
  Filled 2021-09-07 (×23): qty 1

## 2021-09-07 MED ORDER — POTASSIUM CHLORIDE 10 MEQ/50ML IV SOLN
10.0000 meq | INTRAVENOUS | Status: AC
  Administered 2021-09-07 (×4): 10 meq via INTRAVENOUS
  Filled 2021-09-07 (×4): qty 50
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
## 2021-09-07 NOTE — Progress Notes (Signed)
[facility] ADULT ICU REPLACEMENT PROTOCOL   The patient does apply for the [facility] Adult ICU Electrolyte Replacment Protocol based on the criteria listed below:   1.Exclusion criteria: TCTS patients, ECMO patients, and Dialysis patients 2. Is GFR >/= 30 ml/min? Yes.    Patient's GFR today is >60 3. Is SCr </= 2? Yes.   Patient's SCr is 0.50 mg/dL 4. Did SCr increase >/= 0.5 in 24 hours? No. 5.Pt's weight >40kg  Yes.   6. Abnormal electrolyte(s): K  7. Electrolytes replaced per protocol 8.  Call MD STAT for K+ </= 2.5, Phos </= 1, or Mag </= 1 Physician:  Alpha Gula [facility] 09/07/2021 6:21 AM

## 2021-09-07 NOTE — Progress Notes (Signed)
eLink Physician-Brief Progress Note Patient Name: Daryle Amis DOB: 2000/07/08 MRN: [id number]   Date of Service  09/07/2021  HPI/Events of Note  Received request for renewal of right ankle restraints Patient seen restless and a risk for self harm.  eICU Interventions  Right ankle restraint renewed Bedside team to assess in am if restraints to be continued      Intervention Category Minor Interventions: Agitation / anxiety - evaluation and management  Darl Pikes 09/07/2021, 11:29 PM
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
## 2021-09-08 ENCOUNTER — Inpatient Hospital Stay (HOSPITAL_COMMUNITY): Payer: Medicaid Other

## 2021-09-08 DIAGNOSIS — S299XXA Unspecified injury of thorax, initial encounter: Secondary | ICD-10-CM | POA: Diagnosis not present

## 2021-09-08 LAB — BASIC METABOLIC PANEL
Anion gap: 9 (ref 5–15)
BUN: 13 mg/dL (ref 6–20)
CO2: 25 mmol/L (ref 22–32)
Calcium: 8.6 mg/dL — ABNORMAL LOW (ref 8.9–10.3)
Chloride: 103 mmol/L (ref 98–111)
Creatinine, Ser: 0.38 mg/dL — ABNORMAL LOW (ref 0.61–1.24)
GFR, Estimated: >60 mL/min (ref >60)
Glucose, Bld: 120 mg/dL — ABNORMAL HIGH (ref 70–99)
Potassium: 3.6 mmol/L (ref 3.5–5.1)
Sodium: 137 mmol/L (ref 135–145)

## 2021-09-08 LAB — GLUCOSE, CAPILLARY
Glucose-Capillary: 100 mg/dL — ABNORMAL HIGH (ref 70–99)
Glucose-Capillary: 101 mg/dL — ABNORMAL HIGH (ref 70–99)
Glucose-Capillary: 110 mg/dL — ABNORMAL HIGH (ref 70–99)
Glucose-Capillary: 125 mg/dL — ABNORMAL HIGH (ref 70–99)
Glucose-Capillary: 129 mg/dL — ABNORMAL HIGH (ref 70–99)
Glucose-Capillary: 136 mg/dL — ABNORMAL HIGH (ref 70–99)

## 2021-09-08 LAB — MAGNESIUM: Magnesium: 2.2 mg/dL (ref 1.7–2.4)

## 2021-09-08 LAB — PHOSPHORUS: Phosphorus: 3.7 mg/dL (ref 2.5–4.6)

## 2021-09-08 MED ORDER — GABAPENTIN 250 MG/5ML PO SOLN
400.0000 mg | Freq: Three times a day (TID) | ORAL | Status: DC
Start: 2021-09-08 — End: 2021-09-15
  Administered 2021-09-08 – 2021-09-15 (×22): 400 mg
  Filled 2021-09-08 (×25): qty 8

## 2021-09-08 MED ORDER — AMANTADINE HCL 100 MG PO CAPS
100.0000 mg | ORAL_CAPSULE | Freq: Two times a day (BID) | ORAL | Status: DC
  Administered 2021-09-08 – 2021-09-15 (×13): 100 mg
  Filled 2021-09-08 (×16): qty 1

## 2021-09-08 MED ORDER — GABAPENTIN 800 MG PO TABS
400.0000 mg | ORAL_TABLET | Freq: Three times a day (TID) | ORAL | Status: DC
  Filled 2021-09-08: qty 0.5

## 2021-09-08 MED ORDER — PROSOURCE TF20 ENFIT COMPATIBL EN LIQD
60.0000 mL | Freq: Four times a day (QID) | ENTERAL | Status: DC
Start: 2021-09-08 — End: 2021-09-12
  Administered 2021-09-08 – 2021-09-12 (×15): 60 mL
  Filled 2021-09-08 (×14): qty 60

## 2021-09-08 MED ORDER — PROPRANOLOL HCL 40 MG PO TABS
40.0000 mg | ORAL_TABLET | Freq: Three times a day (TID) | ORAL | Status: DC
  Administered 2021-09-08 – 2021-09-15 (×20): 40 mg
  Filled 2021-09-08 (×24): qty 1

## 2021-09-08 MED ORDER — POTASSIUM CHLORIDE 20 MEQ PO PACK
40.0000 meq | PACK | Freq: Once | ORAL | Status: AC
Start: 2021-09-08 — End: 2021-09-08
  Administered 2021-09-08: 40 meq
  Filled 2021-09-08: qty 2

## 2021-09-08 NOTE — Consult Note (Signed)
Brief Psychiatry Consult Note   Date of service: September 08, 2021 Patient Name: Jeff [name] DOB: 10-Mar-2000 MRN: [id number] Reason for consult: "Agitation and ICU Delirium" Requesting Provider: Lynnell Catalan, MD  Psychiatry team, including medical students and attending physician, approached patient today for assessment with patient's sister at bedside. Exam was limited by multiple factors including patient's limited ability to participate as he had recently received multiple doses of pain medication. Further, upon initiation of attempted assessment patient declined further questions by shaking his head "no" repeatedly.   Psychiatry team will continue to follow this patient with plant to re-assess tomorrow (8/15).  Signed: Reinaldo Meeker, Medical Student MS4, [facility] 09/08/2021, 1:53 PM

## 2021-09-08 NOTE — Progress Notes (Signed)
[facility] ADULT ICU REPLACEMENT PROTOCOL   The patient does apply for the [facility] Adult ICU Electrolyte Replacment Protocol based on the criteria listed below:   1.Exclusion criteria: TCTS patients, ECMO patients, and Dialysis patients 2. Is GFR >/= 30 ml/min? Yes.    Patient's GFR today is >60 3. Is SCr </= 2? Yes.   Patient's SCr is 0.38 mg/dL 4. Did SCr increase >/= 0.5 in 24 hours? No. 5.Pt's weight >40kg  Yes.   6. Abnormal electrolyte(s): K  7. Electrolytes replaced per protocol 8.  Call MD STAT for K+ </= 2.5, Phos </= 1, or Mag </= 1 Physician:  Forbes Cellar [facility] 09/08/2021 5:26 AM

## 2021-09-08 NOTE — Progress Notes (Signed)
Cortrak Tube Team Note:  Consult received to reposition tube to gastric position. Cortrak tube currently in SB and clipped at 85 cm  Cortrak RD pulled back on tube, traced and tube tip left close to pylorus per Cortrak reading. Bridle retained and Cortrak clipped at 62 cm.  X-ray is required, abdominal x-ray has been ordered by the Cortrak team. Please confirm tube placement before using the Cortrak tube.   If the tube becomes dislodged please keep the tube and contact the Cortrak team at [email or web address] (password [email or web address]) for replacement.  If after hours and replacement cannot be delayed, place a NG tube and confirm placement with an abdominal x-ray.    Romelle Starcher MS, RDN, LDN, CNSC Registered Dietitian 3 Clinical Nutrition RD Pager and On-Call Pager Number Located in [place]
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
## 2021-09-08 NOTE — Progress Notes (Signed)
Pt has order to D/C PICC line. Ultrasound to both Lower extremities , visualized good vein Lt lower FA but RN informed me that OT came by today and fitted patient with bilat lower FA splints. There is no where to place an ultrasound IV since it cannot be under the splints. RN aware. Communicated this to MD.
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
## 2021-09-08 NOTE — Progress Notes (Signed)
Orthopedic Tech Progress Note Patient Details:  Jeff [name] Sep 05, 2000 [id number]  Ortho Devices Type of Ortho Device: Velcro wrist splint Ortho Device/Splint Location: lue Ortho Device/Splint Interventions: Ordered, Application   Post Interventions Patient Tolerated: Well Instructions Provided: Care of device, Adjustment of device  [name] 09/08/2021, 8:38 PM
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
## 2021-09-08 NOTE — Procedures (Signed)
Tracheostomy Change Note  Patient Details:   Name: Kaiser Belluomini DOB: 2000-03-19 MRN: [id number]    Airway Documentation:     Evaluation  O2 sats: stable throughout Complications: No apparent complications Patient did tolerate procedure well. Bilateral Breath Sounds: Clear, Diminished  Positive color change on ETCO2 detector.    Rayburn Felt 09/08/2021, 12:58 PM
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
## 2021-09-09 DIAGNOSIS — S299XXA Unspecified injury of thorax, initial encounter: Secondary | ICD-10-CM | POA: Diagnosis not present

## 2021-09-09 LAB — GLUCOSE, CAPILLARY
Glucose-Capillary: 116 mg/dL — ABNORMAL HIGH (ref 70–99)
Glucose-Capillary: 122 mg/dL — ABNORMAL HIGH (ref 70–99)
Glucose-Capillary: 126 mg/dL — ABNORMAL HIGH (ref 70–99)
Glucose-Capillary: 128 mg/dL — ABNORMAL HIGH (ref 70–99)
Glucose-Capillary: 135 mg/dL — ABNORMAL HIGH (ref 70–99)
Glucose-Capillary: 150 mg/dL — ABNORMAL HIGH (ref 70–99)

## 2021-09-09 MED ORDER — SERTRALINE HCL 50 MG PO TABS
50.0000 mg | ORAL_TABLET | Freq: Every day | ORAL | Status: DC
  Administered 2021-09-09 – 2021-09-14 (×6): 50 mg
  Filled 2021-09-09 (×6): qty 1

## 2021-09-09 MED ORDER — TRAMADOL HCL 50 MG PO TABS
50.0000 mg | ORAL_TABLET | Freq: Four times a day (QID) | ORAL | Status: DC | PRN
  Administered 2021-09-09 – 2021-09-19 (×10): 50 mg
  Filled 2021-09-09 (×11): qty 1
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
## 2021-09-10 DIAGNOSIS — G9341 Metabolic encephalopathy: Secondary | ICD-10-CM | POA: Diagnosis not present

## 2021-09-10 DIAGNOSIS — J9601 Acute respiratory failure with hypoxia: Secondary | ICD-10-CM | POA: Diagnosis not present

## 2021-09-10 DIAGNOSIS — T07XXXA Unspecified multiple injuries, initial encounter: Secondary | ICD-10-CM | POA: Diagnosis not present

## 2021-09-10 DIAGNOSIS — J9621 Acute and chronic respiratory failure with hypoxia: Secondary | ICD-10-CM | POA: Diagnosis not present

## 2021-09-10 LAB — BASIC METABOLIC PANEL
Anion gap: 7 (ref 5–15)
BUN: 18 mg/dL (ref 6–20)
CO2: 29 mmol/L (ref 22–32)
Calcium: 9.1 mg/dL (ref 8.9–10.3)
Chloride: 102 mmol/L (ref 98–111)
Creatinine, Ser: 0.58 mg/dL — ABNORMAL LOW (ref 0.61–1.24)
GFR, Estimated: >60 mL/min (ref >60)
Glucose, Bld: 126 mg/dL — ABNORMAL HIGH (ref 70–99)
Potassium: 4 mmol/L (ref 3.5–5.1)
Sodium: 138 mmol/L (ref 135–145)

## 2021-09-10 LAB — GLUCOSE, CAPILLARY
Glucose-Capillary: 117 mg/dL — ABNORMAL HIGH (ref 70–99)
Glucose-Capillary: 125 mg/dL — ABNORMAL HIGH (ref 70–99)
Glucose-Capillary: 131 mg/dL — ABNORMAL HIGH (ref 70–99)
Glucose-Capillary: 133 mg/dL — ABNORMAL HIGH (ref 70–99)
Glucose-Capillary: 138 mg/dL — ABNORMAL HIGH (ref 70–99)
Glucose-Capillary: 139 mg/dL — ABNORMAL HIGH (ref 70–99)

## 2021-09-10 LAB — CBC
HCT: 34.8 % — ABNORMAL LOW (ref 39.0–52.0)
Hemoglobin: 11.4 g/dL — ABNORMAL LOW (ref 13.0–17.0)
MCH: 30.5 pg (ref 26.0–34.0)
MCHC: 32.8 g/dL (ref 30.0–36.0)
MCV: 93 fL (ref 80.0–100.0)
Platelets: 373 10*3/uL (ref 150–400)
RBC: 3.74 MIL/uL — ABNORMAL LOW (ref 4.22–5.81)
RDW: 14.7 % (ref 11.5–15.5)
WBC: 14.6 10*3/uL — ABNORMAL HIGH (ref 4.0–10.5)
nRBC: 0 % (ref 0.0–0.2)

## 2021-09-10 MED ORDER — CEFAZOLIN SODIUM-DEXTROSE 2-4 GM/100ML-% IV SOLN
2.0000 g | INTRAVENOUS | Status: AC
  Filled 2021-09-10: qty 100

## 2021-09-10 NOTE — Progress Notes (Signed)
OT NOTE  RN STAFF  Maximum wear time is 2 hours with 1 hour following nothing on the L UE as  a "rest time"   Education provide to sister in room and additional straps left with sister.   Splint can be cleaned with warm soapy water and alcohol swab. Splint should not be placed in heat of any kind because the splint with mold into a new shape.     Media Information     Timmothy Euler, OTR/L  Acute Rehabilitation Services Office: [phone] .
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
## 2021-09-10 NOTE — Progress Notes (Signed)
OT NOTE  R UE Thumb spica splint  Please be mindful of thumb external pins when applying splint      RN STAFF  Please check splint every 4 hours during shift ( remove splint , remove stockinette/ dressing present) to assess for: * pain * redness *swelling  If any symptoms above present remove splint for 15 minutes. If symptoms continue - keep the splint removed and notify OT staff [phone] immediately.   Keep the UE elevated at all times on pillows / towels.  Splint can be cleaned with warm soapy water and alcohol swab. Splint should not be placed in heat of any kind because the splint with mold into a new shape.    Timmothy Euler, OTR/L  Acute Rehabilitation Services Office: [phone] .
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
## 2021-09-10 NOTE — Progress Notes (Signed)
OT noTe  Splint check completed and no concerns noted. Sister was able to doff splint and educated on skin inspections.  Handout provided for visual aide to properly fit splint.  Answering questions about wear schedule at this time    09/10/21 1100  OT Time Calculation  OT Start Time (ACUTE ONLY) 1120  OT Stop Time (ACUTE ONLY) 1130  OT Time Calculation (min) 10 min  OT General Charges  $OT Visit 1 Visit  OT Treatments  $Orthotics/Prosthetics Check 8-22 mins    Brynn, OTR/L  Acute Rehabilitation Services Office: [phone] .

## 2021-09-10 NOTE — TOC CAGE-AID Note (Signed)
Transition of Care [facility]) - CAGE-AID Screening   Patient Details  Name: Jeff [name] MRN: [id number] Date of Birth: [birth year]  [facility]) CM/SW Contact:    Carley Hammed, LCSWA Phone Number: 09/10/2021, 11:22 AM   Clinical Narrative: Per chart review, pt is not appropriate for CAGE- AID assessment at this time.   CAGE-AID Screening: Substance Abuse Screening unable to be completed due to: : Patient unable to participate

## 2021-09-10 NOTE — Anesthesia Preprocedure Evaluation (Signed)
Anesthesia Evaluation  Patient identified by MRN, date of birth, ID band  Reviewed: Allergy & Precautions, Patient's Chart, lab work & pertinent test results  Airway Mallampati: Trach       Dental   Pulmonary Current Smoker and Patient abstained from smoking.,  Trach collar    trach  breath sounds clear to auscultation       Cardiovascular Normal cardiovascular exam     Neuro/Psych TBI (traumatic brain injury)    GI/Hepatic   Endo/Other    Renal/GU      Musculoskeletal   Abdominal   Peds  Hematology  (+) Blood dyscrasia, anemia ,   Anesthesia Other Findings right thigh degloving  Reproductive/Obstetrics                          Anesthesia Physical Anesthesia Plan  ASA: 3  Anesthesia Plan: General   Post-op Pain Management:    Induction: Intravenous  PONV Risk Score and Plan: 2 and Ondansetron, Dexamethasone, Midazolam and Treatment may vary due to age or medical condition  Airway Management Planned: Tracheostomy  Additional Equipment:   Intra-op Plan:   Post-operative Plan: Extubation in OR  Informed Consent: I have reviewed the patients History and Physical, chart, labs and discussed the procedure including the risks, benefits and alternatives for the proposed anesthesia with the patient or authorized representative who has indicated his/her understanding and acceptance.     Consent reviewed with POA  Plan Discussed with: CRNA  Anesthesia Plan Comments: (Anesthetic plan discussed with sister via telephone)     Anesthesia Quick Evaluation

## 2021-09-11 ENCOUNTER — Other Ambulatory Visit: Payer: Self-pay

## 2021-09-11 ENCOUNTER — Inpatient Hospital Stay (HOSPITAL_COMMUNITY): Payer: Medicaid Other | Admitting: Certified Registered Nurse Anesthetist

## 2021-09-11 ENCOUNTER — Encounter (HOSPITAL_COMMUNITY)
Admission: EM | Disposition: A | Discharge status: Other Acute Inpatient Hospital | Payer: Self-pay | Source: Home / Self Care

## 2021-09-11 ENCOUNTER — Encounter (HOSPITAL_COMMUNITY): Payer: Self-pay | Admitting: Pulmonary Disease

## 2021-09-11 ENCOUNTER — Inpatient Hospital Stay (HOSPITAL_COMMUNITY): Payer: Medicaid Other

## 2021-09-11 DIAGNOSIS — T07XXXA Unspecified multiple injuries, initial encounter: Secondary | ICD-10-CM | POA: Diagnosis not present

## 2021-09-11 DIAGNOSIS — G9341 Metabolic encephalopathy: Secondary | ICD-10-CM | POA: Diagnosis not present

## 2021-09-11 DIAGNOSIS — J9601 Acute respiratory failure with hypoxia: Secondary | ICD-10-CM | POA: Diagnosis not present

## 2021-09-11 DIAGNOSIS — I824Y1 Acute embolism and thrombosis of unspecified deep veins of right proximal lower extremity: Secondary | ICD-10-CM

## 2021-09-11 DIAGNOSIS — S81801A Unspecified open wound, right lower leg, initial encounter: Secondary | ICD-10-CM | POA: Diagnosis not present

## 2021-09-11 DIAGNOSIS — D649 Anemia, unspecified: Secondary | ICD-10-CM

## 2021-09-11 DIAGNOSIS — M9689 Other intraoperative and postprocedural complications and disorders of the musculoskeletal system: Secondary | ICD-10-CM | POA: Diagnosis not present

## 2021-09-11 HISTORY — PX: I & D EXTREMITY: SHX5045

## 2021-09-11 LAB — GLUCOSE, CAPILLARY
Glucose-Capillary: 108 mg/dL — ABNORMAL HIGH (ref 70–99)
Glucose-Capillary: 108 mg/dL — ABNORMAL HIGH (ref 70–99)
Glucose-Capillary: 126 mg/dL — ABNORMAL HIGH (ref 70–99)
Glucose-Capillary: 135 mg/dL — ABNORMAL HIGH (ref 70–99)
Glucose-Capillary: 143 mg/dL — ABNORMAL HIGH (ref 70–99)

## 2021-09-11 SURGERY — IRRIGATION AND DEBRIDEMENT EXTREMITY
Anesthesia: General

## 2021-09-11 MED ORDER — PROPOFOL 10 MG/ML IV BOLUS
INTRAVENOUS | Status: AC
  Filled 2021-09-11: qty 20

## 2021-09-11 MED ORDER — ORAL CARE MOUTH RINSE: 15.0000 mL | Freq: Once | OROMUCOSAL | Status: AC

## 2021-09-11 MED ORDER — ACETAMINOPHEN 10 MG/ML IV SOLN: 1000.0000 mg | Freq: Once | INTRAVENOUS | Status: DC | PRN

## 2021-09-11 MED ORDER — LIDOCAINE 2% (20 MG/ML) 5 ML SYRINGE
INTRAMUSCULAR | Status: AC
Start: 2021-09-11 — End: ?
  Filled 2021-09-11: qty 5

## 2021-09-11 MED ORDER — SUGAMMADEX SODIUM 200 MG/2ML IV SOLN
INTRAVENOUS | Status: DC | PRN
  Administered 2021-09-11: 50 mg via INTRAVENOUS

## 2021-09-11 MED ORDER — DEXAMETHASONE SODIUM PHOSPHATE 10 MG/ML IJ SOLN
INTRAMUSCULAR | Status: AC
  Filled 2021-09-11: qty 1

## 2021-09-11 MED ORDER — PROPOFOL 10 MG/ML IV BOLUS
INTRAVENOUS | Status: DC | PRN
  Administered 2021-09-11: 100 mg via INTRAVENOUS
  Administered 2021-09-11: 50 mg via INTRAVENOUS

## 2021-09-11 MED ORDER — CHLORHEXIDINE GLUCONATE 0.12 % MT SOLN
OROMUCOSAL | Status: AC
  Administered 2021-09-11: 15 mL via OROMUCOSAL
  Filled 2021-09-11: qty 15

## 2021-09-11 MED ORDER — FENTANYL CITRATE (PF) 250 MCG/5ML IJ SOLN
INTRAMUSCULAR | Status: AC
  Filled 2021-09-11: qty 5

## 2021-09-11 MED ORDER — ONDANSETRON HCL 4 MG/2ML IJ SOLN
INTRAMUSCULAR | Status: DC | PRN
  Administered 2021-09-11: 4 mg via INTRAVENOUS

## 2021-09-11 MED ORDER — OXYCODONE HCL 5 MG PO TABS: 5.0000 mg | ORAL_TABLET | Freq: Once | ORAL | Status: DC | PRN

## 2021-09-11 MED ORDER — ROCURONIUM BROMIDE 10 MG/ML (PF) SYRINGE
PREFILLED_SYRINGE | INTRAVENOUS | Status: AC
  Filled 2021-09-11: qty 10

## 2021-09-11 MED ORDER — DEXAMETHASONE SODIUM PHOSPHATE 10 MG/ML IJ SOLN
INTRAMUSCULAR | Status: DC | PRN
  Administered 2021-09-11: 5 mg via INTRAVENOUS

## 2021-09-11 MED ORDER — CHLORHEXIDINE GLUCONATE 0.12 % MT SOLN: 15.0000 mL | Freq: Once | OROMUCOSAL | Status: AC

## 2021-09-11 MED ORDER — AMISULPRIDE (ANTIEMETIC) 5 MG/2ML IV SOLN: 10.0000 mg | Freq: Once | INTRAVENOUS | Status: DC | PRN

## 2021-09-11 MED ORDER — MIDAZOLAM HCL 2 MG/2ML IJ SOLN
INTRAMUSCULAR | Status: AC
  Filled 2021-09-11: qty 2

## 2021-09-11 MED ORDER — FENTANYL CITRATE (PF) 100 MCG/2ML IJ SOLN: 25.0000 ug | INTRAMUSCULAR | Status: DC | PRN

## 2021-09-11 MED ORDER — CEFAZOLIN SODIUM-DEXTROSE 2-4 GM/100ML-% IV SOLN
INTRAVENOUS | Status: AC
  Filled 2021-09-11: qty 100

## 2021-09-11 MED ORDER — LIDOCAINE 2% (20 MG/ML) 5 ML SYRINGE
INTRAMUSCULAR | Status: DC | PRN
  Administered 2021-09-11: 60 mg via INTRAVENOUS

## 2021-09-11 MED ORDER — ONDANSETRON HCL 4 MG/2ML IJ SOLN
INTRAMUSCULAR | Status: AC
Start: 2021-09-11 — End: ?
  Filled 2021-09-11: qty 2

## 2021-09-11 MED ORDER — OXYCODONE HCL 5 MG/5ML PO SOLN: 5.0000 mg | Freq: Once | ORAL | Status: DC | PRN

## 2021-09-11 MED ORDER — ROCURONIUM BROMIDE 10 MG/ML (PF) SYRINGE
PREFILLED_SYRINGE | INTRAVENOUS | Status: DC | PRN
  Administered 2021-09-11: 30 mg via INTRAVENOUS

## 2021-09-11 MED ORDER — PROMETHAZINE HCL 25 MG/ML IJ SOLN: 6.2500 mg | INTRAMUSCULAR | Status: DC | PRN

## 2021-09-11 MED ORDER — LACTATED RINGERS IV SOLN: INTRAVENOUS | Status: DC

## 2021-09-11 MED ORDER — FENTANYL CITRATE (PF) 250 MCG/5ML IJ SOLN
INTRAMUSCULAR | Status: DC | PRN
  Administered 2021-09-11: 150 ug via INTRAVENOUS
  Administered 2021-09-11 (×2): 50 ug via INTRAVENOUS

## 2021-09-11 SURGICAL SUPPLY — 69 items
APL SKNCLS STERI-STRIP NONHPOA (GAUZE/BANDAGES/DRESSINGS) ×1
BAG COUNTER SPONGE SURGICOUNT (BAG) IMPLANT
BAG SPNG CNTER NS LX DISP (BAG)
BENZOIN TINCTURE PRP APPL 2/3 (GAUZE/BANDAGES/DRESSINGS) IMPLANT
BNDG CMPR 5X4 CHSV STRCH STRL (GAUZE/BANDAGES/DRESSINGS) ×1
BNDG COHESIVE 4X5 TAN STRL (GAUZE/BANDAGES/DRESSINGS) ×1 IMPLANT
BNDG COHESIVE 4X5 TAN STRL LF (GAUZE/BANDAGES/DRESSINGS) IMPLANT
BNDG GAUZE ELAST 4 BULKY (GAUZE/BANDAGES/DRESSINGS) ×2 IMPLANT
BRUSH SCRUB EZ PLAIN DRY (MISCELLANEOUS) ×2 IMPLANT
CANISTER WOUNDNEG PRESSURE 500 (CANNISTER) IMPLANT
COVER MAYO STAND STRL (DRAPES) IMPLANT
COVER SURGICAL LIGHT HANDLE (MISCELLANEOUS) ×2 IMPLANT
DRAPE HALF SHEET 40X57 (DRAPES) IMPLANT
DRAPE INCISE IOBAN 66X45 STRL (DRAPES) IMPLANT
DRAPE ORTHO SPLIT 77X108 STRL (DRAPES) ×2
DRAPE SURG ORHT 6 SPLT 77X108 (DRAPES) IMPLANT
DRAPE U-SHAPE 47X51 STRL (DRAPES) ×1 IMPLANT
DRSG ADAPTIC 3X8 NADH LF (GAUZE/BANDAGES/DRESSINGS) ×1 IMPLANT
DRSG CUTIMED SORBACT 7X9 (GAUZE/BANDAGES/DRESSINGS) IMPLANT
DRSG PAD ABDOMINAL 8X10 ST (GAUZE/BANDAGES/DRESSINGS) IMPLANT
DRSG VAC ATS LRG SENSATRAC (GAUZE/BANDAGES/DRESSINGS) IMPLANT
ELECT REM PT RETURN 9FT ADLT (ELECTROSURGICAL)
ELECTRODE REM PT RTRN 9FT ADLT (ELECTROSURGICAL) IMPLANT
GAUZE SPONGE 4X4 12PLY STRL (GAUZE/BANDAGES/DRESSINGS) ×1 IMPLANT
GLOVE BIO SURGEON STRL SZ7.5 (GLOVE) ×1 IMPLANT
GLOVE BIO SURGEON STRL SZ8 (GLOVE) ×1 IMPLANT
GLOVE BIOGEL PI IND STRL 7.5 (GLOVE) ×1 IMPLANT
GLOVE BIOGEL PI IND STRL 8 (GLOVE) ×1 IMPLANT
GLOVE BIOGEL PI IND STRL 9 (GLOVE) ×1 IMPLANT
GLOVE BIOGEL PI INDICATOR 7.5 (GLOVE) ×1
GLOVE BIOGEL PI INDICATOR 8 (GLOVE) ×1
GLOVE BIOGEL PI INDICATOR 9 (GLOVE) ×1
GLOVE SURG ORTHO LTX SZ7.5 (GLOVE) ×2 IMPLANT
GOWN STRL REUS W/ TWL LRG LVL3 (GOWN DISPOSABLE) ×2 IMPLANT
GOWN STRL REUS W/ TWL XL LVL3 (GOWN DISPOSABLE) ×1 IMPLANT
GOWN STRL REUS W/TWL LRG LVL3 (GOWN DISPOSABLE) ×2
GOWN STRL REUS W/TWL XL LVL3 (GOWN DISPOSABLE) ×1
GRAFT MYRIAD 20X20 (Graft) IMPLANT
HANDPIECE INTERPULSE COAX TIP (DISPOSABLE)
IMMOBILIZER KNEE 22 UNIV (SOFTGOODS) IMPLANT
KIT BASIN OR (CUSTOM PROCEDURE TRAY) ×1 IMPLANT
KIT TURNOVER KIT B (KITS) ×1 IMPLANT
MANIFOLD NEPTUNE II (INSTRUMENTS) ×1 IMPLANT
NS IRRIG 1000ML POUR BTL (IV SOLUTION) ×1 IMPLANT
PACK ORTHO EXTREMITY (CUSTOM PROCEDURE TRAY) ×1 IMPLANT
PAD ARMBOARD 7.5X6 YLW CONV (MISCELLANEOUS) ×2 IMPLANT
PADDING CAST COTTON 6X4 STRL (CAST SUPPLIES) ×1 IMPLANT
PILLOW ABDUCTION MEDIUM (MISCELLANEOUS) IMPLANT
POWDER MYRIAD MORCELLS 1000MG (Miscellaneous) IMPLANT
POWDER MYRIAD MORCELLS 500MG (Miscellaneous) IMPLANT
SET HNDPC FAN SPRY TIP SCT (DISPOSABLE) IMPLANT
SOL PREP POV-IOD 4OZ 10% (MISCELLANEOUS) ×1 IMPLANT
SOL SCRUB PVP POV-IOD 4OZ 7.5% (MISCELLANEOUS) ×1
SOLUTION SCRB POV-IOD 4OZ 7.5% (MISCELLANEOUS) ×1 IMPLANT
SPONGE T-LAP 18X18 ~~LOC~~+RFID (SPONGE) ×1 IMPLANT
STAPLER VISISTAT 35W (STAPLE) IMPLANT
STOCKINETTE IMPERVIOUS 9X36 MD (GAUZE/BANDAGES/DRESSINGS) IMPLANT
SUT ETHILON 2 0 PSLX (SUTURE) IMPLANT
SUT PDS AB 2-0 CT1 27 (SUTURE) IMPLANT
SUT PROLENE 3 0 PS 1 (SUTURE) IMPLANT
SUT VIC AB 3-0 SH 18 (SUTURE) IMPLANT
SYR TOOMEY 50ML (SYRINGE) IMPLANT
TOWEL GREEN STERILE (TOWEL DISPOSABLE) ×2 IMPLANT
TOWEL GREEN STERILE FF (TOWEL DISPOSABLE) ×1 IMPLANT
TUBE CONNECTING 12X1/4 (SUCTIONS) ×1 IMPLANT
UNDERPAD 30X36 HEAVY ABSORB (UNDERPADS AND DIAPERS) ×1 IMPLANT
WATER STERILE IRR 1000ML POUR (IV SOLUTION) ×1 IMPLANT
YANKAUER SUCT BULB TIP NO VENT (SUCTIONS) ×1 IMPLANT
elastic bandage 6inx15yds IMPLANT

## 2021-09-11 NOTE — Progress Notes (Signed)
No changes overnight.  The risks and benefits of surgery for right thigh coverage were discussed with sister, including the possibility of infection, nerve injury, vessel injury, wound breakdown, arthritis, symptomatic hardware, DVT/ PE, loss of motion, malunion, nonunion, and need for further surgery among others.  She acknowledged these risks and provided consent to proceed.  Myrene Galas, MD [facility], [facility] [phone]

## 2021-09-11 NOTE — Anesthesia Postprocedure Evaluation (Signed)
Anesthesia Post Note  Patient: Nayan Andreas  Procedure(s) Performed: IRRIGATION AND DEBRIDEMENT OF RIGHT THIGH AND VAC CHANGE WITH MYRIAD APPLICATION     Patient location during evaluation: PACU Anesthesia Type: General Level of consciousness: awake Pain management: pain level controlled Vital Signs Assessment: post-procedure vital signs reviewed and stable Respiratory status: spontaneous breathing, nonlabored ventilation, respiratory function stable and patient connected to nasal cannula oxygen Cardiovascular status: blood pressure returned to baseline and stable Postop Assessment: no apparent nausea or vomiting Anesthetic complications: no   No notable events documented.  Last Vitals:  Vitals:   09/11/21 1945 09/11/21 2000  BP:  (!) 119/55  Pulse: (!) 103 (!) 103  Resp: 18 18  Temp:  37.6 C  SpO2: 95% 95%    Last Pain:  Vitals:   09/11/21 2000  TempSrc: Axillary  PainSc:                   P      

## 2021-09-11 NOTE — Progress Notes (Signed)
OT Cancellation Note  Patient Details Name: Jeff [name] MRN: [id number] DOB: 01-18-2001   Cancelled Treatment:    Reason Eval/Treat Not Completed: Patient at procedure or test/ unavailable (surgery)  [name],[name] 09/11/2021, 8:43 AM [name], OT/L   Acute OT Clinical Specialist Acute Rehabilitation Services Pager [phone] Office [phone]

## 2021-09-11 NOTE — Progress Notes (Signed)
PT Cancellation Note  Patient Details Name: Jeff [name] MRN: [id number] DOB: November 14, 2000   Cancelled Treatment:    Reason Eval/Treat Not Completed: Patient at procedure or test/unavailable; patient down in OR for I&D and wound vac change.  Will attempt another day.   Elray Mcgregor 09/11/2021, 9:27 AM Sheran Lawless, PT Acute Rehabilitation Services [phone] 09/11/2021
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
## 2021-09-11 NOTE — Anesthesia Procedure Notes (Addendum)
Procedure Name: Intubation Date/Time: 09/11/2021 8:25 AM  Performed by: Shary Decamp, CRNAPre-anesthesia Checklist: Patient identified, Patient being monitored, Timeout performed, Emergency Drugs available and Suction available Patient Re-evaluated:Patient Re-evaluated prior to induction Oxygen Delivery Method: Circle system utilized Preoxygenation: Pre-oxygenation with 100% oxygen Induction Type: IV induction and Tracheostomy Tube size: 6.5 mm Number of attempts: 1 Airway Equipment and Method: Tracheostomy Placement Confirmation: ETT inserted through vocal cords under direct vision, positive ETCO2 and breath sounds checked- equal and bilateral Secured at: 13 cm Tube secured with: Tape Dental Injury: Teeth and Oropharynx as per pre-operative assessment

## 2021-09-11 NOTE — Transfer of Care (Signed)
Immediate Anesthesia Transfer of Care Note  Patient: Jeff [name]  Procedure(s) Performed: IRRIGATION AND DEBRIDEMENT OF RIGHT THIGH AND VAC CHANGE WITH MYRIAD APPLICATION  Patient Location: PACU  Anesthesia Type:General  Level of Consciousness: drowsy, patient cooperative and responds to stimulation  Airway & Oxygen Therapy: Patient Spontanous Breathing and Patient connected to tracheostomy mask oxygen  Post-op Assessment: Report given to RN and Post -op Vital signs reviewed and stable  Post vital signs: Reviewed and stable  Last Vitals:  Vitals Value Taken Time  BP    Temp    Pulse 105 09/11/21 1028  Resp 32 09/11/21 1028  SpO2 100 % 09/11/21 1028  Vitals shown include unvalidated device data.  Last Pain:  Vitals:   09/11/21 0707  TempSrc:   PainSc: 10-Worst pain ever      Patients Stated Pain Goal: 0 (86/28/24 1753)  Complications: No notable events documented.

## 2021-09-11 NOTE — Brief Op Note (Signed)
09/11/2021  11:30 AM  PATIENT:  Jeff [name]  21 y.o. male  PRE-OPERATIVE DIAGNOSIS:  right thigh degloving  POST-OPERATIVE DIAGNOSIS:  right thigh degloving  dictated

## 2021-09-12 ENCOUNTER — Encounter (HOSPITAL_COMMUNITY): Payer: Self-pay | Admitting: Orthopedic Surgery

## 2021-09-12 DIAGNOSIS — T07XXXA Unspecified multiple injuries, initial encounter: Secondary | ICD-10-CM | POA: Diagnosis not present

## 2021-09-12 DIAGNOSIS — S81801A Unspecified open wound, right lower leg, initial encounter: Secondary | ICD-10-CM | POA: Diagnosis not present

## 2021-09-12 DIAGNOSIS — J9601 Acute respiratory failure with hypoxia: Secondary | ICD-10-CM | POA: Diagnosis not present

## 2021-09-12 DIAGNOSIS — G9341 Metabolic encephalopathy: Secondary | ICD-10-CM | POA: Diagnosis not present

## 2021-09-12 LAB — HEPARIN ANTI-XA: Heparin LMW: 0.91 IU/mL

## 2021-09-12 LAB — GLUCOSE, CAPILLARY
Glucose-Capillary: 115 mg/dL — ABNORMAL HIGH (ref 70–99)
Glucose-Capillary: 154 mg/dL — ABNORMAL HIGH (ref 70–99)
Glucose-Capillary: 120 mg/dL — ABNORMAL HIGH (ref 70–99)
Glucose-Capillary: 143 mg/dL — ABNORMAL HIGH (ref 70–99)
Glucose-Capillary: 110 mg/dL — ABNORMAL HIGH (ref 70–99)
Glucose-Capillary: 131 mg/dL — ABNORMAL HIGH (ref 70–99)

## 2021-09-12 MED ORDER — PROSOURCE TF20 ENFIT COMPATIBL EN LIQD
60.0000 mL | Freq: Three times a day (TID) | ENTERAL | Status: DC
Start: 2021-09-12 — End: 2021-09-15
  Administered 2021-09-12 – 2021-09-15 (×8): 60 mL
  Filled 2021-09-12 (×8): qty 60

## 2021-09-12 MED ORDER — ENSURE ENLIVE PO LIQD
237.0000 mL | Freq: Two times a day (BID) | ORAL | Status: DC
  Administered 2021-09-13 – 2021-09-19 (×10): 237 mL via ORAL

## 2021-09-12 MED ORDER — OSMOLITE 1.5 CAL PO LIQD
996.0000 mL | ORAL | Status: DC
  Administered 2021-09-12 – 2021-09-14 (×3): 996 mL
  Filled 2021-09-12 (×4): qty 1000
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
## 2021-09-13 DIAGNOSIS — G9341 Metabolic encephalopathy: Secondary | ICD-10-CM | POA: Diagnosis not present

## 2021-09-13 DIAGNOSIS — S81801A Unspecified open wound, right lower leg, initial encounter: Secondary | ICD-10-CM | POA: Diagnosis not present

## 2021-09-13 DIAGNOSIS — J9601 Acute respiratory failure with hypoxia: Secondary | ICD-10-CM | POA: Diagnosis not present

## 2021-09-13 DIAGNOSIS — T07XXXA Unspecified multiple injuries, initial encounter: Secondary | ICD-10-CM | POA: Diagnosis not present

## 2021-09-13 LAB — COMPREHENSIVE METABOLIC PANEL
ALT: 108 U/L — ABNORMAL HIGH (ref 0–44)
AST: 40 U/L (ref 15–41)
Albumin: 2.8 g/dL — ABNORMAL LOW (ref 3.5–5.0)
Alkaline Phosphatase: 173 U/L — ABNORMAL HIGH (ref 38–126)
Anion gap: 10 (ref 5–15)
BUN: 13 mg/dL (ref 6–20)
CO2: 25 mmol/L (ref 22–32)
Calcium: 8.8 mg/dL — ABNORMAL LOW (ref 8.9–10.3)
Chloride: 100 mmol/L (ref 98–111)
Creatinine, Ser: 0.51 mg/dL — ABNORMAL LOW (ref 0.61–1.24)
GFR, Estimated: >60 mL/min (ref >60)
Glucose, Bld: 101 mg/dL — ABNORMAL HIGH (ref 70–99)
Potassium: 4.2 mmol/L (ref 3.5–5.1)
Sodium: 135 mmol/L (ref 135–145)
Total Bilirubin: 0.5 mg/dL (ref 0.3–1.2)
Total Protein: 6.3 g/dL — ABNORMAL LOW (ref 6.5–8.1)

## 2021-09-13 LAB — CBC
HCT: 33.3 % — ABNORMAL LOW (ref 39.0–52.0)
Hemoglobin: 10.9 g/dL — ABNORMAL LOW (ref 13.0–17.0)
MCH: 30.2 pg (ref 26.0–34.0)
MCHC: 32.7 g/dL (ref 30.0–36.0)
MCV: 92.2 fL (ref 80.0–100.0)
Platelets: 326 10*3/uL (ref 150–400)
RBC: 3.61 MIL/uL — ABNORMAL LOW (ref 4.22–5.81)
RDW: 14.8 % (ref 11.5–15.5)
WBC: 16.1 10*3/uL — ABNORMAL HIGH (ref 4.0–10.5)
nRBC: 0 % (ref 0.0–0.2)

## 2021-09-13 LAB — GLUCOSE, CAPILLARY
Glucose-Capillary: 102 mg/dL — ABNORMAL HIGH (ref 70–99)
Glucose-Capillary: 109 mg/dL — ABNORMAL HIGH (ref 70–99)
Glucose-Capillary: 117 mg/dL — ABNORMAL HIGH (ref 70–99)
Glucose-Capillary: 126 mg/dL — ABNORMAL HIGH (ref 70–99)
Glucose-Capillary: 136 mg/dL — ABNORMAL HIGH (ref 70–99)
Glucose-Capillary: 91 mg/dL (ref 70–99)
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
## 2021-09-13 NOTE — Progress Notes (Signed)
Inpatient Rehab Admissions Coordinator:  ° °Per therapy recommendation,  patient was screened for CIR candidacy by Xyler Terpening, MS, CCC-SLP. At this time, Pt. Appears to be a a potential candidate for CIR. I will place   order for rehab consult per protocol for full assessment. Please contact me any with questions. ° °Ryne Mctigue, MS, CCC-SLP °Rehab Admissions Coordinator  °[phone] (celll) °[phone] (office) ° °

## 2021-09-13 NOTE — Progress Notes (Signed)
Orthopedic Tech Progress Note Patient Details:  Jeff [name] [birth year] [id number]  Applied the shortest knee immobilizer we had to RLE per order from [name], PA-C.  Ortho Devices Type of Ortho Device: Knee Immobilizer Ortho Device/Splint Location: RLE Ortho Device/Splint Interventions: Ordered, Application   Post Interventions Patient Tolerated: Well Instructions Provided: Adjustment of device, Care of device  Jeff [name] [name] 09/13/2021, 11:17 AM
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
## 2021-09-14 DIAGNOSIS — T07XXXA Unspecified multiple injuries, initial encounter: Secondary | ICD-10-CM | POA: Diagnosis not present

## 2021-09-14 DIAGNOSIS — G9341 Metabolic encephalopathy: Secondary | ICD-10-CM | POA: Diagnosis not present

## 2021-09-14 DIAGNOSIS — J9601 Acute respiratory failure with hypoxia: Secondary | ICD-10-CM | POA: Diagnosis not present

## 2021-09-14 DIAGNOSIS — S81801A Unspecified open wound, right lower leg, initial encounter: Secondary | ICD-10-CM | POA: Diagnosis not present

## 2021-09-14 LAB — GLUCOSE, CAPILLARY
Glucose-Capillary: 105 mg/dL — ABNORMAL HIGH (ref 70–99)
Glucose-Capillary: 115 mg/dL — ABNORMAL HIGH (ref 70–99)
Glucose-Capillary: 130 mg/dL — ABNORMAL HIGH (ref 70–99)
Glucose-Capillary: 134 mg/dL — ABNORMAL HIGH (ref 70–99)
Glucose-Capillary: 93 mg/dL (ref 70–99)
Glucose-Capillary: 95 mg/dL (ref 70–99)

## 2021-09-14 LAB — COPPER, SERUM: Copper: 188 ug/dL — ABNORMAL HIGH (ref 63–121)

## 2021-09-14 LAB — ZINC: Zinc: 108 ug/dL (ref 44–115)
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
## 2021-09-15 DIAGNOSIS — Z930 Tracheostomy status: Secondary | ICD-10-CM

## 2021-09-15 DIAGNOSIS — T07XXXA Unspecified multiple injuries, initial encounter: Secondary | ICD-10-CM | POA: Diagnosis not present

## 2021-09-15 DIAGNOSIS — G9341 Metabolic encephalopathy: Secondary | ICD-10-CM | POA: Diagnosis not present

## 2021-09-15 DIAGNOSIS — J9601 Acute respiratory failure with hypoxia: Secondary | ICD-10-CM | POA: Diagnosis not present

## 2021-09-15 DIAGNOSIS — S81801A Unspecified open wound, right lower leg, initial encounter: Secondary | ICD-10-CM | POA: Diagnosis not present

## 2021-09-15 LAB — CBC
HCT: 36.5 % — ABNORMAL LOW (ref 39.0–52.0)
Hemoglobin: 11.7 g/dL — ABNORMAL LOW (ref 13.0–17.0)
MCH: 29.8 pg (ref 26.0–34.0)
MCHC: 32.1 g/dL (ref 30.0–36.0)
MCV: 92.9 fL (ref 80.0–100.0)
Platelets: 364 10*3/uL (ref 150–400)
RBC: 3.93 MIL/uL — ABNORMAL LOW (ref 4.22–5.81)
RDW: 14.9 % (ref 11.5–15.5)
WBC: 12 10*3/uL — ABNORMAL HIGH (ref 4.0–10.5)
nRBC: 0 % (ref 0.0–0.2)

## 2021-09-15 LAB — GLUCOSE, CAPILLARY
Glucose-Capillary: 153 mg/dL — ABNORMAL HIGH (ref 70–99)
Glucose-Capillary: 118 mg/dL — ABNORMAL HIGH (ref 70–99)
Glucose-Capillary: 123 mg/dL — ABNORMAL HIGH (ref 70–99)
Glucose-Capillary: 133 mg/dL — ABNORMAL HIGH (ref 70–99)
Glucose-Capillary: 174 mg/dL — ABNORMAL HIGH (ref 70–99)
Glucose-Capillary: 103 mg/dL — ABNORMAL HIGH (ref 70–99)

## 2021-09-15 MED ORDER — OXYCODONE HCL 5 MG PO TABS
10.0000 mg | ORAL_TABLET | ORAL | Status: DC | PRN
  Administered 2021-09-15: 10 mg via ORAL
  Filled 2021-09-15 (×2): qty 2

## 2021-09-15 MED ORDER — MELATONIN 5 MG PO TABS
5.0000 mg | ORAL_TABLET | Freq: Every day | ORAL | Status: DC
  Administered 2021-09-15 – 2021-09-18 (×4): 5 mg via ORAL
  Filled 2021-09-15 (×4): qty 1

## 2021-09-15 MED ORDER — POLYETHYLENE GLYCOL 3350 17 G PO PACK: 17.0000 g | PACK | Freq: Every day | ORAL | Status: DC | PRN

## 2021-09-15 MED ORDER — VITAMIN D 25 MCG (1000 UNIT) PO TABS
1000.0000 [IU] | ORAL_TABLET | Freq: Every day | ORAL | Status: DC
  Administered 2021-09-16 – 2021-09-19 (×4): 1000 [IU] via ORAL
  Filled 2021-09-15 (×4): qty 1

## 2021-09-15 MED ORDER — AMANTADINE HCL 100 MG PO CAPS
100.0000 mg | ORAL_CAPSULE | Freq: Two times a day (BID) | ORAL | Status: DC
  Administered 2021-09-15 – 2021-09-19 (×8): 100 mg via ORAL
  Filled 2021-09-15 (×9): qty 1

## 2021-09-15 MED ORDER — PROPRANOLOL HCL 40 MG PO TABS
40.0000 mg | ORAL_TABLET | Freq: Three times a day (TID) | ORAL | Status: DC
  Administered 2021-09-15 – 2021-09-19 (×12): 40 mg via ORAL
  Filled 2021-09-15 (×13): qty 1

## 2021-09-15 MED ORDER — ACETAMINOPHEN 500 MG PO TABS: 500.0000 mg | ORAL_TABLET | Freq: Two times a day (BID) | ORAL | Status: DC

## 2021-09-15 MED ORDER — PROPRANOLOL HCL 40 MG PO TABS
40.0000 mg | ORAL_TABLET | Freq: Once | ORAL | Status: AC
  Administered 2021-09-15: 40 mg via ORAL
  Filled 2021-09-15: qty 1

## 2021-09-15 MED ORDER — ONDANSETRON HCL 4 MG/2ML IJ SOLN: 4.0000 mg | Freq: Four times a day (QID) | INTRAMUSCULAR | Status: DC | PRN

## 2021-09-15 MED ORDER — DOCUSATE SODIUM 100 MG PO CAPS: 100.0000 mg | ORAL_CAPSULE | Freq: Two times a day (BID) | ORAL | Status: DC | PRN

## 2021-09-15 MED ORDER — SERTRALINE HCL 50 MG PO TABS
50.0000 mg | ORAL_TABLET | Freq: Every day | ORAL | Status: DC
  Administered 2021-09-15 – 2021-09-18 (×4): 50 mg via ORAL
  Filled 2021-09-15 (×4): qty 1

## 2021-09-15 MED ORDER — GABAPENTIN 400 MG PO CAPS
400.0000 mg | ORAL_CAPSULE | Freq: Three times a day (TID) | ORAL | Status: DC
  Administered 2021-09-15 – 2021-09-19 (×11): 400 mg via ORAL
  Filled 2021-09-15 (×11): qty 1

## 2021-09-15 MED ORDER — PANTOPRAZOLE SODIUM 40 MG PO TBEC: 40.0000 mg | DELAYED_RELEASE_TABLET | Freq: Every day | ORAL | Status: DC

## 2021-09-15 MED ORDER — ASCORBIC ACID 500 MG PO TABS
500.0000 mg | ORAL_TABLET | Freq: Two times a day (BID) | ORAL | Status: DC
  Administered 2021-09-15 – 2021-09-19 (×8): 500 mg via ORAL
  Filled 2021-09-15 (×8): qty 1

## 2021-09-15 MED ORDER — ONDANSETRON HCL 4 MG PO TABS: 4.0000 mg | ORAL_TABLET | Freq: Four times a day (QID) | ORAL | Status: DC | PRN

## 2021-09-15 MED ORDER — LOPERAMIDE HCL 2 MG PO CAPS
2.0000 mg | ORAL_CAPSULE | ORAL | Status: DC | PRN
Start: 2021-09-15 — End: 2021-09-19

## 2021-09-15 MED ORDER — ACETAMINOPHEN 500 MG PO TABS
500.0000 mg | ORAL_TABLET | Freq: Two times a day (BID) | ORAL | Status: DC
  Administered 2021-09-15 – 2021-09-19 (×8): 500 mg via ORAL
  Filled 2021-09-15 (×8): qty 1
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
## 2021-09-15 NOTE — TOC Progression Note (Signed)
Transition of Care [facility]) - Progression Note    Patient Details  Name: Jeff [name] MRN: [id number] Date of Birth: 2000/07/16  [facility] CM/SW Contact  Kermit Balo, RN Phone Number: 09/15/2021, 11:36 AM  Clinical Narrative:    Pt continues with trach/ cortrak/ vac dressing.  Recommendations are for CIR. CIR to evaluate. TOC following.   Expected Discharge Plan: IP Rehab Facility Barriers to Discharge: Continued Medical Work up  Expected Discharge Plan and Services Expected Discharge Plan: IP Rehab Facility   Discharge Planning Services: CM Consult   Living arrangements for the past 2 months: Apartment                                       Social Determinants of Health (SDOH) Interventions    Readmission Risk Interventions     No data to display
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
## 2021-09-16 DIAGNOSIS — S81801A Unspecified open wound, right lower leg, initial encounter: Secondary | ICD-10-CM | POA: Diagnosis not present

## 2021-09-16 DIAGNOSIS — J9601 Acute respiratory failure with hypoxia: Secondary | ICD-10-CM | POA: Diagnosis not present

## 2021-09-16 DIAGNOSIS — T07XXXA Unspecified multiple injuries, initial encounter: Secondary | ICD-10-CM | POA: Diagnosis not present

## 2021-09-16 DIAGNOSIS — G9341 Metabolic encephalopathy: Secondary | ICD-10-CM | POA: Diagnosis not present

## 2021-09-16 LAB — CBC
HCT: 37 % — ABNORMAL LOW (ref 39.0–52.0)
Hemoglobin: 12 g/dL — ABNORMAL LOW (ref 13.0–17.0)
MCH: 30.2 pg (ref 26.0–34.0)
MCHC: 32.4 g/dL (ref 30.0–36.0)
MCV: 93.2 fL (ref 80.0–100.0)
Platelets: 352 10*3/uL (ref 150–400)
RBC: 3.97 MIL/uL — ABNORMAL LOW (ref 4.22–5.81)
RDW: 15 % (ref 11.5–15.5)
WBC: 10.1 10*3/uL (ref 4.0–10.5)
nRBC: 0 % (ref 0.0–0.2)

## 2021-09-16 LAB — GLUCOSE, CAPILLARY
Glucose-Capillary: 111 mg/dL — ABNORMAL HIGH (ref 70–99)
Glucose-Capillary: 116 mg/dL — ABNORMAL HIGH (ref 70–99)
Glucose-Capillary: 154 mg/dL — ABNORMAL HIGH (ref 70–99)
Glucose-Capillary: 129 mg/dL — ABNORMAL HIGH (ref 70–99)

## 2021-09-16 LAB — BASIC METABOLIC PANEL
Anion gap: 10 (ref 5–15)
BUN: 10 mg/dL (ref 6–20)
CO2: 29 mmol/L (ref 22–32)
Calcium: 9.3 mg/dL (ref 8.9–10.3)
Chloride: 99 mmol/L (ref 98–111)
Creatinine, Ser: 0.69 mg/dL (ref 0.61–1.24)
GFR, Estimated: >60 mL/min (ref >60)
Glucose, Bld: 114 mg/dL — ABNORMAL HIGH (ref 70–99)
Potassium: 4 mmol/L (ref 3.5–5.1)
Sodium: 138 mmol/L (ref 135–145)

## 2021-09-16 MED ORDER — PANTOPRAZOLE 2 MG/ML SUSPENSION
40.0000 mg | Freq: Every day | ORAL | Status: DC
  Administered 2021-09-16 – 2021-09-19 (×4): 40 mg via ORAL
  Filled 2021-09-16 (×4): qty 20

## 2021-09-16 NOTE — Progress Notes (Signed)
Contacted WOC RN to troubleshoot wound vac as it was saying it had a leak and blockage. Milky white drainage was observed in draping, Ortho PA notified. WOC added new track pad and reinforced draping per Ortho. Wound vac appears to be suctioning well again, no longer saying it had a leak or blockage. Patient is scheduled for vac change and skin graft 8/24 per Ortho note.
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
## 2021-09-16 NOTE — Plan of Care (Signed)
  Problem: Education: Goal: Knowledge of General Education information will improve Description: Including pain rating scale, medication(s)/side effects and non-pharmacologic comfort measures Outcome: Progressing   Problem: Health Behavior/Discharge Planning: Goal: Ability to manage health-related needs will improve Outcome: Progressing   Problem: Clinical Measurements: Goal: Ability to maintain clinical measurements within normal limits will improve Outcome: Progressing Goal: Will remain free from infection Outcome: Progressing Goal: Diagnostic test results will improve Outcome: Progressing Goal: Respiratory complications will improve Outcome: Progressing Goal: Cardiovascular complication will be avoided Outcome: Progressing   Problem: Activity: Goal: Risk for activity intolerance will decrease Outcome: Progressing   Problem: Nutrition: Goal: Adequate nutrition will be maintained Outcome: Progressing   Problem: Coping: Goal: Level of anxiety will decrease Outcome: Progressing   Problem: Elimination: Goal: Will not experience complications related to bowel motility Outcome: Progressing Goal: Will not experience complications related to urinary retention Outcome: Progressing   Problem: Pain Managment: Goal: General experience of comfort will improve Outcome: Progressing   Problem: Safety: Goal: Ability to remain free from injury will improve Outcome: Progressing   Problem: Skin Integrity: Goal: Risk for impaired skin integrity will decrease Outcome: Progressing   Problem: Education: Goal: Knowledge of General Education information will improve Description: Including pain rating scale, medication(s)/side effects and non-pharmacologic comfort measures Outcome: Progressing   Problem: Health Behavior/Discharge Planning: Goal: Ability to manage health-related needs will improve Outcome: Progressing   Problem: Clinical Measurements: Goal: Ability to maintain  clinical measurements within normal limits will improve Outcome: Progressing Goal: Will remain free from infection Outcome: Progressing Goal: Diagnostic test results will improve Outcome: Progressing Goal: Respiratory complications will improve Outcome: Progressing Goal: Cardiovascular complication will be avoided Outcome: Progressing   Problem: Activity: Goal: Risk for activity intolerance will decrease Outcome: Progressing   Problem: Nutrition: Goal: Adequate nutrition will be maintained Outcome: Progressing   Problem: Coping: Goal: Level of anxiety will decrease Outcome: Progressing   Problem: Elimination: Goal: Will not experience complications related to bowel motility Outcome: Progressing Goal: Will not experience complications related to urinary retention Outcome: Progressing   Problem: Pain Managment: Goal: General experience of comfort will improve Outcome: Progressing   Problem: Safety: Goal: Ability to remain free from injury will improve Outcome: Progressing   Problem: Skin Integrity: Goal: Risk for impaired skin integrity will decrease Outcome: Progressing   Problem: Activity: Goal: Ability to tolerate increased activity will improve Outcome: Progressing   Problem: Respiratory: Goal: Ability to maintain a clear airway and adequate ventilation will improve Outcome: Progressing   Problem: Role Relationship: Goal: Method of communication will improve Outcome: Progressing   Problem: Education: Goal: Ability to describe self-care measures that may prevent or decrease complications (Diabetes Survival Skills Education) will improve Outcome: Progressing Goal: Individualized Educational Video(s) Outcome: Progressing   Problem: Coping: Goal: Ability to adjust to condition or change in health will improve Outcome: Progressing   Problem: Fluid Volume: Goal: Ability to maintain a balanced intake and output will improve Outcome: Progressing   Problem:  Health Behavior/Discharge Planning: Goal: Ability to identify and utilize available resources and services will improve Outcome: Progressing Goal: Ability to manage health-related needs will improve Outcome: Progressing   Problem: Metabolic: Goal: Ability to maintain appropriate glucose levels will improve Outcome: Progressing   Problem: Nutritional: Goal: Maintenance of adequate nutrition will improve Outcome: Progressing Goal: Progress toward achieving an optimal weight will improve Outcome: Progressing   Problem: Skin Integrity: Goal: Risk for impaired skin integrity will decrease Outcome: Progressing   
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
## 2021-09-16 NOTE — Progress Notes (Signed)
Inpatient Rehabilitation Admissions Coordinator   I await OT updates so that I can begin Auth for a possible CIR admit.  Ottie Glazier, RN, MSN Rehab Admissions Coordinator [phone] 09/16/2021 8:45 AM
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
## 2021-09-17 DIAGNOSIS — J690 Pneumonitis due to inhalation of food and vomit: Secondary | ICD-10-CM

## 2021-09-17 DIAGNOSIS — S73004A Unspecified dislocation of right hip, initial encounter: Secondary | ICD-10-CM

## 2021-09-17 DIAGNOSIS — S32424A Nondisplaced fracture of posterior wall of right acetabulum, initial encounter for closed fracture: Secondary | ICD-10-CM

## 2021-09-17 DIAGNOSIS — S2242XA Multiple fractures of ribs, left side, initial encounter for closed fracture: Secondary | ICD-10-CM

## 2021-09-17 DIAGNOSIS — S62241B Displaced fracture of shaft of first metacarpal bone, right hand, initial encounter for open fracture: Secondary | ICD-10-CM

## 2021-09-17 DIAGNOSIS — S225XXA Flail chest, initial encounter for closed fracture: Secondary | ICD-10-CM

## 2021-09-17 DIAGNOSIS — J9601 Acute respiratory failure with hypoxia: Secondary | ICD-10-CM | POA: Diagnosis not present

## 2021-09-17 DIAGNOSIS — J9621 Acute and chronic respiratory failure with hypoxia: Secondary | ICD-10-CM | POA: Diagnosis not present

## 2021-09-17 DIAGNOSIS — T07XXXA Unspecified multiple injuries, initial encounter: Secondary | ICD-10-CM | POA: Diagnosis not present

## 2021-09-17 DIAGNOSIS — G9341 Metabolic encephalopathy: Secondary | ICD-10-CM | POA: Diagnosis not present

## 2021-09-17 DIAGNOSIS — S299XXS Unspecified injury of thorax, sequela: Secondary | ICD-10-CM

## 2021-09-17 MED ORDER — CEFAZOLIN SODIUM-DEXTROSE 2-4 GM/100ML-% IV SOLN
2.0000 g | INTRAVENOUS | Status: AC
  Administered 2021-09-18: 2 g via INTRAVENOUS
  Filled 2021-09-17: qty 100
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
## 2021-09-17 NOTE — Plan of Care (Signed)
  Problem: Education: Goal: Knowledge of General Education information will improve Description: Including pain rating scale, medication(s)/side effects and non-pharmacologic comfort measures Outcome: Progressing   Problem: Health Behavior/Discharge Planning: Goal: Ability to manage health-related needs will improve Outcome: Progressing   Problem: Clinical Measurements: Goal: Ability to maintain clinical measurements within normal limits will improve Outcome: Progressing Goal: Will remain free from infection Outcome: Progressing Goal: Diagnostic test results will improve Outcome: Progressing Goal: Respiratory complications will improve Outcome: Progressing Goal: Cardiovascular complication will be avoided Outcome: Progressing   Problem: Activity: Goal: Risk for activity intolerance will decrease Outcome: Progressing   Problem: Nutrition: Goal: Adequate nutrition will be maintained Outcome: Progressing   Problem: Coping: Goal: Level of anxiety will decrease Outcome: Progressing   Problem: Elimination: Goal: Will not experience complications related to bowel motility Outcome: Progressing Goal: Will not experience complications related to urinary retention Outcome: Progressing   Problem: Pain Managment: Goal: General experience of comfort will improve Outcome: Progressing   Problem: Safety: Goal: Ability to remain free from injury will improve Outcome: Progressing   Problem: Skin Integrity: Goal: Risk for impaired skin integrity will decrease Outcome: Progressing   Problem: Education: Goal: Knowledge of General Education information will improve Description: Including pain rating scale, medication(s)/side effects and non-pharmacologic comfort measures Outcome: Progressing   Problem: Health Behavior/Discharge Planning: Goal: Ability to manage health-related needs will improve Outcome: Progressing   Problem: Clinical Measurements: Goal: Ability to maintain  clinical measurements within normal limits will improve Outcome: Progressing Goal: Will remain free from infection Outcome: Progressing Goal: Diagnostic test results will improve Outcome: Progressing Goal: Respiratory complications will improve Outcome: Progressing Goal: Cardiovascular complication will be avoided Outcome: Progressing   Problem: Activity: Goal: Risk for activity intolerance will decrease Outcome: Progressing   Problem: Nutrition: Goal: Adequate nutrition will be maintained Outcome: Progressing   Problem: Coping: Goal: Level of anxiety will decrease Outcome: Progressing   Problem: Elimination: Goal: Will not experience complications related to bowel motility Outcome: Progressing Goal: Will not experience complications related to urinary retention Outcome: Progressing   Problem: Pain Managment: Goal: General experience of comfort will improve Outcome: Progressing   Problem: Safety: Goal: Ability to remain free from injury will improve Outcome: Progressing   Problem: Skin Integrity: Goal: Risk for impaired skin integrity will decrease Outcome: Progressing   Problem: Activity: Goal: Ability to tolerate increased activity will improve Outcome: Progressing   Problem: Respiratory: Goal: Ability to maintain a clear airway and adequate ventilation will improve Outcome: Progressing   Problem: Role Relationship: Goal: Method of communication will improve Outcome: Progressing   Problem: Education: Goal: Ability to describe self-care measures that may prevent or decrease complications (Diabetes Survival Skills Education) will improve Outcome: Progressing Goal: Individualized Educational Video(s) Outcome: Progressing   Problem: Coping: Goal: Ability to adjust to condition or change in health will improve Outcome: Progressing   Problem: Fluid Volume: Goal: Ability to maintain a balanced intake and output will improve Outcome: Progressing   Problem:  Health Behavior/Discharge Planning: Goal: Ability to identify and utilize available resources and services will improve Outcome: Progressing Goal: Ability to manage health-related needs will improve Outcome: Progressing   Problem: Metabolic: Goal: Ability to maintain appropriate glucose levels will improve Outcome: Progressing   Problem: Nutritional: Goal: Maintenance of adequate nutrition will improve Outcome: Progressing Goal: Progress toward achieving an optimal weight will improve Outcome: Progressing   Problem: Skin Integrity: Goal: Risk for impaired skin integrity will decrease Outcome: Progressing

## 2021-09-17 NOTE — Progress Notes (Signed)
Occupational Therapy Note Please keep R foot in [place] when in bed with kick stand toward L/middle of bed. Position R foot in dorsiflexion against foot plate and fasten foot and calf straps snuggle. Position hip abduction wedge  to prevent internal rotation and adduction of the LE. If pt is using the abduction wedge, he does not need the KI. Educated sister on positioning. It is OK for the sister to remove the wedge and have [name] move his leg as needed as long as he is adhering to hip precautions to reduce restlessness. Will follow.   Media Information        Document Information   Photos  Position foot in Pra[place]. Secure hip abduction wedge to prevent internal rotation and addiction of the RLE   09/17/2021 13:48  Attached To:  Hospital Encounter on 08/05/21   Source Information  Carleta Woodrow, Lorinda Creed, OT  [facility]  [name], OT/L   Acute OT Clinical Specialist Acute Rehabilitation Services Pager [phone] Office [phone]
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
## 2021-09-17 NOTE — Progress Notes (Signed)
Ortho trauma follow up note  Discussed with OT  Now pt able to mobilize more it appears he has no active R shoulder motion and weak motor distally to R UEx Pt did have degloving injury to R UEx without fracture  Given findings and mechanism of injury will obtain MRI to eval for brachial plexus injury  Continue with therapies   Mearl Latin, PA-C [phone] (C) 09/17/2021, 3:17 PM  [facility] [address] [place] [address] [phone] (F)      Patient ID: Jeff [name], male   DOB: 29-May-2000, 21 y.o.   MRN: [id number]
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
## 2021-09-18 ENCOUNTER — Encounter (HOSPITAL_COMMUNITY)
Admission: EM | Disposition: A | Discharge status: Other Acute Inpatient Hospital | Payer: Self-pay | Source: Home / Self Care

## 2021-09-18 ENCOUNTER — Encounter (HOSPITAL_COMMUNITY): Payer: Self-pay | Admitting: Pulmonary Disease

## 2021-09-18 ENCOUNTER — Inpatient Hospital Stay (HOSPITAL_COMMUNITY): Payer: Medicaid Other

## 2021-09-18 ENCOUNTER — Other Ambulatory Visit: Payer: Self-pay

## 2021-09-18 ENCOUNTER — Inpatient Hospital Stay (HOSPITAL_COMMUNITY): Payer: Medicaid Other | Admitting: Anesthesiology

## 2021-09-18 DIAGNOSIS — F17210 Nicotine dependence, cigarettes, uncomplicated: Secondary | ICD-10-CM

## 2021-09-18 DIAGNOSIS — S79921A Unspecified injury of right thigh, initial encounter: Secondary | ICD-10-CM | POA: Diagnosis not present

## 2021-09-18 DIAGNOSIS — D649 Anemia, unspecified: Secondary | ICD-10-CM | POA: Diagnosis not present

## 2021-09-18 DIAGNOSIS — T07XXXA Unspecified multiple injuries, initial encounter: Secondary | ICD-10-CM | POA: Diagnosis not present

## 2021-09-18 DIAGNOSIS — J9621 Acute and chronic respiratory failure with hypoxia: Secondary | ICD-10-CM | POA: Diagnosis not present

## 2021-09-18 DIAGNOSIS — J9601 Acute respiratory failure with hypoxia: Secondary | ICD-10-CM | POA: Diagnosis not present

## 2021-09-18 DIAGNOSIS — G9341 Metabolic encephalopathy: Secondary | ICD-10-CM | POA: Diagnosis not present

## 2021-09-18 HISTORY — PX: APPLICATION OF WOUND VAC: SHX5189

## 2021-09-18 HISTORY — PX: I & D EXTREMITY: SHX5045

## 2021-09-18 SURGERY — IRRIGATION AND DEBRIDEMENT EXTREMITY
Anesthesia: General | Site: Thigh | Laterality: Right

## 2021-09-18 MED ORDER — AMISULPRIDE (ANTIEMETIC) 5 MG/2ML IV SOLN: 10.0000 mg | Freq: Once | INTRAVENOUS | Status: DC | PRN

## 2021-09-18 MED ORDER — ROCURONIUM BROMIDE 10 MG/ML (PF) SYRINGE
PREFILLED_SYRINGE | INTRAVENOUS | Status: DC | PRN
  Administered 2021-09-18 (×2): 10 mg via INTRAVENOUS
  Administered 2021-09-18: 40 mg via INTRAVENOUS

## 2021-09-18 MED ORDER — PROPOFOL 10 MG/ML IV BOLUS
INTRAVENOUS | Status: DC | PRN
  Administered 2021-09-18 (×2): 20 mg via INTRAVENOUS
  Administered 2021-09-18: 40 mg via INTRAVENOUS

## 2021-09-18 MED ORDER — SUGAMMADEX SODIUM 200 MG/2ML IV SOLN
INTRAVENOUS | Status: DC | PRN
  Administered 2021-09-18 (×2): 100 mg via INTRAVENOUS

## 2021-09-18 MED ORDER — 0.9 % SODIUM CHLORIDE (POUR BTL) OPTIME
TOPICAL | Status: DC | PRN
  Administered 2021-09-18: 1000 mL

## 2021-09-18 MED ORDER — PHENYLEPHRINE 80 MCG/ML (10ML) SYRINGE FOR IV PUSH (FOR BLOOD PRESSURE SUPPORT)
PREFILLED_SYRINGE | INTRAVENOUS | Status: DC | PRN
  Administered 2021-09-18 (×2): 80 ug via INTRAVENOUS

## 2021-09-18 MED ORDER — CHLORHEXIDINE GLUCONATE 0.12 % MT SOLN: 15.0000 mL | Freq: Once | OROMUCOSAL | Status: DC

## 2021-09-18 MED ORDER — LIDOCAINE 2% (20 MG/ML) 5 ML SYRINGE
INTRAMUSCULAR | Status: AC
  Filled 2021-09-18: qty 10

## 2021-09-18 MED ORDER — ONDANSETRON HCL 4 MG/2ML IJ SOLN
INTRAMUSCULAR | Status: AC
  Filled 2021-09-18: qty 2

## 2021-09-18 MED ORDER — ORAL CARE MOUTH RINSE: 15.0000 mL | Freq: Once | OROMUCOSAL | Status: DC

## 2021-09-18 MED ORDER — MINERAL OIL LIGHT 100 % EX OIL
TOPICAL_OIL | CUTANEOUS | Status: DC | PRN
  Administered 2021-09-18: 1 via TOPICAL

## 2021-09-18 MED ORDER — MIDAZOLAM HCL 2 MG/2ML IJ SOLN
INTRAMUSCULAR | Status: DC | PRN
  Administered 2021-09-18 (×2): 1 mg via INTRAVENOUS

## 2021-09-18 MED ORDER — ROCURONIUM BROMIDE 10 MG/ML (PF) SYRINGE
PREFILLED_SYRINGE | INTRAVENOUS | Status: AC
  Filled 2021-09-18: qty 10

## 2021-09-18 MED ORDER — CEFAZOLIN SODIUM-DEXTROSE 2-4 GM/100ML-% IV SOLN
2.0000 g | Freq: Three times a day (TID) | INTRAVENOUS | Status: AC
  Administered 2021-09-18 – 2021-09-19 (×3): 2 g via INTRAVENOUS
  Filled 2021-09-18 (×3): qty 100

## 2021-09-18 MED ORDER — PROPOFOL 10 MG/ML IV BOLUS
INTRAVENOUS | Status: AC
  Filled 2021-09-18: qty 20

## 2021-09-18 MED ORDER — DEXAMETHASONE SODIUM PHOSPHATE 10 MG/ML IJ SOLN
INTRAMUSCULAR | Status: DC | PRN
  Administered 2021-09-18: 4 mg via INTRAVENOUS

## 2021-09-18 MED ORDER — JUVEN PO PACK
1.0000 | PACK | Freq: Two times a day (BID) | ORAL | Status: DC
  Administered 2021-09-19 (×2): 1 via ORAL
  Filled 2021-09-18 (×2): qty 1

## 2021-09-18 MED ORDER — LACTATED RINGERS IV SOLN: INTRAVENOUS | Status: DC

## 2021-09-18 MED ORDER — HYDROMORPHONE HCL 1 MG/ML IJ SOLN: 0.2500 mg | INTRAMUSCULAR | Status: DC | PRN

## 2021-09-18 MED ORDER — FENTANYL CITRATE (PF) 250 MCG/5ML IJ SOLN
INTRAMUSCULAR | Status: AC
  Filled 2021-09-18: qty 5

## 2021-09-18 MED ORDER — ONDANSETRON HCL 4 MG/2ML IJ SOLN
INTRAMUSCULAR | Status: DC | PRN
  Administered 2021-09-18: 4 mg via INTRAVENOUS

## 2021-09-18 MED ORDER — SUCCINYLCHOLINE CHLORIDE 200 MG/10ML IV SOSY
PREFILLED_SYRINGE | INTRAVENOUS | Status: AC
  Filled 2021-09-18: qty 10

## 2021-09-18 MED ORDER — FENTANYL CITRATE (PF) 250 MCG/5ML IJ SOLN
INTRAMUSCULAR | Status: DC | PRN
  Administered 2021-09-18 (×3): 50 ug via INTRAVENOUS

## 2021-09-18 MED ORDER — DEXAMETHASONE SODIUM PHOSPHATE 10 MG/ML IJ SOLN
INTRAMUSCULAR | Status: AC
  Filled 2021-09-18: qty 1

## 2021-09-18 MED ORDER — PROSOURCE PLUS PO LIQD
30.0000 mL | Freq: Two times a day (BID) | ORAL | Status: DC
  Administered 2021-09-19 (×2): 30 mL via ORAL
  Filled 2021-09-18 (×2): qty 30

## 2021-09-18 MED ORDER — MIDAZOLAM HCL 2 MG/2ML IJ SOLN
INTRAMUSCULAR | Status: AC
  Filled 2021-09-18: qty 2

## 2021-09-18 SURGICAL SUPPLY — 77 items
BAG COUNTER SPONGE SURGICOUNT (BAG) ×3 IMPLANT
BAG SPNG CNTER NS LX DISP (BAG) ×3
BALL CTTN LRG ABS STRL LF (GAUZE/BANDAGES/DRESSINGS) ×6
BANDAGE ACE 6X5 VEL STRL LF (GAUZE/BANDAGES/DRESSINGS) ×1 IMPLANT
BLADE CLIPPER SURG (BLADE) IMPLANT
BLADE DERMATOME SS (BLADE) ×1 IMPLANT
BNDG CMPR 5X6 CHSV STRCH STRL (GAUZE/BANDAGES/DRESSINGS) ×3
BNDG COHESIVE 4X5 TAN STRL (GAUZE/BANDAGES/DRESSINGS) ×3 IMPLANT
BNDG COHESIVE 6X5 TAN ST LF (GAUZE/BANDAGES/DRESSINGS) ×1 IMPLANT
BNDG ELASTIC 6X5.8 VLCR STR LF (GAUZE/BANDAGES/DRESSINGS) ×6 IMPLANT
BNDG GAUZE ELAST 4 BULKY (GAUZE/BANDAGES/DRESSINGS) ×6 IMPLANT
BRUSH SCRUB EZ PLAIN DRY (MISCELLANEOUS) ×6 IMPLANT
CANISTER SUCT 3000ML PPV (MISCELLANEOUS) IMPLANT
CANISTER WOUND CARE 500ML ATS (WOUND CARE) ×1 IMPLANT
COTTONBALL LRG STERILE PKG (GAUZE/BANDAGES/DRESSINGS) ×6 IMPLANT
COVER SURGICAL LIGHT HANDLE (MISCELLANEOUS) ×6 IMPLANT
DERMACARRIERS GRAFT 1 TO 1.5 (DISPOSABLE) ×3
DRAPE DERMATAC (DRAPES) ×2 IMPLANT
DRAPE HALF SHEET 40X57 (DRAPES) ×6 IMPLANT
DRAPE INCISE IOBAN 66X45 STRL (DRAPES) ×1 IMPLANT
DRAPE ORTHO SPLIT 77X108 STRL (DRAPES) ×3
DRAPE SURG ORHT 6 SPLT 77X108 (DRAPES) ×3 IMPLANT
DRAPE U-SHAPE 47X51 STRL (DRAPES) ×3 IMPLANT
DRSG ADAPTIC 3X8 NADH LF (GAUZE/BANDAGES/DRESSINGS) ×3 IMPLANT
DRSG MEPITEL 8X12 (GAUZE/BANDAGES/DRESSINGS) ×1 IMPLANT
DRSG PAD ABDOMINAL 8X10 ST (GAUZE/BANDAGES/DRESSINGS) ×6 IMPLANT
DRSG VAC ATS LRG SENSATRAC (GAUZE/BANDAGES/DRESSINGS) ×1 IMPLANT
ELECT REM PT RETURN 9FT ADLT (ELECTROSURGICAL)
ELECTRODE REM PT RTRN 9FT ADLT (ELECTROSURGICAL) IMPLANT
GAUZE 4X4 16PLY ~~LOC~~+RFID DBL (SPONGE) ×3 IMPLANT
GAUZE SPONGE 4X4 12PLY STRL (GAUZE/BANDAGES/DRESSINGS) ×6 IMPLANT
GAUZE XEROFORM 5X9 LF (GAUZE/BANDAGES/DRESSINGS) IMPLANT
GLOVE BIO SURGEON STRL SZ7.5 (GLOVE) ×3 IMPLANT
GLOVE BIO SURGEON STRL SZ8 (GLOVE) ×3 IMPLANT
GLOVE BIOGEL PI IND STRL 7.5 (GLOVE) ×3 IMPLANT
GLOVE BIOGEL PI IND STRL 8 (GLOVE) ×3 IMPLANT
GLOVE BIOGEL PI IND STRL 9 (GLOVE) ×3 IMPLANT
GLOVE BIOGEL PI INDICATOR 7.5 (GLOVE) ×3
GLOVE BIOGEL PI INDICATOR 8 (GLOVE) ×3
GLOVE BIOGEL PI INDICATOR 9 (GLOVE) ×3
GLOVE SURG ORTHO LTX SZ7.5 (GLOVE) ×6 IMPLANT
GOWN STRL REUS W/ TWL LRG LVL3 (GOWN DISPOSABLE) ×6 IMPLANT
GOWN STRL REUS W/ TWL XL LVL3 (GOWN DISPOSABLE) ×3 IMPLANT
GOWN STRL REUS W/TWL LRG LVL3 (GOWN DISPOSABLE) ×6
GOWN STRL REUS W/TWL XL LVL3 (GOWN DISPOSABLE) ×3
GRAFT DERMACARRIERS 1 TO 1.5 (DISPOSABLE) ×3 IMPLANT
HANDPIECE INTERPULSE COAX TIP (DISPOSABLE)
IMMOBILIZER KNEE 20 (SOFTGOODS) ×3
IMMOBILIZER KNEE 20 THIGH 36 (SOFTGOODS) ×1 IMPLANT
KIT BASIN OR (CUSTOM PROCEDURE TRAY) ×3 IMPLANT
KIT TURNOVER KIT B (KITS) ×3 IMPLANT
MANIFOLD NEPTUNE II (INSTRUMENTS) ×3 IMPLANT
NS IRRIG 1000ML POUR BTL (IV SOLUTION) ×3 IMPLANT
PACK GENERAL/GYN (CUSTOM PROCEDURE TRAY) ×3 IMPLANT
PACK ORTHO EXTREMITY (CUSTOM PROCEDURE TRAY) ×3 IMPLANT
PAD ARMBOARD 7.5X6 YLW CONV (MISCELLANEOUS) ×6 IMPLANT
PAD CAST 4YDX4 CTTN HI CHSV (CAST SUPPLIES) ×3 IMPLANT
PADDING CAST COTTON 4X4 STRL (CAST SUPPLIES) ×3
PADDING CAST COTTON 6X4 STRL (CAST SUPPLIES) ×3 IMPLANT
SET HNDPC FAN SPRY TIP SCT (DISPOSABLE) IMPLANT
SOL PREP POV-IOD 4OZ 10% (MISCELLANEOUS) ×3 IMPLANT
SOL SCRUB PVP POV-IOD 4OZ 7.5% (MISCELLANEOUS) ×3
SOLUTION SCRB POV-IOD 4OZ 7.5% (MISCELLANEOUS) ×3 IMPLANT
SPONGE T-LAP 18X18 ~~LOC~~+RFID (SPONGE) ×3 IMPLANT
STAPLER VISISTAT (STAPLE) ×3 IMPLANT
STAPLER VISISTAT 35W (STAPLE) ×3 IMPLANT
STOCKINETTE 4X48 STRL (DRAPES) ×3 IMPLANT
STOCKINETTE IMPERVIOUS 9X36 MD (GAUZE/BANDAGES/DRESSINGS) IMPLANT
STOCKINETTE IMPERVIOUS LG (DRAPES) ×2 IMPLANT
SUT ETHILON 3 0 FSL (SUTURE) IMPLANT
SUT PDS AB 2-0 CT1 27 (SUTURE) IMPLANT
TOWEL GREEN STERILE (TOWEL DISPOSABLE) ×6 IMPLANT
TOWEL GREEN STERILE FF (TOWEL DISPOSABLE) ×3 IMPLANT
TUBE CONNECTING 12X1/4 (SUCTIONS) ×3 IMPLANT
UNDERPAD 30X36 HEAVY ABSORB (UNDERPADS AND DIAPERS) ×3 IMPLANT
WATER STERILE IRR 1000ML POUR (IV SOLUTION) ×3 IMPLANT
YANKAUER SUCT BULB TIP NO VENT (SUCTIONS) ×3 IMPLANT

## 2021-09-18 NOTE — Progress Notes (Signed)
The risks and benefits of surgery for split thickness skin grafting, including the possibility of infection, nerve injury, vessel injury, wound breakdown, DVT/ PE, loss of motion, malunion, nonunion, and need for further surgery among others.  These risks were acknowledged and consent provided to proceed.  Myrene Galas, MD [facility], [facility] [phone]

## 2021-09-18 NOTE — Anesthesia Procedure Notes (Signed)
Procedure Name: Intubation Date/Time: 09/18/2021 8:36 AM  Performed by: Kaylyn Layer, MDPre-anesthesia Checklist: Patient identified, Emergency Drugs available, Suction available and Patient being monitored Patient Re-evaluated:Patient Re-evaluated prior to induction Oxygen Delivery Method: Circle system utilized Induction Type: IV induction Tube type: Reinforced Tube size: 6.5 mm Number of attempts: 1 Placement Confirmation: positive ETCO2 Secured at: 13 cm Tube secured with: Tape Dental Injury: Teeth and Oropharynx as per pre-operative assessment

## 2021-09-18 NOTE — Progress Notes (Signed)
  Inpatient Rehabilitation Admissions Coordinator  I have begun appeal and requested peer to peer with [facility] for requested Cir admit. Dr Riley Kill to complete.  Ottie Glazier, RN, MSN Rehab Admissions Coordinator [phone] 09/18/2021 1:32 PM

## 2021-09-18 NOTE — Progress Notes (Signed)
Patient seen today by trach team for consult.  No education is needed at this time.  All necessary equipment is at beside.   Will continue to follow for progression.  

## 2021-09-18 NOTE — Anesthesia Postprocedure Evaluation (Signed)
Anesthesia Post Note  Patient: Jeff [name]  Procedure(s) Performed: IRRIGATION AND DEBRIDEMENT EXTREMITY (Right: Leg Upper) APPLICATION OF WOUND VAC (Right: Thigh)     Patient location during evaluation: PACU Anesthesia Type: General Level of consciousness: awake and alert Pain management: pain level controlled Vital Signs Assessment: post-procedure vital signs reviewed and stable Respiratory status: spontaneous breathing, nonlabored ventilation and respiratory function stable Cardiovascular status: blood pressure returned to baseline Postop Assessment: no apparent nausea or vomiting Anesthetic complications: no   No notable events documented.  Last Vitals:  Vitals:   09/18/21 1035 09/18/21 1050  BP: 128/83 133/82  Pulse: 99 100  Resp: 17 18  Temp:  36.4 C  SpO2: 96% 98%    Last Pain:  Vitals:   09/17/21 2357  TempSrc: Oral  PainSc:         RLE Motor Response: Purposeful movement (09/18/21 1050)        Shanda Howells

## 2021-09-18 NOTE — Plan of Care (Signed)
  Problem: Clinical Measurements: Goal: Ability to maintain clinical measurements within normal limits will improve Outcome: Progressing Goal: Will remain free from infection Outcome: Progressing Goal: Diagnostic test results will improve Outcome: Progressing Goal: Respiratory complications will improve Outcome: Progressing Goal: Cardiovascular complication will be avoided Outcome: Progressing   Problem: Activity: Goal: Risk for activity intolerance will decrease Outcome: Progressing   Problem: Nutrition: Goal: Adequate nutrition will be maintained Outcome: Progressing   Problem: Elimination: Goal: Will not experience complications related to bowel motility Outcome: Progressing Goal: Will not experience complications related to urinary retention Outcome: Progressing   Problem: Pain Managment: Goal: General experience of comfort will improve Outcome: Progressing   Problem: Safety: Goal: Ability to remain free from injury will improve Outcome: Progressing   Problem: Skin Integrity: Goal: Risk for impaired skin integrity will decrease Outcome: Progressing   Problem: Clinical Measurements: Goal: Ability to maintain clinical measurements within normal limits will improve Outcome: Progressing Goal: Will remain free from infection Outcome: Progressing Goal: Diagnostic test results will improve Outcome: Progressing Goal: Respiratory complications will improve Outcome: Progressing Goal: Cardiovascular complication will be avoided Outcome: Progressing   Problem: Activity: Goal: Risk for activity intolerance will decrease Outcome: Progressing   Problem: Nutrition: Goal: Adequate nutrition will be maintained Outcome: Progressing   Problem: Elimination: Goal: Will not experience complications related to bowel motility Outcome: Progressing Goal: Will not experience complications related to urinary retention Outcome: Progressing   Problem: Pain Managment: Goal:  General experience of comfort will improve Outcome: Progressing   Problem: Safety: Goal: Ability to remain free from injury will improve Outcome: Progressing   Problem: Skin Integrity: Goal: Risk for impaired skin integrity will decrease Outcome: Progressing   Problem: Activity: Goal: Ability to tolerate increased activity will improve Outcome: Progressing   Problem: Respiratory: Goal: Ability to maintain a clear airway and adequate ventilation will improve Outcome: Progressing   Problem: Role Relationship: Goal: Method of communication will improve Outcome: Progressing   Problem: Education: Goal: Ability to describe self-care measures that may prevent or decrease complications (Diabetes Survival Skills Education) will improve Outcome: Progressing Goal: Individualized Educational Video(s) Outcome: Progressing   Problem: Coping: Goal: Ability to adjust to condition or change in health will improve Outcome: Progressing   Problem: Fluid Volume: Goal: Ability to maintain a balanced intake and output will improve Outcome: Progressing   Problem: Health Behavior/Discharge Planning: Goal: Ability to identify and utilize available resources and services will improve Outcome: Progressing Goal: Ability to manage health-related needs will improve Outcome: Progressing   Problem: Metabolic: Goal: Ability to maintain appropriate glucose levels will improve Outcome: Progressing   Problem: Nutritional: Goal: Maintenance of adequate nutrition will improve Outcome: Progressing Goal: Progress toward achieving an optimal weight will improve Outcome: Progressing   Problem: Skin Integrity: Goal: Risk for impaired skin integrity will decrease Outcome: Progressing

## 2021-09-18 NOTE — Progress Notes (Signed)
Patient OTF in OR will assess patient when arrives back to room.

## 2021-09-18 NOTE — Transfer of Care (Signed)
Immediate Anesthesia Transfer of Care Note  Patient: Jeff [name]  Procedure(s) Performed: IRRIGATION AND DEBRIDEMENT EXTREMITY (Right: Leg Upper) SKIN GRAFT SPLIT THICKNESS (Right: Leg Upper)  Patient Location: PACU  Anesthesia Type:General  Level of Consciousness: drowsy and patient cooperative  Airway & Oxygen Therapy: Patient Spontanous Breathing and Patient connected to face mask oxygen  Post-op Assessment: Report given to RN and Post -op Vital signs reviewed and stable  Post vital signs: Reviewed and stable  Last Vitals:  Vitals Value Taken Time  BP 129/84 09/18/21 1020  Temp 36.3 C 09/18/21 1020  Pulse 99 09/18/21 1026  Resp 17 09/18/21 1026  SpO2 99 % 09/18/21 1026  Vitals shown include unvalidated device data.  Last Pain:  Vitals:   09/17/21 2357  TempSrc: Oral  PainSc:       Patients Stated Pain Goal: 0 (08/30/21 2141)  Complications: No notable events documented.
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
## 2021-09-18 NOTE — Progress Notes (Signed)
Telephone consent received from the dad [place] [phone], and witnessed by two nurses.

## 2021-09-19 ENCOUNTER — Encounter (HOSPITAL_COMMUNITY): Payer: Self-pay | Admitting: Orthopedic Surgery

## 2021-09-19 ENCOUNTER — Encounter (HOSPITAL_COMMUNITY): Payer: Self-pay | Admitting: Physical Medicine & Rehabilitation

## 2021-09-19 ENCOUNTER — Encounter (HOSPITAL_COMMUNITY): Payer: Self-pay | Admitting: Emergency Medicine

## 2021-09-19 ENCOUNTER — Inpatient Hospital Stay (HOSPITAL_COMMUNITY)
Admission: RE | Admit: 2021-09-19 | Discharge: 2021-10-11 | DRG: 940 | Disposition: A | Discharge status: Home / Self Care | Payer: Medicaid Other | Source: Intra-hospital | Attending: Physical Medicine & Rehabilitation | Admitting: Physical Medicine & Rehabilitation

## 2021-09-19 DIAGNOSIS — S42402D Unspecified fracture of lower end of left humerus, subsequent encounter for fracture with routine healing: Secondary | ICD-10-CM

## 2021-09-19 DIAGNOSIS — S27329D Contusion of lung, unspecified, subsequent encounter: Secondary | ICD-10-CM

## 2021-09-19 DIAGNOSIS — G479 Sleep disorder, unspecified: Secondary | ICD-10-CM | POA: Diagnosis not present

## 2021-09-19 DIAGNOSIS — S300XXD Contusion of lower back and pelvis, subsequent encounter: Secondary | ICD-10-CM

## 2021-09-19 DIAGNOSIS — S62201D Unspecified fracture of first metacarpal bone, right hand, subsequent encounter for fracture with routine healing: Secondary | ICD-10-CM

## 2021-09-19 DIAGNOSIS — D72829 Elevated white blood cell count, unspecified: Secondary | ICD-10-CM | POA: Diagnosis not present

## 2021-09-19 DIAGNOSIS — S2242XD Multiple fractures of ribs, left side, subsequent encounter for fracture with routine healing: Secondary | ICD-10-CM

## 2021-09-19 DIAGNOSIS — R4689 Other symptoms and signs involving appearance and behavior: Secondary | ICD-10-CM

## 2021-09-19 DIAGNOSIS — S069X0S Unspecified intracranial injury without loss of consciousness, sequela: Secondary | ICD-10-CM | POA: Diagnosis not present

## 2021-09-19 DIAGNOSIS — G9341 Metabolic encephalopathy: Secondary | ICD-10-CM | POA: Diagnosis not present

## 2021-09-19 DIAGNOSIS — S069XAA Unspecified intracranial injury with loss of consciousness status unknown, initial encounter: Secondary | ICD-10-CM | POA: Diagnosis present

## 2021-09-19 DIAGNOSIS — S73004D Unspecified dislocation of right hip, subsequent encounter: Secondary | ICD-10-CM | POA: Diagnosis not present

## 2021-09-19 DIAGNOSIS — M79604 Pain in right leg: Secondary | ICD-10-CM | POA: Diagnosis not present

## 2021-09-19 DIAGNOSIS — S41111D Laceration without foreign body of right upper arm, subsequent encounter: Secondary | ICD-10-CM | POA: Diagnosis not present

## 2021-09-19 DIAGNOSIS — S32421D Displaced fracture of posterior wall of right acetabulum, subsequent encounter for fracture with routine healing: Secondary | ICD-10-CM

## 2021-09-19 DIAGNOSIS — K5903 Drug induced constipation: Secondary | ICD-10-CM | POA: Diagnosis not present

## 2021-09-19 DIAGNOSIS — Z930 Tracheostomy status: Secondary | ICD-10-CM | POA: Diagnosis not present

## 2021-09-19 DIAGNOSIS — S065X9D Traumatic subdural hemorrhage with loss of consciousness of unspecified duration, subsequent encounter: Secondary | ICD-10-CM | POA: Diagnosis not present

## 2021-09-19 DIAGNOSIS — T07XXXA Unspecified multiple injuries, initial encounter: Secondary | ICD-10-CM | POA: Diagnosis not present

## 2021-09-19 DIAGNOSIS — G540 Brachial plexus disorders: Secondary | ICD-10-CM | POA: Diagnosis present

## 2021-09-19 DIAGNOSIS — S069X3S Unspecified intracranial injury with loss of consciousness of 1 hour to 5 hours 59 minutes, sequela: Secondary | ICD-10-CM | POA: Diagnosis not present

## 2021-09-19 DIAGNOSIS — S73004A Unspecified dislocation of right hip, initial encounter: Secondary | ICD-10-CM | POA: Diagnosis not present

## 2021-09-19 DIAGNOSIS — Z79899 Other long term (current) drug therapy: Secondary | ICD-10-CM

## 2021-09-19 DIAGNOSIS — Z945 Skin transplant status: Secondary | ICD-10-CM | POA: Diagnosis not present

## 2021-09-19 DIAGNOSIS — R002 Palpitations: Secondary | ICD-10-CM | POA: Diagnosis not present

## 2021-09-19 DIAGNOSIS — S79921A Unspecified injury of right thigh, initial encounter: Secondary | ICD-10-CM | POA: Diagnosis not present

## 2021-09-19 DIAGNOSIS — R464 Slowness and poor responsiveness: Secondary | ICD-10-CM | POA: Diagnosis present

## 2021-09-19 DIAGNOSIS — D62 Acute posthemorrhagic anemia: Secondary | ICD-10-CM | POA: Diagnosis present

## 2021-09-19 DIAGNOSIS — S81809A Unspecified open wound, unspecified lower leg, initial encounter: Secondary | ICD-10-CM | POA: Diagnosis not present

## 2021-09-19 DIAGNOSIS — Z741 Need for assistance with personal care: Secondary | ICD-10-CM | POA: Diagnosis present

## 2021-09-19 DIAGNOSIS — S71109A Unspecified open wound, unspecified thigh, initial encounter: Secondary | ICD-10-CM | POA: Diagnosis not present

## 2021-09-19 DIAGNOSIS — S73004S Unspecified dislocation of right hip, sequela: Secondary | ICD-10-CM | POA: Diagnosis not present

## 2021-09-19 DIAGNOSIS — R451 Restlessness and agitation: Secondary | ICD-10-CM | POA: Diagnosis not present

## 2021-09-19 DIAGNOSIS — R519 Headache, unspecified: Secondary | ICD-10-CM | POA: Diagnosis not present

## 2021-09-19 DIAGNOSIS — J9621 Acute and chronic respiratory failure with hypoxia: Secondary | ICD-10-CM | POA: Diagnosis not present

## 2021-09-19 DIAGNOSIS — M24451 Recurrent dislocation, right hip: Secondary | ICD-10-CM | POA: Diagnosis present

## 2021-09-19 DIAGNOSIS — R41840 Attention and concentration deficit: Secondary | ICD-10-CM | POA: Diagnosis not present

## 2021-09-19 DIAGNOSIS — S069X3D Unspecified intracranial injury with loss of consciousness of 1 hour to 5 hours 59 minutes, subsequent encounter: Secondary | ICD-10-CM | POA: Diagnosis not present

## 2021-09-19 DIAGNOSIS — S069XAS Unspecified intracranial injury with loss of consciousness status unknown, sequela: Secondary | ICD-10-CM | POA: Diagnosis not present

## 2021-09-19 DIAGNOSIS — S069X9D Unspecified intracranial injury with loss of consciousness of unspecified duration, subsequent encounter: Secondary | ICD-10-CM | POA: Diagnosis not present

## 2021-09-19 DIAGNOSIS — S71101A Unspecified open wound, right thigh, initial encounter: Secondary | ICD-10-CM | POA: Diagnosis not present

## 2021-09-19 DIAGNOSIS — R1310 Dysphagia, unspecified: Secondary | ICD-10-CM | POA: Diagnosis present

## 2021-09-19 DIAGNOSIS — S143XXA Injury of brachial plexus, initial encounter: Secondary | ICD-10-CM | POA: Diagnosis present

## 2021-09-19 DIAGNOSIS — S143XXD Injury of brachial plexus, subsequent encounter: Secondary | ICD-10-CM

## 2021-09-19 DIAGNOSIS — S069XAD Unspecified intracranial injury with loss of consciousness status unknown, subsequent encounter: Secondary | ICD-10-CM | POA: Diagnosis not present

## 2021-09-19 DIAGNOSIS — S32401D Unspecified fracture of right acetabulum, subsequent encounter for fracture with routine healing: Secondary | ICD-10-CM | POA: Diagnosis not present

## 2021-09-19 DIAGNOSIS — K5900 Constipation, unspecified: Secondary | ICD-10-CM | POA: Diagnosis present

## 2021-09-19 DIAGNOSIS — G5632 Lesion of radial nerve, left upper limb: Secondary | ICD-10-CM | POA: Diagnosis present

## 2021-09-19 DIAGNOSIS — S069X0D Unspecified intracranial injury without loss of consciousness, subsequent encounter: Secondary | ICD-10-CM | POA: Diagnosis not present

## 2021-09-19 DIAGNOSIS — D649 Anemia, unspecified: Secondary | ICD-10-CM | POA: Diagnosis not present

## 2021-09-19 DIAGNOSIS — S71001D Unspecified open wound, right hip, subsequent encounter: Secondary | ICD-10-CM | POA: Diagnosis not present

## 2021-09-19 DIAGNOSIS — S0636AS Traumatic hemorrhage of cerebrum, unspecified, with loss of consciousness status unknown, sequela: Secondary | ICD-10-CM | POA: Diagnosis present

## 2021-09-19 DIAGNOSIS — G4701 Insomnia due to medical condition: Secondary | ICD-10-CM | POA: Diagnosis not present

## 2021-09-19 DIAGNOSIS — L89611 Pressure ulcer of right heel, stage 1: Secondary | ICD-10-CM | POA: Diagnosis present

## 2021-09-19 DIAGNOSIS — Z87820 Personal history of traumatic brain injury: Secondary | ICD-10-CM

## 2021-09-19 DIAGNOSIS — S71101D Unspecified open wound, right thigh, subsequent encounter: Secondary | ICD-10-CM | POA: Diagnosis not present

## 2021-09-19 DIAGNOSIS — F17210 Nicotine dependence, cigarettes, uncomplicated: Secondary | ICD-10-CM | POA: Diagnosis present

## 2021-09-19 DIAGNOSIS — Z86718 Personal history of other venous thrombosis and embolism: Secondary | ICD-10-CM

## 2021-09-19 DIAGNOSIS — S270XXD Traumatic pneumothorax, subsequent encounter: Secondary | ICD-10-CM

## 2021-09-19 DIAGNOSIS — M79605 Pain in left leg: Secondary | ICD-10-CM | POA: Diagnosis not present

## 2021-09-19 HISTORY — DX: Injury of brachial plexus, initial encounter: S14.3XXA

## 2021-09-19 LAB — CBC
HCT: 34.4 % — ABNORMAL LOW (ref 39.0–52.0)
HCT: 38.2 % — ABNORMAL LOW (ref 39.0–52.0)
Hemoglobin: 11.3 g/dL — ABNORMAL LOW (ref 13.0–17.0)
Hemoglobin: 12.5 g/dL — ABNORMAL LOW (ref 13.0–17.0)
MCH: 30.5 pg (ref 26.0–34.0)
MCH: 30.3 pg (ref 26.0–34.0)
MCHC: 32.8 g/dL (ref 30.0–36.0)
MCHC: 32.7 g/dL (ref 30.0–36.0)
MCV: 92.7 fL (ref 80.0–100.0)
MCV: 92.5 fL (ref 80.0–100.0)
Platelets: 370 10*3/uL (ref 150–400)
Platelets: 401 10*3/uL — ABNORMAL HIGH (ref 150–400)
RBC: 3.71 MIL/uL — ABNORMAL LOW (ref 4.22–5.81)
RBC: 4.13 MIL/uL — ABNORMAL LOW (ref 4.22–5.81)
RDW: 14.6 % (ref 11.5–15.5)
RDW: 14.4 % (ref 11.5–15.5)
WBC: 6.6 10*3/uL (ref 4.0–10.5)
WBC: 11.5 10*3/uL — ABNORMAL HIGH (ref 4.0–10.5)
nRBC: 0 % (ref 0.0–0.2)
nRBC: 0 % (ref 0.0–0.2)

## 2021-09-19 LAB — BASIC METABOLIC PANEL
Anion gap: 3 — ABNORMAL LOW (ref 5–15)
BUN: 9 mg/dL (ref 6–20)
CO2: 27 mmol/L (ref 22–32)
Calcium: 8.5 mg/dL — ABNORMAL LOW (ref 8.9–10.3)
Chloride: 107 mmol/L (ref 98–111)
Creatinine, Ser: 0.58 mg/dL — ABNORMAL LOW (ref 0.61–1.24)
GFR, Estimated: >60 mL/min (ref >60)
Glucose, Bld: 104 mg/dL — ABNORMAL HIGH (ref 70–99)
Potassium: 3.5 mmol/L (ref 3.5–5.1)
Sodium: 137 mmol/L (ref 135–145)

## 2021-09-19 LAB — HEPARIN ANTI-XA: Heparin LMW: 0.88 IU/mL

## 2021-09-19 LAB — MAGNESIUM: Magnesium: 2.3 mg/dL (ref 1.7–2.4)

## 2021-09-19 LAB — CREATININE, SERUM
Creatinine, Ser: 0.63 mg/dL (ref 0.61–1.24)
GFR, Estimated: >60 mL/min (ref >60)

## 2021-09-19 MED ORDER — PANTOPRAZOLE SODIUM 40 MG PO PACK: 40.0000 mg | PACK | Freq: Every day | ORAL | Status: DC

## 2021-09-19 MED ORDER — DOCUSATE SODIUM 100 MG PO CAPS: 100.0000 mg | ORAL_CAPSULE | Freq: Two times a day (BID) | ORAL | 0 refills | Status: DC | PRN

## 2021-09-19 MED ORDER — VITAMIN D 25 MCG (1000 UNIT) PO TABS
1000.0000 [IU] | ORAL_TABLET | Freq: Every day | ORAL | Status: DC
  Administered 2021-09-20 – 2021-10-11 (×21): 1000 [IU] via ORAL
  Filled 2021-09-19 (×21): qty 1

## 2021-09-19 MED ORDER — CLONIDINE 0.1 MG/24HR TD PTWK: 0.1000 mg | MEDICATED_PATCH | TRANSDERMAL | 12 refills | Status: DC

## 2021-09-19 MED ORDER — ENOXAPARIN SODIUM 100 MG/ML IJ SOSY: 100.0000 mg | PREFILLED_SYRINGE | Freq: Two times a day (BID) | INTRAMUSCULAR | Status: DC

## 2021-09-19 MED ORDER — PROPRANOLOL HCL 10 MG PO TABS
40.0000 mg | ORAL_TABLET | Freq: Three times a day (TID) | ORAL | Status: DC
  Administered 2021-09-19 – 2021-09-27 (×22): 40 mg via ORAL
  Filled 2021-09-19 (×25): qty 4

## 2021-09-19 MED ORDER — PROSOURCE PLUS PO LIQD: 30.0000 mL | Freq: Two times a day (BID) | ORAL | Status: DC

## 2021-09-19 MED ORDER — ONDANSETRON HCL 4 MG PO TABS: 4.0000 mg | ORAL_TABLET | Freq: Four times a day (QID) | ORAL | Status: DC | PRN

## 2021-09-19 MED ORDER — GABAPENTIN 400 MG PO CAPS
400.0000 mg | ORAL_CAPSULE | Freq: Three times a day (TID) | ORAL | Status: DC
  Administered 2021-09-19 – 2021-10-01 (×32): 400 mg via ORAL
  Filled 2021-09-19 (×33): qty 1

## 2021-09-19 MED ORDER — TRAMADOL HCL 50 MG PO TABS: 50.0000 mg | ORAL_TABLET | Freq: Four times a day (QID) | ORAL | Status: DC | PRN

## 2021-09-19 MED ORDER — ONDANSETRON HCL 4 MG PO TABS: 4.0000 mg | ORAL_TABLET | Freq: Four times a day (QID) | ORAL | 0 refills | Status: DC | PRN

## 2021-09-19 MED ORDER — PROPRANOLOL HCL 40 MG PO TABS: 40.0000 mg | ORAL_TABLET | Freq: Three times a day (TID) | ORAL | Status: DC

## 2021-09-19 MED ORDER — POLYETHYLENE GLYCOL 3350 17 G PO PACK: 17.0000 g | PACK | Freq: Every day | ORAL | 0 refills | Status: DC | PRN

## 2021-09-19 MED ORDER — ONDANSETRON HCL 4 MG/2ML IJ SOLN
4.0000 mg | Freq: Four times a day (QID) | INTRAMUSCULAR | Status: DC | PRN
  Filled 2021-09-19: qty 2

## 2021-09-19 MED ORDER — ENSURE ENLIVE PO LIQD: 237.0000 mL | Freq: Two times a day (BID) | ORAL | 12 refills | Status: DC

## 2021-09-19 MED ORDER — MELATONIN 5 MG PO TABS
5.0000 mg | ORAL_TABLET | Freq: Every day | ORAL | Status: DC
  Administered 2021-09-19 – 2021-10-01 (×13): 5 mg via ORAL
  Filled 2021-09-19 (×13): qty 1

## 2021-09-19 MED ORDER — PANTOPRAZOLE 2 MG/ML SUSPENSION
40.0000 mg | Freq: Every day | ORAL | Status: DC
  Administered 2021-09-20 – 2021-09-22 (×3): 40 mg via ORAL
  Filled 2021-09-19 (×4): qty 20

## 2021-09-19 MED ORDER — SERTRALINE HCL 50 MG PO TABS: 50.0000 mg | ORAL_TABLET | Freq: Every day | ORAL | Status: DC

## 2021-09-19 MED ORDER — VITAMIN C 500 MG PO TABS
500.0000 mg | ORAL_TABLET | Freq: Two times a day (BID) | ORAL | Status: DC
  Administered 2021-09-19 – 2021-10-11 (×43): 500 mg via ORAL
  Filled 2021-09-19 (×44): qty 1

## 2021-09-19 MED ORDER — AMANTADINE HCL 100 MG PO CAPS: 100.0000 mg | ORAL_CAPSULE | Freq: Two times a day (BID) | ORAL | Status: DC

## 2021-09-19 MED ORDER — LOPERAMIDE HCL 2 MG PO CAPS: 2.0000 mg | ORAL_CAPSULE | ORAL | 0 refills | Status: DC | PRN

## 2021-09-19 MED ORDER — LOPERAMIDE HCL 2 MG PO CAPS: 2.0000 mg | ORAL_CAPSULE | ORAL | Status: DC | PRN

## 2021-09-19 MED ORDER — GABAPENTIN 400 MG PO CAPS: 400.0000 mg | ORAL_CAPSULE | Freq: Three times a day (TID) | ORAL | Status: DC

## 2021-09-19 MED ORDER — POLYETHYLENE GLYCOL 3350 17 G PO PACK: 17.0000 g | PACK | Freq: Every day | ORAL | Status: DC | PRN

## 2021-09-19 MED ORDER — TRAMADOL HCL 50 MG PO TABS
50.0000 mg | ORAL_TABLET | Freq: Four times a day (QID) | ORAL | Status: DC | PRN
  Administered 2021-09-19 – 2021-09-21 (×4): 50 mg
  Filled 2021-09-19 (×4): qty 1

## 2021-09-19 MED ORDER — ACETAMINOPHEN 325 MG PO TABS
325.0000 mg | ORAL_TABLET | ORAL | Status: DC | PRN
  Administered 2021-09-22 – 2021-10-05 (×19): 650 mg via ORAL
  Administered 2021-10-06: 325 mg via ORAL
  Administered 2021-10-06 – 2021-10-10 (×6): 650 mg via ORAL
  Filled 2021-09-19 (×28): qty 2

## 2021-09-19 MED ORDER — OXYCODONE HCL 5 MG PO TABS
10.0000 mg | ORAL_TABLET | ORAL | Status: DC | PRN
  Administered 2021-09-22 – 2021-09-26 (×5): 10 mg via ORAL
  Filled 2021-09-19 (×12): qty 2

## 2021-09-19 MED ORDER — JUVEN PO PACK
1.0000 | PACK | Freq: Two times a day (BID) | ORAL | Status: DC
  Administered 2021-09-21 – 2021-10-11 (×32): 1 via ORAL
  Filled 2021-09-19 (×28): qty 1

## 2021-09-19 MED ORDER — ACETAMINOPHEN 500 MG PO TABS: 500.0000 mg | ORAL_TABLET | Freq: Two times a day (BID) | ORAL | 0 refills | Status: DC

## 2021-09-19 MED ORDER — PROSOURCE PLUS PO LIQD
30.0000 mL | Freq: Two times a day (BID) | ORAL | Status: DC
  Administered 2021-09-21 – 2021-10-11 (×34): 30 mL via ORAL
  Filled 2021-09-19 (×31): qty 30

## 2021-09-19 MED ORDER — VITAMIN D3 25 MCG PO TABS: 1000.0000 [IU] | ORAL_TABLET | Freq: Every day | ORAL | Status: DC

## 2021-09-19 MED ORDER — CLONIDINE HCL 0.1 MG/24HR TD PTWK
0.1000 mg | MEDICATED_PATCH | TRANSDERMAL | Status: DC
  Administered 2021-09-20 – 2021-10-11 (×4): 0.1 mg via TRANSDERMAL
  Filled 2021-09-19 (×4): qty 1

## 2021-09-19 MED ORDER — AMANTADINE HCL 100 MG PO CAPS
100.0000 mg | ORAL_CAPSULE | Freq: Two times a day (BID) | ORAL | Status: DC
  Administered 2021-09-20 – 2021-09-24 (×7): 100 mg via ORAL
  Filled 2021-09-19 (×8): qty 1

## 2021-09-19 MED ORDER — ENSURE ENLIVE PO LIQD
237.0000 mL | Freq: Two times a day (BID) | ORAL | Status: DC
  Administered 2021-09-20 – 2021-10-10 (×22): 237 mL via ORAL
  Filled 2021-09-19 (×2): qty 237

## 2021-09-19 MED ORDER — SERTRALINE HCL 50 MG PO TABS
50.0000 mg | ORAL_TABLET | Freq: Every day | ORAL | Status: DC
  Administered 2021-09-19 – 2021-10-10 (×22): 50 mg via ORAL
  Filled 2021-09-19 (×22): qty 1

## 2021-09-19 MED ORDER — JUVEN PO PACK: 1.0000 | PACK | Freq: Two times a day (BID) | ORAL | 0 refills | Status: DC

## 2021-09-19 MED ORDER — ENOXAPARIN SODIUM 100 MG/ML IJ SOSY
100.0000 mg | PREFILLED_SYRINGE | Freq: Two times a day (BID) | INTRAMUSCULAR | Status: DC
  Administered 2021-09-19 – 2021-10-01 (×24): 100 mg via SUBCUTANEOUS
  Filled 2021-09-19 (×25): qty 1

## 2021-09-19 MED ORDER — MELATONIN 5 MG PO TABS: 5.0000 mg | ORAL_TABLET | Freq: Every day | ORAL | 0 refills | Status: DC

## 2021-09-19 MED ORDER — OXYCODONE HCL 10 MG PO TABS: 10.0000 mg | ORAL_TABLET | ORAL | 0 refills | Status: DC | PRN

## 2021-09-19 MED ORDER — ASCORBIC ACID 500 MG PO TABS: 500.0000 mg | ORAL_TABLET | Freq: Two times a day (BID) | ORAL | Status: DC

## 2021-09-19 MED ORDER — DOCUSATE SODIUM 100 MG PO CAPS: 100.0000 mg | ORAL_CAPSULE | Freq: Two times a day (BID) | ORAL | Status: DC | PRN
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
## 2021-09-19 NOTE — Discharge Summary (Signed)
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                                                        +---------+---------------+---------+-----------+----------+--------------+ FV DistalFull                                                        +---------+---------------+---------+-----------+----------+--------------+ PFV      None                    No  Acute          +---------+---------------+---------+-----------+----------+--------------+ POP      Full           Yes      Yes                                 +---------+---------------+---------+-----------+----------+--------------+ PTV      Full                    Yes                                 +---------+---------------+---------+-----------+----------+--------------+ PERO     None                    No                   Acute          +---------+---------------+---------+-----------+----------+--------------+   +---------+---------------+---------+-----------+----------+--------------+ LEFT      CompressibilityPhasicitySpontaneityPropertiesThrombus Aging +---------+---------------+---------+-----------+----------+--------------+ CFV      Full           No       Yes                                 +---------+---------------+---------+-----------+----------+--------------+ SFJ      Full                                                        +---------+---------------+---------+-----------+----------+--------------+ FV Prox  Full                                                        +---------+---------------+---------+-----------+----------+--------------+ FV Mid   Full                                                        +---------+---------------+---------+-----------+----------+--------------+ FV DistalFull                                                        +---------+---------------+---------+-----------+----------+--------------+ PFV      Full                                                        +---------+---------------+---------+-----------+----------+--------------+ POP      Full           No       Yes                                 +---------+---------------+---------+-----------+----------+--------------+  PTV      Full                    Yes                                 +---------+---------------+---------+-----------+----------+--------------+ PERO     Full                    Yes                                 +---------+---------------+---------+-----------+----------+--------------+     Summary: RIGHT: - Findings consistent with acute deep vein thrombosis involving the right femoral vein and right proximal profunda vein at the bifurcation, and right peroneal veins. - No cystic structure found in the popliteal fossa.  LEFT: - There is no evidence of deep vein thrombosis in the lower extremity.  - No cystic structure found in the popliteal fossa.  *See table(s) above for measurements and observations. Electronically  signed by Jamelle Haring on 08/28/2021 at 4:36:05 PM.    Final    VAS [place] UPPER EXTREMITY VENOUS DUPLEX  Result Date: 08/28/2021 UPPER VENOUS STUDY  Patient Name:  DEVUN ANNA [facility]  Date of Exam:   08/28/2021 Medical Rec #: [id number]              Accession #:    [id number] Date of Birth: [birth year]              Patient Gender: M Patient Age:   3 years Exam Location:  [facility] Procedure:      VAS [place] UPPER EXTREMITY VENOUS DUPLEX Referring Phys: Kipp Brood --------------------------------------------------------------------------------  Indications: fever Limitations: Tracheostomy collar. Comparison Study: No prior study Performing Technologist: Maudry Mayhew MHA, RDMS, RVT, RDCS  Examination Guidelines: A complete evaluation includes B-mode imaging, spectral Doppler, color Doppler, and power Doppler as needed of all accessible portions of each vessel. Bilateral testing is considered an integral part of a complete examination. Limited examinations for reoccurring indications may be performed as noted.  Right Findings: +----------+------------+---------+-----------+-----------------+--------------+ RIGHT     CompressiblePhasicitySpontaneous   Properties       Summary     +----------+------------+---------+-----------+-----------------+--------------+ IJV                                                        Not visualized +----------+------------+---------+-----------+-----------------+--------------+ Subclavian    Full       No        Yes                                    +----------+------------+---------+-----------+-----------------+--------------+ Axillary      Full       No        Yes                                    +----------+------------+---------+-----------+-----------------+--------------+ Brachial      Full       No        Yes                                    +----------+------------+---------+-----------+-----------------+--------------+  Radial        Full                                                        +----------+------------+---------+-----------+-----------------+--------------+ Ulnar         Full                                                        +----------+------------+---------+-----------+-----------------+--------------+ Cephalic      Full                                                        +----------+------------+---------+-----------+-----------------+--------------+ Basilic     Partial                Yes        partially         Age                                                   re-cannulated  Indeterminate  +----------+------------+---------+-----------+-----------------+--------------+  Left Findings: +----------+------------+---------+-----------+----------+--------------+ LEFT      CompressiblePhasicitySpontaneousProperties   Summary     +----------+------------+---------+-----------+----------+--------------+ IJV                                                 Not visualized +----------+------------+---------+-----------+----------+--------------+ Subclavian    Full       No        Yes                             +----------+------------+---------+-----------+----------+--------------+ Axillary      Full       No        Yes                             +----------+------------+---------+-----------+----------+--------------+ Brachial      Full       No        Yes                             +----------+------------+---------+-----------+----------+--------------+ Radial        Full                                                 +----------+------------+---------+-----------+----------+--------------+ Ulnar         Full                                                 +----------+------------+---------+-----------+----------+--------------+  Cephalic      Full                                                  +----------+------------+---------+-----------+----------+--------------+ Basilic       Full                                                 +----------+------------+---------+-----------+----------+--------------+  Summary:  Right: No evidence of deep vein thrombosis in the upper extremity. Findings consistent with age indeterminate superficial vein thrombosis involving the right basilic vein.  Left: No evidence of deep vein thrombosis in the upper extremity. No evidence of superficial vein thrombosis in the upper extremity.  Bilateral upper extremity pulsatile venous flow, suggestive of possibly elevated right heart pressure. *See table(s) above for measurements and observations.  Diagnosing physician: Jamelle Haring Electronically signed by Jamelle Haring on 08/28/2021 at 4:35:44 PM.    Final    DG CHEST PORT 1 VIEW  Result Date: 08/28/2021 CLINICAL DATA:  Status post bronchoscopy. EXAM: PORTABLE CHEST 1 VIEW COMPARISON:  Chest radiograph dated August 27, 2021 FINDINGS: The heart is normal in size. Bilateral diffuse ground-glass and nodular opacities, not significantly changed. Tracheostomy tube, right PICC and left-sided chest tube are unchanged. Feeding tube coursing below the diaphragm with distal tip not included. No appreciable pneumothorax or large pleural effusion. IMPRESSION: 1.  Lines and tubes as above and unchanged. 2.  Diffuse ground-glass and nodular opacities are unchanged. 3.  No appreciable pleural effusion or pneumothorax. Electronically Signed   By: Keane Police D.O.   On: 08/28/2021 13:49   DG Elbow 2 Views Left  Result Date: 08/28/2021 CLINICAL DATA:  Status post fixation of left elbow fracture. EXAM: LEFT ELBOW - 2 VIEW COMPARISON:  Radiograph 08/14/2021 FINDINGS: Stable fixation hardware involving the ulna. Good position and alignment of the fractures. No complicating features. IMPRESSION: Hardware in good position without complicating features. Electronically Signed   By: Marijo Sanes M.D.   On: 08/28/2021 11:43   DG Humerus Left  Result Date: 08/28/2021 CLINICAL DATA:  Status post fracture fixation of the distal humerus and elbow. EXAM: LEFT HUMERUS - 2+ VIEW COMPARISON:  Prior radiograph 08/14/2021 FINDINGS: Stable appearance of the long plate and multiple compression screws which are transfixing the mid distal humerus shaft fracture. No complicating features are identified. IMPRESSION: Internal fixation of mid distal humerus shaft fracture with good position and alignment without complicating features. Electronically Signed   By: Marijo Sanes M.D.   On: 08/28/2021 11:41   DG Hand Complete Right  Result Date: 08/28/2021 CLINICAL DATA:  RIGHT hand surgery EXAM: RIGHT HAND - COMPLETE 3+ VIEW COMPARISON:  Portable exam 1100 hours compared to intraoperative images of 08/07/2021 FINDINGS: 2 K-wires present at across comminuted displaced first metacarpal fracture post ORIF Shortening of first metacarpal. No additional fracture, dislocation, or bone destruction. IMPRESSION: Prior pinning of comminuted displaced RIGHT first metacarpal fracture as above. Electronically Signed   By: Lavonia Dana M.D.   On: 08/28/2021 11:34   DG Pelvis Comp Min 3V  Result Date: 08/28/2021 CLINICAL DATA:  Trauma EXAM: JUDET PELVIS - 3+ VIEW COMPARISON:  CT abdomen pelvis, 08/27/2021 FINDINGS: No significant change in radiographic appearance of fractures  of the posterior right acetabulum. No other fracture or dislocation of the pelvis or bilateral proximal femurs appreciated radiographically. Nonobstructive pattern of included bowel gas. IMPRESSION: No significant change in radiographic appearance of fractures of the posterior right acetabulum. No other fracture or dislocation of the pelvis or bilateral proximal femurs appreciated radiographically. Electronically Signed   By: Delanna Ahmadi M.D.   On: 08/28/2021 11:23   CT Angio Chest Pulmonary Embolism (PE) W or WO Contrast  Result Date:  08/27/2021 CLINICAL DATA:  Pulmonary embolus suspected. Unknown D-dimer. Postoperative abdominal pain. EXAM: CT ANGIOGRAPHY CHEST CT ABDOMEN AND PELVIS WITH CONTRAST TECHNIQUE: Multidetector CT imaging of the chest was performed using the standard protocol during bolus administration of intravenous contrast. Multiplanar CT image reconstructions and MIPs were obtained to evaluate the vascular anatomy. Multidetector CT imaging of the abdomen and pelvis was performed using the standard protocol during bolus administration of intravenous contrast. RADIATION DOSE REDUCTION: This exam was performed according to the departmental dose-optimization program which includes automated exposure control, adjustment of the mA and/or kV according to patient size and/or use of iterative reconstruction technique. CONTRAST:  61m OMNIPAQUE IOHEXOL 350 MG/ML SOLN COMPARISON:  CT chest abdomen and pelvis 08/10/2021. Chest radiograph 08/27/2021 FINDINGS: CTA CHEST FINDINGS Cardiovascular: Moderately good opacification of the central and proximal segmental pulmonary arteries. No focal filling defects are identified. No evidence of significant pulmonary embolus. Cardiac enlargement. No pericardial effusions. Normal caliber thoracic aorta. No aortic dissection. Great vessel origins are patent. Right central venous catheter with tip in the low SVC. Mediastinum/Nodes: Enteric tube is in place. Tracheostomy tube with tip above the carina. Esophagus is decompressed. No significant lymphadenopathy. Thyroid gland is unremarkable. Lungs/Pleura: Diffuse bilateral airspace and interstitial infiltrates, likely edema although multifocal pneumonia or ARDS could also have this appearance. A left chest tube is in place. No significant pneumothorax. Mild pleural thickening. Multiple fluid attenuation nodules or collections demonstrated in the left chest, some of which may reside in the pleural space or Fisher's. These could represent loculated collections,  small abscesses, or pulmonary nodules. Follow-up after resolution of the acute process is recommended to exclude neoplastic pulmonary nodule. Musculoskeletal: Displaced left rib fractures are again demonstrated. Review of the MIP images confirms the above findings. CT ABDOMEN and PELVIS FINDINGS Hepatobiliary: No focal liver abnormality is seen. No gallstones, gallbladder wall thickening, or biliary dilatation. Pancreas: Unremarkable. No pancreatic ductal dilatation or surrounding inflammatory changes. Spleen: Normal in size without focal abnormality. Adrenals/Urinary Tract: Adrenal glands are unremarkable. Kidneys are normal, without renal calculi, focal lesion, or hydronephrosis. Bladder is unremarkable. Stomach/Bowel: Stomach, small bowel, and colon are not abnormally distended. Enteric tube tip terminates in the third portion of the duodenum. Liquid stool throughout the colon. No wall thickening or inflammatory changes. A rectal tube is in place with retention balloon inflated in the rectum. Appendix is normal. Vascular/Lymphatic: No significant vascular findings are present. No enlarged abdominal or pelvic lymph nodes. Reproductive: Prostate is unremarkable. Other: No free air or free fluid in the abdomen. Abdominal wall musculature appears intact. Small gas collections in the anterior abdominal wall consistent with injection sites. Loculated collection consistent with hematoma lateral to the right hip, measuring 5 x 10.5 cm. Musculoskeletal: Comminuted fractures of the right acetabulum with displaced posterior acetabular fragment and without dislocation of the hip. Review of the MIP images confirms the above findings. IMPRESSION: 1. No evidence of significant pulmonary embolus. 2. Diffuse bilateral pulmonary infiltrates. 3. Scattered loculated fluid collections demonstrated in the left lung and left  pleura. These may represent abscesses or loculated pleural collections. Follow-up after resolution of the acute  process is recommended to exclude underlying neoplasm. 4. No evidence of bowel obstruction.  Liquid stool in the colon. 5. Soft tissue hematoma lateral to the right hip. 6. Displaced fractures of multiple left ribs and of the right acetabulum. Electronically Signed   By: Lucienne Capers M.D.   On: 08/27/2021 18:53   CT ABDOMEN PELVIS W CONTRAST  Result Date: 08/27/2021 CLINICAL DATA:  Pulmonary embolus suspected. Unknown D-dimer. Postoperative abdominal pain. EXAM: CT ANGIOGRAPHY CHEST CT ABDOMEN AND PELVIS WITH CONTRAST TECHNIQUE: Multidetector CT imaging of the chest was performed using the standard protocol during bolus administration of intravenous contrast. Multiplanar CT image reconstructions and MIPs were obtained to evaluate the vascular anatomy. Multidetector CT imaging of the abdomen and pelvis was performed using the standard protocol during bolus administration of intravenous contrast. RADIATION DOSE REDUCTION: This exam was performed according to the departmental dose-optimization program which includes automated exposure control, adjustment of the mA and/or kV according to patient size and/or use of iterative reconstruction technique. CONTRAST:  26m OMNIPAQUE IOHEXOL 350 MG/ML SOLN COMPARISON:  CT chest abdomen and pelvis 08/10/2021. Chest radiograph 08/27/2021 FINDINGS: CTA CHEST FINDINGS Cardiovascular: Moderately good opacification of the central and proximal segmental pulmonary arteries. No focal filling defects are identified. No evidence of significant pulmonary embolus. Cardiac enlargement. No pericardial effusions. Normal caliber thoracic aorta. No aortic dissection. Great vessel origins are patent. Right central venous catheter with tip in the low SVC. Mediastinum/Nodes: Enteric tube is in place. Tracheostomy tube with tip above the carina. Esophagus is decompressed. No significant lymphadenopathy. Thyroid gland is unremarkable. Lungs/Pleura: Diffuse bilateral airspace and interstitial  infiltrates, likely edema although multifocal pneumonia or ARDS could also have this appearance. A left chest tube is in place. No significant pneumothorax. Mild pleural thickening. Multiple fluid attenuation nodules or collections demonstrated in the left chest, some of which may reside in the pleural space or Fisher's. These could represent loculated collections, small abscesses, or pulmonary nodules. Follow-up after resolution of the acute process is recommended to exclude neoplastic pulmonary nodule. Musculoskeletal: Displaced left rib fractures are again demonstrated. Review of the MIP images confirms the above findings. CT ABDOMEN and PELVIS FINDINGS Hepatobiliary: No focal liver abnormality is seen. No gallstones, gallbladder wall thickening, or biliary dilatation. Pancreas: Unremarkable. No pancreatic ductal dilatation or surrounding inflammatory changes. Spleen: Normal in size without focal abnormality. Adrenals/Urinary Tract: Adrenal glands are unremarkable. Kidneys are normal, without renal calculi, focal lesion, or hydronephrosis. Bladder is unremarkable. Stomach/Bowel: Stomach, small bowel, and colon are not abnormally distended. Enteric tube tip terminates in the third portion of the duodenum. Liquid stool throughout the colon. No wall thickening or inflammatory changes. A rectal tube is in place with retention balloon inflated in the rectum. Appendix is normal. Vascular/Lymphatic: No significant vascular findings are present. No enlarged abdominal or pelvic lymph nodes. Reproductive: Prostate is unremarkable. Other: No free air or free fluid in the abdomen. Abdominal wall musculature appears intact. Small gas collections in the anterior abdominal wall consistent with injection sites. Loculated collection consistent with hematoma lateral to the right hip, measuring 5 x 10.5 cm. Musculoskeletal: Comminuted fractures of the right acetabulum with displaced posterior acetabular fragment and without  dislocation of the hip. Review of the MIP images confirms the above findings. IMPRESSION: 1. No evidence of significant pulmonary embolus. 2. Diffuse bilateral pulmonary infiltrates. 3. Scattered loculated fluid collections demonstrated in the left lung and left pleura. These  may represent abscesses or loculated pleural collections. Follow-up after resolution of the acute process is recommended to exclude underlying neoplasm. 4. No evidence of bowel obstruction.  Liquid stool in the colon. 5. Soft tissue hematoma lateral to the right hip. 6. Displaced fractures of multiple left ribs and of the right acetabulum. Electronically Signed   By: Lucienne Capers M.D.   On: 08/27/2021 18:53   DG Chest Port 1 View  Result Date: 08/27/2021 CLINICAL DATA:  Chest trauma. Abnormal respiration. EXAM: PORTABLE CHEST 1 VIEW COMPARISON:  August 25, 2021 FINDINGS: Tracheostomy tube and enteric catheter are stable. Left chest tube is also stable in position. Normal cardiac silhouette. Worsening bilateral diffuse ground-glass and nodular opacities. No evidence of pneumothorax. Left-sided rib fractures. IMPRESSION: 1. Worsening bilateral diffuse ground-glass and nodular pulmonary opacities. 2. No evidence of pneumothorax radiographically. 3. Stable support apparatus. Electronically Signed   By: Fidela Salisbury M.D.   On: 08/27/2021 10:51   [place] EKG SITE RITE  Result Date: 08/25/2021 If Site Rite image not attached, placement could not be confirmed due to current cardiac rhythm.  DG CHEST PORT 1 VIEW  Result Date: 08/25/2021 CLINICAL DATA:  Left chest tube EXAM: PORTABLE CHEST 1 VIEW COMPARISON:  Chest radiograph from one day prior. FINDINGS: Tracheostomy tube tip overlies the tracheal air column at the thoracic inlet. Enteric tube enters stomach with the tip not seen on this image. Stable left chest tube position. Stable cardiomediastinal silhouette with normal heart size. No pneumothorax. No pleural effusion. Widespread  patchy opacities in both lungs, most prominent in the upper right and basilar left lung, not appreciably changed. IMPRESSION: 1. Stable support structures. No residual left pneumothorax. 2. Stable widespread patchy opacities in both lungs. Electronically Signed   By: Ilona Sorrel M.D.   On: 08/25/2021 08:01   DG Chest Port 1 View  Result Date: 08/24/2021 CLINICAL DATA:  Respiratory failure. EXAM: PORTABLE CHEST 1 VIEW COMPARISON:  08/22/2021 FINDINGS: Tracheostomy tube, small bore feeding tube with tip off the field of view and LEFT thoracostomy tube again noted. A small LEFT apicolateral pneumothorax is visualized (less than 10%). Diffuse bilateral airspace opacities and multiple LEFT rib fractures are again noted. IMPRESSION: Small LEFT apicolateral pneumothorax (less than 10%). LEFT thoracostomy tube remains in place. Unchanged diffuse bilateral airspace opacities. Electronically Signed   By: Margarette Canada M.D.   On: 08/24/2021 08:50   [place] EKG SITE RITE  Result Date: 08/23/2021 If Site Rite image not attached, placement could not be confirmed due to current cardiac rhythm.  DG Chest Port 1 View  Result Date: 08/22/2021 CLINICAL DATA:  Respiratory failure EXAM: PORTABLE CHEST 1 VIEW COMPARISON:  08/20/2021 FINDINGS: Tracheostomy tube is positioned over the tracheal air shadow. Feeding tube enters the stomach. Left chest tube remains in place. No pneumothorax observed. Displaced left rib fractures are again noted. Low lung volumes. Hazy opacities are present throughout both lungs with slightly more confluent airspace opacity at the left lung base and right upper lobe which could be from atelectasis or pneumonia. Probably chronic left scapular deformity. IMPRESSION: 1. Mild worsening of airspace opacities at the left lung base and in the right upper lobe. Background hazy opacities in both lungs, cannot exclude underlying edema. 2. Left chest tube remains in place. No pneumothorax. Multiple displaced left  rib fractures. 3. Chronic deformity left scapula. Electronically Signed   By: Van Clines M.D.   On: 08/22/2021 16:55   DG Pelvis Comp Min 3V  Result Date: 08/21/2021 CLINICAL  DATA:  Acetabular fracture EXAM: JUDET PELVIS - 3+ VIEW COMPARISON:  08/19/2021 FINDINGS: Left femoral approach venous and arterial catheters appear appropriately positioned. No appreciable change in appearance of right acetabular fracture. Hip joint alignment is maintained without dislocation. No new fracture. No pelvic diastasis. IMPRESSION: Unchanged appearance of right acetabular fracture. Electronically Signed   By: Davina Poke D.O.   On: 08/21/2021 12:30    Microbiology: Results for orders placed or performed during the hospital encounter of 08/05/21  MRSA Next Gen by PCR, Nasal     Status: None   Collection Time: 08/06/21  2:58 AM   Specimen: Nasal Mucosa; Nasal Swab  Result Value Ref Range Status   MRSA by PCR Next Gen NOT DETECTED NOT DETECTED Final    Comment: (NOTE) The GeneXpert MRSA Assay (FDA approved for NASAL specimens only), is one component of a comprehensive MRSA colonization surveillance program. It is not intended to diagnose MRSA infection nor to guide or monitor treatment for MRSA infections. Test performance is not FDA approved in patients less than 63 years old. Performed at [facility], [place] [address] [place], [place] [address]   Resp Panel by RT-PCR (Flu A&B, Covid) Anterior Nasal Swab     Status: None   Collection Time: 08/06/21  3:45 AM   Specimen: Anterior Nasal Swab  Result Value Ref Range Status   SARS Coronavirus 2 by RT PCR NEGATIVE NEGATIVE Final    Comment: (NOTE) SARS-CoV-2 target nucleic acids are NOT DETECTED.  The SARS-CoV-2 RNA is generally detectable in upper respiratory specimens during the acute phase of infection. The lowest concentration of SARS-CoV-2 viral copies this assay can detect is 138 copies/mL. A negative result does not preclude  SARS-Cov-2 infection and should not be used as the sole basis for treatment or other patient management decisions. A negative result may occur with  improper specimen collection/handling, submission of specimen other than nasopharyngeal swab, presence of viral mutation(s) within the areas targeted by this assay, and inadequate number of viral copies(<138 copies/mL). A negative result must be combined with clinical observations, patient history, and epidemiological information. The expected result is Negative.  Fact Sheet for Patients:  EntrepreneurPulse.com.au  Fact Sheet for Healthcare Providers:  IncredibleEmployment.be  This test is no t yet approved or cleared by the [place] FDA and  has been authorized for detection and/or diagnosis of SARS-CoV-2 by FDA under an Emergency Use Authorization (EUA). This EUA will remain  in effect (meaning this test can be used) for the duration of the COVID-19 declaration under Section 564(b)(1) of the Act, 21 U.S.C.section 360bbb-3(b)(1), unless the authorization is terminated  or revoked sooner.       Influenza A by PCR NEGATIVE NEGATIVE Final   Influenza B by PCR NEGATIVE NEGATIVE Final    Comment: (NOTE) The Xpert Xpress SARS-CoV-2/FLU/RSV plus assay is intended as an aid in the diagnosis of influenza from Nasopharyngeal swab specimens and should not be used as a sole basis for treatment. Nasal washings and aspirates are unacceptable for Xpert Xpress SARS-CoV-2/FLU/RSV testing.  Fact Sheet for Patients: EntrepreneurPulse.com.au  Fact Sheet for Healthcare Providers: IncredibleEmployment.be  This test is not yet approved or cleared by the [place] FDA and has been authorized for detection and/or diagnosis of SARS-CoV-2 by FDA under an Emergency Use Authorization (EUA). This EUA will remain in effect (meaning this test can be used) for the duration of  the COVID-19 declaration under Section 564(b)(1) of the Act, 21 U.S.C. section 360bbb-3(b)(1), unless  the authorization is terminated or revoked.  Performed at [facility], [place] [address] [place], [place] [address]   Culture, Respiratory w Gram Stain     Status: None   Collection Time: 08/07/21  8:01 PM   Specimen: Bronchial Brushing, Right; Respiratory  Result Value Ref Range Status   Specimen Description TRACHEAL ASPIRATE  Final   Special Requests NONE  Final   Gram Stain   Final    RARE WBC PRESENT,BOTH PMN AND MONONUCLEAR NO ORGANISMS SEEN    Culture   Final    NO GROWTH 2 DAYS Performed at [facility], [place] [address] [place], [place] [address]    Report Status 08/10/2021 FINAL  Final  Culture, blood (Routine X 2) w Reflex to ID Panel     Status: None   Collection Time: 08/18/21  6:35 AM   Specimen: BLOOD  Result Value Ref Range Status   Specimen Description BLOOD FOOT  Final   Special Requests   Final    BOTTLES DRAWN AEROBIC AND ANAEROBIC Blood Culture adequate volume   Culture   Final    NO GROWTH 5 DAYS Performed at [facility], [place] [address], [place], [place] [address]    Report Status 08/23/2021 FINAL  Final  Culture, blood (Routine X 2) w Reflex to ID Panel     Status: None   Collection Time: 08/18/21  6:41 AM   Specimen: BLOOD  Result Value Ref Range Status   Specimen Description BLOOD FOOT  Final   Special Requests   Final    BOTTLES DRAWN AEROBIC AND ANAEROBIC Blood Culture adequate volume   Culture   Final    NO GROWTH 5 DAYS Performed at [facility], [place] [address], [place], [place] [address]    Report Status 08/23/2021 FINAL  Final  Culture, Respiratory w Gram Stain     Status: None   Collection Time: 08/18/21  7:46 AM   Specimen: Tracheal Aspirate; Respiratory  Result Value Ref Range Status   Specimen Description EXPECTORATED SPUTUM  Final   Special Requests NONE  Final   Gram Stain   Final    FEW WBC PRESENT,  PREDOMINANTLY PMN RARE GRAM NEGATIVE RODS RARE YEAST WITH PSEUDOHYPHAE Performed at [facility], [address] [place], [place] [address]    Culture   Final    FEW STENOTROPHOMONAS MALTOPHILIA FEW CANDIDA PARAPSILOSIS    Report Status 08/22/2021 FINAL  Final   Organism ID, Bacteria STENOTROPHOMONAS MALTOPHILIA  Final      Susceptibility   Stenotrophomonas maltophilia - MIC*    LEVOFLOXACIN 0.5 SENSITIVE Sensitive     TRIMETH/SULFA <=20 SENSITIVE Sensitive     * FEW STENOTROPHOMONAS MALTOPHILIA  Culture, Respiratory w Gram Stain     Status: None   Collection Time: 08/27/21  4:51 PM   Specimen: Tracheal Aspirate; Respiratory  Result Value Ref Range Status   Specimen Description TRACHEAL ASPIRATE  Final   Special Requests NONE  Final   Gram Stain   Final    ABUNDANT SQUAMOUS EPITHELIAL CELLS PRESENT ABUNDANT WBC PRESENT,BOTH PMN AND MONONUCLEAR RARE BUDDING YEAST SEEN Performed at [facility], [place] [address], [place], [place] [address]    Culture RARE CANDIDA PARAPSILOSIS FEW CANDIDA KRUSEI   Final   Report Status 08/31/2021 FINAL  Final  Culture, Respiratory w Gram Stain     Status: None   Collection Time: 08/28/21 12:33 PM   Specimen: Bronchoalveolar  Lavage; Respiratory  Result Value Ref Range Status   Specimen Description BRONCHIAL ALVEOLAR LAVAGE  Final   Special Requests RML  Final   Gram Stain NO WBC SEEN NO ORGANISMS SEEN   Final   Culture   Final    NO GROWTH 2 DAYS Performed at [facility], [address], [place], [place] [address]    Report Status 08/30/2021 FINAL  Final  Culture, Respiratory w Gram Stain     Status: None   Collection Time: 08/28/21 12:33 PM   Specimen: Bronchoalveolar Lavage; Respiratory  Result Value Ref Range Status   Specimen Description BRONCHIAL ALVEOLAR LAVAGE  Final   Special Requests RUL  Final   Gram Stain   Final    RARE SQUAMOUS EPITHELIAL CELLS PRESENT RARE WBC PRESENT, PREDOMINANTLY MONONUCLEAR NO  ORGANISMS SEEN    Culture   Final    NO GROWTH 2 DAYS Performed at [facility], [place] [address] [place], [place] [address]    Report Status 08/30/2021 FINAL  Final  Fungus Culture With Stain     Status: None (Preliminary result)   Collection Time: 08/28/21 12:33 PM   Specimen: Bronchial Alveolar Lavage  Result Value Ref Range Status   Fungus Stain Final report  Final    Comment: (NOTE) Performed At: [facility] [place], [place] [id number] Rush Farmer MD PY:[id number]    Fungus (Mycology) Culture PENDING  Incomplete   Fungal Source BAL RIGHT UPPER LOBE  Final    Comment: Performed at [facility], [place] [address] [place], [place] [address]  Fungus Culture Result     Status: None   Collection Time: 08/28/21 12:33 PM  Result Value Ref Range Status   Result 1 Comment  Final    Comment: (NOTE) KOH/Calcofluor preparation:  no fungus observed. Performed At: [facility] [place], [place] [id number] Rush Farmer MD WI:[id number]   Culture, Respiratory w Gram Stain     Status: None   Collection Time: 08/28/21 12:34 PM   Specimen: Bronchoalveolar Lavage; Respiratory  Result Value Ref Range Status   Specimen Description BRONCHIAL ALVEOLAR LAVAGE  Final   Special Requests RUL  Final   Gram Stain NO WBC SEEN NO ORGANISMS SEEN   Final   Culture   Final    NO GROWTH 2 DAYS Performed at [facility], [address], [place], [place] [address]    Report Status 08/30/2021 FINAL  Final  Culture, blood (Routine X 2) w Reflex to ID Panel     Status: None   Collection Time: 08/28/21  2:49 PM   Specimen: BLOOD LEFT FOREARM  Result Value Ref Range Status   Specimen Description BLOOD LEFT FOREARM  Final   Special Requests IN PEDIATRIC BOTTLE Blood Culture adequate volume  Final   Culture   Final    NO GROWTH 5 DAYS Performed at [facility], [place] [address] [place], [place] [address]    Report Status 09/02/2021  FINAL  Final  Culture, blood (Routine X 2) w Reflex to ID Panel     Status: None   Collection Time: 08/28/21  2:59 PM   Specimen: BLOOD LEFT FOREARM  Result Value Ref Range Status   Specimen Description BLOOD LEFT FOREARM  Final   Special Requests   Final    BOTTLES DRAWN AEROBIC AND ANAEROBIC Blood Culture adequate volume   Culture   Final    NO GROWTH 5 DAYS  Performed at [facility], [place] [address], [place], [place] [address]    Report Status 09/02/2021 FINAL  Final  Culture, Respiratory w Gram Stain     Status: None   Collection Time: 08/28/21  4:24 PM   Specimen: Bronchoalveolar Lavage; Respiratory  Result Value Ref Range Status   Specimen Description BRONCHIAL ALVEOLAR LAVAGE  Final   Special Requests LEFT LOWER LOBE  Final   Gram Stain NO WBC SEEN NO ORGANISMS SEEN   Final   Culture   Final    NO GROWTH 2 DAYS Performed at [facility], [address] [place], [place] [address]    Report Status 08/30/2021 FINAL  Final    Labs: CBC: Recent Labs  Lab 09/13/21 0438 09/15/21 1346 09/16/21 0421 09/19/21 0700  WBC 16.1* 12.0* 10.1 6.6  HGB 10.9* 11.7* 12.0* 11.3*  HCT 33.3* 36.5* 37.0* 34.4*  MCV 92.2 92.9 93.2 92.7  PLT 326 364 352 122   Basic Metabolic Panel: Recent Labs  Lab 09/13/21 0438 09/16/21 0421 09/19/21 0700  NA 135 138 137  K 4.2 4.0 3.5  CL 100 99 107  CO2 _0 GLUCOSE 101* 114* 104*  BUN _1 CREATININE 0.51* 0.69 0.58*  CALCIUM 8.8* 9.3 8.5*  MG  --   --  2.3   Liver Function Tests: Recent Labs  Lab 09/13/21 0438  AST 40  ALT 108*  ALKPHOS 173*  BILITOT 0.5  PROT 6.3*  ALBUMIN 2.8*   CBG: Recent Labs  Lab 09/15/21 2324 09/16/21 0439 09/16/21 0805 09/16/21 1231 09/16/21 1734  GLUCAP 103* 111* 116* 154* 129*    Discharge time spent: greater than 30 minutes.  Signed: Flora Lipps, MD [facility] 09/19/2021

## 2021-09-19 NOTE — Progress Notes (Signed)
Inpatient Rehabilitation Admissions Coordinator   I have insurance approval and CIR bed to admit him to today. Discussed with Dr Louanne Belton and [name], PA. I met with patient and his Dad at bedside and they are in agreement. I will make the arrangements. Acute team and TOC.  Danne Baxter, RN, MSN Rehab Admissions Coordinator [phone] 09/19/2021 11:01 AM

## 2021-09-19 NOTE — TOC Transition Note (Signed)
Transition of Care [facility]) - CM/SW Discharge Note   Patient Details  Name: Jeff [name] MRN: [id number] Date of Birth: [birth year]  Transition of Care [facility]) CM/SW Contact:  Kermit Balo, RN Phone Number: 09/19/2021, 10:18 AM   Clinical Narrative:    Pt is discharging to CIR today. CM signing off.    Final next level of care: IP Rehab Facility Barriers to Discharge: No Barriers Identified   Patient Goals and CMS Choice     Choice offered to / list presented to : Patient  Discharge Placement                       Discharge Plan and Services   Discharge Planning Services: CM Consult                                 Social Determinants of Health (SDOH) Interventions     Readmission Risk Interventions     No data to display
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
## 2021-09-19 NOTE — Plan of Care (Addendum)
Patient to admit to CIR, provided both [place] and [place] brian injury book.  Also provided posterior hip precautions.

## 2021-09-19 NOTE — Progress Notes (Signed)
Inpatient Rehabilitation Admission Medication Review by a Pharmacist  A complete drug regimen review was completed for this patient to identify any potential clinically significant medication issues.  High Risk Drug Classes Is patient taking? Indication by Medication  Antipsychotic Yes   Anticoagulant Yes Lovenox- RLE DVT / RUE vein thrombosis- age indeterminant  Antibiotic No   Opioid Yes Tramdol/oxycodone- acute pain  Antiplatelet No   Hypoglycemics/insulin No   Vasoactive Medication Yes Clonidine, propranolol- HTN  Chemotherapy No   Other Yes Gabapentin- neuropathic pain Melatonin- sleep Zoloft- MDD Amantadine - encephalopathy Protonix - GERD     Type of Medication Issue Identified Description of Issue Recommendation(s)  Drug Interaction(s) (clinically significant)     Duplicate Therapy     Allergy     No Medication Administration End Date     Incorrect Dose     Additional Drug Therapy Needed     Significant med changes from prior encounter (inform family/care partners about these prior to discharge).    Other  No PTA meds     Clinically significant medication issues were identified that warrant physician communication and completion of prescribed/recommended actions by midnight of the next day:  No  Name of provider notified for urgent issues identified:   Provider Method of Notification:     Pharmacist comments:   Time spent performing this drug regimen review (minutes):  30  Ulyses Southward, PharmD, [place], AAHIVP, CPP Infectious Disease Pharmacist 09/19/2021 8:05 PM

## 2021-09-20 ENCOUNTER — Other Ambulatory Visit: Payer: Self-pay

## 2021-09-20 DIAGNOSIS — S069XAA Unspecified intracranial injury with loss of consciousness status unknown, initial encounter: Secondary | ICD-10-CM | POA: Diagnosis not present

## 2021-09-20 DIAGNOSIS — D72829 Elevated white blood cell count, unspecified: Secondary | ICD-10-CM | POA: Diagnosis not present

## 2021-09-20 DIAGNOSIS — K5903 Drug induced constipation: Secondary | ICD-10-CM

## 2021-09-20 DIAGNOSIS — G4701 Insomnia due to medical condition: Secondary | ICD-10-CM

## 2021-09-20 MED ORDER — TRAZODONE HCL 50 MG PO TABS
25.0000 mg | ORAL_TABLET | Freq: Every evening | ORAL | Status: DC | PRN
  Administered 2021-09-26: 25 mg via ORAL
  Filled 2021-09-20 (×3): qty 1
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
## 2021-09-20 NOTE — Plan of Care (Signed)
  Problem: RH Swallowing Goal: LTG Patient will consume least restrictive diet using compensatory strategies with assistance (SLP) Description: LTG:  Patient will consume least restrictive diet using compensatory strategies with assistance (SLP) Flowsheets (Taken 09/20/2021 1701) LTG: Pt Patient will consume least restrictive diet using compensatory strategies with assistance of (SLP): Supervision   Problem: RH Cognition - SLP Goal: RH LTG Patient will demonstrate orientation with cues Description:  LTG:  Patient will demonstrate orientation to person/place/time/situation with cues (SLP)   Flowsheets (Taken 09/20/2021 1701) LTG Patient will demonstrate orientation to:  Person  Place  Time  Situation LTG: Patient will demonstrate orientation using cueing (SLP): Minimal Assistance - Patient > 75%   Problem: RH Comprehension Communication Goal: LTG Patient will comprehend basic/complex auditory (SLP) Description: LTG: Patient will comprehend basic/complex auditory information with cues (SLP). Flowsheets (Taken 09/20/2021 1701) LTG: Patient will comprehend: Basic auditory information LTG: Patient will comprehend auditory information with cueing (SLP): Supervision   Problem: RH Expression Communication Goal: LTG Patient will verbally express basic/complex needs(SLP) Description: LTG:  Patient will verbally express basic/complex needs, wants or ideas with cues  (SLP) Flowsheets (Taken 09/20/2021 1701) LTG: Patient will verbally express basic/complex needs, wants or ideas (SLP): Minimal Assistance - Patient > 75%   Problem: RH Problem Solving Goal: LTG Patient will demonstrate problem solving for (SLP) Description: LTG:  Patient will demonstrate problem solving for basic/complex daily situations with cues  (SLP) Flowsheets (Taken 09/20/2021 1701) LTG: Patient will demonstrate problem solving for (SLP): Basic daily situations LTG Patient will demonstrate problem solving for: Minimal Assistance  - Patient > 75%   Problem: RH Memory Goal: LTG Patient will use memory compensatory aids to (SLP) Description: LTG:  Patient will use memory compensatory aids to recall biographical/new, daily complex information with cues (SLP) Flowsheets (Taken 09/20/2021 1701) LTG: Patient will use memory compensatory aids to (SLP): Minimal Assistance - Patient > 75%   Problem: RH Attention Goal: LTG Patient will demonstrate this level of attention during functional activites (SLP) Description: LTG:  Patient will will demonstrate this level of attention during functional activites (SLP) Flowsheets (Taken 09/20/2021 1701) Patient will demonstrate during cognitive/linguistic activities the attention type of: Sustained LTG: Patient will demonstrate this level of attention during cognitive/linguistic activities with assistance of (SLP): Minimal Assistance - Patient > 75%
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
## 2021-09-20 NOTE — Plan of Care (Signed)
  Problem: RH Balance Goal: LTG Patient will maintain dynamic sitting balance (PT) Description: LTG:  Patient will maintain dynamic sitting balance with assistance during mobility activities (PT) Flowsheets (Taken 09/20/2021 1502) LTG: Pt will maintain dynamic sitting balance during mobility activities with:: Supervision/Verbal cueing Goal: LTG Patient will maintain dynamic standing balance (PT) Description: LTG:  Patient will maintain dynamic standing balance with assistance during mobility activities (PT) Flowsheets (Taken 09/20/2021 1502) LTG: Pt will maintain dynamic standing balance during mobility activities with:: Minimal Assistance - Patient > 75%   Problem: Sit to Stand Goal: LTG:  Patient will perform sit to stand with assistance level (PT) Description: LTG:  Patient will perform sit to stand with assistance level (PT) Flowsheets (Taken 09/20/2021 1502) LTG: PT will perform sit to stand in preparation for functional mobility with assistance level: Minimal Assistance - Patient > 75%   Problem: RH Bed Mobility Goal: LTG Patient will perform bed mobility with assist (PT) Description: LTG: Patient will perform bed mobility with assistance, with/without cues (PT). Flowsheets (Taken 09/20/2021 1502) LTG: Pt will perform bed mobility with assistance level of: Supervision/Verbal cueing   Problem: RH Bed to Chair Transfers Goal: LTG Patient will perform bed/chair transfers w/assist (PT) Description: LTG: Patient will perform bed to chair transfers with assistance (PT). Flowsheets (Taken 09/20/2021 1502) LTG: Pt will perform Bed to Chair Transfers with assistance level: Minimal Assistance - Patient > 75%   Problem: RH Car Transfers Goal: LTG Patient will perform car transfers with assist (PT) Description: LTG: Patient will perform car transfers with assistance (PT). Flowsheets (Taken 09/20/2021 1502) LTG: Pt will perform car transfers with assist:: Minimal Assistance - Patient > 75%    Problem: RH Furniture Transfers Goal: LTG Patient will perform furniture transfers w/assist (OT/PT) Description: LTG: Patient will perform furniture transfers  with assistance (OT/PT). Flowsheets (Taken 09/20/2021 1502) LTG: Pt will perform furniture transfers with assist:: Minimal Assistance - Patient > 75%   Problem: RH Ambulation Goal: LTG Patient will ambulate in controlled environment (PT) Description: LTG: Patient will ambulate in a controlled environment, # of feet with assistance (PT). Flowsheets (Taken 09/20/2021 1502) LTG: Pt will ambulate in controlled environ  assist needed:: Minimal Assistance - Patient > 75% LTG: Ambulation distance in controlled environment: 75ft with LRAD   Problem: RH Wheelchair Mobility Goal: LTG Patient will propel w/c in controlled environment (PT) Description: LTG: Patient will propel wheelchair in controlled environment, # of feet with assist (PT) Flowsheets (Taken 09/20/2021 1508) LTG: Pt will propel w/c in controlled environ  assist needed:: Supervision/Verbal cueing LTG: Propel w/c distance in controlled environment: 189ft Goal: LTG Patient will propel w/c in home environment (PT) Description: LTG: Patient will propel wheelchair in home environment, # of feet with assistance (PT). Flowsheets (Taken 09/20/2021 1508) LTG: Pt will propel w/c in home environ  assist needed:: Supervision/Verbal cueing Distance: wheelchair distance in controlled environment: 50

## 2021-09-21 DIAGNOSIS — G4701 Insomnia due to medical condition: Secondary | ICD-10-CM | POA: Diagnosis not present

## 2021-09-21 DIAGNOSIS — S069XAA Unspecified intracranial injury with loss of consciousness status unknown, initial encounter: Secondary | ICD-10-CM | POA: Diagnosis not present

## 2021-09-21 DIAGNOSIS — D62 Acute posthemorrhagic anemia: Secondary | ICD-10-CM

## 2021-09-21 DIAGNOSIS — D72829 Elevated white blood cell count, unspecified: Secondary | ICD-10-CM | POA: Diagnosis not present

## 2021-09-21 DIAGNOSIS — K5903 Drug induced constipation: Secondary | ICD-10-CM | POA: Diagnosis not present

## 2021-09-21 DIAGNOSIS — R41840 Attention and concentration deficit: Secondary | ICD-10-CM

## 2021-09-21 LAB — CBC WITH DIFFERENTIAL/PLATELET
Abs Immature Granulocytes: 0.07 10*3/uL (ref 0.00–0.07)
Basophils Absolute: 0.1 10*3/uL (ref 0.0–0.1)
Basophils Relative: 1 %
Eosinophils Absolute: 0.1 10*3/uL (ref 0.0–0.5)
Eosinophils Relative: 2 %
HCT: 37.9 % — ABNORMAL LOW (ref 39.0–52.0)
Hemoglobin: 12.1 g/dL — ABNORMAL LOW (ref 13.0–17.0)
Immature Granulocytes: 1 %
Lymphocytes Relative: 27 %
Lymphs Abs: 2.1 10*3/uL (ref 0.7–4.0)
MCH: 29.4 pg (ref 26.0–34.0)
MCHC: 31.9 g/dL (ref 30.0–36.0)
MCV: 92.2 fL (ref 80.0–100.0)
Monocytes Absolute: 0.7 10*3/uL (ref 0.1–1.0)
Monocytes Relative: 9 %
Neutro Abs: 4.6 10*3/uL (ref 1.7–7.7)
Neutrophils Relative %: 60 %
Platelets: 402 10*3/uL — ABNORMAL HIGH (ref 150–400)
RBC: 4.11 MIL/uL — ABNORMAL LOW (ref 4.22–5.81)
RDW: 14.3 % (ref 11.5–15.5)
WBC: 7.7 10*3/uL (ref 4.0–10.5)
nRBC: 0 % (ref 0.0–0.2)

## 2021-09-21 MED ORDER — METHYLPHENIDATE HCL 5 MG PO TABS
5.0000 mg | ORAL_TABLET | Freq: Two times a day (BID) | ORAL | Status: DC
  Administered 2021-09-21 – 2021-10-11 (×39): 5 mg via ORAL
  Filled 2021-09-21 (×39): qty 1

## 2021-09-21 NOTE — Progress Notes (Signed)
Occupational Therapy Session Note  Patient Details  Name: Jeff [name] MRN: [id number] Date of Birth: 2000/03/29  Today's Date: 09/21/2021 OT Individual Time: 0100-0200, [id number] OT Individual Time Calculation (min): 60 min , 60 min   Short Term Goals: Week 1:  OT Short Term Goal 1 (Week 1): Pt will compelte bed<>chair transfer wiht MOD A of 1 in prep for toielting OT Short Term Goal 2 (Week 1): Pt will don shirt wiht MOD A OT Short Term Goal 3 (Week 1): Pt will groom with supervision OT Short Term Goal 4 (Week 1): Pt will sit EOM with MIN A for 5 min during functional task Week 2:     Skilled Therapeutic Interventions/Progress Updates:    OT treatment session (1):  The patient was seated at w/c LOF upon arrival .  The pt was able to complete hand over hand for bathing, he was able to wash his face and chest region, but presented with decrease attention for task midway  through completion.  The pt was able to comb his hair requiring hand over hand assist for task initiation and completion.  The pt was instructed in AROM of BUE, he was able to range the RUE , but was instructed to incorporate the LUE by  threading his hands for  going in to shld flexion.  The pt was asked to visualize his goal  and then incorporate his eyes by looking at the extremity and then generating the movement.The pt was provided low resistive foam block to use with the LUE to improve his coordination and hand strength.  The pt remained at w/c LOF with his familty present at the end of the session.   OT session (2):  The patient was up in his w/c upon arrival in a tilted position to reduce pressure .  The pt complete UB exercises using the sponge with bilateral hands,  squeezing and releasing the sponge 10 x, the went on to complete guided towel exercises for shld flexion, horizontal abduction and shld rotation with rest breaks between sets, the pt required both physical and vc's during task performance.   The pt was also instructed in the importance of pressure relief and repositioning to remain in compliance concerning his R hip precautions, his family was present to reinforce the instruction for effective carryover.The pt remained at w/c LOF with his family preset at the time of treatment.   Therapy Documentation Precautions:  Precautions Precautions: Posterior Hip, Fall Precaution Booklet Issued: Yes (comment) Precaution Comments: trach, TBI, strict R posterior hip precautions no adduction Required Braces or Orthoses: Knee Immobilizer - Right Knee Immobilizer - Right: Other (comment) Splint/Cast: R thumb spica, LUE has two splint options- Radial Nerve palsy with max wear time 2 hours and a wrist cock up splint. must sleep in the wrist cock up at night Restrictions Weight Bearing Restrictions: Yes RUE Weight Bearing: Weight bearing as tolerated LUE Weight Bearing: Non weight bearing RLE Weight Bearing: Non weight bearing LLE Weight Bearing: Weight bearing as tolerated Other Position/Activity Restrictions: Per [name] on 09/12/21 okay for weight L UE with transfers and on R UE with splint on L LE WBAT, NWB and hip precautions R LE - need to clarify.  Therapy/Group: Individual Therapy  Lavona Mound 09/21/2021, 4:30 PM

## 2021-09-21 NOTE — Progress Notes (Signed)
Physical Therapy Session Note  Patient Details  Name: Jeff [name] MRN: [id number] Date of Birth: [birth year]  Today's Date: 09/21/2021 PT Individual Time: 0810-0920 PT Individual Time Calculation (min): 70 min   Short Term Goals: Week 1:  PT Short Term Goal 1 (Week 1): Pt will perform bed mobilty with mod assist PT Short Term Goal 2 (Week 1): Pt will remain out of bed >2 hours between therapies PT Short Term Goal 3 (Week 1): Pt will remain awake for entire PT treatment PT Short Term Goal 4 (Week 1): Pt will transfer to [facility] with mod assist  Skilled Therapeutic Interventions/Progress Updates:     Patient in bed asleep with his sister at bedside upon PT arrival. Patient slow to arouse and participated in PT session throughout. Patient reported un-rated pain at end of session, RN made aware. PT provided repositioning, rest breaks, and distraction as pain interventions throughout session.   PMSV donned at beginning of session once patient was awake. Vitals WFL throughout. Coughing x2 follow transfer and drinking water with a straw x1. Cued patient to take small sips. Patient non verbal, using head shaking yes/no throughout session. Did verbalize x2 near end of session with prompting from him sister. Patient verbalized in [place], sister reports he mostly speaks [place], only spanish with family at baseline. Patient followed simple cues intermittently, often requiring 2-3 prompts before initiating movement. Limited use of upper extremities without hand over hand assist with combing hair at the sink in the w/c. Frequently repositioning his lower extremities in the w/c. Readjusted TIS w/c during session to address head support, R lower extremity support, and deflate Roho cushion to patient's comfort due to over inflation.   Patient's sister reports she will be staying with the patient until Wednesday, then their parents will be taking turns with the patient. Educated on importance of  respite care and policies to prevent falls and manage patient's safety in the absence of family in the room.   Therapeutic Activity: Bed Mobility: Patient performed rolling R/L with min A-CGA to the R and mod-max A to the L for total A peri-care and changing incontinence brief, and donning pants due to bowel incontinence at beginning of session. He performed supine to sit with mod-max A with cues for initiation, sequencing, and trunk control.  Transfers: Patient performed squat pivot bed>TIS w/c with mod A, followed weight bearing precautions with min facilitation for R lower extremity. Provided verbal cues for hand placement, head-hips relationship, initiation, and forward weight shift.  Patient in TIS w/c with his sister in the room at end of session with breaks locked, seat belt alarm retrieved, but not set, as family is in the room and seat belt secured, and all needs within reach.   Therapy Documentation Precautions:  Precautions Precautions: Posterior Hip, Fall Precaution Booklet Issued: Yes (comment) Precaution Comments: trach, TBI, strict R posterior hip precautions no adduction Required Braces or Orthoses: Knee Immobilizer - Right Knee Immobilizer - Right: Other (comment) Splint/Cast: R thumb spica, LUE has two splint options- Radial Nerve palsy with max wear time 2 hours and a wrist cock up splint. must sleep in the wrist cock up at night Restrictions Weight Bearing Restrictions: Yes RUE Weight Bearing: Weight bearing as tolerated LUE Weight Bearing: Non weight bearing RLE Weight Bearing: Non weight bearing LLE Weight Bearing: Weight bearing as tolerated Other Position/Activity Restrictions: Per [name] on 09/12/21 okay for weight L UE with transfers and on R UE with splint on L LE WBAT, NWB  and hip precautions R LE - need to clarify.    Therapy/Group: Individual Therapy  Krzysztof Reichelt L Satoru Milich PT, DPT, NCS, CBIS  09/21/2021, 11:47 AM

## 2021-09-21 NOTE — Discharge Instructions (Addendum)
Inpatient Rehab Discharge Instructions  [name] Discharge date and time: No discharge date for patient encounter.   Activities/Precautions/ Functional Status: Activity: Nonweightbearing right lower extremity with posterior hip precautions Diet: Regular Wound Care: Routine skin checks Functional status:  ___ No restrictions     ___ Walk up steps independently ___ 24/7 supervision/assistance   ___ Walk up steps with assistance ___ Intermittent supervision/assistance  ___ Bathe/dress independently ___ Walk with walker     _x__ Bathe/dress with assistance ___ Walk Independently    ___ Shower independently ___ Walk with assistance    ___ Shower with assistance ___ No alcohol     ___ Return to work/school ________  COMMUNITY REFERRALS UPON DISCHARGE:    Outpatient: PT     OT    ST                 Agency: [facility]    Phone:  [phone]             Appointment Date/Time: *Please expect follow-up within 7-10 business days to schedule. If you have not received follow-up, be sure to contact the branch directly.*  Medical Equipment/Items Ordered: wheelchair, rolling walker, 3in1 bedside commode and shower chair with back                                                 Agency/Supplier: [employer] [phone]  Medical Equipment/Items Ordered: wound vac                                                 Agency/Supplier: [employer] [phone]; if any concerns about wound vac, be sure to contact orthopedic office first!  GENERAL COMMUNITY RESOURCES FOR PATIENT/FAMILY: If transportation services are needed, please utilize current insurance plan by contacting: [employer] ([employer]) [phone]. Be sure to contact two business days in advance o schedule appointments. One passenger may accompany and must be an adult.     Special Instructions: No driving smoking or alcohol  Wound VAC to right side change every 2 weeks as per Dr. Carola Frost  [phone]   My questions have been answered and I understand these instructions. I will adhere to these goals and the provided educational materials after my discharge from the hospital.  Patient/Caregiver Signature _______________________________ Date __________  Clinician Signature _______________________________________ Date __________  Please bring this form and your medication list with you to all your follow-up doctor's appointments.   Information on my medicine - ELIQUIS (apixaban)  This medication education was reviewed with me or my healthcare representative as part of my discharge preparation.   Why was Eliquis prescribed for you? Eliquis was prescribed to treat blood clots that may have been found in the veins of your legs (deep vein thrombosis) or in your lungs (pulmonary embolism) and to reduce the risk of them occurring again.  What do You need to know about Eliquis ? The dose is ONE 5 mg tablet taken TWICE daily.  Eliquis may be taken with or without food.   Try to take the dose about the same time in the morning and in the evening. If you have difficulty swallowing the tablet whole please discuss with your pharmacist how to take the medication safely.  Take Eliquis exactly as prescribed and DO NOT stop taking Eliquis without talking to the doctor who prescribed the medication.  Stopping may increase your risk of developing a new blood clot.  Refill your prescription before you run out.  After discharge, you should have regular check-up appointments with your healthcare provider that is prescribing your Eliquis.    What do you do if you miss a dose? If a dose of ELIQUIS is not taken at the scheduled time, take it as soon as possible on the same day and twice-daily administration should be resumed. The dose should not be doubled to make up for a missed dose.  Important Safety Information A possible side effect of Eliquis is bleeding. You should call your  healthcare provider right away if you experience any of the following: Bleeding from an injury or your nose that does not stop. Unusual colored urine (red or dark brown) or unusual colored stools (red or black). Unusual bruising for unknown reasons. A serious fall or if you hit your head (even if there is no bleeding).  Some medicines may interact with Eliquis and might increase your risk of bleeding or clotting while on Eliquis. To help avoid this, consult your healthcare provider or pharmacist prior to using any new prescription or non-prescription medications, including herbals, vitamins, non-steroidal anti-inflammatory drugs (NSAIDs) and supplements.  This website has more information on Eliquis (apixaban): http://www.eliquis.com/eliquis/home
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
## 2021-09-22 ENCOUNTER — Encounter (HOSPITAL_COMMUNITY): Payer: Self-pay | Admitting: Physical Medicine & Rehabilitation

## 2021-09-22 DIAGNOSIS — Z930 Tracheostomy status: Secondary | ICD-10-CM | POA: Diagnosis not present

## 2021-09-22 DIAGNOSIS — S81809A Unspecified open wound, unspecified lower leg, initial encounter: Secondary | ICD-10-CM | POA: Diagnosis not present

## 2021-09-22 DIAGNOSIS — S069X3S Unspecified intracranial injury with loss of consciousness of 1 hour to 5 hours 59 minutes, sequela: Secondary | ICD-10-CM | POA: Diagnosis not present

## 2021-09-22 DIAGNOSIS — G479 Sleep disorder, unspecified: Secondary | ICD-10-CM

## 2021-09-22 LAB — CBC WITH DIFFERENTIAL/PLATELET
Abs Immature Granulocytes: 0.07 10*3/uL (ref 0.00–0.07)
Basophils Absolute: 0 10*3/uL (ref 0.0–0.1)
Basophils Relative: 0 %
Eosinophils Absolute: 0.2 10*3/uL (ref 0.0–0.5)
Eosinophils Relative: 2 %
HCT: 39.2 % (ref 39.0–52.0)
Hemoglobin: 12.5 g/dL — ABNORMAL LOW (ref 13.0–17.0)
Immature Granulocytes: 1 %
Lymphocytes Relative: 25 %
Lymphs Abs: 2.2 10*3/uL (ref 0.7–4.0)
MCH: 29.6 pg (ref 26.0–34.0)
MCHC: 31.9 g/dL (ref 30.0–36.0)
MCV: 92.9 fL (ref 80.0–100.0)
Monocytes Absolute: 0.7 10*3/uL (ref 0.1–1.0)
Monocytes Relative: 8 %
Neutro Abs: 5.8 10*3/uL (ref 1.7–7.7)
Neutrophils Relative %: 64 %
Platelets: 406 10*3/uL — ABNORMAL HIGH (ref 150–400)
RBC: 4.22 MIL/uL (ref 4.22–5.81)
RDW: 14.2 % (ref 11.5–15.5)
WBC: 9 10*3/uL (ref 4.0–10.5)
nRBC: 0 % (ref 0.0–0.2)

## 2021-09-22 LAB — COMPREHENSIVE METABOLIC PANEL
ALT: 33 U/L (ref 0–44)
AST: 15 U/L (ref 15–41)
Albumin: 3.1 g/dL — ABNORMAL LOW (ref 3.5–5.0)
Alkaline Phosphatase: 197 U/L — ABNORMAL HIGH (ref 38–126)
Anion gap: 8 (ref 5–15)
BUN: 8 mg/dL (ref 6–20)
CO2: 27 mmol/L (ref 22–32)
Calcium: 9.3 mg/dL (ref 8.9–10.3)
Chloride: 104 mmol/L (ref 98–111)
Creatinine, Ser: 0.66 mg/dL (ref 0.61–1.24)
GFR, Estimated: >60 mL/min (ref >60)
Glucose, Bld: 107 mg/dL — ABNORMAL HIGH (ref 70–99)
Potassium: 3.8 mmol/L (ref 3.5–5.1)
Sodium: 139 mmol/L (ref 135–145)
Total Bilirubin: 0.3 mg/dL (ref 0.3–1.2)
Total Protein: 7.1 g/dL (ref 6.5–8.1)

## 2021-09-22 MED ORDER — CEFAZOLIN SODIUM-DEXTROSE 2-4 GM/100ML-% IV SOLN
2.0000 g | INTRAVENOUS | Status: AC
  Filled 2021-09-22: qty 100

## 2021-09-22 MED ORDER — TRAMADOL HCL 50 MG PO TABS
50.0000 mg | ORAL_TABLET | Freq: Four times a day (QID) | ORAL | Status: DC | PRN
  Administered 2021-09-22 – 2021-10-05 (×9): 50 mg via ORAL
  Filled 2021-09-22 (×18): qty 1

## 2021-09-22 NOTE — Progress Notes (Signed)
   09/22/21 1325  Clinical Encounter Type  Visited With Patient and family together (Patient's Sister)  Visit Type Initial  Referral From Physician;Nurse Ranelle Oyster, MD; Hessie Dibble. Cindee Lame, RN)  Consult/Referral To Chaplain Benetta Spar)  Recommendations Advance Directive Education   I had the pleasure to meet with Mr. Jeff [name] and his sister at patient's bedside. As requested, I provided Advance Directive Education this afternoon. Due to the extent of patient's brain injury, he is currently not competent to sign Health Care Power of Attorney and Living Will.  Patient's sister confirms that at the present time Jeff [name] is not competent to sign the document. As his condition progresses we will go ahead and get the documents prepared for him.  [address] [place], Aletha Halim., [phone]

## 2021-09-22 NOTE — Progress Notes (Signed)
Inpatient Rehabilitation  Patient information reviewed and entered into eRehab system by July Nickson M. Cosby Proby, M.A., CCC/SLP, PPS Coordinator.  Information including medical coding, functional ability and quality indicators will be reviewed and updated through discharge.    

## 2021-09-22 NOTE — IPOC Note (Signed)
Overall Plan of Care [facility]) Patient Details Name: Jeff [name] MRN: [id number] DOB: [birth year]  Admitting Diagnosis: TBI (traumatic brain injury) [facility])  Hospital Problems: Principal Problem:   TBI (traumatic brain injury) [facility])     Functional Problem List: Nursing Behavior, Bladder, Bowel, Pain, Perception, Edema, Safety, Endurance, Sensory, Medication Management, Skin Integrity, Motor  PT Balance, Behavior, Edema, Endurance, Motor, Nutrition, Pain, Perception, Safety, Sensory, Skin Integrity  OT Balance, Behavior, Cognition, Motor, Endurance, Nutrition, Pain, Perception, Safety, Sensory, Skin Integrity, Vision  SLP Behavior, Cognition, Linguistic, Safety  TR         Basic ADL's: OT Grooming, Bathing, Dressing, Toileting, Eating     Advanced  ADL's: OT       Transfers: PT Bed Mobility, Bed to Chair, Car, Occupational psychologist, Research scientist (life sciences): PT Ambulation, Psychologist, prison and probation services, Stairs     Additional Impairments: OT Fuctional Use of Upper Extremity  SLP Swallowing, Communication, Social Cognition expression, comprehension Problem Solving, Memory, Awareness, Attention  TR      Anticipated Outcomes Item Anticipated Outcome  Self Feeding S  Swallowing  sup A   Basic self-care  MIN  Toileting  MIN   Bathroom Transfers CGA  Bowel/Bladder  to return to being continent x 2  Transfers  min assist transfers  Locomotion  supervsion assist WC mobility. min assist gait for short distances only  Communication  sup A  Cognition  min A  Pain  less than 3  Safety/Judgment  to remain fall free while in rehab   Therapy Plan: PT Intensity: Minimum of 1-2 x/day ,45 to 90 minutes PT Frequency: 5 out of 7 days PT Duration Estimated Length of Stay: 3.4-4 weeks OT Intensity: Minimum of 1-2 x/day, 45 to 90 minutes OT Frequency: 5 out of 7 days OT Duration/Estimated Length of Stay: 3.5-4 weeks SLP Intensity: Minumum of 1-2 x/day, 30 to 90  minutes SLP Frequency: 3 to 5 out of 7 days SLP Duration/Estimated Length of Stay: 3-4 weeks   Team Interventions: Nursing Interventions Patient/Family Education, Disease Management/Prevention, Skin Care/Wound Management, Discharge Planning, Bladder Management, Pain Management, Cognitive Remediation/Compensation, Psychosocial Support, Bowel Management, Medication Management  PT interventions Ambulation/gait training, Discharge planning, Functional mobility training, Therapeutic Activities, Psychosocial support, Visual/perceptual remediation/compensation, Balance/vestibular training, Disease management/prevention, Neuromuscular re-education, Therapeutic Exercise, Skin care/wound management, Wheelchair propulsion/positioning, Cognitive remediation/compensation, DME/adaptive equipment instruction, Pain management, Splinting/orthotics, UE/LE Strength taining/ROM, Community reintegration, Development worker, international aid stimulation, Equities trader education, Museum/gallery curator, UE/LE Coordination activities  OT Interventions Warden/ranger, Discharge planning, Functional electrical stimulation, Pain management, Therapeutic Activities, Self Care/advanced ADL retraining, UE/LE Coordination activities, Visual/perceptual remediation/compensation, Therapeutic Exercise, Patient/family education, Skin care/wound managment, Functional mobility training, Disease mangement/prevention, Cognitive remediation/compensation, Firefighter, Fish farm manager, Neuromuscular re-education, Psychosocial support, Splinting/orthotics, UE/LE Strength taining/ROM, Wheelchair propulsion/positioning  SLP Interventions Cognitive remediation/compensation, Environmental controls, Internal/external aids, Speech/Language facilitation, Financial trader, Dysphagia/aspiration precaution training, Functional tasks, Patient/family education, Therapeutic Activities  TR Interventions    SW/CM Interventions Discharge  Planning, Psychosocial Support, Patient/Family Education   Barriers to Discharge MD  Medical stability  Nursing Decreased caregiver support, Trach, Incontinence, Wound Care, Weight bearing restrictions, Medication compliance, Pending surgery, Behavior    PT Home environment access/layout, Incontinence, Wound Care, Insurance for SNF coverage, Weight bearing restrictions, Medication compliance, Behavior    OT Incontinence, Wound Care, Weight bearing restrictions, Pending surgery    SLP      SW       Team Discharge Planning: Destination: PT-Home ,OT- Home , SLP-Home Projected Follow-up: PT-Home health PT,  OT-  Home health OT, Outpatient OT, SLP-24 hour supervision/assistance, Outpatient SLP, Home Health SLP Projected Equipment Needs: PT-Wheelchair (measurements), Wheelchair cushion (measurements), Rolling walker with 5" wheels, To be determined, OT- 3 in 1 bedside comode, Tub/shower bench, To be determined, SLP-None recommended by SLP Equipment Details: PT- , OT-  Patient/family involved in discharge planning: PT- Patient, Family member/caregiver,  OT-Family member/caregiver, SLP-Family member/caregiver  MD ELOS: 3-4 weeks Medical Rehab Prognosis:  Excellent Assessment: The patient has been admitted for CIR therapies with the diagnosis of TBI, complex RLE wound/hip injury. The team will be addressing functional mobility, strength, stamina, balance, safety, adaptive techniques and equipment, self-care, bowel and bladder mgt, patient and caregiver education, NMR, cognition, communication, trach mgt, wound care, pain mgt. Goals have been set at min assist for self-care and mobility and sup/min assist with cognition and communication/swallowing. Anticipated discharge destination is home with family.        See Team Conference Notes for weekly updates to the plan of care
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
## 2021-09-22 NOTE — Progress Notes (Signed)
Inpatient Rehabilitation Center Individual Statement of Services  Patient Name:  Jeff [name]  Date:  09/22/2021  Welcome to the Inpatient Rehabilitation Center.  Our goal is to provide you with an individualized program based on your diagnosis and situation, designed to meet your specific needs.  With this comprehensive rehabilitation program, you will be expected to participate in at least 3 hours of rehabilitation therapies Monday-Friday, with modified therapy programming on the weekends.  Your rehabilitation program will include the following services:  Physical Therapy (PT), Occupational Therapy (OT), Speech Therapy (ST), 24 hour per day rehabilitation nursing, Therapeutic Recreaction (TR), Psychology, Neuropsychology, Care Coordinator, Rehabilitation Medicine, Nutrition Services, Pharmacy Services, and Other  Weekly team conferences will be held on Tuesdays to discuss your progress.  Your Inpatient Rehabilitation Care Coordinator will talk with you frequently to get your input and to update you on team discussions.  Team conferences with you and your family in attendance may also be held.  Expected length of stay: 3-4 weeks  Overall anticipated outcome: Minimal Assiststance  Depending on your progress and recovery, your program may change. Your Inpatient Rehabilitation Care Coordinator will coordinate services and will keep you informed of any changes. Your Inpatient Rehabilitation Care Coordinator's name and contact numbers are listed  below.  The following services may also be recommended but are not provided by the Inpatient Rehabilitation Center:  Driving Evaluations Home Health Rehabiltiation Services Outpatient Rehabilitation Services Vocational Rehabilitation   Arrangements will be made to provide these services after discharge if needed.  Arrangements include referral to agencies that provide these services.  Your insurance has been verified to be:  [employer]  [place] Medicaid/[employer] O[place]  Your primary doctor is:  No PCP  Pertinent information will be shared with your doctor and your insurance company.  Inpatient Rehabilitation Care Coordinator:  Susie Cassette [phone] or ([phone]  Information discussed with and copy given to patient by: Gretchen Short, 09/22/2021, 12:08 PM

## 2021-09-22 NOTE — Progress Notes (Signed)
Patients trach capped per MD order. Patient states he feels fine with cap on. Sats are 97%. RT will monitor.
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
## 2021-09-22 NOTE — Progress Notes (Signed)
Speech Language Pathology TBI Note  Patient Details  Name: Jeff [name] MRN: [id number] Date of Birth: 2001-01-09  Today's Date: 09/22/2021 SLP Individual Time: 9767-3419 SLP Individual Time Calculation (min): 60 min  Short Term Goals: Week 1: SLP Short Term Goal 1 (Week 1): Patient will focus attention to functional tasks for 1 minute intervals with mod A verbal/visual cues SLP Short Term Goal 2 (Week 1): Patient will utilize external aids to orient to time/place/situation with max A multimodal cues SLP Short Term Goal 3 (Week 1): Patient will complete basic sorting/organization/sequencing tasks with max A multimodal cues SLP Short Term Goal 4 (Week 1): Patient will demonstrate basic problem solving skills with max A multimodal cues SLP Short Term Goal 5 (Week 1): Pt's family/care partner will verbalize and demonstrate understanding on donning/doffing PMSV with sup A verbal cues SLP Short Term Goal 6 (Week 1): Pt will consume current diet with minimal s/sx of aspiration and with min A verbal cues to implement slow rate of consumption and small bite sizes.   Skilled Therapeutic Interventions: Skilled treatment session focused on cognitive goals. Upon arrival, patient was awake in bed. PMSV was donned with all vitals remaining WFL throughout session. Patient initially answered 3 of SLP's questions related to his name and orientation to place and time. However, patient remained nonverbal throughout the reminder of the session despite Max A multimodal cues. SLP facilitated session by providing Max A multimodal cues for sustained attention and functional problem solving while sorting coins from a field of 4. Patient also required Max A multimodal cues for initiation when following commands (hand me 2 quarters). Patient's sister present and reported patient awake all night due to pain, suspect fatigue impacting function. Patient left upright in bed with alarm on and all needs within  reach. Continue with current plan of care.      Pain No/Denies Pain   Agitated Behavior Scale: TBI Observation Details Observation Environment: CIR Start of observation period - Date: 09/22/21 Start of observation period - Time: 0915 End of observation period - Date: 09/22/21 End of observation period - Time: 1015 Agitated Behavior Scale (DO NOT LEAVE BLANKS) Short attention span, easy distractibility, inability to concentrate: Present to an extreme degree Impulsive, impatient, low tolerance for pain or frustration: Absent Uncooperative, resistant to care, demanding: Absent Violent and/or threatening violence toward people or property: Absent Explosive and/or unpredictable anger: Absent Rocking, rubbing, moaning, or other self-stimulating behavior: Absent Pulling at tubes, restraints, etc.: Absent Wandering from treatment areas: Absent Restlessness, pacing, excessive movement: Present to a moderate degree Repetitive behaviors, motor, and/or verbal: Absent Rapid, loud, or excessive talking: Absent Sudden changes of mood: Absent Easily initiated or excessive crying and/or laughter: Absent Self-abusiveness, physical and/or verbal: Absent Agitated behavior scale total score: 19  Therapy/Group: Individual Therapy  Virgin Zellers 09/22/2021, 2:57 PM

## 2021-09-22 NOTE — Progress Notes (Signed)
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   Past Surgical History:  Past Surgical History:  Procedure Laterality Date   APPLICATION OF WOUND VAC Right 09/18/2021   Procedure: APPLICATION OF WOUND VAC;  Surgeon: Jeff Marengo, MD;  Location: [place];  Service: Orthopedics;  Laterality: Right;   ARTERIAL LINE INSERTION N/A 08/05/2021   Procedure: ARTERIAL LINE INSERTION;  Surgeon: Jeff Artist, MD;  Location: [place] CV LAB;  Service: Cardiovascular;  Laterality: N/A;   ECMO CANNULATION N/A 08/05/2021   Procedure: ECMO CANNULATION;  Surgeon: Jeff Artist, MD;  Location: [place] CV LAB;  Service: Cardiovascular;  Laterality: N/A;   I & D EXTREMITY Right 08/07/2021   Procedure: WASHOUT OF RIGHT UPPER EXTREMITY AND RIGHT LOWER EXTREMITY;  Surgeon: Jeff Needles, MD;  Location: [place];  Service: Orthopedics;  Laterality: Right;   I & D EXTREMITY Right 09/04/2021   Procedure: IRRIGATION AND DEBRIDEMENT EXTREMITY;  Surgeon: Jeff Habersham, MD;  Location: [place];  Service: Orthopedics;  Laterality: Right;  I&D R thigh, possible application of myriad matrix and morcells vs STSG   I & D EXTREMITY N/A 09/11/2021   Procedure: IRRIGATION AND DEBRIDEMENT OF RIGHT THIGH AND VAC CHANGE WITH MYRIAD APPLICATION;  Surgeon: Jeff Southmayd, MD;  Location: [place];  Service: Orthopedics;  Laterality: N/A;   I & D EXTREMITY Right 09/18/2021   Procedure: IRRIGATION AND DEBRIDEMENT EXTREMITY;  Surgeon: Jeff [place], MD;  Location: [place];  Service: Orthopedics;  Laterality: Right;   INSERTION OF TRACTION PIN Right 08/07/2021   Procedure: INSERTION OF TRACTION  PIN RIGHT UPPER QUAD;  Surgeon: Jeff Needles, MD;  Location: [place];  Service: Orthopedics;  Laterality: Right;   IR FLUORO GUIDE CV LINE LEFT  08/19/2021   IR US GUIDE VASC ACCESS LEFT  08/19/2021   ORIF ACETABULAR FRACTURE Right 08/12/2021   Procedure: Debridment of Right Lateral Thigh, Biologic Graft Placement (40 x 20cm), Wound Vac Exchange, Insertion of traction;  Surgeon: Jeff [place]cher[place], MD;  Location: [place];  Service: Orthopedics;  Laterality: Right;   ORIF ELBOW FRACTURE Left 08/14/2021   Procedure: OPEN REDUCTION INTERNAL FIXATION (ORIF) ELBOW/OLECRANON FRACTURE;  Surgeon: Jeff [place], MD;  Location: [place];  Service: Orthopedics;  Laterality: Left;   ORIF HUMERUS FRACTURE Left 08/14/2021   Procedure: OPEN REDUCTION INTERNAL FIXATION (ORIF) DISTAL HUMERUS FRACTURE;  Surgeon: Jeff , MD;  Location: [place];  Service: Orthopedics;  Laterality: Left;   TIBIA IM NAIL INSERTION Left 12/30/2016   Procedure: INTRAMEDULLARY (IM) NAIL TIBIAL;  Surgeon: Jeff Pel, MD;  Location: [place];  Service: Orthopedics;  Laterality: Left;   TRACHEOSTOMY TUBE PLACEMENT N/A 08/12/2021   Procedure: TRACHEOSTOMY;  Surgeon: Jeff Skeans, MD;  Location: [place];  Service: General;  Laterality: N/A;   Social History:  reports that he has been smoking cigarettes. He has never used smokeless tobacco. He reports that he does not drink alcohol. No history on file for drug use.  Family / Support Systems Marital Status: Married How Long?: a little over a year; verbally seperated for the last 2-3 months. Patient Roles: Spouse, Parent Spouse/Significant Other: Jeff [name] (wife) Children: 19 day old dtr- Jeff [name] Other Supports: Parents Anticipated Caregiver: Parents  Ability/Limitations of Caregiver: Pt father works from home and mother able to assist. Caregiver Availability: 24/7 Family Dynamics: Pt lives with his mother and father.  Social History Preferred language: English Religion: Jehovah's  Witness Cultural Background: Pt has not been working at this time Education: high school grad Probation officer - How often do you need to have someone help you when you read instructions, pamphlets, or other written material from your doctor or pharmacy?: Patient unable to respond Writes: Yes Employment Status: Unemployed Public relations account executive Issues: Sister Chief Executive Officer denies Guardian/Conservator: N/A   Abuse/Neglect Abuse/Neglect Assessment Can Be Completed: Unable to assess, patient is non-responsive or altered mental status Physical Abuse: Denies Verbal Abuse: Denies Sexual Abuse: Denies Exploitation of patient/patient's resources: Denies Self-Neglect: Denies  Patient response to: Social Isolation - How often do you feel lonely or isolated from those around you?: Patient unable to respond  Emotional Status Pt's affect, behavior and adjustment status: Pt in good spirits and confabulating at time of visit with his sister present. Recent Psychosocial Issues: N/A Psychiatric History: Pt sister indicates that he was depressed prior to this incident occurring. Substance Abuse History: Pt sister reports no tobacco product use; rarely drinks alcohol and no rec drug use.  Patient / Family Perceptions, Expectations & Goals Pt/Family understanding of illness & functional limitations: Pt family has a general understanding of pt care needs Premorbid pt/family roles/activities: Independent Anticipated changes in roles/activities/participation: Assistance with ADLs/IADLs Pt/family expectations/goals: Pt would like to address hip and back pain; pt sister reports she would like him to recover and to be able to be do more things for himself, as much as he can on his own.  Community Resources [employer] Scripts: None Premorbid Home Care/DME Agencies: None Transportation available at discharge: parents Is the patient able to respond to transportation needs?: Yes In the past 12 months, has lack  of transportation kept you from medical appointments or from getting medications?: No In the past 12 months, has lack of transportation kept you from meetings, work, or from getting things needed for daily living?: No Resource referrals recommended: Neuropsychology  Discharge Planning Living Arrangements: Parent Support Systems: Parent, Spouse/significant other, Other relatives Type of Residence: Private residence Insurance Resources: Medicaid (specify county) ([place]) Museum/gallery curator Resources: Family Support Financial Screen Referred: No Living Expenses: Lives with family Money Management: Family Does the patient have any problems obtaining your medications?: No Home Management: Pt helps manage care needs with family members. Patient/Family Preliminary Plans: TBD Care Coordinator Barriers to Discharge: Decreased caregiver support, Lack of/limited family support, Insurance for SNF coverage Care Coordinator Anticipated Follow Up Needs: HH/OP Expected length of stay: 3-4 weeks  Clinical Impression SW met with pt and pt sister Melissa to introduce self, explain role, and discuss discharge process. [name] assisted answering questions for pt. Pt is not a English as a second language teacher. No HCPOA. No DME.   1446- SW called insurance plan to see if transportation and PCS is offered through [employer]. SW informed Non emergency medical transport (NEAT) [phone]; reference ID# [id number]. Also reports PCS is handled through case management department [phone]. SW spoke with Margreta Journey with Case Management department to submit [facility] referral. Reports she will submit and an RN CM will follow-up.   Jennefer Kopp A Lazette Estala 09/22/2021, 3:06 PM

## 2021-09-22 NOTE — Progress Notes (Signed)
Occupational Therapy TBI Note  Patient Details  Name: Jeff [name] MRN: [id number] Date of Birth: Jun 23, 2000  Today's Date: 09/22/2021 OT Individual Time: 1104-1200 OT Individual Time Calculation (min): 56 min   Short Term Goals: Week 1:  OT Short Term Goal 1 (Week 1): Pt will compelte bed<>chair transfer wiht MOD A of 1 in prep for toielting OT Short Term Goal 2 (Week 1): Pt will don shirt wiht MOD A OT Short Term Goal 3 (Week 1): Pt will groom with supervision OT Short Term Goal 4 (Week 1): Pt will sit EOM with MIN A for 5 min during functional task  Skilled Therapeutic Interventions/Progress Updates:    Pt greeted semi-reclined in bed with sister present and agreeable to OT treatment session. Bed level LB bathing/dressing completed with pt able to roll L and R with min A, but pillow placed between legs when rolling L in order to maintain posterior hip precautions.Pt able to reach back and wash L thigh when in sidelying, but unable to reach far enough to wash buttocks. Total A for threading pant legs and pull up over hips. Pt then transitioned to sitting EOB with max A of 1. Pt able to achieve sitting balance with CGA, fading to close supervision. Total A to don shoes at EOB. Pt was handed a wash cloth and he could wash under both arms with guided a from OT. Pt then needed mod A to don t-shirt, but could maintain sitting balance without UE support when threading shirt. Rest breaks within BADL tasks due to pain. Pt then agreeable to get up to the TIS wc. Pt completed stand-pivot to TIS wc with mod A of 1 and much improved power up to pivot. Pt left seated in TIS wc with alarm belt on, call bell in reach, sister present, and respiratory in to see patient.   Therapy Documentation Precautions:  Precautions Precautions: Posterior Hip, Fall Precaution Booklet Issued: Yes (comment) Precaution Comments: trach, TBI, strict R posterior hip precautions no adduction Required Braces or  Orthoses: Knee Immobilizer - Right Knee Immobilizer - Right: Other (comment) Splint/Cast: R thumb spica, LUE has two splint options- Radial Nerve palsy with max wear time 2 hours and a wrist cock up splint. must sleep in the wrist cock up at night Restrictions Weight Bearing Restrictions: Yes RUE Weight Bearing: Weight bearing as tolerated LUE Weight Bearing: Non weight bearing RLE Weight Bearing: Non weight bearing LLE Weight Bearing: Weight bearing as tolerated Other Position/Activity Restrictions: Per [name] on 09/12/21 okay for weight L UE with transfers and on R UE with splint on L LE WBAT, NWB and hip precautions R LE - need to clarify. Pain:  No number given, reports pain all over his body Agitated Behavior Scale: TBI Observation Details Observation Environment: CIR Start of observation period - Date: 09/22/21 Start of observation period - Time: 1104 End of observation period - Date: 09/22/21 End of observation period - Time: 1200 Agitated Behavior Scale (DO NOT LEAVE BLANKS) Short attention span, easy distractibility, inability to concentrate: Present to a moderate degree Impulsive, impatient, low tolerance for pain or frustration: Present to a slight degree Uncooperative, resistant to care, demanding: Absent Violent and/or threatening violence toward people or property: Absent Explosive and/or unpredictable anger: Absent Rocking, rubbing, moaning, or other self-stimulating behavior: Absent Pulling at tubes, restraints, etc.: Absent Wandering from treatment areas: Absent Restlessness, pacing, excessive movement: Present to a slight degree Repetitive behaviors, motor, and/or verbal: Absent Rapid, loud, or excessive talking: Absent Sudden  changes of mood: Absent Easily initiated or excessive crying and/or laughter: Absent Self-abusiveness, physical and/or verbal: Absent Agitated behavior scale total score: 18   Therapy/Group: Individual Therapy  Mal Amabile 09/22/2021, 12:18 PM

## 2021-09-23 ENCOUNTER — Encounter (HOSPITAL_COMMUNITY): Payer: Self-pay | Admitting: Physical Medicine & Rehabilitation

## 2021-09-23 ENCOUNTER — Other Ambulatory Visit: Payer: Self-pay

## 2021-09-23 ENCOUNTER — Inpatient Hospital Stay (HOSPITAL_COMMUNITY): Payer: Medicaid Other | Admitting: Anesthesiology

## 2021-09-23 ENCOUNTER — Encounter (HOSPITAL_COMMUNITY)
Admission: RE | Disposition: A | Discharge status: Home / Self Care | Payer: Self-pay | Source: Intra-hospital | Attending: Physical Medicine & Rehabilitation

## 2021-09-23 ENCOUNTER — Inpatient Hospital Stay (HOSPITAL_COMMUNITY): Admission: RE | Admit: 2021-09-23 | Payer: Medicaid Other | Source: Ambulatory Visit | Admitting: Orthopedic Surgery

## 2021-09-23 DIAGNOSIS — Z930 Tracheostomy status: Secondary | ICD-10-CM | POA: Diagnosis not present

## 2021-09-23 DIAGNOSIS — G479 Sleep disorder, unspecified: Secondary | ICD-10-CM | POA: Diagnosis not present

## 2021-09-23 DIAGNOSIS — S79921A Unspecified injury of right thigh, initial encounter: Secondary | ICD-10-CM

## 2021-09-23 DIAGNOSIS — S81809A Unspecified open wound, unspecified lower leg, initial encounter: Secondary | ICD-10-CM | POA: Diagnosis not present

## 2021-09-23 DIAGNOSIS — S069X3S Unspecified intracranial injury with loss of consciousness of 1 hour to 5 hours 59 minutes, sequela: Secondary | ICD-10-CM | POA: Diagnosis not present

## 2021-09-23 DIAGNOSIS — F17210 Nicotine dependence, cigarettes, uncomplicated: Secondary | ICD-10-CM

## 2021-09-23 DIAGNOSIS — D649 Anemia, unspecified: Secondary | ICD-10-CM

## 2021-09-23 HISTORY — PX: SKIN SPLIT GRAFT: SHX444

## 2021-09-23 SURGERY — APPLICATION, GRAFT, SKIN, SPLIT-THICKNESS
Anesthesia: General | Laterality: Right

## 2021-09-23 MED ORDER — CHLORHEXIDINE GLUCONATE 0.12 % MT SOLN
15.0000 mL | Freq: Once | OROMUCOSAL | Status: AC
Start: 2021-09-23 — End: 2021-09-23

## 2021-09-23 MED ORDER — ACETAMINOPHEN 10 MG/ML IV SOLN
INTRAVENOUS | Status: DC | PRN
  Administered 2021-09-23: 1000 mg via INTRAVENOUS

## 2021-09-23 MED ORDER — ONDANSETRON HCL 4 MG/2ML IJ SOLN
INTRAMUSCULAR | Status: DC | PRN
  Administered 2021-09-23: 4 mg via INTRAVENOUS

## 2021-09-23 MED ORDER — MINERAL OIL LIGHT 100 % EX OIL
TOPICAL_OIL | CUTANEOUS | Status: DC | PRN
  Administered 2021-09-23: 1 via TOPICAL

## 2021-09-23 MED ORDER — LACTATED RINGERS IV SOLN: INTRAVENOUS | Status: DC

## 2021-09-23 MED ORDER — ROCURONIUM BROMIDE 10 MG/ML (PF) SYRINGE
PREFILLED_SYRINGE | INTRAVENOUS | Status: DC | PRN
  Administered 2021-09-23: 20 mg via INTRAVENOUS
  Administered 2021-09-23: 50 mg via INTRAVENOUS
  Administered 2021-09-23: 30 mg via INTRAVENOUS

## 2021-09-23 MED ORDER — ORAL CARE MOUTH RINSE: 15.0000 mL | Freq: Once | OROMUCOSAL | Status: AC

## 2021-09-23 MED ORDER — FENTANYL CITRATE (PF) 250 MCG/5ML IJ SOLN
INTRAMUSCULAR | Status: DC | PRN
Start: 2021-09-23 — End: 2021-09-23
  Administered 2021-09-23: 100 ug via INTRAVENOUS
  Administered 2021-09-23 (×2): 50 ug via INTRAVENOUS

## 2021-09-23 MED ORDER — LACTATED RINGERS IV SOLN: INTRAVENOUS | Status: DC | PRN

## 2021-09-23 MED ORDER — ACETAMINOPHEN 10 MG/ML IV SOLN
INTRAVENOUS | Status: AC
Start: 2021-09-23 — End: ?
  Filled 2021-09-23: qty 100

## 2021-09-23 MED ORDER — MIDAZOLAM HCL 2 MG/2ML IJ SOLN
INTRAMUSCULAR | Status: DC | PRN
  Administered 2021-09-23: 2 mg via INTRAVENOUS

## 2021-09-23 MED ORDER — 0.9 % SODIUM CHLORIDE (POUR BTL) OPTIME
TOPICAL | Status: DC | PRN
  Administered 2021-09-23: 1000 mL

## 2021-09-23 MED ORDER — BUPIVACAINE-EPINEPHRINE (PF) 0.5% -1:200000 IJ SOLN
INTRAMUSCULAR | Status: AC
  Filled 2021-09-23: qty 30

## 2021-09-23 MED ORDER — CHLORHEXIDINE GLUCONATE 0.12 % MT SOLN
OROMUCOSAL | Status: AC
  Administered 2021-09-23: 15 mL via OROMUCOSAL
  Filled 2021-09-23: qty 15

## 2021-09-23 MED ORDER — BUPIVACAINE-EPINEPHRINE (PF) 0.5% -1:200000 IJ SOLN
INTRAMUSCULAR | Status: DC | PRN
  Administered 2021-09-23: 60 mL

## 2021-09-23 MED ORDER — CEFAZOLIN SODIUM-DEXTROSE 2-3 GM-%(50ML) IV SOLR
INTRAVENOUS | Status: DC | PRN
  Administered 2021-09-23: 2 g via INTRAVENOUS

## 2021-09-23 MED ORDER — LACTATED RINGERS IV SOLN
INTRAVENOUS | Status: DC
Start: 2021-09-23 — End: 2021-09-23

## 2021-09-23 SURGICAL SUPPLY — 54 items
BLADE CLIPPER SURG (BLADE) IMPLANT
BLADE DERMATOME SS (BLADE) IMPLANT
BNDG COHESIVE 6X5 TAN ST LF (GAUZE/BANDAGES/DRESSINGS) IMPLANT
BNDG ELASTIC 6X5.8 VLCR STR LF (GAUZE/BANDAGES/DRESSINGS) ×2 IMPLANT
BNDG GAUZE DERMACEA FLUFF 4 (GAUZE/BANDAGES/DRESSINGS) IMPLANT
BRUSH SCRUB EZ PLAIN DRY (MISCELLANEOUS) ×2 IMPLANT
CANISTER SUCT 3000ML PPV (MISCELLANEOUS) IMPLANT
CANISTER WOUNDNEG PRESSURE 500 (CANNISTER) IMPLANT
COVER SURGICAL LIGHT HANDLE (MISCELLANEOUS) ×1 IMPLANT
DERMACARRIERS GRAFT 1 TO 1.5 (DISPOSABLE) ×2
DRAPE HALF SHEET 40X57 (DRAPES) ×2 IMPLANT
DRAPE ORTHO SPLIT 77X108 STRL (DRAPES) ×1
DRAPE SURG ORHT 6 SPLT 77X108 (DRAPES) ×1 IMPLANT
DRESSING VERAFLO CLEANS CC MED (GAUZE/BANDAGES/DRESSINGS) IMPLANT
DRSG MEPITEL 8X12 (GAUZE/BANDAGES/DRESSINGS) IMPLANT
DRSG PAD ABDOMINAL 8X10 ST (GAUZE/BANDAGES/DRESSINGS) IMPLANT
DRSG VAC ATS LRG SENSATRAC (GAUZE/BANDAGES/DRESSINGS) IMPLANT
DRSG VERAFLO CLEANSE CC MED (GAUZE/BANDAGES/DRESSINGS)
ELECT REM PT RETURN 9FT ADLT (ELECTROSURGICAL)
ELECTRODE REM PT RTRN 9FT ADLT (ELECTROSURGICAL) IMPLANT
GAUZE PAD ABD 8X10 STRL (GAUZE/BANDAGES/DRESSINGS) IMPLANT
GAUZE SPONGE 4X4 12PLY STRL (GAUZE/BANDAGES/DRESSINGS) ×2 IMPLANT
GLOVE BIO SURGEON STRL SZ7.5 (GLOVE) ×1 IMPLANT
GLOVE BIO SURGEON STRL SZ8 (GLOVE) ×1 IMPLANT
GLOVE BIOGEL PI IND STRL 7.5 (GLOVE) ×1 IMPLANT
GLOVE BIOGEL PI IND STRL 8 (GLOVE) ×1 IMPLANT
GLOVE BIOGEL PI INDICATOR 7.5 (GLOVE) ×1
GLOVE BIOGEL PI INDICATOR 8 (GLOVE) ×1
GLOVE SURG ORTHO LTX SZ7.5 (GLOVE) ×2 IMPLANT
GOWN STRL REUS W/ TWL LRG LVL3 (GOWN DISPOSABLE) ×2 IMPLANT
GOWN STRL REUS W/ TWL XL LVL3 (GOWN DISPOSABLE) ×1 IMPLANT
GOWN STRL REUS W/TWL LRG LVL3 (GOWN DISPOSABLE) ×2
GOWN STRL REUS W/TWL XL LVL3 (GOWN DISPOSABLE) ×1
GRAFT DERMACARRIERS 1 TO 1.5 (DISPOSABLE) ×1 IMPLANT
GRAFT MYRIAD 20X20 (Graft) IMPLANT
HANDPIECE INTERPULSE COAX TIP (DISPOSABLE)
KIT BASIN OR (CUSTOM PROCEDURE TRAY) ×1 IMPLANT
KIT TURNOVER KIT B (KITS) ×1 IMPLANT
NS IRRIG 1000ML POUR BTL (IV SOLUTION) ×1 IMPLANT
PACK GENERAL/GYN (CUSTOM PROCEDURE TRAY) ×1 IMPLANT
PACK ORTHO EXTREMITY (CUSTOM PROCEDURE TRAY) ×1 IMPLANT
PAD ARMBOARD 7.5X6 YLW CONV (MISCELLANEOUS) ×2 IMPLANT
PAD NEG PRESSURE SENSATRAC (MISCELLANEOUS) IMPLANT
SET HNDPC FAN SPRY TIP SCT (DISPOSABLE) IMPLANT
SPONGE T-LAP 18X18 ~~LOC~~+RFID (SPONGE) IMPLANT
STAPLER VISISTAT (STAPLE) ×1 IMPLANT
STAPLER VISISTAT 35W (STAPLE) ×1 IMPLANT
STOCKINETTE 4X48 STRL (DRAPES) ×1 IMPLANT
STOCKINETTE IMPERVIOUS LG (DRAPES) IMPLANT
SUT MNCRL AB 3-0 PS2 18 (SUTURE) IMPLANT
SUT MNCRL AB 3-0 PS2 27 (SUTURE) IMPLANT
SUT MNCRL AB 4-0 PS2 18 (SUTURE) IMPLANT
TOWEL GREEN STERILE (TOWEL DISPOSABLE) ×2 IMPLANT
UNDERPAD 30X36 HEAVY ABSORB (UNDERPADS AND DIAPERS) ×1 IMPLANT

## 2021-09-23 NOTE — Brief Op Note (Signed)
09/23/2021  5:35 PM  PATIENT:  Jeff [name]  21 y.o. male  PRE-OPERATIVE DIAGNOSIS:  degloving right thigh  POST-OPERATIVE DIAGNOSIS:  degloving right thigh  PROCEDURE:  Procedure(s): SKIN GRAFT SPLIT THICKNESS (Right) THIGH 171 cm2 DRESSING CHANGE UNDER ANESTHESIA APPLICATION OF BIOLOGIC GRAFT TO LEFT THIGH 351 cm2  SURGEON:  Surgeon(s) and Role:    Myrene Galas, MD - Primary TBA

## 2021-09-23 NOTE — Progress Notes (Signed)
Physical Therapy TBI Note  Patient Details  Name: Jeff [name] MRN: [id number] Date of Birth: 04-Oct-2000  Today's Date: 09/23/2021 PT Individual Time: 1115-1200 PT Individual Time Calculation (min): 45 min   Short Term Goals: Week 1:  PT Short Term Goal 1 (Week 1): Pt will perform bed mobilty with mod assist PT Short Term Goal 2 (Week 1): Pt will remain out of bed >2 hours between therapies PT Short Term Goal 3 (Week 1): Pt will remain awake for entire PT treatment PT Short Term Goal 4 (Week 1): Pt will transfer to [facility] with mod assist  Skilled Therapeutic Interventions/Progress Updates:     Patient in bed with his sister in the room upon PT arrival. Patient's sister visibly upset and reports frustration with toileting patient. Reports patient stated he needed to have a BM while in the TIS w/c and that she called for assistance. Stated that assistance never came and patient became restless due to urgency. Reports she performed a SBT with the patient back to bed in the room and placed the bed pan under the patient. Nursing arrived and assisted with peri-care and lower body clothing management. Patient then was incontinent of bladder and his sister removed his soiled clothing just prior to PT arrival. LPN and charge nurse made aware of his sister's reports of the above events and frustrations. Charge to follow-up with patient and his sister later today.  Spent session providing patient and his sister education on safe toileting strategies without the condom cath, using urinal and bed pan. Educated on not performing transfers with the patient without staff assist due to risk of fall or injury to patient or herself. Instructed his sister to call multiple times for assist for toileting, rather than waiting without response. Reviewed use of urinal both in the bed and the chair and will discuss with team about setting goals for [facility] transfers this week.   During education performed full  upper and lower body bathing, changed sheets, donned incontinence brief and gown in preparation for surgery this afternoon with total A due to patient fatigue. Patient per rolling R/L x2 with pillow between his legs to prevent adduction of R hip with supervision and cues.   Patient in bed with his sister at bedside at end of session with breaks locked, bed alarm set, 4 rails up per patient request, and all needs within reach.   Therapy Documentation Precautions:  Precautions Precautions: Posterior Hip, Fall Precaution Booklet Issued: Yes (comment) Precaution Comments: trach, TBI, strict R posterior hip precautions no adduction Required Braces or Orthoses: Knee Immobilizer - Right Knee Immobilizer - Right: Other (comment) Splint/Cast: R thumb spica, LUE has two splint options- Radial Nerve palsy with max wear time 2 hours and a wrist cock up splint. must sleep in the wrist cock up at night Restrictions Weight Bearing Restrictions: Yes RUE Weight Bearing: Weight bearing as tolerated LUE Weight Bearing: Weight bearing as tolerated RLE Weight Bearing: Non weight bearing LLE Weight Bearing: Weight bearing as tolerated Other Position/Activity Restrictions: Per [name] on 09/12/21 okay for weight L UE with transfers and on R UE with splint on L LE WBAT, NWB and hip precautions R LE - need to clarify. Agitated Behavior Scale: TBI Observation Details Observation Environment: CIR Start of observation period - Date: 09/23/21 Start of observation period - Time: 1115 End of observation period - Date: 09/23/21 End of observation period - Time: 1200 Agitated Behavior Scale (DO NOT LEAVE BLANKS) Short attention span, easy distractibility,  inability to concentrate: Present to a slight degree Impulsive, impatient, low tolerance for pain or frustration: Absent Uncooperative, resistant to care, demanding: Absent Violent and/or threatening violence toward people or property: Absent Explosive and/or  unpredictable anger: Absent Rocking, rubbing, moaning, or other self-stimulating behavior: Absent Pulling at tubes, restraints, etc.: Absent Wandering from treatment areas: Absent Restlessness, pacing, excessive movement: Absent Repetitive behaviors, motor, and/or verbal: Absent Rapid, loud, or excessive talking: Absent Sudden changes of mood: Absent Easily initiated or excessive crying and/or laughter: Absent Self-abusiveness, physical and/or verbal: Absent Agitated behavior scale total score: 15    Therapy/Group: Individual Therapy  Jilliam Bellmore L Bellagrace Sylvan PT, DPT, NCS, CBIS  09/23/2021, 4:16 PM

## 2021-09-23 NOTE — Transfer of Care (Signed)
Immediate Anesthesia Transfer of Care Note  Patient: Jeff [name]  Procedure(s) Performed: SKIN GRAFT SPLIT THICKNESS (Right)  Patient Location: PACU  Anesthesia Type:General  Level of Consciousness: awake and alert   Airway & Oxygen Therapy: Patient Spontanous Breathing  Post-op Assessment: Report given to RN and Post -op Vital signs reviewed and stable  Post vital signs: Reviewed and stable  Last Vitals:  Vitals Value Taken Time  BP 110/94 09/23/21 1716  Temp    Pulse 112 09/23/21 1722  Resp 20 09/23/21 1722  SpO2 97 % 09/23/21 1722  Vitals shown include unvalidated device data.  Last Pain:  Vitals:   09/23/21 1716  TempSrc:   PainSc: 0-No pain         Complications: No notable events documented.
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
## 2021-09-23 NOTE — Progress Notes (Signed)
Patient ID: Jeff [name], male   DOB: 09-21-2000, 21 y.o.   MRN: [id number]  SW went by room to give updates to family from team conference, and d/c date 9/23 but no one in room; pt off floor due to procedure. SW will follow-up.   1526- SW spoke with pt sister [name] [phone]) to inform on above. Prefers to remain main contact in efforts to translate to parents. No questions/concerns reported at this time.   Cecile Sheerer, MSW, LCSWA Office: [phone] Cell: [phone] Fax: [phone]
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
## 2021-09-23 NOTE — Progress Notes (Signed)
Occupational Therapy Session Note  Patient Details  Name: Jeff [name] MRN: [id number] Date of Birth: March 27, 2000  Today's Date: 09/23/2021 OT Individual Time: 0700-0758 OT Individual Time Calculation (min): 58 min    Short Term Goals: Week 1:  OT Short Term Goal 1 (Week 1): Pt will compelte bed<>chair transfer wiht MOD A of 1 in prep for toielting OT Short Term Goal 2 (Week 1): Pt will don shirt wiht MOD A OT Short Term Goal 3 (Week 1): Pt will groom with supervision OT Short Term Goal 4 (Week 1): Pt will sit EOM with MIN A for 5 min during functional task  Skilled Therapeutic Interventions/Progress Updates:     Pt received in bed with 6 out of 10 pain in RLE. Rest, repositioning and therapy to otlerance provided for pain relief. Overall pt much more verbal this session able to answer orientation questions as well as discuss what types of movies he likes.  ADL: Pt completes ADL at overall MAX A Level. Skilled interventions include: VC for sequencing coming to EOB from supine for hip precautions, education on 2 cup method for grooming at the sink during oral care to decrease hip flexion while seated, edu on adaptive dressing strategy of threading head first then arms to trial for next session d/t shoulder ROM deficits, MOD A SitToStand/StandPivotTtransfer for transitional movements with cuing for WB precautions of RLE, and total A to don LB clothing. VSS throughout with 1 compression sock on Pt left at end of session in bed with exit alarm on, call light in reach and all needs met   Therapy Documentation Precautions:  Precautions Precautions: Posterior Hip, Fall Precaution Booklet Issued: Yes (comment) Precaution Comments: trach, TBI, strict R posterior hip precautions no adduction Required Braces or Orthoses: Knee Immobilizer - Right Knee Immobilizer - Right: Other (comment) Splint/Cast: R thumb spica, LUE has two splint options- Radial Nerve palsy with max wear  time 2 hours and a wrist cock up splint. must sleep in the wrist cock up at night Restrictions Weight Bearing Restrictions: Yes RUE Weight Bearing: Weight bearing as tolerated LUE Weight Bearing: Non weight bearing RLE Weight Bearing: Non weight bearing LLE Weight Bearing: Weight bearing as tolerated Other Position/Activity Restrictions: Per [name] on 09/12/21 okay for weight L UE with transfers and on R UE with splint on L LE WBAT, NWB and hip precautions R LE - need to clarify. General:    Therapy/Group: Individual Therapy  [name] 09/23/2021, 6:55 AM

## 2021-09-23 NOTE — Progress Notes (Signed)
Physical Therapy Note  Patient Details  Name: Jeff [name] MRN: [id number] Date of Birth: 20-Oct-2000 Today's Date: 09/23/2021    Pt unavailable for PM rx 2/2 off unit for operative procedure.  Pt missed 43' of PT services   [name] 09/23/2021, 3:02 PM

## 2021-09-23 NOTE — Anesthesia Postprocedure Evaluation (Signed)
Anesthesia Post Note  Patient: Jeff [name]  Procedure(s) Performed: SKIN GRAFT SPLIT THICKNESS (Right)     Patient location during evaluation: PACU Anesthesia Type: General Level of consciousness: awake and alert Pain management: pain level controlled Vital Signs Assessment: post-procedure vital signs reviewed and stable Respiratory status: spontaneous breathing, nonlabored ventilation, respiratory function stable and patient connected to tracheostomy mask oxygen Cardiovascular status: blood pressure returned to baseline and stable Postop Assessment: no apparent nausea or vomiting Anesthetic complications: no   No notable events documented.  Last Vitals:  Vitals:   09/23/21 1800 09/23/21 1950  BP: (!) 137/97 136/84  Pulse: (!) 103 (!) 109  Resp: 14 16  Temp: 36.7 C 36.9 C  SpO2: 99%     Last Pain:  Vitals:   09/23/21 1950  TempSrc: Oral  PainSc:                  Collene Schlichter

## 2021-09-23 NOTE — Anesthesia Procedure Notes (Signed)
Procedure Name: Intubation Date/Time: 09/23/2021 3:03 PM  Performed by: Darryl Nestle, CRNAPre-anesthesia Checklist: Patient identified, Emergency Drugs available, Suction available and Patient being monitored Patient Re-evaluated:Patient Re-evaluated prior to induction Oxygen Delivery Method: Circle system utilized Preoxygenation: Pre-oxygenation with 100% oxygen Induction Type: IV induction, Tracheostomy and Combination inhalational/ intravenous induction Tube type: Oral Tube size: 6.5 mm Number of attempts: 1 Airway Equipment and Method: Tracheostomy Placement Confirmation: positive ETCO2 and breath sounds checked- equal and bilateral Tube secured with: Tape Dental Injury: Teeth and Oropharynx as per pre-operative assessment

## 2021-09-23 NOTE — Progress Notes (Signed)
The risks and benefits of surgery for STSG of the right hip have been discussed with the patient's family, including the possibility of infection, failure of the graft, scarring, wound breakdown, loss of motion, and need for further surgery among others.  These risks were acknowledged and consent provided to proceed.  Myrene Galas, MD [facility], [facility] [phone]

## 2021-09-23 NOTE — Progress Notes (Signed)
   09/23/21 2055  Assess: MEWS Score  Temp 98.6 F (37 C)  BP (!) 144/83  Pulse Rate (!) 129  Resp 18  SpO2 98 %  Assess: MEWS Score  MEWS Temp 0  MEWS Systolic 0  MEWS Pulse 2  MEWS RR 0  MEWS LOC 0  MEWS Score 2  MEWS Score Color Yellow  Assess: if the MEWS score is Yellow or Red  Were vital signs taken at a resting state? Yes  Focused Assessment No change from prior assessment  Does the patient meet 2 or more of the SIRS criteria? Yes  Does the patient have a confirmed or suspected source of infection? No  MEWS guidelines implemented *See Row Information* Yes  Treat  MEWS Interventions Administered scheduled meds/treatments  Take Vital Signs  Increase Vital Sign Frequency  Yellow: Q 2hr X 2 then Q 4hr X 2, if remains yellow, continue Q 4hrs  Escalate  MEWS: Escalate Yellow: discuss with charge nurse/RN and consider discussing with provider and RRT  Notify: Charge Nurse/RN  Name of Charge Nurse/RN Notified Greggory Stallion, rn  Date Charge Nurse/RN Notified 09/23/21  Time Charge Nurse/RN Notified 2040  Notify: Provider  Provider Name/Title Delle Reining  Date Provider Notified 09/23/21  Time Provider Notified 2055  Method of Notification Call  Notification Reason Other (Comment) (yellow MEWS)  Provider response See new orders  Date of Provider Response 09/23/21  Time of Provider Response 2055  Assess: SIRS CRITERIA  SIRS Temperature  0  SIRS Pulse 1  SIRS Respirations  0  SIRS WBC 1  SIRS Score Sum  2

## 2021-09-23 NOTE — Progress Notes (Signed)
Speech Language Pathology TBI Note  Patient Details  Name: Jeff [name] MRN: [id number] Date of Birth: 2000/03/02  Today's Date: 09/23/2021 SLP Individual Time: 0805-0830 SLP Individual Time Calculation (min): 25 min  Short Term Goals: Week 1: SLP Short Term Goal 1 (Week 1): Patient will focus attention to functional tasks for 1 minute intervals with mod A verbal/visual cues SLP Short Term Goal 2 (Week 1): Patient will utilize external aids to orient to time/place/situation with max A multimodal cues SLP Short Term Goal 3 (Week 1): Patient will complete basic sorting/organization/sequencing tasks with max A multimodal cues SLP Short Term Goal 4 (Week 1): Patient will demonstrate basic problem solving skills with max A multimodal cues SLP Short Term Goal 5 (Week 1): Pt's family/care partner will verbalize and demonstrate understanding on donning/doffing PMSV with sup A verbal cues SLP Short Term Goal 6 (Week 1): Pt will consume current diet with minimal s/sx of aspiration and with min A verbal cues to implement slow rate of consumption and small bite sizes.  Skilled Therapeutic Interventions: Pt seen for skilled ST with focus on cognitive goals, pt is NPO for potential surgery today, sister present throughout. Pt with increased verbalizations this date compared to yesterday, answering basic yes/no questions and providing biographical information with mod A cues for accuracy. Pt remains disoriented to [facility], date, and DOW, was able to state year is 2023 which sister was excited about (has been stating 2024). SLP facilitating sustained attention and simple problem solving during card sorting task by providing min A verbal cues. Pt completed task in timely manner despite frequent auditory and visual distractions in room throughout session. Pt pleasant and cooperative throughout, left in wheelchair with sister and nursing present for needs. Cont ST POC.   Pain Pain Assessment Pain  Scale: 0-10 Pain Score: 0-No pain  Agitated Behavior Scale: TBI Observation Details Observation Environment: CIR Start of observation period - Date: 09/23/21 Start of observation period - Time: 0805 End of observation period - Date: 09/23/21 End of observation period - Time: 0830 Agitated Behavior Scale (DO NOT LEAVE BLANKS) Short attention span, easy distractibility, inability to concentrate: Present to a slight degree Impulsive, impatient, low tolerance for pain or frustration: Absent Uncooperative, resistant to care, demanding: Absent Violent and/or threatening violence toward people or property: Absent Explosive and/or unpredictable anger: Absent Rocking, rubbing, moaning, or other self-stimulating behavior: Absent Pulling at tubes, restraints, etc.: Absent Wandering from treatment areas: Absent Restlessness, pacing, excessive movement: Absent Repetitive behaviors, motor, and/or verbal: Absent Rapid, loud, or excessive talking: Absent Sudden changes of mood: Absent Easily initiated or excessive crying and/or laughter: Absent Self-abusiveness, physical and/or verbal: Absent Agitated behavior scale total score: 15  Therapy/Group: Individual Therapy  Tacey Ruiz 09/23/2021, 9:41 AM

## 2021-09-24 ENCOUNTER — Encounter (HOSPITAL_COMMUNITY): Payer: Self-pay | Admitting: Orthopedic Surgery

## 2021-09-24 ENCOUNTER — Inpatient Hospital Stay (HOSPITAL_COMMUNITY): Payer: Medicaid Other

## 2021-09-24 DIAGNOSIS — G479 Sleep disorder, unspecified: Secondary | ICD-10-CM | POA: Diagnosis not present

## 2021-09-24 DIAGNOSIS — S069X3S Unspecified intracranial injury with loss of consciousness of 1 hour to 5 hours 59 minutes, sequela: Secondary | ICD-10-CM | POA: Diagnosis not present

## 2021-09-24 DIAGNOSIS — Z930 Tracheostomy status: Secondary | ICD-10-CM | POA: Diagnosis not present

## 2021-09-24 DIAGNOSIS — S81809A Unspecified open wound, unspecified lower leg, initial encounter: Secondary | ICD-10-CM | POA: Diagnosis not present

## 2021-09-24 MED ORDER — PANTOPRAZOLE SODIUM 40 MG PO TBEC
40.0000 mg | DELAYED_RELEASE_TABLET | Freq: Every day | ORAL | Status: DC
  Administered 2021-09-24 – 2021-10-11 (×18): 40 mg via ORAL
  Filled 2021-09-24 (×18): qty 1

## 2021-09-24 NOTE — Progress Notes (Signed)
Speech Language Pathology TBI Note  Patient Details  Name: Jeff [name] MRN: [id number] Date of Birth: 10-28-2000  Today's Date: 09/24/2021 SLP Individual Time: 1235-1305 SLP Individual Time Calculation (min): 30 min and Today's Date: 09/24/2021 SLP Missed Time: 15 Minutes Missed Time Reason: Patient fatigue  Short Term Goals: Week 1: SLP Short Term Goal 1 (Week 1): Patient will focus attention to functional tasks for 1 minute intervals with mod A verbal/visual cues SLP Short Term Goal 2 (Week 1): Patient will utilize external aids to orient to time/place/situation with max A multimodal cues SLP Short Term Goal 3 (Week 1): Patient will complete basic sorting/organization/sequencing tasks with max A multimodal cues SLP Short Term Goal 4 (Week 1): Patient will demonstrate basic problem solving skills with max A multimodal cues SLP Short Term Goal 5 (Week 1): Pt's family/care partner will verbalize and demonstrate understanding on donning/doffing PMSV with sup A verbal cues SLP Short Term Goal 6 (Week 1): Pt will consume current diet with minimal s/sx of aspiration and with min A verbal cues to implement slow rate of consumption and small bite sizes.  Skilled Therapeutic Interventions: Skilled treatment session focused on cognitive goals. Patient missed initial session due to fatigue, therefore, SLP attempted again. Upon arrival, patient was awake in bed. SLP facilitated session by providing Max-Total A assist multimodal cues for focused attention for ~30-60 seconds during a basic money management task. When SLP facilitated task to maximize attention, patient required overall Min verbal cues for problem solving. Overall, patient more alert, engaged and talkative this session with intermittent language of confusion with minimal awareness. Patient's sister present and SLP provided education regarding the nature of language of confusion. Patient left upright in bed with alarm on and all  needs within reach. Continue with current plan of care.      Pain No/Denies Pain   Agitated Behavior Scale: TBI Observation Details Observation Environment: pt room Start of observation period - Date: 09/24/21 Start of observation period - Time: 0815 End of observation period - Date: 09/24/21 End of observation period - Time: 0915 Agitated Behavior Scale (DO NOT LEAVE BLANKS) Short attention span, easy distractibility, inability to concentrate: Present to a slight degree Impulsive, impatient, low tolerance for pain or frustration: Absent Uncooperative, resistant to care, demanding: Absent Violent and/or threatening violence toward people or property: Absent Explosive and/or unpredictable anger: Absent Rocking, rubbing, moaning, or other self-stimulating behavior: Absent Pulling at tubes, restraints, etc.: Absent Wandering from treatment areas: Absent Restlessness, pacing, excessive movement: Absent Repetitive behaviors, motor, and/or verbal: Absent Rapid, loud, or excessive talking: Absent Sudden changes of mood: Absent Easily initiated or excessive crying and/or laughter: Absent Self-abusiveness, physical and/or verbal: Absent Agitated behavior scale total score: 15  Therapy/Group: Individual Therapy  Tarez Bowns 09/24/2021, 2:54 PM

## 2021-09-24 NOTE — Progress Notes (Addendum)
Physical Therapy TBI Note  Patient Details  Name: Jeff [name] MRN: [id number] Date of Birth: January 04, 2001  Today's Date: 09/24/2021 PT Individual Time: 1003-1028 AND 1350 to 1515  PT Individual Time Calculation (min): 25 min AND  Short Term Goals: Week 1:  PT Short Term Goal 1 (Week 1): Pt will perform bed mobilty with mod assist PT Short Term Goal 2 (Week 1): Pt will remain out of bed >2 hours between therapies PT Short Term Goal 3 (Week 1): Pt will remain awake for entire PT treatment PT Short Term Goal 4 (Week 1): Pt will transfer to [facility] with mod assist Week 2:    Week 3:     Skilled Therapeutic Interventions/Progress Updates:    Therapy Documentation Precautions:  Precautions Precautions: Posterior Hip, Fall Precaution Booklet Issued: Yes (comment) Precaution Comments: trach, TBI, strict R posterior hip precautions no adduction Required Braces or Orthoses: Knee Immobilizer - Right Knee Immobilizer - Right: Other (comment) Splint/Cast: R thumb spica, LUE has two splint options- Radial Nerve palsy with max wear time 2 hours and a wrist cock up splint. must sleep in the wrist cock up at night Restrictions Weight Bearing Restrictions: Yes RUE Weight Bearing: Weight bearing as tolerated LUE Weight Bearing: Weight bearing as tolerated RLE Weight Bearing: Non weight bearing LLE Weight Bearing: Weight bearing as tolerated Other Position/Activity Restrictions: Per [name] on 09/12/21 okay for weight L UE with transfers and on R UE with splint on L LE WBAT, NWB and hip precautions R LE - need to clarify. Pain: Pain Assessment Pain Scale: Faces Faces Pain Scale: Hurts little more Pain Type: Acute pain Pain Location: Leg Pain Orientation: Right;LeftPt denied pain w/therex today.  Agitated Behavior Scale: TBI Observation Details Observation Environment: cir Start of observation period - Date: 09/24/21 Start of observation period - Time: 1350 End of  observation period - Date: 09/24/21 End of observation period - Time: 1515 Agitated Behavior Scale (DO NOT LEAVE BLANKS) Short attention span, easy distractibility, inability to concentrate: Present to a slight degree Impulsive, impatient, low tolerance for pain or frustration: Absent Uncooperative, resistant to care, demanding: Absent Violent and/or threatening violence toward people or property: Absent Explosive and/or unpredictable anger: Absent Rocking, rubbing, moaning, or other self-stimulating behavior: Absent Pulling at tubes, restraints, etc.: Absent Wandering from treatment areas: Absent Restlessness, pacing, excessive movement: Absent Repetitive behaviors, motor, and/or verbal: Absent Rapid, loud, or excessive talking: Absent Sudden changes of mood: Absent Easily initiated or excessive crying and/or laughter: Absent Self-abusiveness, physical and/or verbal: Absent Agitated behavior scale total score: 15  AM SESSION  Pt received supine w/nursing in room stating they just transferred him to bed for impending xray.  Agreed to bed level session Supine therex: Therapist removed R knee immobilizer  Patient performed the following exercises with verbal and tactile cues for proper technique and to maintain attention to task. -R quad sets x10 with 5 sec hold Tknee TKE limited AROM 3x10 -B hip/knee flex/extens AROM L, AAROM R approx 50% ROMmited by pain, 2x10 -B hip abductionADD AAROM R for abd/ AROM L in supine to neutral only 2x10 -B glute sets 2x10 with 5 sec hold -gentle R heel cord stretch 2x1 min,   Reviewed hip precautions, pt unable to state any of 3 primary post hip precs. Immobilizer repositioned . Pt left supine w/rails up x 3, alarm set, bed in lowest position, and needs in reach.  PM session Pt initially supine, agreeable to session, mom and sister at bedside.  Pt does not recall this therapist or am session.  Supine to sit w/min assist and cues/assist to adhere to  hip precautions.  Pt attempts to cross L under R mult times during session/repeated ed performe re hip precautions.  L wrist spint applied by therapist.  Reviewed wbing restrictions and cues provided throughout session w/mobility.  Able to maintain NWB RLE without difficulty, cues for UE w/all. Scooting in sitting w/cues.  Squat pivot to wc w/set up and mod assist.  Transported to gym Worked on Sit to standa nd standing tolerance in parallel bars. Initially mod assist w/sit to stand.  Pt able to stand up to 45 sec w/min assist, cues to limit RUE use, limited primarily by increasing R foot pain in dependent position.  During seated rest, therapist performs heelcord stretching w/gentle distraction to equalize potential wbing force, gentle mobilization of subtalar jts, soft tissue mob of PF.  Wc to mat squat pivot, mod assist, heavy curing for hip precs.  In sitting pt performed sitting balance/reaching for lego blocks, then able to count blocks, then able to instruct therapist on numbers of each color block needed for assigned puzzle, pt then ompleted 3 Lego puzzles w/min errors.   Pt c/o increased RLE pain.  Sit to supine w/wedge w/mod assist.   Worked on orientation, recall of earlier events/pt unable to recall events/communication/reviewed events.  Pt believes he is at school, unable to recall mother and sister are in his room even when reviewed.    Supine to sit w/mod assist.  Squat pivot to wc w/set up and mod assist.  Transported to room.  Pt able to correctly indicate direction of turn from main hall toward room.  Pt tasked w/recalling activities performed in session/reporting to family.  Pt unable to recall single activity from session but w/heavy cueing able to recall using Lego blocks.  Tends to confabulate answers.    Squat pivot wc to bed w/mod assist. Sit to supine w/mod assist. Pt left supine w/rails up x 3, alarm set, bed in lowest position, and needs in reach.  Handed off to  nursing in room w/meds.     Therapy/Group: Individual Therapy Rada Hay, PT   Shearon Balo 09/24/2021, 11:32 AM
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
## 2021-09-24 NOTE — Patient Care Conference (Signed)
Inpatient RehabilitationTeam Conference and Plan of Care Update Date: 09/23/2021   Time: 10:25 AM    Patient Name: Jeff [name]      Medical Record Number: [id number]  Date of Birth: 16-Oct-2000 Sex: Male         Room/Bed: 4W13C/4W13C-01 Payor Info: Payor: [place] MEDICAID PREPAID HEALTH PLAN / Plan: [place] MEDICAID [facility] / Product Type: *No Product type* /    Admit Date/Time:  09/19/2021  6:25 PM  Primary Diagnosis:  TBI (traumatic brain injury) [facility])  Hospital Problems: Principal Problem:   TBI (traumatic brain injury) [facility]    Expected Discharge Date: Expected Discharge Date: 10/18/21  Team Members Present: Physician leading conference: Dr. Faith Rogue Social Worker Present: Cecile Sheerer, LCSWA Nurse Present: Other (comment) Vedia Pereyra, RN) PT Present: Serina Cowper, PT OT Present: Blanch Media, OT SLP Present: Feliberto Gottron, SLP PPS Coordinator present : Fae Pippin, SLP     Current Status/Progress Goal Weekly Team Focus  Bowel/Bladder   incontinent bladder, continent bowel  regain total continence  toilet q 2 hr and prn   Swallow/Nutrition/ Hydration   Regular textures with thin liquids, Min A  Supervision  tolerance of current diet   ADL's   Total A LB ADLs, Mod/max A UB ADLs, Mod/max A stand-pivot transfers  Min/CGA  self-care retraining, cognitive retraining, activity tolerance, transfers, functional use of B UEs   Mobility   Mod A bed mobility and transfers, able to keep NWB on R during transfers with cues  Min A overall, gait 15 ft, w/c 100 ft supervision  activity tolerance, functional mobility, balance, adherance to precautions, initiation of gait training, strength/ROM, patient/caregiver education   Communication   Max A  Min A  initiation of verbal expression of wants/needs   Safety/Cognition/ Behavioral Observations  [place]ancho Level V-VI, Max A  Min A  attention, initiation, orientation, basic problem solving   Pain    no reports of pain  < 3  assess pain q 4hr and prn   Skin   wound vac in place, healing incisions, several pressure injuries  no new breakdown  assess skin q shift and prn     Discharge Planning:  D/c to home with his parents who will provide 24/7 care. Pt father works from home, and mother avaialble to assist with care needs.   Team Discussion: TBI. Rancho 5/6 Patient continent/incontinent of B/B. LBM 08/26. Pain managed with PRNs. OR today for skin graft. Mod A/Max A with therapies. Janina Mayo is corked. Tolerating well. Patient on target to meet rehab goals: yes, continues to improve daily.  *See Care Plan and progress notes for long and short-term goals.   Revisions to Treatment Plan:  Medication adjustments, monitor labs  Teaching Needs: Medications, safety, skin/wound care, B/B management, gait/transfer training, etc  Current Barriers to Discharge: Decreased caregiver support, Medical stability, Home enviroment access/layout, Trach, Incontinence, Wound care, Weight bearing restrictions, Medication compliance, and Pending surgery  Possible Resolutions to Barriers: Family education, wound care education, medication education, order recommended DME     Medical Summary Current Status: tbi rlas v/vi. rle dislocation fx with degloving injury---stsg today. pain controlled. multiple peripheral neuropathy  Barriers to Discharge: Medical stability;Wound care   Possible Resolutions to Barriers/Weekly Focus: pain control, maximize sleep. siurgical intervention for RLE. orthotics.   Continued Need for Acute Rehabilitation Level of Care: The patient requires daily medical management by a physician with specialized training in physical medicine and rehabilitation for the following reasons: Direction of a multidisciplinary physical rehabilitation  program to maximize functional independence : Yes Medical management of patient stability for increased activity during participation in an intensive  rehabilitation regime.: Yes Analysis of laboratory values and/or radiology reports with any subsequent need for medication adjustment and/or medical intervention. : Yes   I attest that I was present, lead the team conference, and concur with the assessment and plan of the team.   Jearld Adjutant 09/24/2021, 8:20 AM

## 2021-09-24 NOTE — Progress Notes (Signed)
Occupational Therapy Session Note  Patient Details  Name: Jeff [name] MRN: [id number] Date of Birth: [birth year]  Today's Date: 09/24/2021 OT Individual Time: 6286-3817 OT Individual Time Calculation (min): 58 min    Short Term Goals: Week 1:  OT Short Term Goal 1 (Week 1): Pt will compelte bed<>chair transfer wiht MOD A of 1 in prep for toielting OT Short Term Goal 2 (Week 1): Pt will don shirt wiht MOD A OT Short Term Goal 3 (Week 1): Pt will groom with supervision OT Short Term Goal 4 (Week 1): Pt will sit EOM with MIN A for 5 min during functional task  Skilled Therapeutic Interventions/Progress Updates:     Pt received in bed with R thigh unrated- medication provided by LPN for pain relief  ADL: Focus of session on alertness/arousal, transitional movements, functional mobility, AE trianing for threading BLE with reacher, dressing strategies and balance. Pt intitially very lethargic, but perks up minimally once in sitting. Increased time to process information this date d/t lethargy. Pt dons shirt with MAX A attempting to thread head first then arms. Pt able ot use reacher with MOD A to thread BLE and then second person used for advancing pants past hips. SPT with MOD A and NO AD to TIS. OT adjusts leg rest length as sister reporting too short and making pt fidget.   Pt left at end of session in bed with exit alarm on, call light in reach and all needs met  15 ABS  Therapy Documentation Precautions:  Precautions Precautions: Posterior Hip, Fall Precaution Booklet Issued: Yes (comment) Precaution Comments: trach, TBI, strict R posterior hip precautions no adduction Required Braces or Orthoses: Knee Immobilizer - Right Knee Immobilizer - Right: Other (comment) Splint/Cast: R thumb spica, LUE has two splint options- Radial Nerve palsy with max wear time 2 hours and a wrist cock up splint. must sleep in the wrist cock up at night Restrictions Weight Bearing  Restrictions: Yes RUE Weight Bearing: Weight bearing as tolerated LUE Weight Bearing: Weight bearing as tolerated RLE Weight Bearing: Non weight bearing LLE Weight Bearing: Weight bearing as tolerated Other Position/Activity Restrictions: Per [name] on 09/12/21 okay for weight L UE with transfers and on R UE with splint on L LE WBAT, NWB and hip precautions R LE - need to clarify. Therapy/Group: Individual Therapy  [name] 09/24/2021, 6:46 AM

## 2021-09-25 DIAGNOSIS — S81809A Unspecified open wound, unspecified lower leg, initial encounter: Secondary | ICD-10-CM | POA: Diagnosis not present

## 2021-09-25 DIAGNOSIS — S069X3S Unspecified intracranial injury with loss of consciousness of 1 hour to 5 hours 59 minutes, sequela: Secondary | ICD-10-CM | POA: Diagnosis not present

## 2021-09-25 DIAGNOSIS — Z930 Tracheostomy status: Secondary | ICD-10-CM | POA: Diagnosis not present

## 2021-09-25 DIAGNOSIS — G479 Sleep disorder, unspecified: Secondary | ICD-10-CM | POA: Diagnosis not present

## 2021-09-25 LAB — CREATININE, SERUM
Creatinine, Ser: 0.59 mg/dL — ABNORMAL LOW (ref 0.61–1.24)
GFR, Estimated: >60 mL/min (ref >60)

## 2021-09-25 LAB — HEPARIN ANTI-XA: Heparin LMW: 0.55 IU/mL
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
## 2021-09-25 NOTE — Progress Notes (Signed)
Pharmacy Consult - Lovenox  With DVT, on modified dose of Lovenox Heparin level this AM 0.55 (therapeutic) CBC stable  Plan: Continue Lovenox 100 mg sq Q 12 hours Follow CBC  Thank you. Okey Regal, PharmD

## 2021-09-25 NOTE — Procedures (Signed)
Tracheostomy Change Note  Patient Details:   Name: Jeff [name] DOB: [birth year] MRN: [id number]    Airway Documentation:     Evaluation  O2 sats: stable throughout Complications: No apparent complications Patient did tolerate procedure well. Bilateral Breath Sounds: Clear  Patient decannulated per order. Stoma covered with gauze and tape. Patient states it feels comfortable.  Family at bedside.    Rayburn Felt 09/25/2021, 10:41 AM

## 2021-09-25 NOTE — Progress Notes (Signed)
Occupational Therapy TBI Note  Patient Details  Name: Jeff [name] MRN: [id number] Date of Birth: 2000/06/06  Today's Date: 09/25/2021 OT Individual Time: 0905-1002 OT Individual Time Calculation (min): 57 min    Short Term Goals: Week 1:  OT Short Term Goal 1 (Week 1): Pt will compelte bed<>chair transfer wiht MOD A of 1 in prep for toielting OT Short Term Goal 2 (Week 1): Pt will don shirt wiht MOD A OT Short Term Goal 3 (Week 1): Pt will groom with supervision OT Short Term Goal 4 (Week 1): Pt will sit EOM with MIN A for 5 min during functional task  Skilled Therapeutic Interventions/Progress Updates:     Pt received in bed with unrated RLE pain. Pt requires frequent cuing as pt tries to improve positioning but attempting to adduct RLE and needs reminding of precautions  ADL: Pt completes ADL at overall +2 A only to pull pants past hips, otherwise pt able to use reacher to thread BLE with reacher and MIN A to elevate RLE. Pt able to use reacher more effectively this date, with supervision seated on EOB.  Neuromuscular Reeducation Functional reach with Saebo MAS with RUE on level 4 decreasing to level 3 assistance to allow pt to reach up to 110* shoulder flexion, horizontal ab/add and int/ext rotation. Pt completes functional reaching >150 repetitions with bean bags, suction cups, and cones. Pt reporting activity is challenging but do-able.   Pt left at end of session in bed with exit alarm on, call light in reach and all needs met   Therapy Documentation Precautions:  Precautions Precautions: Posterior Hip, Fall Precaution Booklet Issued: Yes (comment) Precaution Comments: trach, TBI, strict R posterior hip precautions no adduction Required Braces or Orthoses: Knee Immobilizer - Right Knee Immobilizer - Right: Other (comment) Splint/Cast: R thumb spica, LUE has two splint options- Radial Nerve palsy with max wear time 2 hours and a wrist cock up splint. must  sleep in the wrist cock up at night Restrictions Weight Bearing Restrictions: Yes RUE Weight Bearing: Weight bearing as tolerated LUE Weight Bearing: Weight bearing as tolerated RLE Weight Bearing: Non weight bearing LLE Weight Bearing: Weight bearing as tolerated Other Position/Activity Restrictions: Per [name] on 09/12/21 okay for weight L UE with transfers and on R UE with splint on L LE WBAT, NWB and hip precautions R LE - need to clarify.   Agitated Behavior Scale: TBI ABS discontinued d/t ABS score less than 20 for the last three days or no behaviors present         Therapy/Group: Individual Therapy  [name] 09/25/2021, 6:46 AM
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
## 2021-09-25 NOTE — Progress Notes (Signed)
Speech Language Pathology TBI Note  Patient Details  Name: Jeff [name] MRN: [id number] Date of Birth: 12-Apr-2000  Today's Date: 09/25/2021 SLP Individual Time: 1300-1357 SLP Individual Time Calculation (min): 57 min  Short Term Goals: Week 1: SLP Short Term Goal 1 (Week 1): Patient will focus attention to functional tasks for 1 minute intervals with mod A verbal/visual cues SLP Short Term Goal 2 (Week 1): Patient will utilize external aids to orient to time/place/situation with max A multimodal cues SLP Short Term Goal 3 (Week 1): Patient will complete basic sorting/organization/sequencing tasks with max A multimodal cues SLP Short Term Goal 4 (Week 1): Patient will demonstrate basic problem solving skills with max A multimodal cues SLP Short Term Goal 5 (Week 1): Pt's family/care partner will verbalize and demonstrate understanding on donning/doffing PMSV with sup A verbal cues SLP Short Term Goal 6 (Week 1): Pt will consume current diet with minimal s/sx of aspiration and with min A verbal cues to implement slow rate of consumption and small bite sizes.  Skilled Therapeutic Interventions:  Skilled treatment session focused on cognitive and dysphagia goals. Upon arrival, patient was awake while upright in the bed. Patient appeared mildly restless with constant repositioning with father overly attentive to patient's needs and requesting for him to be placed in supine for comfort. SLP provided education regarding importance of remaining upright to maximize arousal and attention to tasks. Patient's father verbalized understanding but then returned the patient to supine in SLP left the room to request pain medicines. SLP then had to reposition the patient in bed again to maximize attention to tasks. Patient consumed medications (1-2 pills at a time) with thin liquids without overt s/s of aspiration. Therefore, recommend patient take meds while with thin liquids. SLP administered the  Cognistat and patient scored mild impairments in naming, orientation, and calculations and moderate impairments in attention, short-term memory and judgement. Patient and father educated regarding results. Patient left upright in bed with alarm on and all needs within reach. Continue with current plan of care.      Pain Pain Assessment Pain Scale: 0-10 Pain Score: 6  Pain Type: Acute pain Pain Location: knee  Pain Orientation: Right Pain Descriptors / Indicators: Aching Pain Frequency: Intermittent Pain Onset: On-going Patients Stated Pain Goal: 4 Pain Intervention(s): Medication (See eMAR)  Agitated Behavior Scale: TBI Observation Details Observation Environment: CIR Start of observation period - Date: 09/25/21 Start of observation period - Time: 1300 End of observation period - Date: 09/25/21 End of observation period - Time: 1357 Agitated Behavior Scale (DO NOT LEAVE BLANKS) Short attention span, easy distractibility, inability to concentrate: Present to a slight degree Impulsive, impatient, low tolerance for pain or frustration: Absent Uncooperative, resistant to care, demanding: Absent Violent and/or threatening violence toward people or property: Absent Explosive and/or unpredictable anger: Absent Rocking, rubbing, moaning, or other self-stimulating behavior: Absent Pulling at tubes, restraints, etc.: Absent Wandering from treatment areas: Absent Restlessness, pacing, excessive movement: Absent Repetitive behaviors, motor, and/or verbal: Absent Rapid, loud, or excessive talking: Absent Sudden changes of mood: Absent Easily initiated or excessive crying and/or laughter: Absent Self-abusiveness, physical and/or verbal: Absent Agitated behavior scale total score: 15  Therapy/Group: Individual Therapy  Jeff [name] 09/25/2021, 3:01 PM

## 2021-09-26 DIAGNOSIS — M79605 Pain in left leg: Secondary | ICD-10-CM

## 2021-09-26 DIAGNOSIS — M79604 Pain in right leg: Secondary | ICD-10-CM | POA: Diagnosis not present

## 2021-09-26 DIAGNOSIS — S069XAA Unspecified intracranial injury with loss of consciousness status unknown, initial encounter: Secondary | ICD-10-CM | POA: Diagnosis not present

## 2021-09-26 DIAGNOSIS — R41840 Attention and concentration deficit: Secondary | ICD-10-CM | POA: Diagnosis not present

## 2021-09-26 DIAGNOSIS — G4701 Insomnia due to medical condition: Secondary | ICD-10-CM | POA: Diagnosis not present
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
## 2021-09-26 NOTE — Progress Notes (Signed)
Speech Language Pathology Weekly Progress and Session Note  Patient Details  Name: Jeff [name] MRN: [id number] Date of Birth: December 26, 2000  Beginning of progress report period: September 19, 2021 End of progress report period: September 26, 2021  Today's Date: 09/26/2021 SLP Individual Time: 1035-1130 SLP Individual Time Calculation (min): 55 min  Short Term Goals: Week 1: SLP Short Term Goal 1 (Week 1): Patient will focus attention to functional tasks for 1 minute intervals with mod A verbal/visual cues SLP Short Term Goal 1 - Progress (Week 1): Met SLP Short Term Goal 2 (Week 1): Patient will utilize external aids to orient to time/place/situation with max A multimodal cues SLP Short Term Goal 2 - Progress (Week 1): Met SLP Short Term Goal 3 (Week 1): Patient will complete basic sorting/organization/sequencing tasks with max A multimodal cues SLP Short Term Goal 3 - Progress (Week 1): Met SLP Short Term Goal 4 (Week 1): Patient will demonstrate basic problem solving skills with max A multimodal cues SLP Short Term Goal 4 - Progress (Week 1): Met SLP Short Term Goal 5 (Week 1): Pt's family/care partner will verbalize and demonstrate understanding on donning/doffing PMSV with sup A verbal cues SLP Short Term Goal 5 - Progress (Week 1): Discontinued (patient has been decannulated) SLP Short Term Goal 6 (Week 1): Pt will consume current diet with minimal s/sx of aspiration and with min A verbal cues to implement slow rate of consumption and small bite sizes. SLP Short Term Goal 6 - Progress (Week 1): Met    New Short Term Goals: Week 2: SLP Short Term Goal 1 (Week 2): Patient will sustain attention to functional tasks for 5 minutes with mod A verbal/visual cues for redirection. SLP Short Term Goal 2 (Week 2): Patient will utilize external aids to orient to time/place/situation with mod A multimodal cues SLP Short Term Goal 3 (Week 2): Patient will demonstrate basic problem  solving skills with mod A multimodal cues SLP Short Term Goal 4 (Week 2): Pt will consume current diet with minimal s/sx of aspiration and with supervision verbal cues to implement slow rate of consumption and small bite sizes. SLP Short Term Goal 5 (Week 2): Patient will verbalize wants/needs at the phrase level with overall Mod A multimodal cues.  Weekly Progress Updates: Patient has made functional gains and has met 6 of 6 STGs this reporting period. Currently, patient demonstrates behaviors most consistent with a Rancho Level VI and requires overall Max A multimodal cues to complete functional and familiar tasks safely in regards to attention, initiation, functional problem solving, recall with use of external aids and emergent awareness. Patient is now decannulated and his overall speech intelligibility is 100%, however, patient requires Mod verbal cues to initiate verbal responses at times, especially when fatigued. Patient is currently consuming regular textures with thin liquids with minimal overt s/s of aspiration and requires Min-Mod A verbal cues for use of swallowing compensatory strategies. Patient and family education ongoing. Patient would benefit from continued skilled SLP intervention to maximize his cognitive and swallowing function prior to discharge.    Intensity: Minumum of 1-2 x/day, 30 to 90 minutes Frequency: 3 to 5 out of 7 days Duration/Length of Stay: 9/23 Treatment/Interventions: Cognitive remediation/compensation;Environmental controls;Internal/external aids;Speech/Language facilitation;Cueing hierarchy;Dysphagia/aspiration precaution training;Functional tasks;Patient/family education;Therapeutic Activities   Daily Session  Skilled Therapeutic Interventions:  Skilled treatment session focused on cognitive goals. Upon arrival, patient was awake in the wheelchair and agreeable to treatment session.  SLP facilitated session by providing extra time for patient  to write numbers  onto a calendar to utilize for orientation to date. Patient requested to use the bathroom and was continent of both bowel and bladder. SLP also facilitated session by providing supervision level question cues for patient to identify problems and generate possible solutions. Overall, patient requires extra time and Mod A multimodal cues for verbal engagement throughout session. Patient left upright in wheelchair with his father present. Continue with current plan of care.   Pain No/Denies Pain   Therapy/Group: Individual Therapy  Mickey Hebel 09/26/2021, 6:53 AM

## 2021-09-26 NOTE — Progress Notes (Signed)
Patient ID: Jeff [name], male   DOB: Dec 01, 2000, 21 y.o.   MRN: [id number]  Sw received phone call from [facility] Manager [phone]) to discuss PCS request. Reports someone else will likely follow-up since pt remains in hospital. SW provided d/c date and contact information for family for them to reach out after d/c to schedule home visit. Reports all referrals go through Member services; no orders are required.   Cecile Sheerer, MSW, LCSWA Office: [phone] Cell: [phone] Fax: [phone]

## 2021-09-26 NOTE — Progress Notes (Signed)
Occupational Therapy Weekly Progress Note  Patient Details  Name: Jeff [name] MRN: [id number] Date of Birth: January 04, 2001  Beginning of progress report period: September 20, 2021 End of progress report period: September 28, 2021  Today's Date: 09/26/2021 OT Individual Time: 9326-7124 OT Individual Time Calculation (min): 75 min    Patient has met 4 of 4 short term goals.  Pt is making good progress this reporting period improving functional mobility with no AD and MOD A overall for stand and scoot pivots, donning shirt with MOD A and pants with MAX A and toileting remains total A. Pt has initiated AE training to improve independence with BADLs, but remains restless + flat affect requiring encouragement for participation and cuing to maintain precautions for RLE. Pt does have activation of R shoulder in a mobile arm support and in gravity eliminated positions but is working on adaptive strategies to improve performance of BADLs.   Patient continues to demonstrate the following deficits: muscle weakness, decreased cardiorespiratoy endurance, impaired timing and sequencing, unbalanced muscle activation, and decreased coordination, decreased visual perceptual skills, decreased attention, decreased awareness, decreased problem solving, decreased safety awareness, decreased memory, delayed processing, and demonstrates behaviors consistent with Rancho Level 6-7, and decreased sitting balance, decreased standing balance, decreased postural control, decreased balance strategies, and difficulty maintaining precautions and therefore will continue to benefit from skilled OT intervention to enhance overall performance with BADL, iADL, and Reduce care partner burden.  Patient progressing toward long term goals..  Continue plan of care.  OT Short Term Goals Week 1:  OT Short Term Goal 1 (Week 1): Pt will compelte bed<>chair transfer wiht MOD A of 1 in prep for toielting OT Short Term Goal 1 -  Progress (Week 1): Met OT Short Term Goal 2 (Week 1): Pt will don shirt wiht MOD A OT Short Term Goal 2 - Progress (Week 1): Met OT Short Term Goal 3 (Week 1): Pt will groom with supervision OT Short Term Goal 3 - Progress (Week 1): Met OT Short Term Goal 4 (Week 1): Pt will sit EOM with MIN A for 5 min during functional task OT Short Term Goal 4 - Progress (Week 1): Met Week 2:  OT Short Term Goal 1 (Week 2): pt will complete 3/4 steps of donning shirt with AE PRN OT Short Term Goal 2 (Week 2): Pt will thread BLE wiht no A for managing RLE and AE PRN OT Short Term Goal 3 (Week 2): Pt will complete 1/3 components of toileting OT Short Term Goal 4 (Week 2): Pt will transfer to [facility] with MIN A  Skilled Therapeutic Interventions/Progress Updates:     Pt received in bed with dad present and no pain! Pt agreeable to BADL.  ADL: Pt completes all transfers with MOD A to power up EOB<>w/c<>BSC. Pt completes standing while OT manages pants for toilet and cleanses buttocks in standing with MIN A for maintaining balance. While voiding pt does well with trash can under RLE. Pt grooms at sink with cuing for 2 cup method d/t hip precautions. Pt doff shirt with MAX A and dons with MIN A using reacher to thread head. Pt able ot thread BLE with MIN A and OT advancas pants past hips in standing. MOD A SPT back to bed.    Pt left at end of session in bed with exit alarm on, call light in reach and all needs met   Therapy Documentation Precautions:  Precautions Precautions: Posterior Hip, Fall Precaution Booklet Issued: Yes (comment)  Precaution Comments: trach, TBI, strict R posterior hip precautions no adduction Required Braces or Orthoses: Knee Immobilizer - Right Knee Immobilizer - Right: Other (comment) Splint/Cast: R thumb spica, LUE has two splint options- Radial Nerve palsy with max wear time 2 hours and a wrist cock up splint. must sleep in the wrist cock up at night Restrictions Weight Bearing  Restrictions: Yes RUE Weight Bearing: Weight bearing as tolerated LUE Weight Bearing: Weight bearing as tolerated RLE Weight Bearing: Non weight bearing LLE Weight Bearing: Weight bearing as tolerated Other Position/Activity Restrictions: Per [name] on 09/12/21 okay for weight L UE with transfers and on R UE with splint on L LE WBAT, NWB and hip precautions R LE - need to clarify.   Therapy/Group: Individual Therapy  [name] 09/26/2021, 8:13 AM
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
## 2021-09-26 NOTE — Progress Notes (Signed)
Physical Therapy TBI Note  Patient Details  Name: Jeff [name] MRN: [id number] Date of Birth: Feb 03, 2000  Today's Date: 09/26/2021 PT Individual Time: 1015-1035 PT Individual Time Calculation (min): 20 min   Short Term Goals: Week 1:  PT Short Term Goal 1 (Week 1): Pt will perform bed mobilty with mod assist PT Short Term Goal 2 (Week 1): Pt will remain out of bed >2 hours between therapies PT Short Term Goal 3 (Week 1): Pt will remain awake for entire PT treatment PT Short Term Goal 4 (Week 1): Pt will transfer to [facility] with mod assist Week 2:    Week 3:     Skilled Therapeutic Interventions/Progress Updates: 25 min missed time due to fatigue.   Pt initially sleeping soundly and father stated pt had "bad night" requested pt be allowed to sleep.  Therapist returned later in am, pt waking from nap.  Performed the following: Pt supine to sit w/use of rail and min assist.  Scoots to edge w/cga. Dons L wrist splint w/total assist from therapist.  Pt a bit slow to waken fully.  In sitting performed AROM R ankle DF/PF w/gentle stretching of gastroc performed by therapist/1 min holds w/stretch.   Squat pivot w/set up and cga, adheres well to wbing without cues required.  Pt transported to nurses station and handed off to ST for scheduled session.    Therapy Documentation Precautions:  Precautions Precautions: Posterior Hip, Fall Precaution Booklet Issued: Yes (comment) Precaution Comments: trach, TBI, strict R posterior hip precautions no adduction Required Braces or Orthoses: Knee Immobilizer - Right Knee Immobilizer - Right: Other (comment) Splint/Cast: R thumb spica, LUE has two splint options- Radial Nerve palsy with max wear time 2 hours and a wrist cock up splint. must sleep in the wrist cock up at night Restrictions Weight Bearing Restrictions: Yes RUE Weight Bearing: Weight bearing as tolerated LUE Weight Bearing: Weight bearing as tolerated RLE Weight Bearing:  Non weight bearing LLE Weight Bearing: Weight bearing as tolerated Other Position/Activity Restrictions: Per [name] on 09/12/21 okay for weight L UE with transfers and on R UE with splint on L LE WBAT, NWB and hip precautions R LE - need to clarify.   Therapy/Group: Individual Therapy Rada Hay, PT   Shearon Balo 09/26/2021, 12:54 PM

## 2021-09-26 NOTE — Progress Notes (Signed)
Physical Therapy TBI Note  Patient Details  Name: Jeff [name] MRN: [id number] Date of Birth: 08-27-2000  Today's Date: 09/26/2021 PT Individual Time: 1430-1456 PT Individual Time Calculation (min): 26 min   Short Term Goals: Week 1:  PT Short Term Goal 1 (Week 1): Pt will perform bed mobilty with mod assist PT Short Term Goal 2 (Week 1): Pt will remain out of bed >2 hours between therapies PT Short Term Goal 3 (Week 1): Pt will remain awake for entire PT treatment PT Short Term Goal 4 (Week 1): Pt will transfer to [facility] with mod assist  Skilled Therapeutic Interventions/Progress Updates:   Received pt sidelying in bed with dad present at bedside. Pt agreeable to PT treatment and reported pain 6/10 in bilateral legs (R>L) during session (premedicated). Per RN, wound vac was just removed. Session with emphasis on functional mobility/transfers and generalized strengthening. Performed x 10 supine glute sets then pt transferred supine<>sitting EOB with mod A (caution to adhere to posterior hip precautions) and pt stood x 3 reps with RW and min A. Pt with poor adherence to NWB precautions on RLE on trial 1 but on trials 2/3 placed therapist's foot under pt's foot and pt demonstrated good adherence. Pt's father donned soft ankle brace to RLE that he found in room and noticed that it appeared to help with pt's foot pain. Pt performed x 4 lateral scoots to HOB while adhering to RLE NWB precautions, then transferred sit<>supine with mod A for BLE management. Concluded session with pt supine in bed, needs within reach, and bed alarm on. Dad present at bedside.   Therapy Documentation Precautions:  Precautions Precautions: Posterior Hip, Fall Precaution Booklet Issued: Yes (comment) Precaution Comments: trach, TBI, strict R posterior hip precautions no adduction Required Braces or Orthoses: Knee Immobilizer - Right Knee Immobilizer - Right: Other (comment) Splint/Cast: R thumb spica, LUE  has two splint options- Radial Nerve palsy with max wear time 2 hours and a wrist cock up splint. must sleep in the wrist cock up at night Restrictions Weight Bearing Restrictions: Yes RUE Weight Bearing: Weight bearing as tolerated LUE Weight Bearing: Weight bearing as tolerated RLE Weight Bearing: Non weight bearing LLE Weight Bearing: Weight bearing as tolerated Other Position/Activity Restrictions: Per [name] on 09/12/21 okay for weight L UE with transfers and on R UE with splint on L LE WBAT, NWB and hip precautions R LE - need to clarify. Agitated Behavior Scale: TBI Observation Details Observation Environment: pt's room Start of observation period - Date: 09/26/21 Start of observation period - Time: 1430 End of observation period - Date: 09/26/21 End of observation period - Time: 1500 Agitated Behavior Scale (DO NOT LEAVE BLANKS) Short attention span, easy distractibility, inability to concentrate: Present to a slight degree Impulsive, impatient, low tolerance for pain or frustration: Absent Uncooperative, resistant to care, demanding: Absent Violent and/or threatening violence toward people or property: Absent Explosive and/or unpredictable anger: Absent Rocking, rubbing, moaning, or other self-stimulating behavior: Absent Pulling at tubes, restraints, etc.: Absent Wandering from treatment areas: Absent Restlessness, pacing, excessive movement: Absent Repetitive behaviors, motor, and/or verbal: Absent Rapid, loud, or excessive talking: Absent Sudden changes of mood: Absent Easily initiated or excessive crying and/or laughter: Absent Self-abusiveness, physical and/or verbal: Absent Agitated behavior scale total score: 15   Therapy/Group: Individual Therapy Martin Majestic PT, DPT  09/26/2021, 7:18 AM

## 2021-09-27 ENCOUNTER — Inpatient Hospital Stay (HOSPITAL_COMMUNITY): Payer: Medicaid Other

## 2021-09-27 DIAGNOSIS — S069X3S Unspecified intracranial injury with loss of consciousness of 1 hour to 5 hours 59 minutes, sequela: Secondary | ICD-10-CM | POA: Diagnosis not present

## 2021-09-27 MED ORDER — COLLAGENASE 250 UNIT/GM EX OINT
TOPICAL_OINTMENT | Freq: Every day | CUTANEOUS | Status: DC
  Filled 2021-09-27: qty 30

## 2021-09-27 MED ORDER — TRAZODONE HCL 50 MG PO TABS
100.0000 mg | ORAL_TABLET | Freq: Every day | ORAL | Status: DC
  Administered 2021-09-27 – 2021-10-01 (×5): 100 mg via ORAL
  Filled 2021-09-27 (×5): qty 2

## 2021-09-27 MED ORDER — OXYCODONE HCL 5 MG PO TABS
5.0000 mg | ORAL_TABLET | ORAL | Status: DC | PRN
  Filled 2021-09-27: qty 2

## 2021-09-27 NOTE — Progress Notes (Signed)
Notifed MD of x-Ray results.   Tinnie Gens, LPN

## 2021-09-27 NOTE — Progress Notes (Signed)
Occupational Therapy TBI Note  Patient Details  Name: Jeff [name] MRN: [id number] Date of Birth: January 31, 2000  Today's Date: 09/27/2021 OT Individual Time: 7026-3785 OT Individual Time Calculation (min): 57 min    Short Term Goals: Week 1:  OT Short Term Goal 1 (Week 1): Pt Jeff compelte bed<>chair transfer wiht MOD A of 1 in prep for toielting OT Short Term Goal 1 - Progress (Week 1): Met OT Short Term Goal 2 (Week 1): Pt Jeff don shirt wiht MOD A OT Short Term Goal 2 - Progress (Week 1): Met OT Short Term Goal 3 (Week 1): Pt Jeff groom with supervision OT Short Term Goal 3 - Progress (Week 1): Met OT Short Term Goal 4 (Week 1): Pt Jeff sit EOM with MIN A for 5 min during functional task OT Short Term Goal 4 - Progress (Week 1): Met  Skilled Therapeutic Interventions/Progress Updates:    OF NOTE: Pt without KI on when entering room and KI placed under shoes. Pt to wear KI at all times except in therapy. Dad reporting that night nurse allowed pt to doff? Followed up with day shift about precautions and to have him keep it on at night despite restlessness  Pt received in bed with unrated R hips pain. Rest and repositioning ADL: Pt completes sup>sit by helicoptering Les with OT and pulling on bed rail to elevate trunk. Pt then uses reacher to thread Les into pants with A to maintain RLE support but S overall. Pt STS with RW and MOD A to power up and OT advances pants past hips. Poor adherence to WB precautions therefore sat back down and completed SPT with no AD to TIS.   Therapeutic activity Pt transported to outside courtyard with total A for time management and pt educated on light, sound and temp sensitivity as pt reporting sunlight bothering eyes. Pt stated goal before returning home was to "run" and educated that that Jeff be a long term goal as pt still needing orthopedic support for R hip. Pt then states "getting up and down by myself" which is more appropriate. Pt  practices 3 sit to stands with RW and MIN A with better management of RLE.   Pt left at end of session in TIS dad in room declining belt alarm. LPN aware of position and to assist back to bed for dressing changes as needed.    Therapy Documentation Precautions:  Precautions Precautions: Posterior Hip, Fall Precaution Booklet Issued: Yes (comment) Precaution Comments: trach, TBI, strict R posterior hip precautions no adduction Required Braces or Orthoses: Knee Immobilizer - Right Knee Immobilizer - Right: Other (comment) Splint/Cast: R thumb spica, LUE has two splint options- Radial Nerve palsy with max wear time 2 hours and a wrist cock up splint. must sleep in the wrist cock up at night Restrictions Weight Bearing Restrictions: Yes RUE Weight Bearing: Weight bearing as tolerated LUE Weight Bearing: Weight bearing as tolerated RLE Weight Bearing: Non weight bearing LLE Weight Bearing: Weight bearing as tolerated Other Position/Activity Restrictions: Per [name] on 09/12/21 okay for weight L UE with transfers and on R UE with splint on L LE WBAT, NWB and hip precautions R LE - need to clarify. General:   Vital Signs: Therapy Vitals Temp: 98.4 F (36.9 C) Temp Source: Oral Pulse Rate: 96 Resp: 14 BP: 114/77 Patient Position (if appropriate): Lying Oxygen Therapy SpO2: 97 % O2 Device: Room Air Pain: Pain Assessment Pain Scale: 0-10 Pain Score: Asleep Agitated Behavior Scale: TBI ABS  discontinued d/t ABS score less than 20 for the last three days or no behaviors present        Therapy/Group: Individual Therapy  [name] 09/27/2021, 6:54 AM

## 2021-09-27 NOTE — Op Note (Signed)
NAMEOSWELL, SAY MEDICAL RECORD NO: [id number] ACCOUNT NO: [id number] DATE OF BIRTH: September 12, 2000 FACILITY: [facility] LOCATION: [facility] PHYSICIAN: Doralee Albino. Carola Frost, MD  Operative Report   DATE OF PROCEDURE: 08/14/2021  PREOPERATIVE DIAGNOSES: 1.  Left segmental humerus fracture. 2.  Left transolecranon fracture dislocation. 3.  Polytrauma, status post multiple procedures.  POSTOPERATIVE DIAGNOSES: 1.  Left segmental humerus fracture. 2.  Left transolecranon fracture dislocation. 3.  Polytrauma, status post multiple procedures.  PROCEDURES: 1.  ORIF of left supracondylar humerus. 2.  ORIF of left humeral shaft. 3.  Open treatment of left elbow dislocation. 4.  ORIF of olecranon.  SURGEON:  Myrene Galas, MD  ASSISTANT:  PA student.  ANESTHESIA:  General.  COMPLICATIONS:  None.  TOURNIQUET:  None.  PATIENT DISPOSITION:  To ICU.  CONDITION:  Critical.  BRIEF SUMMARY OF INDICATION FOR PROCEDURE:  The patient is a 21 year old male well known to the orthopedic trauma service for multiple injuries including a right posterior wall acetabular fracture dislocation that is treated in traction because of a  severe degloving wound of the right thigh as well as right open thumb metacarpal, left segmental humerus fracture and a transolecranon elbow dislocation.  I discussed with the patient's family the risks and benefits of surgical treatment of the left  upper extremity at this time including the potential for nerve injury, vessel injury, loss of motion, arthritis, nonunion, malunion, a complication related to his continued ECMO, which could include death, heart attack or stroke and many others.  They  acknowledged these risks and did provide consent to proceed.  BRIEF SUMMARY OF PROCEDURE:  The patient was taken to the operating room where anesthesia was switched to the in-room machine.  The left upper extremity was carefully removed from the splint and prepped and draped in the  usual sterile fashion.  We  selected a supine approach that would allow and facilitate exposure of both the supracondylar fracture as well as the more proximal shaft fracture as well as treat the elbow dislocation and olecranon.  No tourniquet was used during the procedure.   Following a chlorhexidine wash, Betadine scrub and paint was performed.  Timeout was held.  We began the operation with an approach to the olecranon.  Here, a continued need for ECMO support.  I began with an anterior incision to the humerus, which was  extensile.  The cephalic vein was protected.  Biceps retracted.  The brachialis split midline to expose the fracture site proximally.  I continued this dissection then distally down toward the transverse antecubital crease being careful to control for  bleeders and avoiding injury to the nerve and vessel.  I was able to palpate the superior or proximal aspect of the capitellum and exposed that fracture site as well.  A curette was used to remove the hematoma from within, supplemented with a copious  lavage.  Mini frag fixation with [employer] mini frag set was then performed, securing using lag screw technique with multiple mini frag screws, first at the supracondylar level, then more proximally to achieve an anatomic and near anatomic reduction and  excellent alignment.  This was followed by placement of a long [employer] LC-DCP plate.  I was able to secure 6 cortices of purchase distal to the fracture, 2 of which were locked and then 8 proximally.  AP and lateral views showed anatomic reduction of  both the distal supracondylar component as well as the more proximal shaft component.  Fortunately able to use one implant rather than  two implants through two approaches as might be needed, which was particularly advantageous given his tenuous ECMO support.  Next, we turned  attention to the elbow.  Here, a dorsal approach was made to the olecranon.  I was able to obtain an anatomic reduction  after first cleaning the fracture site with curette and lavage and then pieces together with a tenaculum.  This was secured first  with K-wire fixation provisionally and then the hook plate from [employer] achieving excellent compression.  The lateral ulnar collateral ligament was repaired back to the ulna restoring stability there.  The elbow was then taken to through a range of motion  and was found to be stable, was repaired back with a suture anchor, no additional fixation up at the lateral epicondyle was required as the avulsion was readily recognized from the ulna.  All wounds were irrigated thoroughly and closed in standard  layered fashion.  A long arm splint was applied.  The patient was awakened from anesthesia and transported to PACU in stable condition.  The patient remained intubated and was taken to the ICU in critical, but hemodynamically stable condition.  PROGNOSIS: Fortunately able to achieve a repair of the elbow and both fractures in the humerus.  We anticipate removing the splint and allowing range of motion within the week turned.  He will continue with traction for the right hip and we will follow  along with our trauma colleagues.  Risks, complications remained extremely elevated.   NIK D: 09/27/2021 5:45:26 pm T: 09/27/2021 11:44:00 pm  JOB: [id number]/ [id number]

## 2021-09-27 NOTE — Progress Notes (Signed)
Patient very restless last night. PRN trazodone given but ineffective. Patient went to sleep arounf 0400am. Patient currently asleep in bed with Dad at bedside, call bell at bedside within reach. Have continued with plan of care.
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
## 2021-09-28 DIAGNOSIS — S069X3S Unspecified intracranial injury with loss of consciousness of 1 hour to 5 hours 59 minutes, sequela: Secondary | ICD-10-CM | POA: Diagnosis not present

## 2021-09-28 MED ORDER — PROPRANOLOL HCL 20 MG PO TABS
40.0000 mg | ORAL_TABLET | Freq: Three times a day (TID) | ORAL | Status: DC
  Administered 2021-09-28 – 2021-10-11 (×39): 40 mg via ORAL
  Filled 2021-09-28 (×41): qty 2
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
## 2021-09-28 NOTE — Op Note (Deleted)
  The note originally documented on this encounter has been moved the the encounter in which it belongs.  
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
## 2021-09-28 NOTE — Progress Notes (Signed)
Speech Language Pathology TBI Note  Patient Details  Name: Eashan Schipani MRN: [id number] Date of Birth: [birth year]  Today's Date: 09/28/2021 SLP Individual Time: 0600-4599 SLP Individual Time Calculation (min): 30 min  Short Term Goals: Week 2: SLP Short Term Goal 1 (Week 2): Patient will sustain attention to functional tasks for 5 minutes with mod A verbal/visual cues for redirection. SLP Short Term Goal 2 (Week 2): Patient will utilize external aids to orient to time/place/situation with mod A multimodal cues SLP Short Term Goal 3 (Week 2): Patient will demonstrate basic problem solving skills with mod A multimodal cues SLP Short Term Goal 4 (Week 2): Pt will consume current diet with minimal s/sx of aspiration and with supervision verbal cues to implement slow rate of consumption and small bite sizes. SLP Short Term Goal 5 (Week 2): Patient will verbalize wants/needs at the phrase level with overall Mod A multimodal cues.  Skilled Therapeutic Interventions:  Pt was seen for skilled ST targeting cognitive goals.  Pt was resting in bed upon therapist's arrival but awakened easily to voice and was agreeable to participating in treatment.  Pt was oriented to place, date (month and year), and situation with mod assist multimodal cues.  SLP facilitated the session with a novel card game targeting basic problem solving and sustained attention.  Pt could identify matches between cards and make basic mental math calculations with mod assist multimodal cues.  Pt sustained his attention to task for ~3-4 minute intervals before requiring min cues for redirection.  Pt independently initiated asking x1 appropriate question to clarify rules of card game.  Pt was left in bed with bed alarm set and call bell within reach.  Father returning to room as therapist was departing.  Continue per current plan of care.    Pain Pain Assessment Pain Scale: Faces Pain Score: 6  Faces Pain Scale: Hurts  little more Pain Type: Acute pain Pain Location: Leg Pain Orientation: Right Pain Descriptors / Indicators: Aching Pain Intervention(s): RN made aware  Agitated Behavior Scale: TBI Observation Details Observation Environment: CIR Start of observation period - Date: 09/28/21 Start of observation period - Time: 0915 End of observation period - Date: 09/28/21 End of observation period - Time: 0945 Agitated Behavior Scale (DO NOT LEAVE BLANKS) Short attention span, easy distractibility, inability to concentrate: Present to a slight degree Impulsive, impatient, low tolerance for pain or frustration: Absent Uncooperative, resistant to care, demanding: Absent Violent and/or threatening violence toward people or property: Absent Explosive and/or unpredictable anger: Absent Rocking, rubbing, moaning, or other self-stimulating behavior: Absent Pulling at tubes, restraints, etc.: Absent Wandering from treatment areas: Absent Restlessness, pacing, excessive movement: Absent Repetitive behaviors, motor, and/or verbal: Absent Rapid, loud, or excessive talking: Absent Sudden changes of mood: Absent Easily initiated or excessive crying and/or laughter: Absent Self-abusiveness, physical and/or verbal: Absent Agitated behavior scale total score: 15  Therapy/Group: Individual Therapy  Alfio Loescher, Melanee Spry 09/28/2021, 12:25 PM
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
## 2021-09-28 NOTE — Op Note (Signed)
NAMEJETTSON, CRABLE MEDICAL RECORD NO: [id number] ACCOUNT NO: [id number] DATE OF BIRTH: [birth year] FACILITY: [facility] LOCATION: [facility] PHYSICIAN: Astrid Divine. Marcelino Scot, MD  Operative Report   DATE OF PROCEDURE: 08/12/2021  PREOPERATIVE DIAGNOSES:   1.  Right thigh degloving wound. 2.  Right posterior wall acetabular fracture. 3.  Retained traction pin. 4.  Segmental left humerus. 5.  Left transolecranon elbow dislocation. 6.  Right open first metacarpal fracture, status post pinning. 7.  Significant lung injury, on ECMO.  POSTOPERATIVE DIAGNOSES:   1.  Right thigh degloving wound. 2.  Right posterior wall acetabular fracture. 3.  Retained traction pin. 4.  Segmental left humerus. 5.  Left transolecranon elbow dislocation. 6.  Right open first metacarpal fracture, status post pinning. 7.  Significant lung injury, on ECMO.  PROCEDURE:  Tracheostomy by Dr. Grandville Silos and Dr. Bobbye Morton.  Please refer to their separate dictation.   ORTHOPEDIC PROCEDURES:   1.  Debridement of right hip open wound including removal of skin, subcutaneous tissue and fascia. 2.  Application of biologic graft 40 x 20 cm (800 cm2). 3.  Dressing change under anesthesia, right thigh. 4.  Removal and reinsertion of right tibia traction pin.  SURGEON:  Astrid Divine. Marcelino Scot, MD.  ASSISTANT:  Izola Price, RNFA.  Please refer to anesthesia detailed report for a complete I and O.  PATIENT DISPOSITION:  To ICU.  CONDITION:  Critical.  BRIEF SUMMARY OF PROCEDURE:  The patient is well known to the Orthopedic Trauma Service and presents for attempted ORIF of an unstable large right posterior wall acetabular fracture.  We coordinated this procedure with our general surgery, trauma  colleagues, Dr. Bobbye Morton and Dr. Grandville Silos, who performed tracheostomy.  The patient remains on ECMO and in critical condition.  We were concerned specifically about positioning the patient on his side given the ongoing ECMO.  The risks and  benefits of  surgery were discussed with the patient's family preoperatively and consent was obtained to proceed.  BRIEF SUMMARY OF PROCEDURE:  The patient was taken to the operating room where anesthesia was switched to in-room machine.  The tracheostomy was performed as noted above.  Please refer to that dictation.  With regard to the hip as the patient remained  stable, we carefully placed him right side up and removed the traction pin.  Per the report of Dr. Doreatha Martin, there was an extensive wound, but the possibility of being able to proceed with definitive fixation and a grossly unstable hip.  The left arm in a  long splint was carefully positioned to avoid any damage to the arm at the fracture site, particularly the neurovascular bundle.  Standard prep and drape was performed.  Timeout was held.  Traction was held on the leg during prepping.  With removal of  the dressing, the extensive wound was immediately visible.  There was complete loss of skin, subcutaneous tissue all the way down to the fascia, which measured 20 x 20 cm.  Unfortunately, the wound also extended an additional 20 x 20 cm underneath the  fascia of the subcutaneous muscle border all the way up to the pelvic brim from anterior to posterior.  As such, there was absolutely no way to safely approach the posterior wall acetabular fracture.  A scalpel was used to remove some additional skin,  subcutaneous muscle, which was demarcated as well as some traumatized fascia along the vastus.  This area was irrigated thoroughly and then extensive biologic graft placed with Myriad Morcells initially and then the  sheet.  We used two 20 x 20 layers of  that material in addition to the Morcells filling the entire surface area.  The VAC dressing change was then completed under anesthesia.  Because of the grossly unstable acetabular fracture, I then had to reinsert the traction pin to allow continued  traction.  C-arm did confirm reduction.  After  careful deliberation with my trauma colleagues, decision was made to return the patient to the ICU at this time rather than subject him to additional OR time and a change of position to address the left  humerus and olecranon.  PROGNOSIS:  The patient has horrendous skin injury, which precludes safe approach to the acetabulum without excessive risk of infection.  Likely, this will have to be continued in traction and treated closed until such time as safe approach could be done  and may need to go straight to total hip arthroplasty in which case a noninfected bed remains paramount.  We will try to return to the OR for repair of his left arm within the next several days.     PAA D: 09/27/2021 5:27:56 pm T: 09/28/2021 1:05:00 am  JOB: [id number]/ [id number]

## 2021-09-28 NOTE — Progress Notes (Signed)
Pharmacy Consult - Lovenox  With DVT, on modified dose of Lovenox 100mg  Q12 CBC stable (8/28 Hgb 12.5 and plts 406) Scr <1   Plan: Continue Lovenox 100 mg sq Q 12 hours Follow CBC  Thank you. 01-30-1971, PharmD. [facility] PGY-1  09/28/2021 11:37 AM
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
## 2021-09-28 NOTE — Progress Notes (Signed)
On-Call MD aware of drainage from dressing on right leg.   Tilden Dome, LPN

## 2021-09-28 NOTE — Progress Notes (Signed)
Physical Therapy Weekly Progress Note  Patient Details  Name: Jeff [name] MRN: [id number] Date of Birth: [birth year]  Beginning of progress report period: September 20, 2021 End of progress report period: September 28, 2021  Today's Date: 09/28/2021 PT Individual Time: 1005-1100 PT Individual Time Calculation (min): 55 min   Patient has met 4 of 4 short term goals.  Patient will steady progress this week. Demonstrates with consistent orientation to self, place, and time, appears to confuse this hospital admission with events leading to a previous admission. He currently performs bed mobility and squat pivot and sit to stand transfer with min A. Limited progress due to decreased arousal, polytrauma with weight bearing precautions and muscular weakness, and elevated pain levels. Patient's family has been present every day, initiated basic TBI, precautions, and bed positioning education. Will initiate hands on training closer to d/c.   Patient continues to demonstrate the following deficits muscle weakness and muscle joint tightness, decreased cardiorespiratoy endurance, abnormal tone, decreased initiation, decreased attention, decreased awareness, decreased problem solving, decreased safety awareness, decreased memory, delayed processing, and demonstrates behaviors consistent with [place] Level [place], and decreased sitting balance, decreased standing balance, decreased postural control, and decreased balance strategies and therefore will continue to benefit from skilled PT intervention to increase functional independence with mobility.  Patient progressing toward long term goals.  Continue plan of care. Upgraded LTG to CGA-supervision overall.  PT Short Term Goals Week 1:  PT Short Term Goal 1 (Week 1): Pt will perform bed mobilty with mod assist PT Short Term Goal 1 - Progress (Week 1): Met PT Short Term Goal 2 (Week 1): Pt will remain out of bed >2 hours between therapies PT Short Term  Goal 2 - Progress (Week 1): Met PT Short Term Goal 3 (Week 1): Pt will remain awake for entire PT treatment PT Short Term Goal 3 - Progress (Week 1): Met PT Short Term Goal 4 (Week 1): Pt will transfer to [facility] with mod assist PT Short Term Goal 4 - Progress (Week 1): Met Week 2:  PT Short Term Goal 1 (Week 2): Patient will perform bed mobility with CGA consistently. PT Short Term Goal 2 (Week 2): Patient will perform basic transfer with CGA using LRAD. PT Short Term Goal 3 (Week 2): Patient will initiate w/c mobility.  Skilled Therapeutic Interventions/Progress Updates:     0800: Patient in bed asleep with his father in the room. Patient difficult to arouse and unable to maintain eyes open with tactile stimulation. Patient breathing evenly and vitals WFL. Patient's father reports that he slept well last night. Opted to change schedule for improved patient participation with increased arousal later this morning. Patient's father in agreement, RN made aware.   Patient in bed upon PT arrival. Patient alert and agreeable to PT session. Patient reported 5-6/10 R hip pain during session, RN made aware. PT provided repositioning, rest breaks, and distraction as pain interventions throughout session.   Therapeutic Activity: Bed Mobility: Patient performed supine to/from sit with min/mod A for trunk and lower extremity management. Provided verbal cues for sequencing and initiation. Transfers: Patient performed squat pivot bed<>TIS w/c with CGA and set-up assist. He performed stand pivot w/c<>BSC over toilet using B upper extremities on the grab bar with CGA-min A. Maintained weight bearing precautions throughout with x2 cues in standing. Provided verbal cues and demonstration for transfer technique, initiation, and hand placement.  Patient performed peg board task >15 min to match the board to a picture using 5 rows  of colors. Patient placed 3 rows in incorrect order before requesting to go to the bathroom.  Provided cues for pincer grasp technique and use of UE ROM rather than trunk flexion to reach for pegs and the board. PT retrieved manual w/c while patient completed the task for initiation of w/c mobility (unable due to toileting this session).  Patient was continent of bowel and bladder during toileting. Performed peri-care and lower body clothing management with total A with min A-CGA for standing balance.   Patient in bed preparing for dressing changes by RN at end of session with breaks locked, bed alarm set, 4 rails up per patient request, and all needs within reach.   ABS: 15  Therapy Documentation Precautions:  Precautions Precautions: Posterior Hip, Fall Precaution Booklet Issued: Yes (comment) Precaution Comments: trach, TBI, strict R posterior hip precautions no adduction Required Braces or Orthoses: Knee Immobilizer - Right Knee Immobilizer - Right: Other (comment) Splint/Cast: R thumb spica, LUE has two splint options- Radial Nerve palsy with max wear time 2 hours and a wrist cock up splint. must sleep in the wrist cock up at night Restrictions Weight Bearing Restrictions: Yes RUE Weight Bearing: Weight bearing as tolerated LUE Weight Bearing: Weight bearing as tolerated RLE Weight Bearing: Non weight bearing LLE Weight Bearing: Weight bearing as tolerated Other Position/Activity Restrictions: Per [name] on 09/12/21 okay for weight L UE with transfers and on R UE with splint on L LE WBAT, NWB and hip precautions R LE - need to clarify.   Therapy/Group: Individual Therapy  Dresden Ament L Lala Been PT, DPT, NCS, CBIS  09/28/2021, 12:42 PM

## 2021-09-29 DIAGNOSIS — S73004A Unspecified dislocation of right hip, initial encounter: Secondary | ICD-10-CM

## 2021-09-29 DIAGNOSIS — S069X3S Unspecified intracranial injury with loss of consciousness of 1 hour to 5 hours 59 minutes, sequela: Secondary | ICD-10-CM

## 2021-09-29 DIAGNOSIS — G479 Sleep disorder, unspecified: Secondary | ICD-10-CM

## 2021-09-29 DIAGNOSIS — Z930 Tracheostomy status: Secondary | ICD-10-CM

## 2021-09-29 DIAGNOSIS — S81809A Unspecified open wound, unspecified lower leg, initial encounter: Secondary | ICD-10-CM

## 2021-09-29 NOTE — Plan of Care (Signed)
  Problem: RH BOWEL ELIMINATION Goal: RH STG MANAGE BOWEL WITH ASSISTANCE Description: STG Manage Bowel with max Assistance. Outcome: Not Progressing; incontinence   Problem: RH SKIN INTEGRITY Goal: RH STG SKIN FREE OF INFECTION/BREAKDOWN Description: Skin will be free of any additional infection/breakdown from admission into rehab Outcome: Progressing; incontinence

## 2021-09-29 NOTE — Progress Notes (Signed)
Occupational Therapy TBI Note  Patient Details  Name: Jeff [name] MRN: [id number] Date of Birth: November 07, 2000  Today's Date: 09/29/2021 OT Individual Time: 1420-1449 OT Individual Time Calculation (min): 29 min    Short Term Goals: Week 2:  OT Short Term Goal 1 (Week 2): pt will complete 3/4 steps of donning shirt with AE PRN OT Short Term Goal 2 (Week 2): Pt will thread BLE wiht no A for managing RLE and AE PRN OT Short Term Goal 3 (Week 2): Pt will complete 1/3 components of toileting OT Short Term Goal 4 (Week 2): Pt will transfer to [facility] with MIN A  Skilled Therapeutic Interventions/Progress Updates:  Pt greeted seated in w/c with father present.  Reviewed WB precautions with pt stating precautions with 100% accuracy but needed assist to recall posterior hip precautions as pt only able to recall "dont cross legs."  session focus on further assessment of splints. Reached out to ortho PA who confirmed L radial nerve palsy splint should be worn as tolerate during the day and R wrist cock up splint to be worn at night.  Donned L radial nerval palsy splint with MOD A with pts gross grasp greatly improved, transported pt to day room with total A to work on functional grasp with splint donned.  Pt able to grasp cup via cylindrical grasp with LUE to simulate ADLS of self feeding with supervision.  Graded task up and had pt work on placing pegs in board with LUE with an emphasis on lateral pinch grasp for higher level ADLs, pt completed task with supervision.  Next, added resistance to task where pt instructed to retrieve small poker chips and press into slotted hole in container to challenge intrinsic hand strength. Pt completed task with supervision.  Additionally issued pt higher level of resistance for the compliant cubes to work on increased grip strength, encouraged pt to keep L splint as pt able to tolerate.  Pt transported back to room with total A where pt left up in w/c  with alarm belt activated and all needs within reach.  Therapy Documentation Precautions:  Precautions Precautions: Posterior Hip, Fall Precaution Booklet Issued: Yes (comment) Precaution Comments: trach, TBI, strict R posterior hip precautions no adduction Required Braces or Orthoses: Knee Immobilizer - Right Knee Immobilizer - Right: Other (comment) Splint/Cast: R thumb spica, LUE has two splint options- Radial Nerve palsy with max wear time 2 hours and a wrist cock up splint. must sleep in the wrist cock up at night Restrictions Weight Bearing Restrictions: Yes RUE Weight Bearing: Weight bearing as tolerated LUE Weight Bearing: Weight bearing as tolerated RLE Weight Bearing: Non weight bearing LLE Weight Bearing: Weight bearing as tolerated Other Position/Activity Restrictions: Per [name] on 09/12/21 okay for weight L UE with transfers and on R UE with splint on L LE WBAT, NWB and hip precautions R LE - need to clarify.  Pain: 8/10 pain reported in BLEs, no intervention needed.     Therapy/Group: Individual Therapy  Barron Schmid 09/29/2021, 3:54 PM

## 2021-09-29 NOTE — Progress Notes (Signed)
Physical Therapy Session Note  Patient Details  Name: Jeff [name] MRN: [id number] Date of Birth: [birth year]  Today's Date: 09/29/2021 PT Individual Time: 0810-0905 PT Individual Time Calculation (min): 55 min   Short Term Goals: Week 2:  PT Short Term Goal 1 (Week 2): Patient will perform bed mobility with CGA consistently. PT Short Term Goal 2 (Week 2): Patient will perform basic transfer with CGA using LRAD. PT Short Term Goal 3 (Week 2): Patient will initiate w/c mobility.  Skilled Therapeutic Interventions/Progress Updates:     Patient in bed set up to eat breakfast with his father in the room upon PT arrival. Patient's father requested time for the patient to eat breakfast. PT returned 10 min later with patient done with breakfast agreeable to PT session. Patient reported 5-6/10 R hip and foot and L thigh pain during session, RN made aware. PT provided repositioning, rest breaks, and distraction as pain interventions throughout session.   Patient's father with questions regarding patient's dressing changes with concerns about soiling them with incontinence. MD made aware during rounds and discussed wound care orders with charge nurse, plans to confirm with ortho team and educate patient and his father. Reapplied ACE wrap to L thigh due to top of wound being uncovered from patient moving throughout the night. Per ortho note on 9/1, d/c R KI at this time. KI not donned during mobility and patient/father educated on this.  Therapeutic Activity: Bed Mobility: Patient performed supine to sit with min A for R lower extremity management and trunk control in a flat bed without use of bed rails. Provided verbal cues for placing a pillow between his knees to prevent hip adduction, progressing through R side-lying, and using his bottom elbow to push up. Transfers: Patient performed squat pivot bed>manual w/c with CGA-close supervision and cues NWB on R, increased cues due to  allowance of knee flexion without KI.   Wheelchair Mobility:  Patient propelled wheelchair 160 feet with CGA-supervision using B upper extremities. Provided verbal cues for propulsion and turning technique. Fit ELRs to patient during session.   Therapeutic Exercise: Patient performed the following exercises with verbal and tactile cues for proper technique. -R heel slides AROM 2x10 -R quad sets 2x10 with 5 sec hold -R SLR AAROM 2x5  Patient in bed due to fatigue at end of session with breaks locked, bed alarm set, and all needs within reach. Patient reports poor sleep last night, agreeable to sit up following back to back sessions later this morning.   Therapy Documentation Precautions:  Precautions Precautions: Posterior Hip, Fall Precaution Booklet Issued: Yes (comment) Precaution Comments: trach, TBI, strict R posterior hip precautions no adduction Required Braces or Orthoses: Knee Immobilizer - Right Knee Immobilizer - Right: Other (comment) Splint/Cast: R thumb spica, LUE has two splint options- Radial Nerve palsy with max wear time 2 hours and a wrist cock up splint. must sleep in the wrist cock up at night Restrictions Weight Bearing Restrictions: Yes RUE Weight Bearing: Weight bearing as tolerated LUE Weight Bearing: Weight bearing as tolerated RLE Weight Bearing: Non weight bearing LLE Weight Bearing: Weight bearing as tolerated Other Position/Activity Restrictions: Per Jeff [name] on 09/12/21 okay for weight L UE with transfers and on R UE with splint on L LE WBAT, NWB and hip precautions R LE - need to clarify.    Therapy/Group: Individual Therapy  Mata Rowen L Olufemi Mofield PT, DPT, NCS, CBIS  09/29/2021, 3:43 PM

## 2021-09-29 NOTE — Progress Notes (Signed)
Physical Therapy TBI Note  Patient Details  Name: Jeff [name] MRN: [id number] Date of Birth: 2001-01-07  Today's Date: 09/29/2021 PT Individual Time: 1101-1201 PT Individual Time Calculation (min): 60 min   Short Term Goals: Week 2:  PT Short Term Goal 1 (Week 2): Patient will perform bed mobility with CGA consistently. PT Short Term Goal 2 (Week 2): Patient will perform basic transfer with CGA using LRAD. PT Short Term Goal 3 (Week 2): Patient will initiate w/c mobility.  Skilled Therapeutic Interventions/Progress Updates:    Chart reviewed and pt agreeable to therapy. Pt received seated in WC with 6/10 c/o pain in RLE. Also of note, pt was oriented to year and general location (hospital), but required assistance on day, month, town, and specific location. Session focused on standing and transfer technique, dual tasking, and standing tolerance to promote safe mobility in setting of precautions. Pt initiated session with review of precautions. Pt then demonstrated WC<>bed SPT using CGA. Pt required VC to maintain NWM on RLE. Pt then completed 6x sit to stand with CGA + rail support. Of note, pt required education on sequencing for pushing off chair with LUE, but was able to maintain technique with light VC t/o session. For final 3x rounds of standing, pt completed tasks of counting and word recall, with some confusion displayed during word recall. Pt then completed 2x5 SAQ with RLE. Pt then transported to hall for time conservation. In hall, pt completed 210ft WC propulsion with CGA and 2x40ft hamstring pull in WC with LLE.  At end of session, pt was left seated in [facility] with family present, nurse call bell and all needs in reach.    Therapy Documentation Precautions:  Precautions Precautions: Posterior Hip, Fall Precaution Booklet Issued: Yes (comment) Precaution Comments: trach, TBI, strict R posterior hip precautions no adduction Required Braces or Orthoses: Knee Immobilizer  - Right Knee Immobilizer - Right: Other (comment) Splint/Cast: R thumb spica, LUE has two splint options- Radial Nerve palsy with max wear time 2 hours and a wrist cock up splint. must sleep in the wrist cock up at night Restrictions Weight Bearing Restrictions: Yes RUE Weight Bearing: Weight bearing as tolerated LUE Weight Bearing: Weight bearing as tolerated RLE Weight Bearing: Non weight bearing LLE Weight Bearing: Weight bearing as tolerated Other Position/Activity Restrictions: Per [name] on 09/12/21 okay for weight L UE with transfers and on R UE with splint on L LE WBAT, NWB and hip precautions R LE - need to clarify.  Agitated Behavior Scale: TBI Observation Details Observation Environment: pt's room Start of observation period - Date: 09/29/21 Start of observation period - Time: 1101 End of observation period - Date: 09/29/21 End of observation period - Time: 1201 Agitated Behavior Scale (DO NOT LEAVE BLANKS) Short attention span, easy distractibility, inability to concentrate: Present to a slight degree Impulsive, impatient, low tolerance for pain or frustration: Absent Uncooperative, resistant to care, demanding: Absent Violent and/or threatening violence toward people or property: Absent Explosive and/or unpredictable anger: Absent Rocking, rubbing, moaning, or other self-stimulating behavior: Absent Pulling at tubes, restraints, etc.: Absent Wandering from treatment areas: Absent Restlessness, pacing, excessive movement: Absent Repetitive behaviors, motor, and/or verbal: Absent Rapid, loud, or excessive talking: Absent Sudden changes of mood: Absent Easily initiated or excessive crying and/or laughter: Absent Self-abusiveness, physical and/or verbal: Absent Agitated behavior scale total score: 15    Therapy/Group: Individual Therapy  Dionne Milo, PT, DPT 09/29/2021, 12:43 PM

## 2021-09-29 NOTE — Progress Notes (Signed)
Occupational Therapy TBI Note  Patient Details  Name: Jeff [name] MRN: [id number] Date of Birth: 09-24-2000  Today's Date: 09/29/2021 OT Individual Time: 1015-1100 OT Individual Time Calculation (min): 45 min    Short Term Goals: Week 1:  OT Short Term Goal 1 (Week 1): Pt will compelte bed<>chair transfer wiht MOD A of 1 in prep for toielting OT Short Term Goal 1 - Progress (Week 1): Met OT Short Term Goal 2 (Week 1): Pt will don shirt wiht MOD A OT Short Term Goal 2 - Progress (Week 1): Met OT Short Term Goal 3 (Week 1): Pt will groom with supervision OT Short Term Goal 3 - Progress (Week 1): Met OT Short Term Goal 4 (Week 1): Pt will sit EOM with MIN A for 5 min during functional task OT Short Term Goal 4 - Progress (Week 1): Met  Skilled Therapeutic Interventions/Progress Updates:    Pt greeted semi-reclined in bed with father present and agreeable to OT treatment session. Pt declined bathing/dressing tasks. Pt completed bed mobility with OT assist to advance LE's and min A to elevate trunk into sitting. Squat-pivot to drop arm wc with min A and cues for WB precautions. Pt brought to the sink for grooming tasks. Min cues for sequencing. Focus on fine motor coordination with opening and closing containers. Pt was able to grasp toothbrush w. R hand and bring R UE to mouth with R elbow supported. Pt brought to therapy gym and worked on [employer], sequencing, and R UE NMR using BITs activity. Pt needed min guided A from OT to reach forward and touch screen. Pt able to recall up to 7 words in order. R UE NMR and strengthening with towel pushes across mat, then up small wedge. Focus on increased ROM and shoulder activation. Pt returned to room and left seated in wc with father present and needs met.   Therapy Documentation Precautions:  Precautions Precautions: Posterior Hip, Fall Precaution Booklet Issued: Yes (comment) Precaution Comments: trach, TBI, strict R posterior hip  precautions no adduction Required Braces or Orthoses: Knee Immobilizer - Right Knee Immobilizer - Right: Other (comment) Splint/Cast: R thumb spica, LUE has two splint options- Radial Nerve palsy with max wear time 2 hours and a wrist cock up splint. must sleep in the wrist cock up at night Restrictions Weight Bearing Restrictions: Yes RUE Weight Bearing: Weight bearing as tolerated LUE Weight Bearing: Weight bearing as tolerated RLE Weight Bearing: Non weight bearing LLE Weight Bearing: Weight bearing as tolerated Other Position/Activity Restrictions: Per [name] on 09/12/21 okay for weight L UE with transfers and on R UE with splint on L LE WBAT, NWB and hip precautions R LE - need to clarify. Pain:   No number given, pain in B LE's  Therapy/Group: Individual Therapy  Valma Cava 09/29/2021, 10:49 AM
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
## 2021-09-29 NOTE — Progress Notes (Signed)
Speech Language Pathology TBI Note  Patient Details  Name: Jeff [name] MRN: [id number] Date of Birth: February 06, 2000  Today's Date: 09/29/2021 SLP Individual Time: 1300-1345 SLP Individual Time Calculation (min): 45 min  Short Term Goals: Week 2: SLP Short Term Goal 1 (Week 2): Patient will sustain attention to functional tasks for 5 minutes with mod A verbal/visual cues for redirection. SLP Short Term Goal 2 (Week 2): Patient will utilize external aids to orient to time/place/situation with mod A multimodal cues SLP Short Term Goal 3 (Week 2): Patient will demonstrate basic problem solving skills with mod A multimodal cues SLP Short Term Goal 4 (Week 2): Pt will consume current diet with minimal s/sx of aspiration and with supervision verbal cues to implement slow rate of consumption and small bite sizes. SLP Short Term Goal 5 (Week 2): Patient will verbalize wants/needs at the phrase level with overall Mod A multimodal cues.  Skilled Therapeutic Interventions: Skilled treatment session focused on cognitive goals. SLP facilitated session by providing Min A question cues for basic problem solving during a 4 and 6 step picture sequencing task. Patient verbalized the actions within the pictures with supervision level verbal cues for maximizing length of utterance. Patient demonstrated selective attention to tasks in a mildly distracting environment for ~30 minutes with Min verbal cues for redirection. Patient also verbally responded to questions during an informal conversation with extra time.  Patient left upright in wheelchair with father present. Continue with current plan of care.      Pain No/Denies Pain   Agitated Behavior Scale: TBI Observation Details Observation Environment: CIR Start of observation period - Date: 09/29/21 Start of observation period - Time: 1300 End of observation period - Date: 09/29/21 End of observation period - Time: 1340 Agitated Behavior  Scale (DO NOT LEAVE BLANKS) Short attention span, easy distractibility, inability to concentrate: Present to a slight degree Impulsive, impatient, low tolerance for pain or frustration: Absent Uncooperative, resistant to care, demanding: Absent Violent and/or threatening violence toward people or property: Absent Explosive and/or unpredictable anger: Absent Rocking, rubbing, moaning, or other self-stimulating behavior: Absent Pulling at tubes, restraints, etc.: Absent Wandering from treatment areas: Absent Restlessness, pacing, excessive movement: Absent Repetitive behaviors, motor, and/or verbal: Absent Rapid, loud, or excessive talking: Absent Sudden changes of mood: Absent Easily initiated or excessive crying and/or laughter: Absent Self-abusiveness, physical and/or verbal: Absent Agitated behavior scale total score: 15  Therapy/Group: Individual Therapy  [name] 09/29/2021, 3:21 PM

## 2021-09-30 DIAGNOSIS — S069X3S Unspecified intracranial injury with loss of consciousness of 1 hour to 5 hours 59 minutes, sequela: Secondary | ICD-10-CM | POA: Diagnosis not present

## 2021-09-30 DIAGNOSIS — Z930 Tracheostomy status: Secondary | ICD-10-CM | POA: Diagnosis not present

## 2021-09-30 DIAGNOSIS — S81809A Unspecified open wound, unspecified lower leg, initial encounter: Secondary | ICD-10-CM | POA: Diagnosis not present

## 2021-09-30 DIAGNOSIS — G479 Sleep disorder, unspecified: Secondary | ICD-10-CM | POA: Diagnosis not present

## 2021-09-30 LAB — FUNGUS CULTURE WITH STAIN

## 2021-09-30 LAB — FUNGUS CULTURE RESULT

## 2021-09-30 NOTE — Progress Notes (Signed)
Occupational Therapy TBI Note  Patient Details  Name: Jeff [name] MRN: [id number] Date of Birth: [birth year]  Today's Date: 09/30/2021 OT Individual Time: 9678-9381 OT Individual Time Calculation (min): 54 min   Today's Date: 09/30/2021 OT Individual Time: 0175-1025 OT Individual Time Calculation (min): 39 min   Short Term Goals: Week 1:  OT Short Term Goal 1 (Week 1): Pt will compelte bed<>chair transfer wiht MOD A of 1 in prep for toielting OT Short Term Goal 1 - Progress (Week 1): Met OT Short Term Goal 2 (Week 1): Pt will don shirt wiht MOD A OT Short Term Goal 2 - Progress (Week 1): Met OT Short Term Goal 3 (Week 1): Pt will groom with supervision OT Short Term Goal 3 - Progress (Week 1): Met OT Short Term Goal 4 (Week 1): Pt will sit EOM with MIN A for 5 min during functional task OT Short Term Goal 4 - Progress (Week 1): Met Week 2:  OT Short Term Goal 1 (Week 2): pt will complete 3/4 steps of donning shirt with AE PRN OT Short Term Goal 2 (Week 2): Pt will thread BLE wiht no A for managing RLE and AE PRN OT Short Term Goal 3 (Week 2): Pt will complete 1/3 components of toileting OT Short Term Goal 4 (Week 2): Pt will transfer to [facility] with MIN A  Skilled Therapeutic Interventions/Progress Updates:     Pt received in bed with dad present and no pain initially but then reporting burning pain in R foot in stance with TDWB despite edu re NWB. Relieved with repositioning.  ADL: Pt completes LB dressing with reacher and supervision VC for not reaching too far forward d/t hip precautions. Pt requires A to pull pants past hips. Pt completes sit to stand with RW and MIN A from minimally elevated surface. Pt completes eating with education on propping R elbow to improve hand to mouth excursion/cutting techniques for pancakes.   Therapeutic activity Focus of rest of session on functionl mobility with w/c propulsion with S and cuing for management of weaker R arm and then  SPT with RW with 2 unsuccessful attempts. Pt able to power up, move walker but unable ot fully offweight foot to push through walker and pivot. 1 total A LOB second no LOB but reporting burning pain.   Pt left at end of session in w/c with exit alarm on, call light in reach and all needs met  Session 2:  Pt received in bed no pain reported throughout session. Pt father reporting he had been getting pt back to bed. OT educated that staff should be assisting but went ahead and educated on w/c parts management, precautions, and squat pivot transfers. Pt father was demoing prior method of having pt put arms around his shoulders. OT educates/demos key points of control at axillas and hips. Father then able to demo trasnfer EOB<>w/c with no cuing. OT signed father off on w/c<>bed transfers only.   Pt and OT go to tx gym to work on SPT with RW. Pt able to complete 2 this session with RW with pt doing better at maintaining NWB precautions but pt adducts and externally rotates RLE naturally in stance with LE getting tangled and crossed over LLE- OT adjusts. Standing balance with card sorting activity into suits for ~20 secx3 trials with decreased standing tolerance as time goes on.   Pt left at end of session in bed with exit alarm on, call light in reach and all needs  met   Therapy Documentation Precautions:  Precautions Precautions: Posterior Hip, Fall Precaution Booklet Issued: Yes (comment) Precaution Comments: trach, TBI, strict R posterior hip precautions no adduction Required Braces or Orthoses: Knee Immobilizer - Right Knee Immobilizer - Right: Other (comment) Splint/Cast: R thumb spica, LUE has two splint options- Radial Nerve palsy with max wear time 2 hours and a wrist cock up splint. must sleep in the wrist cock up at night Restrictions Weight Bearing Restrictions: Yes RUE Weight Bearing: Weight bearing as tolerated LUE Weight Bearing: Weight bearing as tolerated RLE Weight Bearing: Non  weight bearing LLE Weight Bearing: Weight bearing as tolerated Other Position/Activity Restrictions: Per [name] on 09/12/21 okay for weight L UE with transfers and on R UE with splint on L LE WBAT, NWB and hip precautions R LE - need to clarify.   Agitated Behavior Scale: TBI ABS discontinued d/t ABS score less than 20 for the last three days or no behaviors present       Therapy/Group: Individual Therapy  [name] 09/30/2021, 6:46 AM

## 2021-09-30 NOTE — Progress Notes (Signed)
Speech Language Pathology TBI Note  Patient Details  Name: Jeff [name] MRN: [id number] Date of Birth: 04-04-2000  Today's Date: 09/30/2021 SLP Individual Time: 1130-1200 SLP Individual Time Calculation (min): 30 min  Short Term Goals: Week 2: SLP Short Term Goal 1 (Week 2): Patient will sustain attention to functional tasks for 5 minutes with mod A verbal/visual cues for redirection. - Pt sustained attention to therapeutic task for ~15 minute with Min A verbal cues.  SLP Short Term Goal 2 (Week 2): Patient will utilize external aids to orient to time/place/situation with mod A multimodal cues.  - Min A for orientation to time (DOW, date, and month); Total A for situation, and Sup A for location  SLP Short Term Goal 3 (Week 2): Patient will demonstrate basic problem solving skills with mod A multimodal cues. - Pt demonstrated basic problem-solving skills with Min A verbal cues during card game (UNO).  SLP Short Term Goal 4 (Week 2): Pt will consume current diet with minimal s/sx of aspiration and with supervision verbal cues to implement slow rate of consumption and small bite sizes. - Did not formally address this session.  SLP Short Term Goal 5 (Week 2): Patient will verbalize wants/needs at the phrase level with overall Mod A multimodal cues. - Sup to Min A verbal prompts to initiate verbal expression of wants and needs.   Skilled Therapeutic Interventions: S: Pt seen this date for skilled ST intervention targeting cognitive-linguistic goals outlined above. Pt received awake/alert and performing oral hygiene at w/c level, with father providing Sup A . No c/o pain. Agreeable to ST intervention in speech office.  O: Please see above for objective data re: pt's performance on targeted goals. Overall, benefited from [place] A verbal cues for attention to task and recall of directions for card game. Continues to demonstrate disorientation to time; however, was able to utilize Min A  verbal cues to orient to DOW and date. Benefits from question prompts to initiate conversation and expression of wants and needs.  A: Pt appears sitmulable for skilled ST intervention; recommend continuation of current ST POC..  P: Pt returned to room and left in w/c with all safety measures activated. Call bell reviewed and within reach and all immediate needs met. Father present for supervision.  Pain Pain Assessment Pain Scale: 0-10 Pain Score: 0-No pain  Agitated Behavior Scale: TBI Observation Details Observation Environment: CIR Start of observation period - Date: 09/30/21 Start of observation period - Time: 1139 End of observation period - Date: 09/30/21 End of observation period - Time: 1200 Agitated Behavior Scale (DO NOT LEAVE BLANKS) Short attention span, easy distractibility, inability to concentrate: Present to a slight degree Impulsive, impatient, low tolerance for pain or frustration: Absent Uncooperative, resistant to care, demanding: Absent Violent and/or threatening violence toward people or property: Absent Explosive and/or unpredictable anger: Absent Rocking, rubbing, moaning, or other self-stimulating behavior: Absent Pulling at tubes, restraints, etc.: Absent Wandering from treatment areas: Absent Restlessness, pacing, excessive movement: Absent Repetitive behaviors, motor, and/or verbal: Absent Rapid, loud, or excessive talking: Absent Sudden changes of mood: Absent Easily initiated or excessive crying and/or laughter: Absent Self-abusiveness, physical and/or verbal: Absent Agitated behavior scale total score: 15  Therapy/Group: Individual Therapy  Jazmyne Beauchesne A Aydeen Blume 09/30/2021, 1:03 PM

## 2021-09-30 NOTE — Progress Notes (Signed)
Physical Therapy Session Note  Patient Details  Name: Jeff [name] MRN: [id number] Date of Birth: [birth year]  Today's Date: 09/30/2021 PT Individual Time: 1530-1630 PT Individual Time Calculation (min): 60 min   Short Term Goals: Week 2:  PT Short Term Goal 1 (Week 2): Patient will perform bed mobility with CGA consistently. PT Short Term Goal 2 (Week 2): Patient will perform basic transfer with CGA using LRAD. PT Short Term Goal 3 (Week 2): Patient will initiate w/c mobility.  Skilled Therapeutic Interventions/Progress Updates:     Pt received in R sideleaning and agrees to therapy. Reports pain all over but had just taken pain meds. PT provides rest breaks as needed to manage pain. R sidelying to sit with minA, then PT manages R lower extremity to gently lift of bed for comfort. Pt performs stand pivot to WC with modA and cues for hand placement, initiation, and sequencing. WC transport to gym for time management PT assist pt in donning L shoe. Pt performs sit to stand with RW and cues for hand placement, with minA to facilitate forward weight shift and power-up. Pt performs x10 heel raises with L lower extremity and cues for posture. Seated rest break. Pt performs sit to stand to RW and ambulates x7' with minA facilitation of pelvic stability, with cues for RW management and safe positioning to sit back in chair. Following seated rest breaks, pt ambulates 10' and 15' with same assistance and cueing. Seated rest break in between. Pt ambulates x15' to Nustep and has x1 LOB backward requiring modA to prevent fall. Pt completes Nustep for strengthening and increasing tolerable ROM. Pt completes with bilateral upper extremities as well as L lower extremity. R foot propped on airex mat for comfort. Pt completes x4:00 total with one extended rest break. Workload of 3 and average steps per minute 20. Stand pivot back WC with minA and facilitation of trunk stability. WC transport back to  room. Left seated in WC with alarm intact and all needs within reach.  Therapy Documentation Precautions:  Precautions Precautions: Posterior Hip, Fall Precaution Booklet Issued: Yes (comment) Precaution Comments: trach, TBI, strict R posterior hip precautions no adduction Required Braces or Orthoses: Knee Immobilizer - Right Knee Immobilizer - Right: Other (comment) Splint/Cast: R thumb spica, LUE has two splint options- Radial Nerve palsy with max wear time 2 hours and a wrist cock up splint. must sleep in the wrist cock up at night Restrictions Weight Bearing Restrictions: Yes RUE Weight Bearing: Weight bearing as tolerated LUE Weight Bearing: Weight bearing as tolerated RLE Weight Bearing: Non weight bearing LLE Weight Bearing: Weight bearing as tolerated Other Position/Activity Restrictions: Per [name] on 09/12/21 okay for weight L UE with transfers and on R UE with splint on L LE WBAT, NWB and hip precautions R LE - need to clarify.   Therapy/Group: Individual Therapy  Beau Fanny, PT, DPT 09/30/2021, 4:37 PM
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
## 2021-09-30 NOTE — Progress Notes (Signed)
Patient ID: Jeff [name], male   DOB: 2001-01-10, 21 y.o.   MRN: [id number]  SW met with pt and pt father in room to provide updates from team conference, and d/c date remains 9/23. Sw discussed scheduling family edu closer towards d/c date.   1539-SW spoke with [name] to discuss above. States she will speak with her parents about family edu as well. SW will follow-up next with updates.   Loralee Pacas, MSW, [place] Office: [phone] Cell: [phone] Fax: [phone]

## 2021-10-01 DIAGNOSIS — S81809A Unspecified open wound, unspecified lower leg, initial encounter: Secondary | ICD-10-CM | POA: Diagnosis not present

## 2021-10-01 DIAGNOSIS — G479 Sleep disorder, unspecified: Secondary | ICD-10-CM | POA: Diagnosis not present

## 2021-10-01 DIAGNOSIS — S069X3S Unspecified intracranial injury with loss of consciousness of 1 hour to 5 hours 59 minutes, sequela: Secondary | ICD-10-CM | POA: Diagnosis not present

## 2021-10-01 DIAGNOSIS — Z930 Tracheostomy status: Secondary | ICD-10-CM | POA: Diagnosis not present

## 2021-10-01 LAB — CBC
HCT: 35.5 % — ABNORMAL LOW (ref 39.0–52.0)
Hemoglobin: 11.4 g/dL — ABNORMAL LOW (ref 13.0–17.0)
MCH: 29.5 pg (ref 26.0–34.0)
MCHC: 32.1 g/dL (ref 30.0–36.0)
MCV: 91.7 fL (ref 80.0–100.0)
Platelets: 332 10*3/uL (ref 150–400)
RBC: 3.87 MIL/uL — ABNORMAL LOW (ref 4.22–5.81)
RDW: 13.7 % (ref 11.5–15.5)
WBC: 6.3 10*3/uL (ref 4.0–10.5)
nRBC: 0 % (ref 0.0–0.2)

## 2021-10-01 LAB — CREATININE, SERUM
Creatinine, Ser: 0.55 mg/dL — ABNORMAL LOW (ref 0.61–1.24)
GFR, Estimated: >60 mL/min (ref >60)

## 2021-10-01 LAB — HEPARIN ANTI-XA: Heparin LMW: 1.15 IU/mL

## 2021-10-01 MED ORDER — ENOXAPARIN SODIUM 80 MG/0.8ML IJ SOSY
80.0000 mg | PREFILLED_SYRINGE | Freq: Two times a day (BID) | INTRAMUSCULAR | Status: DC
  Administered 2021-10-01 – 2021-10-08 (×14): 80 mg via SUBCUTANEOUS
  Filled 2021-10-01 (×14): qty 0.8

## 2021-10-01 MED ORDER — GABAPENTIN 600 MG PO TABS
600.0000 mg | ORAL_TABLET | Freq: Three times a day (TID) | ORAL | Status: DC
  Administered 2021-10-01 – 2021-10-11 (×31): 600 mg via ORAL
  Filled 2021-10-01 (×31): qty 1

## 2021-10-01 NOTE — Progress Notes (Signed)
Occupational Therapy TBI Note  Patient Details  Name: Jeff Jeff MRN: [id number] Date of Birth: 07-05-2000  Today's Date: 10/01/2021 OT Individual Time: 7026-3785 OT Individual Time Calculation (min): 75 min   Session 2: 1300-1329  29 min   Short Term Goals: Week 1:  OT Short Term Goal 1 (Week 1): Pt will compelte bed<>chair transfer wiht MOD A of 1 in prep for toielting OT Short Term Goal 1 - Progress (Week 1): Met OT Short Term Goal 2 (Week 1): Pt will don shirt wiht MOD A OT Short Term Goal 2 - Progress (Week 1): Met OT Short Term Goal 3 (Week 1): Pt will groom with supervision OT Short Term Goal 3 - Progress (Week 1): Met OT Short Term Goal 4 (Week 1): Pt will sit EOM with MIN A for 5 min during functional task OT Short Term Goal 4 - Progress (Week 1): Met  Skilled Therapeutic Interventions/Progress Updates:    Pt received in bed with 6 out of 10 pain in R hip/foot. LON provided meds provided for pain relief  ADL: Pt completes ADL at overall MIN-MOD A. Pt reporting need to use bathroom. Pt completes SPT with RW EOB>w/c<>BSC with overall MIN A up to MOD A `x for LOB d/t poor trunk control. Pt able to manage brief with VC for knee extension on L and RLE weight bearing precautions. Pt mostly keeping TTWB with 1UE support on RW and NWB with BUE on RW. Pt able to cleanse buttocks post BM and requires MOD A to thread RLE into pants this date with reacher. Pt able ot advance pants past hips this date in standing with MIN A for balance and 1 seated rest break! Demo of reacher and sock aide. Pt abl eot use with LLE.   Pt left at end of session in bed with exit alarm on, call light in reach and all needs met  Session 2: Pt received in bed needing MAX A for bed mobility for LE and trunk management. Pt completes lateral scoot transfer with CGA overall to w/c. Tranported to outside courtyard for time management. Pt and OT discuss hobbies and passtimes for occupational profile  and rapport. Pt completes w/c mobility back to room unable ot recall room number, but able to manage turning in an elevator and propelling community level distances with 1 rest break. Exited session with pt seated in bed, exit alarm on and call light in reach    Therapy Documentation Precautions:  Precautions Precautions: Posterior Hip, Fall Precaution Booklet Issued: Yes (comment) Precaution Comments: R posterior hip precautions Required Braces or Orthoses: Splint/Cast Knee Immobilizer - Right: Other (comment) Splint/Cast: R thumb spica, LUE has two splint options- Radial Nerve palsy with max wear time 2 hours and a wrist cock up splint. must sleep in the wrist cock up at night Restrictions Weight Bearing Restrictions: Yes RUE Weight Bearing: Weight bearing as tolerated LUE Weight Bearing: Weight bearing as tolerated RLE Weight Bearing: Non weight bearing LLE Weight Bearing: Weight bearing as tolerated Other Position/Activity Restrictions: Per [name] on 09/12/21 okay for weight L UE with transfers and on R UE with splint on L LE WBAT, NWB and hip precautions R LE  Agitated Behavior Scale: TBI ABS discontinued d/t ABS score less than 20 for the last three days or no behaviors present       Therapy/Group: Individual Therapy  [name] 10/01/2021, 6:45 AM
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
## 2021-10-01 NOTE — Progress Notes (Signed)
Medications reviewed with pt's Dad.  He is concerned that the Trazodone may be making pt restless or disoriented.  We discussed possible causes of disorientation at night (ex. Medications, hosiptal induced confusion and also his brain injury).  Last dose of Tramadol 9/5 at 1300 and last dose of Oxycodone 9/1 at 2213.  We also discussed Gabapentin and Melatonin.  PRN Tylenol given at [facility] request to see if that will help pt be more comfortable and be able to sleep. Hilton Sinclair BSN RN CMSRN 10/01/2021, 11:41 PM

## 2021-10-01 NOTE — Progress Notes (Signed)
Speech Language Pathology TBI Note  Patient Details  Name: Jeff [name] MRN: [id number] Date of Birth: 08/21/2000  Today's Date: 10/01/2021 SLP Individual Time: 1330-1410 SLP Individual Time Calculation (min): 40 min  Short Term Goals: Week 2: SLP Short Term Goal 1 (Week 2): Patient will sustain attention to functional tasks for 5 minutes with mod A verbal/visual cues for redirection. SLP Short Term Goal 2 (Week 2): Patient will utilize external aids to orient to time/place/situation with mod A multimodal cues SLP Short Term Goal 3 (Week 2): Patient will demonstrate basic problem solving skills with mod A multimodal cues SLP Short Term Goal 4 (Week 2): Pt will consume current diet with minimal s/sx of aspiration and with supervision verbal cues to implement slow rate of consumption and small bite sizes. SLP Short Term Goal 5 (Week 2): Patient will verbalize wants/needs at the phrase level with overall Mod A multimodal cues.  Skilled Therapeutic Interventions: Skilled treatment session focused on cognitive goals. SLP facilitated session by providing overall Mod A verbal cues for problem solving during a mildly complex medication management task in which he had to identify medication administration errors. Patient's attention was mildly impacted by discomfort in the chair this session despite multiple attempts to reposition. Patient continues to demonstrate decreased recall, problem solving and awareness as he reported he was running today in PT session despite weightbearing precautions. SLP provided education. Patient left upright in wheelchair with father present. Continue with current plan of care.      Pain Pain Assessment Pain Scale: 0-10 Pain Score: 3   Agitated Behavior Scale: TBI  ABS discontinued d/t ABS score less than 20 for the last three days or no behaviors present   Therapy/Group: Individual Therapy  Rhegan Trunnell 10/01/2021, 3:20 PM
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
## 2021-10-01 NOTE — Progress Notes (Addendum)
Pharmacy Consult - Lovenox full dose for acute RLE DVT and age indeterminate RUE DVT.   Currently on  Lovenox 100mg  Q12 hr.   Goal 4 hour anti-Xa level = 0.6-1 units/ml)   antiXa Levels had been therapeutic , thus will not decrease Enox dose despite weight only in 60s kg range.  Today  9/6  the 4 hour antiXa level increased to 1.15.  I will decrease lovenox by 20%. Updated weight = 64.7 kg   CBC stable Hgb 11.4 and plts 332K today. (9/6). No current bleeding reported,  hx recent SDH (07/2021).  Scr <1 stable.   Plan: Decrease Lovenox to 80 mg sq Q 12 hours Follow CBC, renal function.  Will check antiXa level around in ~2 days. Follow up plan for long term anticoagulation.  Thank you for allowing pharmacy to be part of this patients care team.  10-24-1982, RPh Clinical Pharmacist [phone] 10/01/2021 2:21 PM  Please check AMION for all [facility] phone numbers After 10:00 PM, call Main Pharmacy [phone]

## 2021-10-02 DIAGNOSIS — S81809A Unspecified open wound, unspecified lower leg, initial encounter: Secondary | ICD-10-CM | POA: Diagnosis not present

## 2021-10-02 DIAGNOSIS — Z930 Tracheostomy status: Secondary | ICD-10-CM | POA: Diagnosis not present

## 2021-10-02 DIAGNOSIS — S069X3S Unspecified intracranial injury with loss of consciousness of 1 hour to 5 hours 59 minutes, sequela: Secondary | ICD-10-CM | POA: Diagnosis not present

## 2021-10-02 DIAGNOSIS — G479 Sleep disorder, unspecified: Secondary | ICD-10-CM | POA: Diagnosis not present

## 2021-10-02 MED ORDER — MELATONIN 5 MG PO TABS
5.0000 mg | ORAL_TABLET | Freq: Every evening | ORAL | Status: DC | PRN
Start: 2021-10-02 — End: 2021-10-11
  Administered 2021-10-03 – 2021-10-10 (×5): 5 mg via ORAL
  Filled 2021-10-02 (×5): qty 1

## 2021-10-02 MED ORDER — TRAZODONE HCL 50 MG PO TABS
100.0000 mg | ORAL_TABLET | Freq: Every evening | ORAL | Status: DC | PRN
Start: 2021-10-02 — End: 2021-10-11

## 2021-10-02 NOTE — Progress Notes (Signed)
Physical Therapy Session Note  Patient Details  Name: Jeff [name] MRN: [id number] Date of Birth: 10/11/2000  Today's Date: 10/02/2021 PT Individual Time: 2409-7353 PT Individual Time Calculation (min): 45 min   Short Term Goals: Week 2:  PT Short Term Goal 1 (Week 2): Patient will perform bed mobility with CGA consistently. PT Short Term Goal 2 (Week 2): Patient will perform basic transfer with CGA using LRAD. PT Short Term Goal 3 (Week 2): Patient will initiate w/c mobility.  Skilled Therapeutic Interventions/Progress Updates:     Patient in bed with his father completing lower body dressing upon PT arrival. Patient alert and agreeable to PT session. Patient reported 6/10 R lateral thigh and hip pain during session, RN made aware. PT provided repositioning, rest breaks, and distraction as pain interventions throughout session.   Patient oriented x4 with x1 cue for self correction of situation today.  Focused session on task specific training for R upper extremity, problem solving, and retention of instructions for a novel task, Wii Bowling. Patient plays video games recreationally at baseline, but unfamiliar with this system. Able to follow instructions for use of controller and adjusting positioning to improve score. Patient independently problem solved compensatory strategy to accommodate L bias in his swing by moving his player R. Played 10/10 frames with cues for increased elbow extension and shoulder ROM in arm swing.  Therapeutic Activity: Bed Mobility: Patient performed sit to supine with min A for R lower extremity management in a flat bed with min use of bed rails. Provided verbal cues for  use of bottom elbow for trunk control. Transfers: Patient performed squat pivot bed>w/c with supervision and min cues for hand placement. He performed blocked practice sit to/from stand x5 with CGA using a RW from w/c. Provided verbal cues for placing R foot ahead to reduce  flexion angle on R hip, maintaining R lower extremity NWB, and pushing up and reaching back to sit.   Wheelchair Mobility:  Patient propelled wheelchair >100 feet x2 with supervision using B upper extremities. Provided mod verbal cues for path finding and turning technique. Patient demonstrated good safety awareness with stopping for other people and navigating around objects throughout without cues. Spent increased time on w/c parts management. Patient able to doff/donn leg rests with set-up assist, use breaks, and manage arm rest with mod cues following PT demonstration. Problem solved how to manage parts with limited fine motor control in his hands without cues to complete tasks.   Patient in w/c with his father in the room at end of session with breaks locked and all needs within reach. Patient's father is cleared for transfers with the patient in the room, left bed alarm off at this time.   Therapy Documentation Precautions:  Precautions Precautions: Posterior Hip, Fall Precaution Booklet Issued: Yes (comment) Precaution Comments: R posterior hip precautions Required Braces or Orthoses: Splint/Cast Knee Immobilizer - Right: Other (comment) Splint/Cast: R thumb spica, LUE has two splint options- Radial Nerve palsy with max wear time 2 hours and a wrist cock up splint. must sleep in the wrist cock up at night Restrictions Weight Bearing Restrictions: Yes RUE Weight Bearing: Weight bearing as tolerated LUE Weight Bearing: Weight bearing as tolerated RLE Weight Bearing: Non weight bearing LLE Weight Bearing: Weight bearing as tolerated Other Position/Activity Restrictions: Per Jeff [name] on 09/12/21 okay for weight L UE with transfers and on R UE with splint on L LE WBAT, NWB and hip precautions R LE  Therapy/Group: Individual Therapy  Jeff [name] L Kelen Laura PT, DPT, NCS, CBIS  10/02/2021, 5:38 PM

## 2021-10-02 NOTE — Progress Notes (Signed)
Speech Language Pathology TBI Note  Patient Details  Name: Jeff [name] MRN: [id number] Date of Birth: Mar 29, 2000  Today's Date: 10/02/2021 SLP Individual Time: 2725-3664 SLP Individual Time Calculation (min): 30 min  Short Term Goals: Week 2: SLP Short Term Goal 1 (Week 2): Patient will sustain attention to functional tasks for 5 minutes with mod A verbal/visual cues for redirection. SLP Short Term Goal 2 (Week 2): Patient will utilize external aids to orient to time/place/situation with mod A multimodal cues SLP Short Term Goal 3 (Week 2): Patient will demonstrate basic problem solving skills with mod A multimodal cues SLP Short Term Goal 4 (Week 2): Pt will consume current diet with minimal s/sx of aspiration and with supervision verbal cues to implement slow rate of consumption and small bite sizes. SLP Short Term Goal 5 (Week 2): Patient will verbalize wants/needs at the phrase level with overall Mod A multimodal cues.  Skilled Therapeutic Interventions: Skilled ST treatment focused on cognitive goals. Pt was received upright in wheelchair on arrival and accompanied by his father. SLP facilitated session by providing overall mod A verbal/visual cues for working memory and sustained attention task using BITS to recall list of words in sequential order and increasing in complexity. Pt completed task with 60% accuracy to recall up to 5 words with up to 60% accuracy. Pt continued to make the sam error repetitively with minimal awareness/self correction. Pt also completed trail making task using BITS by alternating attention between number and letter up 10 12 times with errors x3. Pt problem solved in order to self correct sequency with min A verbal cues. Pt sustained attention to tasks within quiet, low stim environment during 10 minute intervals with sup A verbal redirection cues. Patient was left in wheelchair with alarm activated and immediate needs within reach at end of  session. Father and nurse at bedside. Continue per current plan of care.      Pain Pain Assessment Pain Scale: 0-10 Pain Score: 4    Therapy/Group: Individual Therapy  Tamala Ser 10/02/2021, 3:22 PM

## 2021-10-02 NOTE — Progress Notes (Signed)
Occupational Therapy TBI Note  Patient Details  Name: Jeff [name] MRN: [id number] Date of Birth: Mar 25, 2000  Today's Date: 10/02/2021 OT Individual Time: 1330-1415 OT Individual Time Calculation (min): 45 min   Short Term Goals: Week 3:  OT Short Term Goal 1 (Week 3): Pt will complete 2/3 components of toileting wiht MIN A and no overt LOB OT Short Term Goal 2 (Week 3): Pt will maintain precautions with RLE throughout transitional movements and no more than min cuing OT Short Term Goal 3 (Week 3): Pt will thread BLE into pants wiht no A and AE PRN OT Short Term Goal 4 (Week 3): Pt will recall use of AE for footwear with no more than min cuing  Skilled Therapeutic Interventions/Progress Updates:    Pt greeted seated in wc and ready for therapy. OT placed foot up brace in shoe and assisted with donning B shoes with R foot-up brace. Pt propelled wc to therapy gym with increased time and supervision. Worked on sit<>stands using RW. CGA and min cues to maintain NWB R LE. Pt reported increased pain in R LE and fatigue in R thigh holding foot off of the floor. Addressed balance in standing reaching R UE  a short distance forward to simulate standing BADLs. Utilized SAEBO arm support for cup stacking activity with pt able to achieve more shoulder forward flexion of R UE in SAEBO support. Applied 1:1 NMES to CH1 supraspinatus and middle deltoid.   Ratio 1:1 Rate 35 pps Waveform- Asymmetric Ramp 1.0 Pulse 300 CH1 Intensity- 15  Duration -  40  Pt returned to room and left seated in wc with father present and needs met.   Therapy Documentation Precautions:  Precautions Precautions: Posterior Hip, Fall Precaution Booklet Issued: Yes (comment) Precaution Comments: R posterior hip precautions Required Braces or Orthoses: Splint/Cast Knee Immobilizer - Right: Other (comment) Splint/Cast: R thumb spica, LUE has two splint options- Radial Nerve palsy with max wear time 2 hours  and a wrist cock up splint. must sleep in the wrist cock up at night Restrictions Weight Bearing Restrictions: Yes RUE Weight Bearing: Weight bearing as tolerated LUE Weight Bearing: Weight bearing as tolerated RLE Weight Bearing: Non weight bearing LLE Weight Bearing: Weight bearing as tolerated Other Position/Activity Restrictions: Per [name] on 09/12/21 okay for weight L UE with transfers and on R UE with splint on L LE WBAT, NWB and hip precautions R LE Pain: Pain Assessment Pain Scale: 0-10 Pain Score: 4  R Leg, rest and repositioned  Therapy/Group: Individual Therapy  Valma Cava 10/02/2021, 2:21 PM
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
## 2021-10-02 NOTE — Progress Notes (Signed)
Occupational Therapy Weekly Progress Note  Patient Details  Name: Jeff [name] MRN: [id number] Date of Birth: 10/20/2000  Beginning of progress report period: September 26, 2021 End of progress report period: October 02, 2021  Today's Date: 10/02/2021 OT Individual Time: 0929-5747 OT Individual Time Calculation (min): 73 min    Patient has met 3 of 4 short term goals.  Pt is making excellent progress this reporting period initiating use of RW for transfers to/from toilet with MOD cuing for precaution adherence. Pt MAX A for toileting, but was able to cleanse buttocks after BM in SLE stance with seated rest break. Pt remains deconditioned requiring increased rests in 1LE stance d/t deconditioning, impaired awareness/memory and decreased balance impacting the safety and efficiency with BADLs and transitional movments.  Patient continues to demonstrate the following deficits: muscle weakness, decreased cardiorespiratoy endurance, unbalanced muscle activation and decreased coordination, decreased awareness, decreased problem solving, decreased safety awareness, decreased memory, and demonstrates behaviors consistent with [place] Level 7, and decreased sitting balance, decreased standing balance, decreased postural control, decreased balance strategies, and difficulty maintaining precautions and therefore will continue to benefit from skilled OT intervention to enhance overall performance with BADL, iADL, and Reduce care partner burden.  Patient progressing toward long term goals..  Continue plan of care.  OT Short Term Goals Week 2:  OT Short Term Goal 1 (Week 2): pt will complete 3/4 steps of donning shirt with AE PRN OT Short Term Goal 1 - Progress (Week 2): Met OT Short Term Goal 2 (Week 2): Pt will thread BLE wiht no A for managing RLE and AE PRN OT Short Term Goal 2 - Progress (Week 2): Progressing toward goal OT Short Term Goal 3 (Week 2): Pt will complete 1/3 components of  toileting OT Short Term Goal 3 - Progress (Week 2): Met OT Short Term Goal 4 (Week 2): Pt will transfer to [facility] with MIN A OT Short Term Goal 4 - Progress (Week 2): Met Week 3:  OT Short Term Goal 1 (Week 3): Pt will complete 2/3 components of toileting wiht MIN A and no overt LOB OT Short Term Goal 2 (Week 3): Pt will maintain precautions with RLE throughout transitional movements and no more than min cuing OT Short Term Goal 3 (Week 3): Pt will thread BLE into pants wiht no A and AE PRN OT Short Term Goal 4 (Week 3): Pt will recall use of AE for footwear with no more than min cuing  Skilled Therapeutic Interventions/Progress Updates:     Pt received in bed with unrated R hip pain with bed mobility, repositioned for pain relief  ADL: Pt requesting to shower and MD cleared if occlusives could be secured on B thighs. Spent copious time ensuring no water would be able to get into bags with tape and seal skin around plastic covering thighs. Pt completes MIN A transfers to/from bed and shower without AD and sit to stand during dressing for clothing management with RW. Pt requires MOD A to bathe and would benefit from LHSS to wash B feet and lower legs. PT able ot thread BLE into pants with no physical A but cuing for problems solving with reacher. Pt competes donning shirt with MIN A using reacher to thread head with great success.   Pt left at end of session in bed with exit alarm on, call light in reach and all needs met   ABS discontinued d/t ABS score less than 20 for the last three days or no  behaviors present   Therapy Documentation Precautions:  Precautions Precautions: Posterior Hip, Fall Precaution Booklet Issued: Yes (comment) Precaution Comments: R posterior hip precautions Required Braces or Orthoses: Splint/Cast Knee Immobilizer - Right: Other (comment) Splint/Cast: R thumb spica, LUE has two splint options- Radial Nerve palsy with max wear time 2 hours and a wrist cock up  splint. must sleep in the wrist cock up at night Restrictions Weight Bearing Restrictions: Yes RUE Weight Bearing: Weight bearing as tolerated LUE Weight Bearing: Weight bearing as tolerated RLE Weight Bearing: Non weight bearing LLE Weight Bearing: Weight bearing as tolerated Other Position/Activity Restrictions: Per [name] on 09/12/21 okay for weight L UE with transfers and on R UE with splint on L LE WBAT, NWB and hip precautions R LE Therapy/Group: Individual Therapy  [name] 10/02/2021, 6:47 AM

## 2021-10-03 DIAGNOSIS — Z945 Skin transplant status: Secondary | ICD-10-CM

## 2021-10-03 DIAGNOSIS — S71101A Unspecified open wound, right thigh, initial encounter: Secondary | ICD-10-CM

## 2021-10-03 NOTE — Progress Notes (Addendum)
Speech Language Pathology TBI Note  Patient Details  Name: Jeff [name] MRN: [id number] Date of Birth: 03/02/2000  Today's Date: 10/03/2021 SLP Individual Time: 0900-1000 SLP Individual Time Calculation (min): 60 min  Short Term Goals: Week 2: SLP Short Term Goal 1 (Week 2): Patient will sustain attention to functional tasks for 5 minutes with mod A verbal/visual cues for redirection. SLP Short Term Goal 2 (Week 2): Patient will utilize external aids to orient to time/place/situation with mod A multimodal cues SLP Short Term Goal 3 (Week 2): Patient will demonstrate basic problem solving skills with mod A multimodal cues SLP Short Term Goal 4 (Week 2): Pt will consume current diet with minimal s/sx of aspiration and with supervision verbal cues to implement slow rate of consumption and small bite sizes. SLP Short Term Goal 5 (Week 2): Patient will verbalize wants/needs at the phrase level with overall Mod A multimodal cues.  Skilled Therapeutic Interventions:Skilled ST services focused on cognitive skills. SLP facilitated basic, mildly complex and complex problem solving, error awareness and sustained attention in PEG design tasks. Pt demonstrated sustained attention in overall 40 minute interval, requiring supervision A redirection cues to task in 20 minute intervals. Pt completed basic and mildly complex designs mod I. Pt required only supervision A verbal cues for error recognition with ability to correct errors mod I in complex design (coordinating colors to numbers verse creating from a photograph.) SLP initiated memory notebook, pt required supervision A verbal cues to recall am OT session. Pt was left in room with father, call bell within reach and bed alarm set. SLP recommends to continue skilled services.     Pain Pain Assessment Pain Score: 0-No pain  Agitated Behavior Scale: TBI Observation Details Observation Environment: pt in room Start of observation period  - Date: 10/03/21 Start of observation period - Time: 0900 End of observation period - Date: 10/03/21 End of observation period - Time: 0100 Agitated Behavior Scale (DO NOT LEAVE BLANKS) Short attention span, easy distractibility, inability to concentrate: Absent Impulsive, impatient, low tolerance for pain or frustration: Absent Uncooperative, resistant to care, demanding: Absent Violent and/or threatening violence toward people or property: Absent Explosive and/or unpredictable anger: Absent Rocking, rubbing, moaning, or other self-stimulating behavior: Absent Pulling at tubes, restraints, etc.: Absent Wandering from treatment areas: Absent Restlessness, pacing, excessive movement: Absent Repetitive behaviors, motor, and/or verbal: Absent Rapid, loud, or excessive talking: Absent Sudden changes of mood: Absent Easily initiated or excessive crying and/or laughter: Absent Self-abusiveness, physical and/or verbal: Absent Agitated behavior scale total score: 14  Therapy/Group: Individual Therapy  Jaxsin Bottomley  [facility] 10/03/2021, 1:15 PM

## 2021-10-03 NOTE — Progress Notes (Signed)
Physical Therapy Session Note  Patient Details  Name: Jeff [name] MRN: [id number] Date of Birth: 2000-04-23  Today's Date: 10/03/2021 PT Individual Time: 1002-1059 PT Individual Time Calculation (min): 57 min   Short Term Goals: Week 2:  PT Short Term Goal 1 (Week 2): Patient will perform bed mobility with CGA consistently. PT Short Term Goal 2 (Week 2): Patient will perform basic transfer with CGA using LRAD. PT Short Term Goal 3 (Week 2): Patient will initiate w/c mobility.  Skilled Therapeutic Interventions/Progress Updates:     Pt received in R sidelying in bed and agrees to therapy. Reports pain is "not bad", all over. PT provides rest breaks as needed to manage pain. Pt transitions to sitting with verbal cues for body mechanics and sequencing. PT provides totalA to don shoes bilaterally. Pt performs sit to stand to RW with CGA and cues for hand placement and sequencing. Pt performs stand step transfer to [facility] with minA with cues for positioning and hand placement for safety. WC transport to gym for time management. Pt performs multiple bouts of ambulation with extended seated rest breaks in between secondary to fatigue. Pt ambulates with RW and CGA, with cues for posture and body mechanics, using plantarflexors in L lower extremity to provide power for swing phase, rather than relying extensively on upper extremities for support. Pt ambulates bouts of 25', 39', and 10', with extended seated rest breaks between each bout. Pt visibly fatigued on final bout and requires +2 to bring [facility] for safety. Pt performs stand pivot back to bed with minA. Sit to supine with modA management of lower extremities. Left supine with alarm intact and all needs within reach.  Therapy Documentation Precautions:  Precautions Precautions: Posterior Hip, Fall Precaution Booklet Issued: Yes (comment) Precaution Comments: R posterior hip precautions Required Braces or Orthoses: Splint/Cast Knee  Immobilizer - Right: Other (comment) Splint/Cast: R thumb spica, LUE has two splint options- Radial Nerve palsy with max wear time 2 hours and a wrist cock up splint. must sleep in the wrist cock up at night Restrictions Weight Bearing Restrictions: Yes RUE Weight Bearing: Weight bearing as tolerated LUE Weight Bearing: Weight bearing as tolerated RLE Weight Bearing: Non weight bearing LLE Weight Bearing: Weight bearing as tolerated Other Position/Activity Restrictions: Per [name] on 09/12/21 okay for weight L UE with transfers and on R UE with splint on L LE WBAT, NWB and hip precautions R LE   Therapy/Group: Individual Therapy  Beau Fanny, PT, DPT 10/03/2021, 10:48 AM
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
## 2021-10-03 NOTE — Progress Notes (Signed)
Occupational Therapy TBI Note  Patient Details  Name: Jeff [name] MRN: [id number] Date of Birth: [birth year]  Today's Date: 10/03/2021 OT Individual Time: 5916-3846 OT Individual Time Calculation (min): 71 min    Short Term Goals: Week 2:  OT Short Term Goal 1 (Week 2): pt will complete 3/4 steps of donning shirt with AE PRN OT Short Term Goal 1 - Progress (Week 2): Met OT Short Term Goal 2 (Week 2): Pt will thread BLE wiht no A for managing RLE and AE PRN OT Short Term Goal 2 - Progress (Week 2): Progressing toward goal OT Short Term Goal 3 (Week 2): Pt will complete 1/3 components of toileting OT Short Term Goal 3 - Progress (Week 2): Met OT Short Term Goal 4 (Week 2): Pt will transfer to [facility] with MIN A OT Short Term Goal 4 - Progress (Week 2): Met Week 3:  OT Short Term Goal 1 (Week 3): Pt will complete 2/3 components of toileting wiht MIN A and no overt LOB OT Short Term Goal 2 (Week 3): Pt will maintain precautions with RLE throughout transitional movements and no more than min cuing OT Short Term Goal 3 (Week 3): Pt will thread BLE into pants wiht no A and AE PRN OT Short Term Goal 4 (Week 3): Pt will recall use of AE for footwear with no more than min cuing  Skilled Therapeutic Interventions/Progress Updates:     Pt received in  bed with unrated "ok" pain in hip. Rest, respositioning and request of meds from nurse provided  ADL: Pt sits EOB with foot propped on pillow for comfortable elevation of RLE. Pt eats breakfast with cuing to problem solve opening packages and containers with cuing to don radial splint to improve grasp and success. Pt completes self feeding with set up and then progresses to dressing. Pt does not recall shower or donning new shirt but does demo working memory as pt recalls use of AE and steps. Pt able to thread BLE into pants and use sock aide to don B socks with MIN A for knee extension of RLE. Pt with RW completes STS with MIN A and  requires cuing for hand placement. Total A to don shoes. Pt would benefit from trialling shoe funnel. MIN A SPT with RW and shoes. Pt able ot propel w/c up to sink for oral care with 2 cup method to decrease risk of breaking hip precautions.   Therapeutic activity Focus of remainder of session on functional reach in hand manipulation and pinch using progressive resistive clothes pins with BUE. RUE in [place]oM[place] and LUE with radial nerve palsy splint. Pt able ot reach in all planes with BUE 0-100 degrees of flexion and with increased time able to rotate, spin and pinch clothespins. Pt asks this OT 2x within 20  min "when will I see you again" demoing poor short term recall.   Pt left at end of session in w/c with father in room, call light in reach and all needs met   Therapy Documentation Precautions:  Precautions Precautions: Posterior Hip, Fall Precaution Booklet Issued: Yes (comment) Precaution Comments: R posterior hip precautions Required Braces or Orthoses: Splint/Cast Knee Immobilizer - Right: Other (comment) Splint/Cast: R thumb spica, LUE has two splint options- Radial Nerve palsy with max wear time 2 hours and a wrist cock up splint. must sleep in the wrist cock up at night Restrictions Weight Bearing Restrictions: Yes RUE Weight Bearing: Weight bearing as tolerated LUE Weight Bearing: Weight bearing  as tolerated RLE Weight Bearing: Non weight bearing LLE Weight Bearing: Weight bearing as tolerated Other Position/Activity Restrictions: Per Jeff [name] on 09/12/21 okay for weight L UE with transfers and on R UE with splint on L LE WBAT, NWB and hip precautions R LE General:   Agitated Behavior Scale: TBI ABS discontinued d/t ABS score less than 20 for the last three days or no behaviors present        Therapy/Group: Individual Therapy  [name] 10/03/2021, 6:51 AM

## 2021-10-03 NOTE — Progress Notes (Signed)
Orthopaedic Trauma Service  Dressing change to R thigh  Overall looks healthy but there is hypertrophic tissue coming through skin graft  No evidence of infection  Discussed with plastics  New layer of myriad biologic 2 layer sheet placed directly on hypertrophic tissue followed by sorbact layer tacked down with steri strips followed by KY jelly, 4x4s, abds and ACE  Will change on Monday and reassess  No dressing changes to be done until Monday   Jari Pigg, PA-C [phone] (C) 10/03/2021, 1:44 PM  [facility] [place] [address] [phone] [name] (F)       Patient ID: Jeff [name], male   DOB: July 27, 2000, 21 y.o.   MRN: [id number]

## 2021-10-04 DIAGNOSIS — S069X3S Unspecified intracranial injury with loss of consciousness of 1 hour to 5 hours 59 minutes, sequela: Secondary | ICD-10-CM

## 2021-10-04 DIAGNOSIS — S069X3D Unspecified intracranial injury with loss of consciousness of 1 hour to 5 hours 59 minutes, subsequent encounter: Secondary | ICD-10-CM

## 2021-10-04 LAB — CBC
HCT: 41.2 % (ref 39.0–52.0)
Hemoglobin: 13.4 g/dL (ref 13.0–17.0)
MCH: 29.6 pg (ref 26.0–34.0)
MCHC: 32.5 g/dL (ref 30.0–36.0)
MCV: 91.2 fL (ref 80.0–100.0)
Platelets: 390 10*3/uL (ref 150–400)
RBC: 4.52 MIL/uL (ref 4.22–5.81)
RDW: 13.7 % (ref 11.5–15.5)
WBC: 6.6 10*3/uL (ref 4.0–10.5)
nRBC: 0 % (ref 0.0–0.2)

## 2021-10-04 LAB — HEPARIN ANTI-XA: Heparin LMW: 0.74 IU/mL
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
## 2021-10-04 NOTE — Progress Notes (Signed)
Pt slept good through the night.patient asleep from 1 am and still remains sleep.

## 2021-10-04 NOTE — Progress Notes (Signed)
Pharmacy Consult - Lovenox full dose for acute RLE DVT and age indeterminate RUE DVT.     Currently on Lovenox 80mg  Q12 hr - updated weight 64.7 kg Goal 4 hour anti-Xa level = 0.6-1 units/ml   9/6: 4 hour antiXa level increased to 1.15.  I will decrease lovenox by 20%. 9/9: 4-hour antiXa level is 0.74  CBC stable - Hgb 13.4 and plts 390. No signs or symptoms of bleeding noted, hx recent SDH (07/2021).  Scr is stable <1.   Plan: Continue Lovenox 80 mg sq Q 12 hours Follow CBC, renal function.   Follow up plan for long term anticoagulation.  Thank you for allowing pharmacy to be part of this patients care team.  [birth year]-24-1981, PharmD, [facility] PGY1 Pharmacy Resident 10/04/2021 1:27 PM

## 2021-10-04 NOTE — Progress Notes (Signed)
Speech Language Pathology TBI Note  Patient Details  Name: Jeff [name] MRN: [id number] Date of Birth: 12-20-2000  Today's Date: 10/04/2021 SLP Individual Time: 1209-1236 SLP Individual Time Calculation (min): 27 min  Short Term Goals: Week 2: SLP Short Term Goal 1 (Week 2): Patient will sustain attention to functional tasks for 5 minutes with mod A verbal/visual cues for redirection. SLP Short Term Goal 2 (Week 2): Patient will utilize external aids to orient to time/place/situation with mod A multimodal cues SLP Short Term Goal 3 (Week 2): Patient will demonstrate basic problem solving skills with mod A multimodal cues SLP Short Term Goal 4 (Week 2): Pt will consume current diet with minimal s/sx of aspiration and with supervision verbal cues to implement slow rate of consumption and small bite sizes. SLP Short Term Goal 5 (Week 2): Patient will verbalize wants/needs at the phrase level with overall Mod A multimodal cues.  Skilled Therapeutic Interventions: Skilled treatment session focused on cognitive and dysphagia goals. SLP facilitated session by providing skilled observation with his lunch meal of regular textures with thin liquids. Despite taking multiple large bites, patient demonstrated efficient mastication with complete oral clearance without overt s/s of aspiration. Recommend patient continue current diet. Throughout session, patient appeared brighter, more engaged and was making jokes throughout session which his father reports is more like his baseline personality. Patient was overall Mod I for sustained attention to self-feeding for ~25 minutes. Patient left upright in bed with father present. Continue with current plan of care.      Pain No/Denies Pain   Agitated Behavior Scale: TBI  ABS discontinued d/t ABS score less than 20 for the last three days or no behaviors present   Therapy/Group: Individual Therapy  Callin Ashe 10/04/2021, 12:53 PM

## 2021-10-04 NOTE — Progress Notes (Signed)
Occupational Therapy TBI Note  Patient Details  Name: Jeff [name] MRN: [id number] Date of Birth: April 19, 2000  Today's Date: 10/04/2021 OT Individual Time: 5056-9794 OT Individual Time Calculation (min): 59 min    Short Term Goals: Week 2:  OT Short Term Goal 1 (Week 2): pt will complete 3/4 steps of donning shirt with AE PRN OT Short Term Goal 1 - Progress (Week 2): Met OT Short Term Goal 2 (Week 2): Pt will thread BLE wiht no A for managing RLE and AE PRN OT Short Term Goal 2 - Progress (Week 2): Progressing toward goal OT Short Term Goal 3 (Week 2): Pt will complete 1/3 components of toileting OT Short Term Goal 3 - Progress (Week 2): Met OT Short Term Goal 4 (Week 2): Pt will transfer to [facility] with MIN A OT Short Term Goal 4 - Progress (Week 2): Met  Skilled Therapeutic Interventions/Progress Updates:     Pt received in bed with pain and morning meds being delivered. No pain reported but grimacing per usual with bed mobility. Rest provided throughout. Dressing needs met.   ADL: Pt completes grooming at sink seated with radial nerve palsy splint and RUE in saebo MAS to improve shoulder activation and decrease need for propping on sink for all grooming: oral care, face washing and brushing hair. Pt able to reach up to top of head with saebo MAS on level 4 assistance with decreased head compensatory movements. OT applies elastic laces to shoes and pt able to don L shoe with LH shoe horn but needs A to don R shoe and toe up brace.   Therapeutic activity Wii bowling for RUE activation and recall of use of novel remote and game from a few days ago in PT. Pt able to recall all controls and buttons with no cuing and adjust player in game appropriately. Pt enjoys activity by smiling and laughing with OT.   Pt left at end of session in w/c  with dad present in room, call light in reach and all needs met   Therapy Documentation Precautions:  Precautions Precautions:  Posterior Hip, Fall Precaution Booklet Issued: Yes (comment) Precaution Comments: R posterior hip precautions Required Braces or Orthoses: Splint/Cast Knee Immobilizer - Right: Other (comment) Splint/Cast: R thumb spica, LUE has two splint options- Radial Nerve palsy with max wear time 2 hours and a wrist cock up splint. must sleep in the wrist cock up at night Restrictions Weight Bearing Restrictions: Yes RUE Weight Bearing: Weight bearing as tolerated LUE Weight Bearing: Weight bearing as tolerated RLE Weight Bearing: Non weight bearing LLE Weight Bearing: Weight bearing as tolerated Other Position/Activity Restrictions: Per [name] on 09/12/21 okay for weight L UE with transfers and on R UE with splint on L LE WBAT, NWB and hip precautions R LE Agitated Behavior Scale: TBI   ABS discontinued d/t ABS score less than 20 for the last three days or no behaviors present      Therapy/Group: Individual Therapy  [name] 10/04/2021, 6:39 AM

## 2021-10-04 NOTE — Progress Notes (Signed)
Speech Language Pathology Weekly Progress and Session Note  Patient Details  Name: Jeff [name] MRN: [id number] Date of Birth: 2001/01/25  Beginning of progress report period: September 26, 2021 End of progress report period: October 04, 2021  Today's Date: 10/04/2021  Short Term Goals: Week 2: SLP Short Term Goal 1 (Week 2): Patient will sustain attention to functional tasks for 5 minutes with mod A verbal/visual cues for redirection. SLP Short Term Goal 1 - Progress (Week 2): Met SLP Short Term Goal 2 (Week 2): Patient will utilize external aids to orient to time/place/situation with mod A multimodal cues SLP Short Term Goal 2 - Progress (Week 2): Met SLP Short Term Goal 3 (Week 2): Patient will demonstrate basic problem solving skills with mod A multimodal cues SLP Short Term Goal 3 - Progress (Week 2): Met SLP Short Term Goal 4 (Week 2): Pt will consume current diet with minimal s/sx of aspiration and with supervision verbal cues to implement slow rate of consumption and small bite sizes. SLP Short Term Goal 4 - Progress (Week 2): Met SLP Short Term Goal 5 (Week 2): Patient will verbalize wants/needs at the phrase level with overall Mod A multimodal cues. SLP Short Term Goal 5 - Progress (Week 2): Met    New Short Term Goals: Week 3: SLP Short Term Goal 1 (Week 3): Patient will demonstrate selective attention to functional tasks in a mildly distracting enviornment for 30 minutes with Min verbal cues for redirection. SLP Short Term Goal 2 (Week 3): Patient will demonstrate functional problem solving for mildly complex tasks with Min verbal cues. SLP Short Term Goal 3 (Week 3): Patient will recall daily, functional information with Min verbal cues for use of external aids.  Weekly Progress Updates: Patient has made functional gains and has met 5 of 5 STGs this reporting period. Currently, patient is consuming regular textures with thin liquids without overt s/s of  aspiration with supervision verbal cues for use of swallowing compensatory strategies. Patient demonstrates improved cognitive functioning and requires overall Mod A multimodal cues to complete functional and familiar tasks safely in regards to attention, recall, and problem solving. Patient also demonstrates improved verbal initiation and overall social engagement with increased ability to express wants/needs appropriately. Patient and family education ongoing. Patient would benefit from continued skilled SLP intervention to maximize his cognitive functioning and overall functional independence prior to discharge.       Intensity: Minumum of 1-2 x/day, 30 to 90 minutes Frequency: 3 to 5 out of 7 days Duration/Length of Stay: 9/23 Treatment/Interventions: Cognitive remediation/compensation;Environmental controls;Internal/external aids;Speech/Language facilitation;Cueing hierarchy;Dysphagia/aspiration precaution training;Functional tasks;Patient/family education;Therapeutic Activities    [place], [place] 10/04/2021, 2:09 PM
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
## 2021-10-06 DIAGNOSIS — S069X0S Unspecified intracranial injury without loss of consciousness, sequela: Secondary | ICD-10-CM

## 2021-10-06 DIAGNOSIS — S069X0D Unspecified intracranial injury without loss of consciousness, subsequent encounter: Secondary | ICD-10-CM

## 2021-10-06 NOTE — Progress Notes (Signed)
Occupational Therapy Session Note  Patient Details  Name: Jeff [name] MRN: [id number] Date of Birth: [birth year]  Today's Date: 10/06/2021 OT Individual Time: 1345-1415 OT Individual Time Calculation (min): 30 min    Short Term Goals: Week 1:  OT Short Term Goal 1 (Week 3): Pt will complete 2/3 components of toileting wiht MIN A and no overt LOB OT Short Term Goal 2 (Week 3): Pt will maintain precautions with RLE throughout transitional movements and no more than min cuing OT Short Term Goal 3 (Week 3): Pt will thread BLE into pants wiht no A and AE PRN OT Short Term Goal 4 (Week 3): Pt will recall use of AE for footwear with no more than min cuing  Skilled Therapeutic Interventions/Progress Updates:     Patient agreeable to participate in OT session. Reports 4/10 pain level; right forearm (reports he hit it previously/a few days ago and it's still sore).   Patient participated in skilled OT session focusing on activity tolerance and endurance while utilizing Squigz on vertical surface. Therapist provided min assist initially then reduced assist to SBA while patient student using RW in order to improve standing balance and activity tolerance required to complete self care tasks such as toileting.  With left radial nerve palsy splint donned, pt utilize his left UE to place approximately 7 Squigz on vertical surface at shoulder level. With pt's right UE, he place remaining squigz on vertical surface at waist level with wrist supinated due to brachial plexus injury limiting his A/ROM. Pt was able to tolerate standing for 3'57" total before requiring a seated rest break. Pt  participated in wheelchair mobility to and from day room (starting at his room) utilizing both upper extremities to propel w/c.  Therapy Documentation Precautions:  Precautions Precautions: Posterior Hip, Fall Precaution Booklet Issued: Yes (comment) Precaution Comments: R posterior hip  precautions Required Braces or Orthoses: Splint/Cast Knee Immobilizer - Right: Other (comment) Splint/Cast: R thumb spica, LUE has two splint options- Radial Nerve palsy with max wear time 2 hours and a wrist cock up splint. must sleep in the wrist cock up at night Restrictions Weight Bearing Restrictions: Yes RUE Weight Bearing: Weight bearing as tolerated LUE Weight Bearing: Weight bearing as tolerated RLE Weight Bearing: Non weight bearing LLE Weight Bearing: Weight bearing as tolerated Other Position/Activity Restrictions: Per [name] on 09/12/21 okay for weight L UE with transfers and on R UE with splint on L LE WBAT, NWB and hip precautions R LE   Therapy/Group: Individual Therapy  Limmie Patricia, OTR/L,CBIS  Supplemental OT - [facility]  10/06/2021, 1:01 PM

## 2021-10-06 NOTE — Progress Notes (Signed)
Physical Therapy Session Note  Patient Details  Name: Jeff [name] MRN: [id number] Date of Birth: [birth year]  Today's Date: 10/06/2021 PT Individual Time: 0903-1000 PT Individual Time Calculation (min): 57 min   Short Term Goals: Week 2:  PT Short Term Goal 1 (Week 2): Patient will perform bed mobility with CGA consistently. PT Short Term Goal 1 - Progress (Week 2): Progressing toward goal PT Short Term Goal 2 (Week 2): Patient will perform basic transfer with CGA using LRAD. PT Short Term Goal 2 - Progress (Week 2): Met PT Short Term Goal 3 (Week 2): Patient will initiate w/c mobility. PT Short Term Goal 3 - Progress (Week 2): Met  Skilled Therapeutic Interventions/Progress Updates: Pt presents sitting in w/c and agreeable to therapy.  Pt c/o minimal pain initially, but increased to 7/10 to R LE.  Pt has already been given pain meds this AM.  Pt allowed rest breaks and repositioning for pain management.  Pt wheeled x 150' w/ supervision B UE s but does list to left.  Pt performed LE there ex for warm-up to standing.  Pt performed calf raises, LAQ, hip flexion Left only (education for reasoning behind not performing on RLE), and abd/add w/ AAROM RLE.  Pt performed multiple sit to stand w/ CGA slowly but requires cueing to go step by step for initiation to maintain NWB and hip precautions for R LE.  Pt able to take 1 hop back to seat but using knee flexion and extension for hop.  Pt performed standing hip flexion RLE 4 x 3 before c/o inc pain.  Pt returned to room w/ supervision x 80' w/ cueing for finding room.  Pt remained sitting in w/c and all needs in reach, father present.     Therapy Documentation Precautions:  Precautions Precautions: Posterior Hip, Fall Precaution Booklet Issued: Yes (comment) Precaution Comments: R posterior hip precautions Required Braces or Orthoses: Splint/Cast Knee Immobilizer - Right: Other (comment) Splint/Cast: R thumb spica, LUE has two  splint options- Radial Nerve palsy with max wear time 2 hours and a wrist cock up splint. must sleep in the wrist cock up at night Restrictions Weight Bearing Restrictions: Yes RUE Weight Bearing: Weight bearing as tolerated LUE Weight Bearing: Weight bearing as tolerated RLE Weight Bearing: Non weight bearing LLE Weight Bearing: Weight bearing as tolerated Other Position/Activity Restrictions: Per [name] on 09/12/21 okay for weight L UE with transfers and on R UE with splint on L LE WBAT, NWB and hip precautions R LE General:   Vital Signs:   Pain:7/10 Pain Assessment Pain Scale: 0-10 Pain Score: 0-No pain     Therapy/Group: Individual Therapy  Ladoris Gene 10/06/2021, 12:20 PM

## 2021-10-06 NOTE — Progress Notes (Signed)
Speech Language Pathology TBI Note  Patient Details  Name: Raykwon Hobbs MRN: [id number] Date of Birth: [birth year]  Today's Date: 10/06/2021 SLP Individual Time: 0820-0901 SLP Individual Time Calculation (min): 41 min  Short Term Goals: Week 3: SLP Short Term Goal 1 (Week 3): Patient will demonstrate selective attention to functional tasks in a mildly distracting enviornment for 30 minutes with Min verbal cues for redirection. SLP Short Term Goal 2 (Week 3): Patient will demonstrate functional problem solving for mildly complex tasks with Min verbal cues. SLP Short Term Goal 3 (Week 3): Patient will recall daily, functional information with Min verbal cues for use of external aids.  Skilled Therapeutic Interventions: Skilled treatment session focused on cognitive goals. Upon arrival, patient was awake while upright in the wheelchair. SLP facilitated session by providing Mod A verbal cues for use of external aids for recall of date. SLP also re-educated both the patient and his father on the importance of utilizing his memory notebook to maximize recall and carryover of daily and functional information. Both verbalized understanding and agreement. SLP also facilitated session by providing Mod-Max A verbal cues for functional problem solving during a navigation task and Min verbal cues for use of memory compensatory strategies to maximize recall during session. Patient left upright in wheelchair with father present. Continue with current plan of care.      Pain Pain Assessment Pain Scale: 0-10 Pain Score: 0-No pain  Agitated Behavior Scale: TBI  ABS discontinued d/t ABS score less than 20 for the last three days or no behaviors present   Therapy/Group: Individual Therapy  Linh Johannes 10/06/2021, 12:34 PM
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
## 2021-10-07 ENCOUNTER — Inpatient Hospital Stay (HOSPITAL_COMMUNITY): Payer: Medicaid Other

## 2021-10-07 DIAGNOSIS — S069X9D Unspecified intracranial injury with loss of consciousness of unspecified duration, subsequent encounter: Secondary | ICD-10-CM

## 2021-10-07 NOTE — Patient Care Conference (Signed)
Inpatient RehabilitationTeam Conference and Plan of Care Update Date: 10/07/2021   Time: 10:34 AM    Patient Name: Jeff [name]      Medical Record Number: [id number]  Date of Birth: [birth year] Sex: Male         Room/Bed: 4W13C/4W13C-01 Payor Info: Payor: [place] / Plan: [place] MEDICAID [facility] / Product Type: *No Product type* /    Admit Date/Time:  09/19/2021  6:25 PM  Primary Diagnosis:  TBI (traumatic brain injury) [facility])  Hospital Problems: Principal Problem:   TBI (traumatic brain injury) [facility])    Expected Discharge Date: Expected Discharge Date: 10/11/21  Team Members Present: Physician leading conference: Dr. Jennye Boroughs Social Worker Present: Loralee Pacas, [place] Nurse Present: Other (comment) Tacy Learn, RN) PT Present: Apolinar Junes, PT OT Present: Mariane Masters, OT SLP Present: Weston Anna, SLP PPS Coordinator present : Gunnar Fusi, SLP     Current Status/Progress Goal Weekly Team Focus  Bowel/Bladder   Continent of B/B. LBM 9/11  Maintain B/B function  Assist with bathroom previllages   Swallow/Nutrition/ Hydration   Regular textures with thin liquids, supervision  Supervision  Goal Met   ADL's   CGA-MIN A LB ADLs, CGA UB ADLs, MIN A toileting, MIN A transfers with RW  min-CGA  toielting, standing tolerance, adherence to precautions, BUE use   Mobility   Min A-CGA bed mobility, CGA-supervision transfers and gait up to 35 ft using RW, supervision w/c >200 ft  CGA-supervision overall  activity tolerance, functional mobility, balance, adherance to precautions, gait training, w/c mobility, strength/ROM, patient/caregiver education   Communication   Supervision  Min A  Goal Met   Safety/Cognition/ Behavioral Observations  [place] Level V-VI, Mod A  Min A  recall with use of strategies, selective attention, functonal problem solving, emergent awareness   Pain   generalized pain. Gets pain meds   < 3/10. Assess and treat.  Assess Q4 and PRN   Skin   Multiple incisions  Prevent new skin break down  Assess QS and PRN     Discharge Planning:  D/c to home with his parents who will provide 24/7 care. Pt father works from home, and mother avaialble to assist with care needs.   Team Discussion: TBI. Patient both con/inc LBM 09/11. Pain controlled with Tramadol. Right heel has stage I. Therapy goals being met.   Patient on target to meet rehab goals: yes, discharge date moved to 09/16.  *See Care Plan and progress notes for long and short-term goals.   Revisions to Treatment Plan:  Moved discharge date up d/t patient meeting goals  Teaching Needs: Medications, safety, gait/transfer training, timed toileting, skin/wound care, etc.   Current Barriers to Discharge: Decreased caregiver support, Home enviroment access/layout, Incontinence, and Wound care  Possible Resolutions to Barriers: Family education, skin/wound care, medication education, order recommended DME     Medical Summary Current Status: TBI, DVT, insomnia, leukocytosis, ABLA, constipation  Barriers to Discharge: Weight bearing restrictions;Behavior;Home enviroment access/layout;Medical stability;Wound care  Barriers to Discharge Comments: TBI, DVT, insomnia, leukocytosis, ABLA, constipation Possible Resolutions to [employer] Focus: monitor labs, trazadone prn, consider change to apixaban   Continued Need for Acute Rehabilitation Level of Care: The patient requires daily medical management by a physician with specialized training in physical medicine and rehabilitation for the following reasons: Direction of a multidisciplinary physical rehabilitation program to maximize functional independence : Yes Medical management of patient stability for increased activity during participation in an intensive rehabilitation regime.: Yes Analysis  of laboratory values and/or radiology reports with any subsequent need for  medication adjustment and/or medical intervention. : Yes   I attest that I was present, lead the team conference, and concur with the assessment and plan of the team.   Ernest Pine 10/07/2021, 1:33 PM

## 2021-10-07 NOTE — Progress Notes (Signed)
Physical Therapy Session Note  Patient Details  Name: Jeff [name] MRN: [id number] Date of Birth: 12-15-2000  Today's Date: 10/07/2021 PT Individual Time: 7829-5621 and 3086-5784 PT Individual Time Calculation (min): 20 min and 30 min   Short Term Goals: Week 3:  PT Short Term Goal 1 (Week 3): STG=LTG due to ELOS.  Skilled Therapeutic Interventions/Progress Updates:     Session 1: Patient in w/c with his father in the room upon PT arrival. Patient alert and agreeable to PT session. Patient reported 3-4/10 R hip/thigh pain during session, RN made aware. PT provided repositioning, rest breaks, and distraction as pain interventions throughout session.   Patient reporting increased fatigue from poor sleep last night. Declined ambulation trials due to fatigue and requested to get to be to nap. Oriented patient to time, as it was just before lunch and encouraged him to stay up until after lunch to ensure nutritional intake. Patient in agreement.   Performed blocked practice sit to stands with supervision and only 1 cue for placing his R foot forward. Demonstrated safe technique and good use of RW throughout.   Patient continues to report increased fatigue. Performed seated AAROM LAQ on R 2x5, noted partial range activation of DF during exercises.   Patient declined further activity due to fatigue. Patient missed 10 min of skilled PT due to fatigue, RN made aware. Will attempt to make-up missed time as able.    Patient in w/c in the room, father stepped out, at end of session with breaks locked and all needs within reach. Trial patient OOB without 1-1 supervision or alarm, as patient has not demonstrated impulsivity during stay, LPN made aware and in agreement, also in line of sight of patient.  Session 2:  Patient in w/c in the room upon PT arrival. Patient alert and agreeable to PT session. Patient reported 5/10 R hip pain and B lower extremity soreness with gait during  session, RN made aware. PT provided repositioning, rest breaks, and distraction as pain interventions throughout session.   Focused session on gait training with patient selected music with motivation. Patient unable to recall plan to work on this activity, required max cues for recall.   Therapeutic Activity: Transfers: Patient performed sit to/from stand x2 with supervision using RW, as above.  Gait Training:  Set patient up to ambulate 25 feet to a cone turn and return to his chair for a total of 50 feet. Patient ambulated 30 feet and reported his legs were "going to give out" and was unable to continue, w/c brought to patient for seated rest break. Attempted to complete the remaining 20 feet, again patient with increased fatigue and requested to sit, despite encouragement, after 15 feet using RW with CGA. Ambulated with hop-to gait pattern on L with poor control of foot placement and shoulder elevation. Provided verbal cues for shoulder depression and retraction and tricep activation to improve upper extremity use and control foot placement.  Patient in w/c handed off to [place], [place], at end of session.   Therapy Documentation Precautions:  Precautions Precautions: Posterior Hip, Fall Precaution Booklet Issued: Yes (comment) Precaution Comments: R posterior hip precautions Required Braces or Orthoses: Splint/Cast Knee Immobilizer - Right: Other (comment) Splint/Cast: R thumb spica, LUE has two splint options- Radial Nerve palsy with max wear time 2 hours and a wrist cock up splint. must sleep in the wrist cock up at night Restrictions Weight Bearing Restrictions: Yes RUE Weight Bearing: Weight bearing as tolerated LUE Weight Bearing: Weight  bearing as tolerated RLE Weight Bearing: Non weight bearing LLE Weight Bearing: Weight bearing as tolerated Other Position/Activity Restrictions: Per [name] on 09/12/21 okay for weight L UE with transfers and on R UE with splint on L LE WBAT, NWB  and hip precautions R LE General: PT Amount of Missed Time (min): 10 Minutes PT Missed Treatment Reason: Patient fatigue    Therapy/Group: Individual Therapy  Vollie Brunty L Alexandro Line PT, DPT, NCS, CBIS  10/07/2021, 7:46 PM

## 2021-10-07 NOTE — Progress Notes (Signed)
Patient ID: Jeff [name], male   DOB: Mar 01, 2000, 21 y.o.   MRN: [id number]  SW met with pt, pt mother, and pt sister using [employer] [id number] to provide updates from team conference, change in d/c date from 9/23 to 9/16 due to gains made in rehab. SW discussed DME: RW, w/c, BSC, and TTB will be ordered through [place]. Preferred outpatient location is [facility]. SW discussed family education. Fam edu scheduled for Thursday (9/14) 1pm-3pm with pt mother, father, and sister.   SW ordered DME via parachute.   81- SW made efforts to update patient sister Jeff [name] but no answer, and unable to leave a message as voicemail is full.   Loralee Pacas, MSW, [place] Office: [phone] Cell: [phone] Fax: [phone]

## 2021-10-07 NOTE — Plan of Care (Signed)
  Problem: Consults Goal: RH BRAIN INJURY PATIENT EDUCATION Description: Description: See Patient Education module for eduction specifics Outcome: Progressing Goal: Skin Care Protocol Initiated - if Braden Score 18 or less Description: If consults are not indicated, leave blank or document N/A Outcome: Progressing   Problem: RH BOWEL ELIMINATION Goal: RH STG MANAGE BOWEL WITH ASSISTANCE Description: STG Manage Bowel with max Assistance. Outcome: Progressing Goal: RH STG MANAGE BOWEL W/MEDICATION W/ASSISTANCE Description: STG Manage Bowel with Medication with mod Assistance. Outcome: Progressing   Problem: RH BLADDER ELIMINATION Goal: RH STG MANAGE BLADDER WITH ASSISTANCE Description: STG Manage Bladder With min Assistance Outcome: Progressing   Problem: RH SKIN INTEGRITY Goal: RH STG SKIN FREE OF INFECTION/BREAKDOWN Description: Skin will be free of any additional infection/breakdown from admission into rehab Outcome: Progressing Goal: RH STG MAINTAIN SKIN INTEGRITY WITH ASSISTANCE Description: STG Maintain Skin Integrity With min Assistance. Outcome: Progressing Goal: RH STG ABLE TO PERFORM INCISION/WOUND CARE W/ASSISTANCE Description: STG Able To Perform Incision/Wound Care With min Assistance with caregiver. Outcome: Progressing   Problem: RH SAFETY Goal: RH STG ADHERE TO SAFETY PRECAUTIONS W/ASSISTANCE/DEVICE Description: STG Adhere to Safety Precautions With min Assistance/Device with cueing Outcome: Progressing Goal: RH STG DECREASED RISK OF FALL WITH ASSISTANCE Description: STG Decreased Risk of Fall With min Assistance. Outcome: Progressing   Problem: RH COGNITION-NURSING Goal: RH STG USES MEMORY AIDS/STRATEGIES W/ASSIST TO PROBLEM SOLVE Description: STG Uses Memory Aids/Strategies With min Assistance to Problem Solve with cueing Outcome: Progressing   Problem: RH PAIN MANAGEMENT Goal: RH STG PAIN MANAGED AT OR BELOW PT'S PAIN GOAL Description: Pain will be  managed at 3 out of 10 on pain scale Outcome: Progressing   Problem: RH KNOWLEDGE DEFICIT BRAIN INJURY Goal: RH STG INCREASE KNOWLEDGE OF SELF CARE AFTER BRAIN INJURY Description: Patient/caregiver will be able to verbalize increased knowledge of weight-bearing restrictions, skin/wound care, trach care, and self care from nursing education, handouts and therapy Outcome: Progressing

## 2021-10-07 NOTE — Progress Notes (Signed)
Physical Therapy Session Note  Patient Details  Name: Jeff [name] MRN: [id number] Date of Birth: [birth year]  Today's Date: 10/07/2021 PT Individual Time: 1002-1028 PT Individual Time Calculation (min): 26 min   Short Term Goals: Week 3:  PT Short Term Goal 1 (Week 3): STG=LTG due to ELOS.  Skilled Therapeutic Interventions/Progress Updates:     Pt received seated in [name] and agrees to therapy. Reports pain in R leg near foot. 5/10 in severity. PT provides rest breaks and mobility as needed to manage pain. WC transport to gym for time management. Pt performs sit to stand with RW and minA, with cues for body mechanics. Pt ambulates x25' with RW and CGA, with cues for posture, positioning of bilateral lower extremities, and sequencing of transfer back to [facility] for safety. Pt takes extended seated rest break secondary to fatigue. Pt attempts to shoot basketball but does not have sufficient strength or control of upper extremities to hold onto ball and lift to eye level. Pt ambulates final bout of 25' with RW and minA. Pt requests to sit back down after about 10' but is able to continue gait with verbal encouragement for 15' additional. PT provides cues for positioning and hand placement for safe transfer back to WC. Pt left seated in WC in room with all needs within reach.  Therapy Documentation Precautions:  Precautions Precautions: Posterior Hip, Fall Precaution Booklet Issued: Yes (comment) Precaution Comments: R posterior hip precautions Required Braces or Orthoses: Splint/Cast Knee Immobilizer - Right: Other (comment) Splint/Cast: R thumb spica, LUE has two splint options- Radial Nerve palsy with max wear time 2 hours and a wrist cock up splint. must sleep in the wrist cock up at night Restrictions Weight Bearing Restrictions: Yes RUE Weight Bearing: Weight bearing as tolerated LUE Weight Bearing: Weight bearing as tolerated RLE Weight Bearing: Non weight bearing LLE  Weight Bearing: Weight bearing as tolerated Other Position/Activity Restrictions: Per [name] on 09/12/21 okay for weight L UE with transfers and on R UE with splint on L LE WBAT, NWB and hip precautions R LE   Therapy/Group: Individual Therapy  Beau Fanny, PT, DPT 10/07/2021, 5:38 PM

## 2021-10-07 NOTE — Progress Notes (Signed)
Speech Language Pathology Daily TBI Session Note  Patient Details  Name: Jeff [name] MRN: [id number] Date of Birth: [birth year]  Today's Date: 10/07/2021 SLP Individual Time: 2563-8937 SLP Individual Time Calculation (min): 30 min  Short Term Goals: Week 3: SLP Short Term Goal 1 (Week 3): Patient will demonstrate selective attention to functional tasks in a mildly distracting enviornment for 30 minutes with Min verbal cues for redirection. SLP Short Term Goal 2 (Week 3): Patient will demonstrate functional problem solving for mildly complex tasks with Min verbal cues. SLP Short Term Goal 3 (Week 3): Patient will recall daily, functional information with Min verbal cues for use of external aids.  Skilled Therapeutic Interventions: Skilled treatment session focused on cognitive goals. SLP facilitated session by providing total A for utilization of his memory notebook to recall events from previous therapy sessions. Patient requested an external aid to be created for his room to maximize utilization. Patient did recall 1/2 posterior hip precautions and was able to independently donn his left hand splint with extra time. Patient demonstrated selective attention to tasks for ~20 minutes with Supervision verbal cues for redirection.      Pain No/Denies Pain   Agitated Behavior Scale  ABS discontinued d/t ABS score less than 20 for the last three days or no behaviors present   Therapy/Group: Individual Therapy  Aadyn Buchheit 10/07/2021, 3:09 PM
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
## 2021-10-07 NOTE — Progress Notes (Signed)
Occupational Therapy Session Note  Patient Details  Name: Jeff [name] MRN: [id number] Date of Birth: 2000/06/25  Today's Date: 10/07/2021 OT Individual Time: 1305-1330 OT Individual Time Calculation (min): 25 min    Short Term Goals: Week 1:  OT Short Term Goal 1 (Week 1): Pt will compelte bed<>chair transfer wiht MOD A of 1 in prep for toielting OT Short Term Goal 1 - Progress (Week 1): Met OT Short Term Goal 2 (Week 1): Pt will don shirt wiht MOD A OT Short Term Goal 2 - Progress (Week 1): Met OT Short Term Goal 3 (Week 1): Pt will groom with supervision OT Short Term Goal 3 - Progress (Week 1): Met OT Short Term Goal 4 (Week 1): Pt will sit EOM with MIN A for 5 min during functional task OT Short Term Goal 4 - Progress (Week 1): Met  Skilled Therapeutic Interventions/Progress Updates:    Pt receive in bed w/c with NT finishing vitals. Pt completes w/c propulsion to/from spaces with suprevision and increased time for activity tolerance. Pt set up in [facility] for RUE shoulder support. Pt completes graded pipe tree with picture figure to match with no errors between 2 figures with head height reaching in saebo mas at level 3 assistance. Exited session with pt seated in w/c, exit alarm on and call light in reach   ABS discontinued d/t ABS score less than 20 for the last three days or no behaviors present   Therapy Documentation Precautions:  Precautions Precautions: Posterior Hip, Fall Precaution Booklet Issued: Yes (comment) Precaution Comments: R posterior hip precautions Required Braces or Orthoses: Splint/Cast Knee Immobilizer - Right: Other (comment) Splint/Cast: R thumb spica, LUE has two splint options- Radial Nerve palsy with max wear time 2 hours and a wrist cock up splint. must sleep in the wrist cock up at night Restrictions Weight Bearing Restrictions: Yes RUE Weight Bearing: Weight bearing as tolerated LUE Weight Bearing: Weight bearing as  tolerated RLE Weight Bearing: Non weight bearing LLE Weight Bearing: Weight bearing as tolerated Other Position/Activity Restrictions: Per [name] on 09/12/21 okay for weight L UE with transfers and on R UE with splint on L LE WBAT, NWB and hip precautions R LE General:      Therapy/Group: Individual Therapy  [name] 10/07/2021, 1:33 PM

## 2021-10-07 NOTE — Progress Notes (Signed)
Occupational Therapy Session Note  Patient Details  Name: Jeff [name] MRN: [id number] Date of Birth: 2001-01-17  Today's Date: 10/07/2021 OT Individual Time: 0907-1000 OT Individual Time Calculation (min): 53 min    Short Term Goals: Week 1:  OT Short Term Goal 1 (Week 1): Pt will compelte bed<>chair transfer wiht MOD A of 1 in prep for toielting OT Short Term Goal 1 - Progress (Week 1): Met OT Short Term Goal 2 (Week 1): Pt will don shirt wiht MOD A OT Short Term Goal 2 - Progress (Week 1): Met OT Short Term Goal 3 (Week 1): Pt will groom with supervision OT Short Term Goal 3 - Progress (Week 1): Met OT Short Term Goal 4 (Week 1): Pt will sit EOM with MIN A for 5 min during functional task OT Short Term Goal 4 - Progress (Week 1): Met  Skilled Therapeutic Interventions/Progress Updates:     Pt received in bed with unrated LLE pain rest and repositioning.  provided for pain relief  ADL: Pt completes ADL at overall MIN A Level. Skilled interventions include: reacher used for threading head and BLE. Pt unable to recall adaptive strategy to thread RLE first. Pt requires significantly increased time to thread RLE second. Pt able to stand with MIN A to advance pants past hips. MOD A to don shoes  Therapeutic activity W/c propulsion with setup, cuing and increased time to manage parts with MOD A for leg rests on and off. Pt requires 1 trial stand with RW before retrying to stand pivot to mat. Pt completes standing balance with MIN A for small lateral LOB while reaching for clothes pins with BUE and placing on theraband on mirror anterior to pt.   Pt left at end of session in bed with exit alarm on, call light in reach and all needs met   Therapy Documentation Precautions:  Precautions Precautions: Posterior Hip, Fall Precaution Booklet Issued: Yes (comment) Precaution Comments: R posterior hip precautions Required Braces or Orthoses: Splint/Cast Knee Immobilizer -  Right: Other (comment) Splint/Cast: R thumb spica, LUE has two splint options- Radial Nerve palsy with max wear time 2 hours and a wrist cock up splint. must sleep in the wrist cock up at night Restrictions Weight Bearing Restrictions: Yes RUE Weight Bearing: Weight bearing as tolerated LUE Weight Bearing: Weight bearing as tolerated RLE Weight Bearing: Non weight bearing LLE Weight Bearing: Weight bearing as tolerated Other Position/Activity Restrictions: Per [name] on 09/12/21 okay for weight L UE with transfers and on R UE with splint on L LE WBAT, NWB and hip precautions R LE General:  ABS discontinued d/t ABS score less than 20 for the last three days or no behaviors present    Therapy/Group: Individual Therapy  [name] 10/07/2021, 6:42 AM

## 2021-10-08 DIAGNOSIS — S71101D Unspecified open wound, right thigh, subsequent encounter: Secondary | ICD-10-CM

## 2021-10-08 DIAGNOSIS — S71001D Unspecified open wound, right hip, subsequent encounter: Secondary | ICD-10-CM

## 2021-10-08 DIAGNOSIS — I824Y1 Acute embolism and thrombosis of unspecified deep veins of right proximal lower extremity: Secondary | ICD-10-CM

## 2021-10-08 LAB — CBC
HCT: 39 % (ref 39.0–52.0)
Hemoglobin: 12.7 g/dL — ABNORMAL LOW (ref 13.0–17.0)
MCH: 29.3 pg (ref 26.0–34.0)
MCHC: 32.6 g/dL (ref 30.0–36.0)
MCV: 89.9 fL (ref 80.0–100.0)
Platelets: 303 10*3/uL (ref 150–400)
RBC: 4.34 MIL/uL (ref 4.22–5.81)
RDW: 13.4 % (ref 11.5–15.5)
WBC: 7.1 10*3/uL (ref 4.0–10.5)
nRBC: 0 % (ref 0.0–0.2)

## 2021-10-08 LAB — CREATININE, SERUM
Creatinine, Ser: 0.63 mg/dL (ref 0.61–1.24)
GFR, Estimated: >60 mL/min (ref >60)

## 2021-10-08 MED ORDER — APIXABAN 5 MG PO TABS
5.0000 mg | ORAL_TABLET | Freq: Two times a day (BID) | ORAL | Status: DC
  Administered 2021-10-08 – 2021-10-11 (×7): 5 mg via ORAL
  Filled 2021-10-08 (×7): qty 1

## 2021-10-08 MED ORDER — APIXABAN 5 MG PO TABS: 5.0000 mg | ORAL_TABLET | Freq: Two times a day (BID) | ORAL | Status: DC

## 2021-10-08 MED ORDER — APIXABAN 5 MG PO TABS: 10.0000 mg | ORAL_TABLET | Freq: Two times a day (BID) | ORAL | Status: DC

## 2021-10-08 NOTE — Progress Notes (Signed)
Pharmacy Consult - Lovenox full dose for acute RLE DVT and age indeterminate RUE DVT.     Currently on Lovenox 80mg  Q12 hr - updated weight 64.7 kg Goal 4 hour anti-Xa level = 0.6-1 units/ml  9/9: 4-hour antiXa level is 0.74  No additional labs  CBC stable - Hgb 12.7 and plts WNL. No signs or symptoms of bleeding noted, hx recent SDH (07/2021).  Scr is stable <1.   Plan: Continue Lovenox 80 mg sq Q 12 hours Follow CBC, renal function.   Follow up plan for long term anticoagulation.  Thank you for allowing pharmacy to be part of this patients care team.  10-24-1981 BS, PharmD, BCPS Clinical Pharmacist 10/08/2021 7:33 AM  Contact: [phone] after 3 PM  "Be curious, not judgmental..." -[phone]
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
## 2021-10-08 NOTE — Progress Notes (Addendum)
Patient ID: Jeff [name], male   DOB: [birth year], 21 y.o.   MRN: [id number]  SW faxed outpatient PT/OT/SLP referral to [facility] (p:[phone]/f:[phone]).  Family prefers shower chair instead. SW cancelled order for TTB and ordered shower chair with [employer] via parachute.   Spanish interpreter confirmed for family edu tomorrow, 1pm-3pm.   Cecile Sheerer, MSW, LCSWA Office: [phone] Cell: [phone] Fax: [phone]

## 2021-10-08 NOTE — Progress Notes (Signed)
Cranial CT scan follow-up unremarkable no hemorrhage noted.  Will transition Lovenox to Eliquis for DVT.  Patient is scheduled for discharge to home 10/11/2021.

## 2021-10-08 NOTE — Plan of Care (Signed)
  Problem: RH Cognition - SLP Goal: RH LTG Patient will demonstrate orientation with cues Description:  LTG:  Patient will demonstrate orientation to person/place/time/situation with cues (SLP)   Flowsheets (Taken 10/08/2021 1225) LTG: Patient will demonstrate orientation using cueing (SLP): Moderate Assistance - Patient 50 - 74% Note: Goal downgraded due to slow progress    Problem: RH Problem Solving Goal: LTG Patient will demonstrate problem solving for (SLP) Description: LTG:  Patient will demonstrate problem solving for basic/complex daily situations with cues  (SLP) Flowsheets (Taken 10/08/2021 1225) LTG Patient will demonstrate problem solving for: Moderate Assistance - Patient 50 - 74% Note: Goal downgraded due to slow progress    Problem: RH Memory Goal: LTG Patient will use memory compensatory aids to (SLP) Description: LTG:  Patient will use memory compensatory aids to recall biographical/new, daily complex information with cues (SLP) Flowsheets (Taken 10/08/2021 1225) LTG: Patient will use memory compensatory aids to (SLP): Maximal Assistance - Patient 25 - 49% Note: Goal downgraded due to slow progress    Problem: RH Attention Goal: LTG Patient will demonstrate this level of attention during functional activites (SLP) Description: LTG:  Patient will will demonstrate this level of attention during functional activites (SLP) Flowsheets (Taken 10/08/2021 1225) LTG: Patient will demonstrate this level of attention during cognitive/linguistic activities with assistance of (SLP): Moderate Assistance - Patient 50 - 74% Note: Goal downgraded due to slow progress

## 2021-10-08 NOTE — Progress Notes (Signed)
Speech Language Pathology TBI Note  Patient Details  Name: Jeff [name] MRN: [id number] Date of Birth: Dec 21, 2000  Today's Date: 10/08/2021 SLP Individual Time: 5329-9242 SLP Individual Time Calculation (min): 58 min  Short Term Goals: Week 3: SLP Short Term Goal 1 (Week 3): Patient will demonstrate selective attention to functional tasks in a mildly distracting enviornment for 30 minutes with Min verbal cues for redirection. SLP Short Term Goal 2 (Week 3): Patient will demonstrate functional problem solving for mildly complex tasks with Min verbal cues. SLP Short Term Goal 3 (Week 3): Patient will recall daily, functional information with Min verbal cues for use of external aids.  Skilled Therapeutic Interventions: Skilled treatment session focused on cognitive goals. SLP facilitated session by re-administering the Cognistat. Patient scored WFL on all subtests with the exception of mild impairments in naming, orientation and attention. However, suspect attention subtest was impacted by working memory. Patient did score WFL on short-term recall, however, overall Max A multimodal cues are needed for recall of functional, daily information with use of memory notebook. Patient recalled his posterior hip precautions with overall supervision level verbal cues. Patient also identified what precaution was not being utilized during functional tasks in pictures as well as possible solutions with overall Min verbal cues. Patient left upright in wheelchair with his father present. Continue with current plan of care.      Pain No/Denies Pain   Agitated Behavior Scale: TBI  ABS discontinued d/t ABS score less than 20 for the last three days or no behaviors present   Therapy/Group: Individual Therapy  Azlyn Wingler 10/08/2021, 12:08 PM

## 2021-10-08 NOTE — Progress Notes (Signed)
Physical Therapy Session Note  Patient Details  Name: Jeff [name] MRN: [id number] Date of Birth: 28-Nov-2000  Today's Date: 10/08/2021 PT Individual Time: 0800-0900 PT Individual Time Calculation (min): 60 min   Short Term Goals: Week 3:  PT Short Term Goal 1 (Week 3): STG=LTG due to ELOS.  Skilled Therapeutic Interventions/Progress Updates:     Patient in w/c with his father in the room upon PT arrival. Patient alert and agreeable to PT session. Patient reported 4/10 R leg pain during session, RN made aware. PT provided repositioning, rest breaks, and distraction as pain interventions throughout session.   Focused session on initiation of hands on training with patient's father for car transfers. Reports patient will d/c in a [facility]. Patient's father donned L tennis shoe and R foot-up brace for patient without cues for brace prior to mobility.   Educated on strategies for management of cognitive deficits and TBI symptoms in relation to functional mobility throughout session. Provided patient's father with resources for [facility], support groups, and HEP in [place] and [place] for the patient and his family to review.   Therapeutic Activity: Transfers: Patient performed a simulated van height car transfer with supervision using car frame and arm rests to perform unlevel squat pivot. Provided cues for safe technique. Patient performed sit to/from stand x1 with close supervision. Provided verbal cues for R foot placement ahead of L to reduce hip angle.  Gait Training:  Patient attempted to ambulate during session, unable to initiate due to muscle soreness/fatigue and poor motor planning for hop-to step.   Wheelchair Mobility:  Patient propelled wheelchair >200 feet with supervision for path finding. Patient able to doff B leg rests and manage breaks for transfers. Patient's father able to don leg rested and manage w/c for car transport without cues.   Patient in w/c  with his father in the room at end of session with breaks locked and all needs within reach.   Therapy Documentation Precautions:  Precautions Precautions: Posterior Hip, Fall Precaution Booklet Issued: Yes (comment) Precaution Comments: R posterior hip precautions Required Braces or Orthoses: Splint/Cast Knee Immobilizer - Right: Other (comment) Splint/Cast: R thumb spica, LUE has two splint options- Radial Nerve palsy with max wear time 2 hours and a wrist cock up splint. must sleep in the wrist cock up at night Restrictions Weight Bearing Restrictions: Yes RUE Weight Bearing: Weight bearing as tolerated LUE Weight Bearing: Weight bearing as tolerated RLE Weight Bearing: Non weight bearing LLE Weight Bearing: Weight bearing as tolerated Other Position/Activity Restrictions: Per [name] on 09/12/21 okay for weight L UE with transfers and on R UE with splint on L LE WBAT, NWB and hip precautions R LE    Therapy/Group: Individual Therapy  Dallas Scorsone L Sienna Stonehocker PT, DPT, NCS, CBIS  10/08/2021, 12:58 PM

## 2021-10-08 NOTE — Progress Notes (Signed)
Occupational Therapy Session Note  Patient Details  Name: Jeff [name] MRN: [id number] Date of Birth: May 08, 2000  Today's Date: 10/08/2021 OT Individual Time: 1330-1400 OT Individual Time Calculation (min): 30 min    Short Term Goals: Week 3:  OT Short Term Goal 1 (Week 3): Pt will complete 2/3 components of toileting wiht MIN A and no overt LOB OT Short Term Goal 2 (Week 3): Pt will maintain precautions with RLE throughout transitional movements and no more than min cuing OT Short Term Goal 3 (Week 3): Pt will thread BLE into pants wiht no A and AE PRN OT Short Term Goal 4 (Week 3): Pt will recall use of AE for footwear with no more than min cuing  Skilled Therapeutic Interventions/Progress Updates:    Pt resting in bed upon arrival with father present. OT intervention with focus on bed mobility, DME recommendations, and discharge planning. Supine>sit EOB with min A for BLE mgmt. Squat pivot tranfser to w/c with CGA. Demonstrated use of TTB and shower chair in walk-in shower arrangement. Father reports that a shower chair would work best in home setup. Primary OT notified. Pt returned to room and remained in w/c with father present.  Therapy Documentation Precautions:  Precautions Precautions: Posterior Hip, Fall Precaution Booklet Issued: Yes (comment) Precaution Comments: R posterior hip precautions Required Braces or Orthoses: Splint/Cast Knee Immobilizer - Right: Other (comment) Splint/Cast: R thumb spica, LUE has two splint options- Radial Nerve palsy with max wear time 2 hours and a wrist cock up splint. must sleep in the wrist cock up at night Restrictions Weight Bearing Restrictions: Yes RUE Weight Bearing: Weight bearing as tolerated LUE Weight Bearing: Weight bearing as tolerated RLE Weight Bearing: Non weight bearing LLE Weight Bearing: Weight bearing as tolerated Other Position/Activity Restrictions: Per [name] on 09/12/21 okay for weight L UE with  transfers and on R UE with splint on L LE WBAT, NWB and hip precautions R LE    Pain:  Pt reports, "I'm ok"  Therapy/Group: Individual Therapy  Rich Brave 10/08/2021, 2:01 PM

## 2021-10-08 NOTE — Progress Notes (Signed)
Occupational Therapy TBI Note  Patient Details  Name: Jeff [name] MRN: [id number] Date of Birth: 06-12-2000  Today's Date: 10/08/2021 OT Individual Time: 6808-8110 OT Individual Time Calculation (min): 58 min    Short Term Goals: Week 1:  OT Short Term Goal 1 (Week 1): Pt will compelte bed<>chair transfer wiht MOD A of 1 in prep for toielting OT Short Term Goal 1 - Progress (Week 1): Met OT Short Term Goal 2 (Week 1): Pt will don shirt wiht MOD A OT Short Term Goal 2 - Progress (Week 1): Met OT Short Term Goal 3 (Week 1): Pt will groom with supervision OT Short Term Goal 3 - Progress (Week 1): Met OT Short Term Goal 4 (Week 1): Pt will sit EOM with MIN A for 5 min during functional task OT Short Term Goal 4 - Progress (Week 1): Met  Skilled Therapeutic Interventions/Progress Updates:     Pt received in bed with 5 out of 10 pain in R hip and repositioning for comfort  ADL: Pt completes ADL at overall MIN A Level. Skilled interventions include: MOD cuing for AE strategies for seated dressing using reacher and sock aide to doff/don clothes EOB, pt requires encouragement d/t fatigue, MIN A overall provided for force produciton of RLE extension to don socks with sock aide and thread RLE into pants. Pt able to do this prior date but likely minimally self limiting d/t fatigue. Pt completes CGA squat pivo transfer to w/c with set up of chair. OT uses mobilie arm support under RLE and pt able ot comb hair and brush teeth with cuing to not prop elbow and work shoulder ROM. Hip kit print out provided to dad.  Pt left at end of session in w/c with exit alarm on, call light in reach and all needs met   Therapy Documentation Precautions:  Precautions Precautions: Posterior Hip, Fall Precaution Booklet Issued: Yes (comment) Precaution Comments: R posterior hip precautions Required Braces or Orthoses: Splint/Cast Knee Immobilizer - Right: Other (comment) Splint/Cast: R thumb  spica, LUE has two splint options- Radial Nerve palsy with max wear time 2 hours and a wrist cock up splint. must sleep in the wrist cock up at night Restrictions Weight Bearing Restrictions: Yes RUE Weight Bearing: Weight bearing as tolerated LUE Weight Bearing: Weight bearing as tolerated RLE Weight Bearing: Non weight bearing LLE Weight Bearing: Weight bearing as tolerated Other Position/Activity Restrictions: Per [name] on 09/12/21 okay for weight L UE with transfers and on R UE with splint on L LE WBAT, NWB and hip precautions R LE General:     Agitated Behavior Scale: TBI ABS discontinued d/t ABS score less than 20 for the last three days or no behaviors present      Therapy/Group: Individual Therapy  [name] 10/08/2021, 6:45 AM

## 2021-10-09 DIAGNOSIS — K5900 Constipation, unspecified: Secondary | ICD-10-CM

## 2021-10-09 MED ORDER — POLYETHYLENE GLYCOL 3350 17 G PO PACK
17.0000 g | PACK | Freq: Every day | ORAL | Status: DC
  Administered 2021-10-09 – 2021-10-10 (×2): 17 g via ORAL
  Filled 2021-10-09 (×2): qty 1

## 2021-10-09 NOTE — Progress Notes (Signed)
Physical Therapy Discharge Summary  Patient Details  Name: Jeff [name] MRN: [id number] Date of Birth: 2000-01-30  Date of Discharge from PT service:October 10, 2021  {CHL IP REHAB PT TIME CALCULATION:[id number]}   Patient has met 10 of 10 long term goals due to improved activity tolerance, improved balance, improved postural control, increased strength, increased range of motion, decreased pain, ability to compensate for deficits, improved attention, improved awareness, and improved coordination.  Patient to discharge at a wheelchair level Supervision.   Patient's care partner is independent to provide the necessary physical and cognitive assistance at discharge.  Reasons goals not met: All PT goals met at this time.  Recommendation:  Patient will benefit from ongoing skilled PT services in outpatient setting to continue to advance safe functional mobility, address ongoing impairments in balance, strength/ROM, activity tolerance, functional mobility, gait training, community integration, patient/caregiver education, and minimize fall risk.  Equipment: 16"x16" manual wheelchair with foam cushion, RW  Reasons for discharge: treatment goals met  Patient/family agrees with progress made and goals achieved: Yes  PT Discharge Precautions/Restrictions Precautions Precautions: Posterior Hip;Fall Required Braces or Orthoses: Splint/Cast Splint/Cast: R thumb spica, LUE has two splint options- Radial Nerve palsy splint 4 hours on, 4 hours off, and a wrist cock up splint at night Restrictions Weight Bearing Restrictions: Yes RUE Weight Bearing: Weight bearing as tolerated LUE Weight Bearing: Weight bearing as tolerated RLE Weight Bearing: Non weight bearing LLE Weight Bearing: Weight bearing as tolerated Pain Interference   Vision/Perception     Cognition Overall Cognitive Status: Impaired/Different from baseline (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, per family,  cognition impaired from baseline function) Arousal/Alertness: Awake/alert Orientation Level: Oriented to person;Oriented to place;Oriented to situation Year: 2023 Month: September Attention: Focused;Sustained Focused Attention: Appears intact Sustained Attention: Impaired Sustained Attention Impairment: Verbal basic;Functional basic Memory: Impaired Memory Impairment: Storage deficit;Retrieval deficit;Decreased recall of new information;Decreased short term memory Decreased Short Term Memory: Verbal basic;Functional basic (Functional recall better than verbal) Awareness Impairment: Emergent impairment Problem Solving: Impaired Problem Solving Impairment: Functional basic Safety/Judgment: Impaired (improved, able to recall 3/3 posterior hip precautions) [facility] Scales of Cognitive Functioning: Confused, Appropriate Sensation Sensation Light Touch: Impaired Detail Light Touch Impaired Details: Impaired RUE;Impaired RLE Hot/Cold: Appears Intact Proprioception: Impaired by gross assessment Stereognosis: Not tested Additional Comments: Decreased sensation along peroneal nerve distribution of R LE, decreased sensation of R distal fingers Coordination Gross Motor Movements are Fluid and Coordinated: No Fine Motor Movements are Fluid and Coordinated: No Coordination and Movement Description: Poly truama with neuromuscular injury and NWB R LE limiting gross and fine motor coordination Motor  Motor Motor: Abnormal postural alignment and control;Abnormal tone Motor - Discharge Observations: Slow and deliberate mobility due to neuromuscular impairments and reduce carry-over of motor skills due to cognitive deficits  Mobility Bed Mobility Rolling Right: Independent Rolling Left: Independent Supine to Sit: Supervision/Verbal cueing Sit to Supine: Supervision/Verbal cueing Transfers Sit to Stand: Supervision/Verbal cueing Stand to Sit: Supervision/Verbal cueing Stand Pivot  Transfers: Minimal Assistance - Patient > 75% Squat Pivot Transfers: Supervision/Verbal cueing Transfer (Assistive device): Rolling walker Locomotion  Gait Ambulation: Yes Gait Assistance: Contact Guard/Touching assist;Minimal Assistance - Patient > 75% Gait Distance (Feet): 39 Feet Assistive device: Rolling walker Gait Assistance Details: Verbal cues for gait pattern;Verbal cues for safe use of DME/AE;Verbal cues for technique;Verbal cues for precautions/safety Gait Gait: Yes Gait Pattern: Impaired (hop-to gait pattern on L, NWB on R, decreased UE strenght due to fratures and neuromotor deficits) Stairs / Additional Locomotion  Stairs: No Architect: Yes Wheelchair Assistance: Chartered loss adjuster: Both upper extremities Wheelchair Parts Management: Supervision/cueing Distance: >250 ft  Trunk/Postural Assessment  Cervical Assessment Cervical Assessment: Exceptions to [facility] (head forward) Thoracic Assessment Thoracic Assessment: Exceptions to [facility] (rounded shoulders) Lumbar Assessment Lumbar Assessment: Exceptions to [facility] (post pelvic tilt) Postural Control Postural Control: Deficits on evaluation (delayed/insufficient)  Balance   Extremity Assessment  RLE Assessment RLE Assessment: Exceptions to [facility] Passive Range of Motion (PROM) Comments: WLF, hip flexion limited to 90 deg per hip precautions, DF limited to neurtal General Strength Comments: Grossly 2+ to 3/5 proximally, DF and eversion 1-2/5 LLE Assessment LLE Assessment: Within Functional Limits General Strength Comments: Grossly 4+ to 5/5 throughout   Cherie L Grunenberg PT, DPT, NCS, CBIS  10/09/2021, 8:33 AM
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
## 2021-10-09 NOTE — Progress Notes (Signed)
Patient ID: Jeff [name], male   DOB: January 29, 2000, 21 y.o.   MRN: [id number]  SW informed pt will need home wound vac at discharge.   SW sent order form to [facility]. SW confirmed with ortho that if there are any issues with the wound vac for the family to contact the office.   SW met with pt and his family in room to inform on wound vac being ordered which will be in patient room by tomorrow. SW also discussed waiting on shower chair to be delivered to room, all other DME in room. SW shared outpatient referral has been sent to [facility].   Loralee Pacas, MSW, [place] Office: [phone] Cell: [phone] Fax: [phone]

## 2021-10-09 NOTE — Progress Notes (Signed)
Speech Language Pathology TBI Note  Patient Details  Name: Jeff [name] MRN: [id number] Date of Birth: [birth year]  Today's Date: 10/09/2021 SLP Individual Time: 1300-1330 SLP Individual Time Calculation (min): 30 min  Short Term Goals: Week 3: SLP Short Term Goal 1 (Week 3): Patient will demonstrate selective attention to functional tasks in a mildly distracting enviornment for 30 minutes with Min verbal cues for redirection. SLP Short Term Goal 2 (Week 3): Patient will demonstrate functional problem solving for mildly complex tasks with Min verbal cues. SLP Short Term Goal 3 (Week 3): Patient will recall daily, functional information with Min verbal cues for use of external aids.  Skilled Therapeutic Interventions: Skilled treatment session focused on completion of family education. SLP facilitated session by providing education to the patient, his mother, father, and sister with an interpreter present. Education was provided regarding patient's current cognitive deficits and strategies to utilize at home to maximize attention, problem solving, memory, and overall safety. SLP also stressed the importance of patient being as independent as possible with the use of adaptive equipment as well as "self-talk" through tasks to help facilitate recall and carryover of functional information. All verbalized understanding and handouts were also given to reinforce information. Patient left upright in wheelchair with family present. Continue with current plan of care.      Pain Pain Assessment Pain Scale: 0-10 Pain Score: 0-No pain  Agitated Behavior Scale: TBI  ABS discontinued d/t ABS score less than 20 for the last three days or no behaviors present   Therapy/Group: Individual Therapy  Jerilyn Gillaspie 10/09/2021, 3:02 PM

## 2021-10-09 NOTE — Progress Notes (Signed)
Occupational Therapy TBI Note  Patient Details  Name: Jeff [name] MRN: [id number] Date of Birth: 01/18/2001  Today's Date: 10/09/2021 OT Individual Time: 1335-1415 OT Individual Time Calculation (min): 40 min  and Today's Date: 10/09/2021 OT Missed Time: 20 Minutes Missed Time Reason: Unavailable (comment) (wound vac placement)   Short Term Goals: Week 1:  OT Short Term Goal 1 (Week 1): Pt will compelte bed<>chair transfer wiht MOD A of 1 in prep for toielting OT Short Term Goal 1 - Progress (Week 1): Met OT Short Term Goal 2 (Week 1): Pt will don shirt wiht MOD A OT Short Term Goal 2 - Progress (Week 1): Met OT Short Term Goal 3 (Week 1): Pt will groom with supervision OT Short Term Goal 3 - Progress (Week 1): Met OT Short Term Goal 4 (Week 1): Pt will sit EOM with MIN A for 5 min during functional task OT Short Term Goal 4 - Progress (Week 1): Met  Skilled Therapeutic Interventions/Progress Updates:     Pt received in w/c with family present for family education. Interpreter present for mother. OT educates on splint wearing, RW use, hip precautions, NWB precautions, standing balance, key points of control, Sit to stand, stand pivot transfers, AE for dressing (demo donning shirt/pants with reacher, sock aide for socks- but pt already dressed), and reinforced improving pt indepence by NOT helping with steps of dressing to allow for improved efficiency with practice. Family with no questions. Mother and sister practice both SPT with RW and squat pivot transfers. Educated on energy conservation and choosing which transfer to do based on location and how tired pt is. Family and pt needed cuing and coaching for hand placement and positioning during transfer. Father has been present for all tx sessions and is knowledgable on recommended strategies/transfer methods. Pt requiring more than reasonable amount of time to complete SPT with RW.  Wound care team arrives and requesting pt  back into bed for wound vac placement. Pt missed 20 min skilled OT. Will attempt to make up at a later date.  Pt left at end of session in bed with exit alarm on, call light in reach and all needs met   Therapy Documentation Precautions:  Precautions Precautions: Posterior Hip, Fall Precaution Booklet Issued: Yes (comment) Precaution Comments: R posterior hip precautions Required Braces or Orthoses: Splint/Cast Knee Immobilizer - Right: Other (comment) Splint/Cast: R thumb spica, LUE has two splint options- Radial Nerve palsy splint 4 hours on, 4 hours off, and a wrist cock up splint at night Restrictions Weight Bearing Restrictions: Yes RUE Weight Bearing: Weight bearing as tolerated LUE Weight Bearing: Weight bearing as tolerated RLE Weight Bearing: Non weight bearing LLE Weight Bearing: Weight bearing as tolerated Other Position/Activity Restrictions: Per Jeff [name] on 09/12/21 okay for weight L UE with transfers and on R UE with splint on L LE WBAT, NWB and hip precautions R LE General:    Agitated Behavior Scale: TBI ABS discontinued d/t ABS score less than 20 for the last three days or no behaviors present       Therapy/Group: Individual Therapy  Jeff [name] 10/09/2021, 6:52 AM

## 2021-10-09 NOTE — Progress Notes (Signed)
Occupational Therapy Session Note  Patient Details  Name: Jeff [name] MRN: [id number] Date of Birth: 07/27/2000  Today's Date: 10/09/2021 OT Individual Time: 2992-4268 OT Individual Time Calculation (min): 32 min    Short Term Goals: Week 3:  OT Short Term Goal 1 (Week 3): Pt will complete 2/3 components of toileting wiht MIN A and no overt LOB OT Short Term Goal 2 (Week 3): Pt will maintain precautions with RLE throughout transitional movements and no more than min cuing OT Short Term Goal 3 (Week 3): Pt will thread BLE into pants wiht no A and AE PRN OT Short Term Goal 4 (Week 3): Pt will recall use of AE for footwear with no more than min cuing  Skilled Therapeutic Interventions/Progress Updates:  Pt awake in bed with father present in room upon OT arrival to the room. 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 Don't tell her I said that!" Pt in agreement for OT session.   Therapy Documentation Precautions:  Precautions Precautions: Posterior Hip, Fall Precaution Booklet Issued: Yes (comment) Precaution Comments: R posterior hip precautions Required Braces or Orthoses: Splint/Cast Knee Immobilizer - Right: Other (comment) Splint/Cast: R thumb spica, LUE has two splint options- Radial Nerve palsy splint 4 hours on, 4 hours off, and a wrist cock up splint at night Restrictions Weight Bearing Restrictions: Yes RUE Weight Bearing: Weight bearing as tolerated LUE Weight Bearing: Weight bearing as tolerated RLE Weight Bearing: Non weight bearing LLE Weight Bearing: Weight bearing as tolerated Other Position/Activity Restrictions: Per [name] on 09/12/21 okay for weight L UE with transfers and on R UE with splint on L LE WBAT, NWB and hip precautions R LE Vital Signs: Please see "Flowsheet" for most recent vitals charted by nursing staff.  Pain: Pain Assessment Pain Scale: 0-10 Pain Score: 0-No pain  ADL: Pt declines need to perform ADLs at  this time. Pt's father provides maximal assistance to don shorts at bed level to ensure posterior hip precautions as father reports this is plan for home after DC as well. Pt able to perform SPT to the L from EOB > w/c with CGA with use of FWW and good adherence to WB precautions.  Therapeutic Activity: Pt participates in therapeutic activity in order to improve standing tolerance and functional reaching with BUE using the BITS. Pt participates in user-paced visual scanning with limited to central quadrant for improved ability to reach. Pt able to perform 2 trials x 2 minutes while standing with FWW alternating between RUE <> LUE for reaching using comp positioning and techniques to target a moving object. Pt able to maintain standing balance with FWW and CGA with good adherence to WB precautions. Pt able to perform user paced visual scanning task with the following results:  -Trial 1: 92.11% accuracy, 1.62 sec reaction time, 35 hits -Trial 2: ~96% accuracy, ~1.25 sec reaction time, ~45 hits  Pt able to perform various sit <> stand transfers from w/c during therapeutic activity with CGA and use of FWW while maintaining WB precautions.   Pt requested to stay in the w/c at end of session. Pt left sitting comfortably in the w/c with personal belongings and call light within reach, father present in room, and comfort needs attended to.   Therapy/Group: Individual Therapy  Lavera Guise 10/09/2021, 11:55 AM

## 2021-10-09 NOTE — Progress Notes (Signed)
Physical Therapy Session Note  Patient Details  Name: Jeff [name] MRN: [id number] Date of Birth: 04/12/2000  Today's Date: 10/09/2021 PT Individual Time: 0938-1829 PT Individual Time Calculation (min): 40 min   Short Term Goals: Week 3:  PT Short Term Goal 1 (Week 3): STG=LTG due to ELOS.  Skilled Therapeutic Interventions/Progress Updates:     Patient in w/c with his father assisting him with dressing and sink level ADLs upon PT arrival. Patient alert and agreeable to PT session. Patient denied pain during session.  Patient completed ADLs with mod A from his father as PT started d/c assessment, see d/c notes for details. Patient propelled w/c >50 feet x2 with mod I and slow cadence. Use breaks with min cues prior to transfers. Doffed/donned leg rests with total A for energy/time management. Patient performed sit to/from stand x3 with RW with supervision and min cues for R foot placement with NWB. Attempted gait trails x3, patient with poor motor planning and effort due to fatigue, despite encouragement, demonstration, and progression from standing heel raises, patient unable to advance his L foot today. Agreeable to try again in later therapy sessions. Patient returned to the room. Verbally reviewed HEP with patient and his father, educated on where to hold to assist patient with exercises. Emphasized that patient needs to do as much of the ROM as he is able to. Patient able to recall 1/3 hip precautions, patient's father able to remind patient of 3/3 hip precautions.   Patient in w/c with his father in the room at end of session with breaks locked and all needs within reach.   Therapy Documentation Precautions:  Precautions Precautions: Posterior Hip, Fall Precaution Booklet Issued: Yes (comment) Precaution Comments: R posterior hip precautions Required Braces or Orthoses: Splint/Cast Knee Immobilizer - Right: Other (comment) Splint/Cast: R thumb spica, LUE has two  splint options- Radial Nerve palsy splint 4 hours on, 4 hours off, and a wrist cock up splint at night Restrictions Weight Bearing Restrictions: Yes RUE Weight Bearing: Weight bearing as tolerated LUE Weight Bearing: Weight bearing as tolerated RLE Weight Bearing: Non weight bearing LLE Weight Bearing: Weight bearing as tolerated Other Position/Activity Restrictions: Per [name] on 09/12/21 okay for weight L UE with transfers and on R UE with splint on L LE WBAT, NWB and hip precautions R LE    Therapy/Group: Individual Therapy  Mcdaniel Ohms L Genine Beckett PT, DPT, NCS, CBIS  10/09/2021, 10:34 AM

## 2021-10-09 NOTE — Progress Notes (Signed)
Patient up resting in bed. Wound Vac applied to right thigh this afternoon. Educated patient and family regarding changing collection contained, monitoring output color and amount.  Who to call if wound vac malfunctions. Patient and sister understand how to manage wound vac. Dressings changed per order. Denies pain or discomfort at this time.

## 2021-10-09 NOTE — Progress Notes (Signed)
Physical Therapy TBI Note  Patient Details  Name: Jeff [name] MRN: [id number] Date of Birth: 20-Dec-2000  Today's Date: 10/09/2021 PT Individual Time: 7998-7215 PT Individual Time Calculation (min): 26 min   Short Term Goals: Week 2:  PT Short Term Goal 1 (Week 2): Patient will perform bed mobility with CGA consistently. PT Short Term Goal 1 - Progress (Week 2): Progressing toward goal PT Short Term Goal 2 (Week 2): Patient will perform basic transfer with CGA using LRAD. PT Short Term Goal 2 - Progress (Week 2): Met PT Short Term Goal 3 (Week 2): Patient will initiate w/c mobility. PT Short Term Goal 3 - Progress (Week 2): Met Week 3:  PT Short Term Goal 1 (Week 3): STG=LTG due to ELOS.   Skilled Therapeutic Interventions/Progress Updates:    Session started late due to wound vac being applied. Schedule was planned for family education. Pt's father has been present for a lot of therapy session and states he feels comfortable with all aspects. Family reports they were able to practice with the OT today and also feel comfortable with all transfers. Offered to practice again to personal wheelchair that was delivered to adjust appropriately but father showed he knows how to adjust and would prefer to do it himself as pt is "tired" right now. Encouraged to trial gait this PM as he was unable to do in earlier PT session (pt did not recall that he was unable) but continued to decline due to being "tired". Reviewed importance of compliance of HEP and continued mobility upon discharge as well as answered questions about follow up therapies. Pt and family denied any further questions or things to practice.   Therapy Documentation Precautions:  Precautions Precautions: Posterior Hip, Fall Precaution Booklet Issued: Yes (comment) Precaution Comments: R posterior hip precautions Required Braces or Orthoses: Splint/Cast Knee Immobilizer - Right: Other (comment) Splint/Cast: R thumb  spica, LUE has two splint options- Radial Nerve palsy splint 4 hours on, 4 hours off, and a wrist cock up splint at night Restrictions Weight Bearing Restrictions: Yes RUE Weight Bearing: Weight bearing as tolerated LUE Weight Bearing: Weight bearing as tolerated RLE Weight Bearing: Non weight bearing LLE Weight Bearing: Weight bearing as tolerated Other Position/Activity Restrictions: Per [name] on 09/12/21 okay for weight L UE with transfers and on R UE with splint on L LE WBAT, NWB and hip precautions R LE   Pain: Pain Assessment Pain Scale: 0-10 Pain Score: 0-No pain  Agitated Behavior Scale: TBI    ABS discontinued d/t ABS score less than 20 for the last three days or no behaviors present   Therapy/Group: Individual Therapy  Canary Brim Ivory Broad, PT, DPT, CBIS  10/09/2021, 3:32 PM

## 2021-10-09 NOTE — Progress Notes (Signed)
[facility]   Dressing changed to R thigh  New vac applied  Will need home vac  New dressing change in 10-14 days at our office  Pt to bring vac sponges and drape with him to the appointment   Overall wound to right thigh looks healthy, healthy granulation tissue which has grown over previous split thickness skin graft Roughly 20 cm x 10 cm, no depth of significance  No undermining of wound   New myriad sheet (10x 20 sheet) applied directly on wound bed. Mepitel applied over mriad then [place] on mepitel followed by vac sponge.  ACE applied to right thigh and abd placed between vac hose and skin  Vac set to 125 mmHg, continuous, medium suction    L thigh dressing can be changed as needed. Ok to leave open to air.  Covered to protect from catching on anything   Jari Pigg, PA-C [phone] (C) 10/09/2021, 2:58 PM  [facility] [place] [phone] (F)       Patient ID: Jeff [name], male   DOB: December 17, 2000, 21 y.o.   MRN: [id number]

## 2021-10-10 ENCOUNTER — Other Ambulatory Visit (HOSPITAL_COMMUNITY): Payer: Self-pay

## 2021-10-10 MED ORDER — ACETAMINOPHEN 325 MG PO TABS: 325.0000 mg | ORAL_TABLET | ORAL | Status: DC | PRN

## 2021-10-10 MED ORDER — PROPRANOLOL HCL 40 MG PO TABS
40.0000 mg | ORAL_TABLET | Freq: Three times a day (TID) | ORAL | 0 refills | Status: DC
  Filled 2021-10-10: qty 90, 30d supply, fill #0

## 2021-10-10 MED ORDER — MELATONIN 5 MG PO TABS
5.0000 mg | ORAL_TABLET | Freq: Every evening | ORAL | 0 refills | Status: DC | PRN
  Filled 2021-10-10: qty 30, 30d supply, fill #0

## 2021-10-10 MED ORDER — CLONIDINE 0.1 MG/24HR TD PTWK
0.1000 mg | MEDICATED_PATCH | TRANSDERMAL | 12 refills | Status: DC
  Filled 2021-10-10: qty 4, 28d supply, fill #0

## 2021-10-10 MED ORDER — VITAMIN D3 25 MCG PO TABS
1000.0000 [IU] | ORAL_TABLET | Freq: Every day | ORAL | 0 refills | Status: DC
  Filled 2021-10-10: qty 30, 30d supply, fill #0

## 2021-10-10 MED ORDER — POLYETHYLENE GLYCOL 3350 17 G PO PACK: 17.0000 g | PACK | Freq: Every day | ORAL | 0 refills | Status: DC

## 2021-10-10 MED ORDER — APIXABAN 5 MG PO TABS
5.0000 mg | ORAL_TABLET | Freq: Two times a day (BID) | ORAL | 0 refills | Status: DC
  Filled 2021-10-10: qty 60, 30d supply, fill #0

## 2021-10-10 MED ORDER — METHYLPHENIDATE HCL 10 MG PO TABS
5.0000 mg | ORAL_TABLET | Freq: Two times a day (BID) | ORAL | 0 refills | Status: DC
  Filled 2021-10-10: qty 30, 30d supply, fill #0

## 2021-10-10 MED ORDER — SERTRALINE HCL 50 MG PO TABS
50.0000 mg | ORAL_TABLET | Freq: Every day | ORAL | 0 refills | Status: DC
  Filled 2021-10-10: qty 30, 30d supply, fill #0

## 2021-10-10 MED ORDER — TRAMADOL HCL 50 MG PO TABS
50.0000 mg | ORAL_TABLET | Freq: Four times a day (QID) | ORAL | 0 refills | Status: DC | PRN
  Filled 2021-10-10: qty 30, 8d supply, fill #0

## 2021-10-10 MED ORDER — ASCORBIC ACID 500 MG PO TABS
500.0000 mg | ORAL_TABLET | Freq: Two times a day (BID) | ORAL | 0 refills | Status: DC
  Filled 2021-10-10: qty 60, 30d supply, fill #0

## 2021-10-10 MED ORDER — GABAPENTIN 600 MG PO TABS
600.0000 mg | ORAL_TABLET | Freq: Three times a day (TID) | ORAL | 0 refills | Status: DC
  Filled 2021-10-10: qty 90, 30d supply, fill #0

## 2021-10-10 NOTE — Progress Notes (Signed)
Inpatient Rehabilitation Discharge Medication Review by a Pharmacist  A complete drug regimen review was completed for this patient to identify any potential clinically significant medication issues.  High Risk Drug Classes Is patient taking? 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    Other       Clinically significant medication issues were identified that warrant physician communication and completion of prescribed/recommended actions by midnight of the next day:  No   Time spent performing this drug regimen review (minutes):  30  Elie Gragert BS, PharmD, BCPS Clinical Pharmacist 10/10/2021 8:12 AM  Contact: [phone] after 3 PM  "Be curious, not judgmental..." -Debbora Dus
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
## 2021-10-10 NOTE — Progress Notes (Signed)
Physical Therapy Session Note  Patient Details  Name: Jeff [name] MRN: [id number] Date of Birth: Jan 01, 2001  Today's Date: 10/10/2021 PT Individual Time: 1401-1500 PT Individual Time Calculation (min): 59 min   Short Term Goals: Week 3:  PT Short Term Goal 1 (Week 3): STG=LTG due to ELOS.  Skilled Therapeutic Interventions/Progress Updates: Pt presents semi-reclined in bed and agreeable to therapy.  Pt performed sup to sit transfer w/ supervision from flat bed, but slow progression.  Pt able to recall 2/3 hip precautions.  Pt transfers squat pivot to w/c w/ supervision and occasional cue for WB to RLE.  Pt wheeled > 300' w/ supervision and able to lock/unlock brakes, but unable to perform leg rest applications.  Pt performed LAQ BLE s and L hip flexion.  Pt performed w/c transfer to car w/ supervision/CGA but does require assist for Les into simulated care.  Pt states use of Minivan which will increase space for bringing LE s into car.  Pt performed sit to stand transfer w/ CGA but pt c/o fatigue and unable to initiate gait.  Pt wheeled x 100' w/ supervision and PT completed return to room.  Pt positioned self for squat pivot transfer w/c > bed and removed arm rest.  Pt returned to bed w/ supervision and sit to supine.  Bed alarm on and father present at conclusion.     Therapy Documentation Precautions:  Precautions Precautions: Posterior Hip, Fall Precaution Booklet Issued: Yes (comment) Precaution Comments: R posterior hip precautions Required Braces or Orthoses: Splint/Cast Knee Immobilizer - Right: Other (comment) Splint/Cast: R thumb spica, LUE has two splint options- Radial Nerve palsy splint 4 hours on, 4 hours off, and a wrist cock up splint at night Restrictions Weight Bearing Restrictions: Yes RUE Weight Bearing: Weight bearing as tolerated LUE Weight Bearing: Weight bearing as tolerated RLE Weight Bearing: Non weight bearing LLE Weight Bearing: Weight  bearing as tolerated Other Position/Activity Restrictions: Per [name] on 09/12/21 okay for weight L UE with transfers and on R UE with splint on L LE WBAT, NWB and hip precautions R LE General:   Vital Signs:  Pain:c/o stiffness/soreness. Pain Assessment Pain Scale: 0-10 Pain Score: 0-No pain Mobility:   Locomotion :    Trunk/Postural Assessment :    Balance: Balance Balance Assessed: Yes Static Sitting Balance Static Sitting - Level of Assistance: 6: Modified independent (Device/Increase time) Dynamic Sitting Balance Dynamic Sitting - Level of Assistance: 5: Stand by assistance Static Standing Balance Static Standing - Balance Support: Bilateral upper extremity supported Static Standing - Level of Assistance: 5: Stand by assistance Exercises:   Other Treatments:      Therapy/Group: Individual Therapy  [name] 10/10/2021, 3:02 PM

## 2021-10-10 NOTE — Progress Notes (Signed)
Speech Language Pathology Discharge Summary  Patient Details  Name: Lillard Bailon MRN: [id number] Date of Birth: 2000/09/07  Date of Discharge from SLP service:October 10, 2021  Today's Date: 10/10/2021 SLP Individual Time: 9449-6759 SLP Individual Time Calculation (min): 30 min   Skilled Therapeutic Interventions:  Skilled treatment session focused on cognitive goals. SLP facilitated session by providing overall Mod A verbal question cues for use of his memory notebook to recall events from previous therapy session. Both the patient and his father reported that they enjoy playing cards/games as a family. SLP introduced the game "Blink" to the patient in which he was able to recall and carryover the procedures to the game with Min question cues. Min verbal cues were also needed for problem solving throughout task. Patient left upright in wheelchair with father present. Continue with current plan of care.   Patient has met 7 of 7 long term goals.  Patient to discharge at overall Mod level.   Reasons goals not met: N/A   Clinical Impression/Discharge Summary: Patient has made functional gains and has met 7 of 7 LTGs this admission. Currently, patient is consuming regular textures with thin liquids without overt s/s of aspiration with Mod I for use of swallowing compensatory strategies. Patient demonstrates behaviors consistent with a Rancho Level VI and requires overall Mod A multimodal cues to complete functional and familiar tasks safely in regards to sustained attention, functional problem solving, emergent awareness, and recall with use of compensatory strategies. Of note, patient's functional recall is currently better than verbal recall. Due to patient's improved cognitive, patient's overall verbal initiation has also improved and patient can express his wants/needs independently and is 100% intelligible at the conversation level. Patient and family education is complete and  patient will discharge home with 24 hour supervision from family. Patient would benefit from f/u SLP services to maximize his cognitive functioning and overall functional independence in order to reduce caregiver burden.   Care Partner:  Caregiver Able to Provide Assistance: Yes  Type of Caregiver Assistance: Physical;Cognitive  Recommendation:  24 hour supervision/assistance;Outpatient SLP  Rationale for SLP Follow Up: Reduce caregiver burden;Maximize cognitive function and independence   Equipment: N/A   Reasons for discharge: Discharged from hospital;Treatment goals met   Patient/Family Agrees with Progress Made and Goals Achieved: Yes    Chenita Ruda, [place] 10/10/2021, 6:37 AM

## 2021-10-10 NOTE — Progress Notes (Signed)
Occupational Therapy Discharge Summary  Patient Details  Name: Jeff [name] MRN: [id number] Date of Birth: [birth year]   Today's Date: 10/10/2021 OT Individual Time:  -      Pt received in bed with no pain reported. W/c propulsion for  improved activity tolerance with supervision. ADL needs met with father this morning.  Therapeutic activity Pt completes kitchen search into various height cabinets/appliaces in prep for IADL retraining from w/c level with mod cuing for safety/positioning throughout environment. Pt uses reacher AE + radial nerve splint to improve reach/safety and transportation of items with education on energy conservation techniques throughout activity  BITS seated with drumstick to improve reach: immediate recall memory up to 5 with errors at 6 on first trial and then 8 at first trial  Visual memory with geo boards up to level 5 Visual pursuit with central fixation 1-20 moderate intensity central fixation changes with 83% accuracy on level 7 speed with 3.4 sec reactions Trail-making B test with 3 errors and 2 min 47 seconds often forgetting number before letter for all errors.   Discussed video game adaptations and adaptive controllers and [email or web address] to improve acces to leisure activities. Wrote all info in [employer].   Pt left at end of session in w/c with exit alarm on, call light in reach and all needs met   Today's Date: 10/10/2021 OT Individual Time:  -      Pt received in w/c reporting fatigue but agreeable to OT with encouragement. Therapeutic activity W/c propulsion with set up to dayroom for BUE endurance.  Seated connect 4 reaching in all planes with Saebow MAS iused with assistance 3.5 to facilatie reaching and eliminate gravity.pt able to use strategy to win 1/3 games  Pt completes bean bag toss in seated position with saebo MAS used for gross motor control overall. Activity performed to improve functional reach in mod ranges  outside BOS in prep for BADLs/IADLs.  Pt left at end of session in w/c with dad present wound vac plugged in and all needs met.   Date of Discharge from OT service:October 10, 2021  Patient has met 10 of 11 long term goals due to improved activity tolerance, improved balance, postural control, ability to compensate for deficits, functional use of  RIGHT upper and LEFT upper extremity, improved attention, improved awareness, and improved coordination.  Patient to discharge at [facility] Assist level.  Patient's care partner is independent to provide the necessary physical and cognitive assistance at discharge.  Pts father has been present for all sessions and family present for education on transfers as well as strategies for dressing. Reinforced pt should be let extra time to problem solve and be coached through how to dress v just doing it for home.   Reasons goals not met: Pt still max A for memory  Recommendation:  Patient will benefit from ongoing skilled OT services in outpatient setting to continue to advance functional skills in the area of BADL and Reduce care partner burden.  Equipment: BSC; shower seat  Reasons for discharge: treatment goals met and discharge from hospital  Patient/family agrees with progress made and goals achieved: Yes  OT Discharge Precautions/Restrictions  Precautions Precautions: Posterior Hip;Fall Precaution Booklet Issued: Yes (comment) Precaution Comments: R posterior hip precautions Required Braces or Orthoses: Splint/Cast Knee Immobilizer - Right: Other (comment) Splint/Cast: R thumb spica, LUE has two splint options- Radial Nerve palsy splint on during day, and a wrist cock up splint at night Restrictions Weight Bearing Restrictions: Yes  RUE Weight Bearing: Weight bearing as tolerated LUE Weight Bearing: Weight bearing as tolerated RLE Weight Bearing: Non weight bearing LLE Weight Bearing: Weight bearing as tolerated Other Position/Activity  Restrictions: Per [name] on 09/12/21 okay for weight L UE with transfers and on R UE with splint on L LE WBAT, NWB and hip precautions R LE General Chart Reviewed: Yes PT Missed Treatment Reason: Patient fatigue Family/Caregiver Present: Yes Vital Signs  Pain Pain Assessment Pain Scale: 0-10 Pain Score: 0-No pain ADL ADL Eating: Set up Where Assessed-Eating: Wheelchair Grooming: Supervision/safety Where Assessed-Grooming: Sitting at sink Upper Body Bathing: Supervision/safety Where Assessed-Upper Body Bathing: Sitting at sink (LHSS) Lower Body Bathing: Contact guard Where Assessed-Lower Body Bathing: Wheelchair (LHSS) Upper Body Dressing: Supervision/safety Where Assessed-Upper Body Dressing: Wheelchair Lower Body Dressing: Minimal assistance Where Assessed-Lower Body Dressing: Edge of bed Toileting: Contact guard Where Assessed-Toileting: Bedside Commode Toilet Transfer: Close supervision Toilet Transfer Method: Squat pivot Vision Baseline Vision/History: 0 No visual deficits Vision Assessment?: No apparent visual deficits Perception  Perception: Within Functional Limits Praxis Praxis Impairment Details: Initiation;Motor planning Cognition Cognition Overall Cognitive Status: Impaired/Different from baseline Arousal/Alertness: Awake/alert Memory: Impaired Memory Impairment: Storage deficit;Retrieval deficit;Decreased recall of new information;Decreased short term memory Decreased Short Term Memory: Verbal basic;Functional basic Attention: Focused;Sustained Focused Attention: Appears intact Focused Attention Impairment: Verbal basic Sustained Attention: Impaired Sustained Attention Impairment: Verbal basic;Functional basic Awareness: Impaired Awareness Impairment: Emergent impairment Problem Solving: Impaired Problem Solving Impairment: Verbal basic Safety/Judgment: Impaired Rancho Los Amigos Scales of Cognitive Functioning: Automatic, Appropriate Brief  Interview for Mental Status (BIMS) Repetition of Three Words (First Attempt): 3 Temporal Orientation: Year: Correct Temporal Orientation: Month: Accurate within 5 days Temporal Orientation: Day: Correct Recall: "Sock": No, could not recall Recall: "Blue": No, could not recall Recall: "Bed": No, could not recall BIMS Summary Score: 9 Sensation Sensation Light Touch: Impaired Detail Light Touch Impaired Details: Impaired RUE;Impaired RLE Hot/Cold: Appears Intact Proprioception: Impaired by gross assessment Stereognosis: Not tested Additional Comments: Decreased sensation along peroneal nerve distribution of R LE, decreased sensation of R distal fingers Coordination Gross Motor Movements are Fluid and Coordinated: No Fine Motor Movements are Fluid and Coordinated: No Coordination and Movement Description: Poly truama with neuromuscular injury and NWB R LE limiting gross and fine motor coordination Finger Nose Finger Test: able to complete after increased time and demonstration with LUE- mild discoordination noted Motor  Motor Motor: Abnormal postural alignment and control;Abnormal tone Motor - Discharge Observations: Slow and deliberate mobility due to neuromuscular impairments and reduce carry-over of motor skills due to cognitive deficits Mobility  Bed Mobility Bed Mobility: Rolling Right;Rolling Left;Sit to Supine;Supine to Sit Rolling Right: Independent Rolling Left: Independent Supine to Sit: Supervision/Verbal cueing Sit to Supine: Supervision/Verbal cueing Transfers Sit to Stand: Supervision/Verbal cueing Stand to Sit: Supervision/Verbal cueing  Trunk/Postural Assessment  Cervical Assessment Cervical Assessment:  (forward head) Thoracic Assessment Thoracic Assessment:  (rounded shoulders) Lumbar Assessment Lumbar Assessment:  (posteiror pelvic tilt) Postural Control Postural Control:  (delayed/insuffient)  Balance Balance Balance Assessed: Yes Static Sitting  Balance Static Sitting - Level of Assistance: 7: Independent Dynamic Sitting Balance Dynamic Sitting - Level of Assistance: 6: Modified independent (Device/Increase time) Static Standing Balance Static Standing - Balance Support: Bilateral upper extremity supported Static Standing - Level of Assistance: 5: Stand by assistance Extremity/Trunk Assessment RUE Assessment RUE Assessment: Exceptions to [facility] General Strength Comments: brachial plexus stretch injury proximal weaker than distally: shoulder 2+/5; elbow/wrist/hand 3+/5, LUE Assessment LUE Assessment: Exceptions to [facility] General Strength Comments: wrist extension  present, however no dogit extension- radial nerve palsy splint   [name] 10/10/2021, 3:55 PM

## 2021-10-10 NOTE — Progress Notes (Addendum)
Patients father educated on current wound vac. Home wound vac NA at this time. Care coordinator  notified that home wound vac is still NA.  Care coordinator to follow up   1845 Patients mother at bedside. Patients mother speaks [place]. Unable to educate family on Wound Vac that patients is being  DC home with. Father will be back in the evening. Will endorse Wound Vac education to on coming [place].

## 2021-10-10 NOTE — Progress Notes (Signed)
Inpatient Rehabilitation Care Coordinator Discharge Note   Patient Details  Name: Jeff [name] MRN: [id number] Date of Birth: Jan 06, 2001   Discharge location: D/c to home  Length of Stay: 20 days  Discharge activity level: supervision at w/c level  Home/community participation: Limited  Patient response LE:XNTZGY Literacy - How often do you need to have someone help you when you read instructions, pamphlets, or other written material from your doctor or pharmacy?: Never  Patient response FV:CBSWHQ Isolation - How often do you feel lonely or isolated from those around you?: Never  Services provided included: MD, RD, PT, OT, SLP, CM, Neuropsych, SW, Pharmacy, TR, Industrial/product designer Services:  Field seismologist Utilized: Private Insurance [place] Medicaid [employer]  Choices offered to/list presented to: yes  Follow-up services arranged:  Outpatient, DME    Outpatient Servicies: [facility] for outpatient PT/OT/SLP DME : [facility] for [facility]/c, RW, 3in1 BSC, and shower chair with back    Patient response to transportation need: Is the patient able to respond to transportation needs?: Yes In the past 12 months, has lack of transportation kept you from medical appointments or from getting medications?: No In the past 12 months, has lack of transportation kept you from meetings, work, or from getting things needed for daily living?: No   Comments (or additional information):  Patient/Family verbalized understanding of follow-up arrangements:  Yes  Individual responsible for coordination of the follow-up plan: Contact pt father [place] (spanish interpreter)  Confirmed correct DME delivered: Gretchen Short 10/10/2021    Gretchen Short

## 2021-10-10 NOTE — Progress Notes (Signed)
Patient ID: Jeff [name], male   DOB: [birth year], 21 y.o.   MRN: [id number]  SW waiting on wound vac to be released by insurance.   1522-SW left message for Tracy[facility] to discuss wound vac status.  1528- SW spoke with [place] reporting they are exploring the reason insurance has not released item yet. SW shared no SW on unit over the weekend, and it would have to be passed to nursing. SW shared preferred if can be addressed today.  Efforts will be made for this to be released today. SW waiting on follow-up.   1504-wound vac released. SW provided wound vac to patient and plugged up in room.   Pt ready for d/c tomorrow.   Cecile Sheerer, MSW, LCSWA Office: [phone] Cell: [phone] Fax: [phone]

## 2021-10-10 NOTE — Plan of Care (Signed)
  Problem: RH Swallowing Goal: LTG Patient will consume least restrictive diet using compensatory strategies with assistance (SLP) Description: LTG:  Patient will consume least restrictive diet using compensatory strategies with assistance (SLP) Outcome: Completed/Met   Problem: RH Cognition - SLP Goal: RH LTG Patient will demonstrate orientation with cues Description:  LTG:  Patient will demonstrate orientation to person/place/time/situation with cues (SLP)   Outcome: Completed/Met   Problem: RH Comprehension Communication Goal: LTG Patient will comprehend basic/complex auditory (SLP) Description: LTG: Patient will comprehend basic/complex auditory information with cues (SLP). Outcome: Completed/Met   Problem: RH Expression Communication Goal: LTG Patient will verbally express basic/complex needs(SLP) Description: LTG:  Patient will verbally express basic/complex needs, wants or ideas with cues  (SLP) Outcome: Completed/Met   Problem: RH Problem Solving Goal: LTG Patient will demonstrate problem solving for (SLP) Description: LTG:  Patient will demonstrate problem solving for basic/complex daily situations with cues  (SLP) Outcome: Completed/Met   Problem: RH Memory Goal: LTG Patient will use memory compensatory aids to (SLP) Description: LTG:  Patient will use memory compensatory aids to recall biographical/new, daily complex information with cues (SLP) Outcome: Completed/Met   Problem: RH Attention Goal: LTG Patient will demonstrate this level of attention during functional activites (SLP) Description: LTG:  Patient will will demonstrate this level of attention during functional activites (SLP) Outcome: Completed/Met

## 2021-10-11 DIAGNOSIS — S069XAS Unspecified intracranial injury with loss of consciousness status unknown, sequela: Secondary | ICD-10-CM

## 2021-10-11 DIAGNOSIS — S069XAD Unspecified intracranial injury with loss of consciousness status unknown, subsequent encounter: Secondary | ICD-10-CM
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
## 2021-10-12 NOTE — Plan of Care (Signed)
Pt discharged

## 2021-10-22 ENCOUNTER — Encounter: Payer: Self-pay | Admitting: Physical Medicine & Rehabilitation

## 2021-10-22 ENCOUNTER — Encounter: Payer: Medicaid Other | Attending: Physical Medicine & Rehabilitation | Admitting: Physical Medicine & Rehabilitation

## 2021-10-22 ENCOUNTER — Other Ambulatory Visit (HOSPITAL_COMMUNITY): Payer: Self-pay

## 2021-10-22 VITALS — BP 103/68 | HR 91 | Ht 67.0 in

## 2021-10-22 DIAGNOSIS — S069X0D Unspecified intracranial injury without loss of consciousness, subsequent encounter: Secondary | ICD-10-CM | POA: Diagnosis not present

## 2021-10-22 DIAGNOSIS — X58XXXD Exposure to other specified factors, subsequent encounter: Secondary | ICD-10-CM | POA: Insufficient documentation

## 2021-10-22 DIAGNOSIS — F068 Other specified mental disorders due to known physiological condition: Secondary | ICD-10-CM | POA: Diagnosis not present

## 2021-10-22 DIAGNOSIS — S069XAD Unspecified intracranial injury with loss of consciousness status unknown, subsequent encounter: Secondary | ICD-10-CM | POA: Insufficient documentation

## 2021-10-22 DIAGNOSIS — S069X0S Unspecified intracranial injury without loss of consciousness, sequela: Secondary | ICD-10-CM | POA: Insufficient documentation

## 2021-10-22 DIAGNOSIS — S069XAS Unspecified intracranial injury with loss of consciousness status unknown, sequela: Secondary | ICD-10-CM | POA: Diagnosis present

## 2021-10-22 DIAGNOSIS — S46001D Unspecified injury of muscle(s) and tendon(s) of the rotator cuff of right shoulder, subsequent encounter: Secondary | ICD-10-CM | POA: Diagnosis not present

## 2021-10-22 DIAGNOSIS — S46001S Unspecified injury of muscle(s) and tendon(s) of the rotator cuff of right shoulder, sequela: Secondary | ICD-10-CM | POA: Diagnosis not present

## 2021-10-22 DIAGNOSIS — S46001A Unspecified injury of muscle(s) and tendon(s) of the rotator cuff of right shoulder, initial encounter: Secondary | ICD-10-CM | POA: Insufficient documentation

## 2021-10-22 MED ORDER — METHYLPHENIDATE HCL 10 MG PO TABS: 15.0000 mg | ORAL_TABLET | Freq: Two times a day (BID) | ORAL | 0 refills | Status: DC

## 2021-10-22 MED ORDER — GABAPENTIN 600 MG PO TABS: 600.0000 mg | ORAL_TABLET | Freq: Three times a day (TID) | ORAL | 0 refills | Status: DC
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
## 2021-10-22 NOTE — Patient Instructions (Signed)
I WANT YOU TO START KEEPING A MEMORY BOOK EACH DAY.   I WANT YOU TO KEEP A CALENDAR  I WANT TO DO SOME THINGS OUTSIDE YOUR BEDROOM. YOU CAN ONLY GET STRONGER IF YOU PUSH YOUR BRAIN OR MIND .

## 2021-10-27 ENCOUNTER — Telehealth: Payer: Self-pay

## 2021-10-27 DIAGNOSIS — S069XAS Unspecified intracranial injury with loss of consciousness status unknown, sequela: Secondary | ICD-10-CM

## 2021-10-27 NOTE — Telephone Encounter (Signed)
[facility] did not have Methylphenidate in stock. Please send to [facility]

## 2021-10-28 MED ORDER — METHYLPHENIDATE HCL 10 MG PO TABS: 15.0000 mg | ORAL_TABLET | Freq: Two times a day (BID) | ORAL | 0 refills | Status: DC

## 2021-10-28 NOTE — Addendum Note (Signed)
Addended by: Alger Simons T on: 10/28/2021 09:00 AM   Modules accepted: Orders

## 2021-10-28 NOTE — Telephone Encounter (Signed)
Rx written and sent to the pharmacy. Thanks!  

## 2021-10-30 ENCOUNTER — Ambulatory Visit: Payer: Medicaid Other

## 2021-10-30 ENCOUNTER — Ambulatory Visit: Payer: Medicaid Other | Attending: Physical Medicine and Rehabilitation

## 2021-10-30 DIAGNOSIS — M25611 Stiffness of right shoulder, not elsewhere classified: Secondary | ICD-10-CM | POA: Insufficient documentation

## 2021-10-30 DIAGNOSIS — F068 Other specified mental disorders due to known physiological condition: Secondary | ICD-10-CM | POA: Diagnosis present

## 2021-10-30 DIAGNOSIS — S069X0S Unspecified intracranial injury without loss of consciousness, sequela: Secondary | ICD-10-CM | POA: Diagnosis present

## 2021-10-30 DIAGNOSIS — M25612 Stiffness of left shoulder, not elsewhere classified: Secondary | ICD-10-CM | POA: Insufficient documentation

## 2021-10-30 DIAGNOSIS — R278 Other lack of coordination: Secondary | ICD-10-CM | POA: Diagnosis present

## 2021-10-30 DIAGNOSIS — R262 Difficulty in walking, not elsewhere classified: Secondary | ICD-10-CM | POA: Diagnosis present

## 2021-10-30 DIAGNOSIS — R41841 Cognitive communication deficit: Secondary | ICD-10-CM | POA: Insufficient documentation

## 2021-10-30 DIAGNOSIS — M6281 Muscle weakness (generalized): Secondary | ICD-10-CM | POA: Diagnosis not present

## 2021-10-30 DIAGNOSIS — R41842 Visuospatial deficit: Secondary | ICD-10-CM | POA: Diagnosis present
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
## 2021-10-30 NOTE — Addendum Note (Signed)
Addended byVenita Lick, Johney Perotti on: 10/30/2021 02:41 PM   Modules accepted: Orders
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
## 2021-11-03 ENCOUNTER — Other Ambulatory Visit: Payer: Self-pay

## 2021-11-03 ENCOUNTER — Ambulatory Visit: Payer: Medicaid Other | Admitting: Speech Pathology

## 2021-11-03 ENCOUNTER — Telehealth: Payer: Self-pay

## 2021-11-03 ENCOUNTER — Encounter: Payer: Self-pay | Admitting: Speech Pathology

## 2021-11-03 DIAGNOSIS — R41841 Cognitive communication deficit: Secondary | ICD-10-CM

## 2021-11-03 DIAGNOSIS — S46001S Unspecified injury of muscle(s) and tendon(s) of the rotator cuff of right shoulder, sequela: Secondary | ICD-10-CM

## 2021-11-03 DIAGNOSIS — M6281 Muscle weakness (generalized): Secondary | ICD-10-CM | POA: Diagnosis not present
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
## 2021-11-03 NOTE — Telephone Encounter (Signed)
Family requesting refill from Dr. Naaman Plummer. Please advise or fill. Thank you.

## 2021-11-03 NOTE — Telephone Encounter (Signed)
Nehemias has a MRI order at [facility]. [facility] is a no lift facility. The order will have to done at the hospital where they have equipment to lift the patient.

## 2021-11-03 NOTE — Patient Instructions (Signed)
   He  can add on the calendar his appointments - keep is simple with limited words  For example: Therapy 11-2  Let Marzell cross off the days or put a calendar in his memory notebook with this appointments if this is easier for him to cross off  Bring in [employer] - great job using it to help to recall answers to questions you are asking over and over    [place] saw puzzles Easy cross words Memory match - with a deck of cards Board games [employer] [place] a new game!  Talk Path App - free for iPad - do the cognitive activities - you may have to adjust levels  Listen to and discuss Ted Talks or Podcasts Read and discuss short articles of interest to you- Take notes on these if memory is a challenge Discuss social media posts Look and discuss photo albums  The best activities to improve cognition are functional, real life activities that are important to you:  Plan a menu Participate in household chores and decisions (with supervision) Participate in [place] a party, trip or tailgate with all of the details (even if you aren't really going to carry it out) Participate in your hobby as you are able with assistance Manage your texts, emails with supervision if needed. Google search for items (even if you're not really going to buy anything) and compare prices and features Socialize -  however, too many visitors can be overwhelming, so set limits "My doctor said I should only visit (or talk) for 20 minutes" or "I do better when I visit with just 1-2 people at a time for 20 minutes"    It's good to use real in-person games, not just apps  Apps:  [employer] Elevate There are apps for most of the games listed above
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
## 2021-11-04 MED ORDER — VITAMIN D3 25 MCG PO TABS: 1000.0000 [IU] | ORAL_TABLET | Freq: Every day | ORAL | 4 refills | Status: AC

## 2021-11-04 MED ORDER — SERTRALINE HCL 50 MG PO TABS: 50.0000 mg | ORAL_TABLET | Freq: Every day | ORAL | 4 refills | Status: DC

## 2021-11-04 MED ORDER — CLONIDINE 0.1 MG/24HR TD PTWK: 0.1000 mg | MEDICATED_PATCH | TRANSDERMAL | 12 refills | Status: DC

## 2021-11-04 MED ORDER — APIXABAN 5 MG PO TABS: 5.0000 mg | ORAL_TABLET | Freq: Two times a day (BID) | ORAL | 4 refills | Status: DC

## 2021-11-04 MED ORDER — ASCORBIC ACID 500 MG PO TABS: 500.0000 mg | ORAL_TABLET | Freq: Every day | ORAL | 4 refills | Status: AC

## 2021-11-04 MED ORDER — PROPRANOLOL HCL 40 MG PO TABS: 40.0000 mg | ORAL_TABLET | Freq: Three times a day (TID) | ORAL | 4 refills | Status: DC

## 2021-11-04 NOTE — Telephone Encounter (Signed)
MRI Order changed to [place] received med refill requests for him. Meloxicam was listed as a medication he is taking. I did not prescribe this. However, he should not be taking that along with eliquis. Please let him/dad know.   Thanks!

## 2021-11-04 NOTE — Telephone Encounter (Signed)
Family notified of change in order. Will call to get scheduled.
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
## 2021-11-04 NOTE — Addendum Note (Signed)
Addended by: Alger Simons T on: 11/04/2021 09:14 AM   Modules accepted: Orders

## 2021-11-05 ENCOUNTER — Ambulatory Visit: Payer: Medicaid Other | Admitting: Speech Pathology

## 2021-11-05 DIAGNOSIS — R41841 Cognitive communication deficit: Secondary | ICD-10-CM

## 2021-11-05 DIAGNOSIS — S069X0S Unspecified intracranial injury without loss of consciousness, sequela: Secondary | ICD-10-CM

## 2021-11-05 DIAGNOSIS — M6281 Muscle weakness (generalized): Secondary | ICD-10-CM | POA: Diagnosis not present
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
## 2021-11-05 NOTE — Patient Instructions (Signed)
My ultimate goal:  Barriers & action steps to overcome:            2.           3.           4.           5.          6.

## 2021-11-10 ENCOUNTER — Encounter: Payer: Self-pay | Admitting: Speech Pathology

## 2021-11-10 ENCOUNTER — Ambulatory Visit: Payer: Medicaid Other | Admitting: Occupational Therapy

## 2021-11-10 ENCOUNTER — Ambulatory Visit: Payer: Medicaid Other | Admitting: Speech Pathology

## 2021-11-10 DIAGNOSIS — M25611 Stiffness of right shoulder, not elsewhere classified: Secondary | ICD-10-CM

## 2021-11-10 DIAGNOSIS — R41841 Cognitive communication deficit: Secondary | ICD-10-CM

## 2021-11-10 DIAGNOSIS — R41842 Visuospatial deficit: Secondary | ICD-10-CM

## 2021-11-10 DIAGNOSIS — R278 Other lack of coordination: Secondary | ICD-10-CM

## 2021-11-10 DIAGNOSIS — M6281 Muscle weakness (generalized): Secondary | ICD-10-CM

## 2021-11-10 DIAGNOSIS — M25612 Stiffness of left shoulder, not elsewhere classified: Secondary | ICD-10-CM

## 2021-11-10 DIAGNOSIS — F068 Other specified mental disorders due to known physiological condition: Secondary | ICD-10-CM

## 2021-11-10 NOTE — Patient Instructions (Addendum)
   When you are leaving, ask Jeff [name] what he needs to bring to therapy as a cue to remember is notebook.  Help fold laundry since you are near the laundry room  Try to get out - use log to keep track of how much you are getting out  Try to get outside some as well - if you do put that on the log
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
## 2021-11-12 ENCOUNTER — Encounter: Payer: Medicaid Other | Admitting: Speech Pathology

## 2021-11-16 ENCOUNTER — Ambulatory Visit (HOSPITAL_COMMUNITY): Payer: Medicaid Other

## 2021-11-17 ENCOUNTER — Ambulatory Visit: Payer: Medicaid Other | Admitting: Speech Pathology

## 2021-11-17 ENCOUNTER — Encounter: Payer: Self-pay | Admitting: Speech Pathology

## 2021-11-17 DIAGNOSIS — M6281 Muscle weakness (generalized): Secondary | ICD-10-CM | POA: Diagnosis not present

## 2021-11-17 DIAGNOSIS — R41841 Cognitive communication deficit: Secondary | ICD-10-CM
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
## 2021-11-19 ENCOUNTER — Ambulatory Visit: Payer: Medicaid Other | Admitting: Speech Pathology

## 2021-11-19 DIAGNOSIS — M6281 Muscle weakness (generalized): Secondary | ICD-10-CM | POA: Diagnosis not present

## 2021-11-19 DIAGNOSIS — R41841 Cognitive communication deficit: Secondary | ICD-10-CM
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
## 2021-11-24 ENCOUNTER — Other Ambulatory Visit: Payer: Self-pay

## 2021-11-24 DIAGNOSIS — S069XAS Unspecified intracranial injury with loss of consciousness status unknown, sequela: Secondary | ICD-10-CM

## 2021-11-24 MED ORDER — METHYLPHENIDATE HCL 10 MG PO TABS: 15.0000 mg | ORAL_TABLET | Freq: Two times a day (BID) | ORAL | 0 refills | Status: DC

## 2021-11-24 NOTE — Telephone Encounter (Signed)
Dr Philomena Course note was reviewed.  PMP was Reviewed.  Ritalin e-scribed today.

## 2021-11-25 ENCOUNTER — Ambulatory Visit: Payer: Medicaid Other

## 2021-11-25 DIAGNOSIS — M6281 Muscle weakness (generalized): Secondary | ICD-10-CM | POA: Diagnosis not present

## 2021-11-25 DIAGNOSIS — R41841 Cognitive communication deficit: Secondary | ICD-10-CM
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
## 2021-11-27 ENCOUNTER — Ambulatory Visit: Payer: Medicaid Other | Attending: Physical Medicine and Rehabilitation | Admitting: Speech Pathology

## 2021-11-27 DIAGNOSIS — S069X0S Unspecified intracranial injury without loss of consciousness, sequela: Secondary | ICD-10-CM | POA: Insufficient documentation

## 2021-11-27 DIAGNOSIS — M6281 Muscle weakness (generalized): Secondary | ICD-10-CM | POA: Insufficient documentation

## 2021-11-27 DIAGNOSIS — R278 Other lack of coordination: Secondary | ICD-10-CM | POA: Diagnosis present

## 2021-11-27 DIAGNOSIS — M25611 Stiffness of right shoulder, not elsewhere classified: Secondary | ICD-10-CM | POA: Insufficient documentation

## 2021-11-27 DIAGNOSIS — M25612 Stiffness of left shoulder, not elsewhere classified: Secondary | ICD-10-CM | POA: Diagnosis present

## 2021-11-27 DIAGNOSIS — S069XAA Unspecified intracranial injury with loss of consciousness status unknown, initial encounter: Secondary | ICD-10-CM | POA: Diagnosis present

## 2021-11-27 DIAGNOSIS — R41841 Cognitive communication deficit: Secondary | ICD-10-CM | POA: Diagnosis present

## 2021-11-27 DIAGNOSIS — F068 Other specified mental disorders due to known physiological condition: Secondary | ICD-10-CM | POA: Insufficient documentation

## 2021-11-27 DIAGNOSIS — R262 Difficulty in walking, not elsewhere classified: Secondary | ICD-10-CM | POA: Insufficient documentation

## 2021-11-27 DIAGNOSIS — R41842 Visuospatial deficit: Secondary | ICD-10-CM | POA: Diagnosis present
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
## 2021-11-28 ENCOUNTER — Telehealth: Payer: Self-pay | Admitting: Physical Medicine & Rehabilitation

## 2021-11-28 NOTE — Telephone Encounter (Signed)
Patient needs refill for methylphenidate

## 2021-11-29 ENCOUNTER — Ambulatory Visit (HOSPITAL_COMMUNITY): Payer: Medicaid Other

## 2021-12-01 ENCOUNTER — Other Ambulatory Visit (HOSPITAL_COMMUNITY): Payer: Self-pay

## 2021-12-02 ENCOUNTER — Ambulatory Visit: Payer: Medicaid Other | Admitting: Occupational Therapy

## 2021-12-02 ENCOUNTER — Ambulatory Visit: Payer: Medicaid Other

## 2021-12-02 DIAGNOSIS — R41841 Cognitive communication deficit: Secondary | ICD-10-CM
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
## 2021-12-04 ENCOUNTER — Ambulatory Visit: Payer: Medicaid Other | Admitting: Speech Pathology

## 2021-12-04 ENCOUNTER — Encounter: Payer: Self-pay | Admitting: Occupational Therapy

## 2021-12-04 ENCOUNTER — Ambulatory Visit: Payer: Medicaid Other | Admitting: Occupational Therapy

## 2021-12-04 DIAGNOSIS — R41841 Cognitive communication deficit: Secondary | ICD-10-CM | POA: Diagnosis not present

## 2021-12-04 DIAGNOSIS — R262 Difficulty in walking, not elsewhere classified: Secondary | ICD-10-CM

## 2021-12-04 DIAGNOSIS — M25612 Stiffness of left shoulder, not elsewhere classified: Secondary | ICD-10-CM

## 2021-12-04 DIAGNOSIS — M25611 Stiffness of right shoulder, not elsewhere classified: Secondary | ICD-10-CM

## 2021-12-04 DIAGNOSIS — F068 Other specified mental disorders due to known physiological condition: Secondary | ICD-10-CM

## 2021-12-04 DIAGNOSIS — M6281 Muscle weakness (generalized): Secondary | ICD-10-CM

## 2021-12-04 DIAGNOSIS — S069XAA Unspecified intracranial injury with loss of consciousness status unknown, initial encounter: Secondary | ICD-10-CM

## 2021-12-04 DIAGNOSIS — R41842 Visuospatial deficit: Secondary | ICD-10-CM

## 2021-12-04 DIAGNOSIS — R278 Other lack of coordination: Secondary | ICD-10-CM

## 2021-12-04 NOTE — Patient Instructions (Addendum)
Putty Exercises All exercises to be completed TWICE daily Comments **Putty has a tendency to spread out to fit its environment. Please keep putty contained when not in use, out of the heat, out of reach of children and animals, and away from clothing/fabric as it may stick.** SEARCH Locate and pinch each individual item out of the putty. These can be COINS, BUTTONS, MARBLES, ETC. By holding putty in opposite hand, use affected hand to search and remove items. You can also do this with putty on table (as pictured). Repeat until all items have been removed from the putty.   USE:   1-2 items for each hand    GRIP Hold the putty in the palm of your hand. Now grip the putty bending your fingers into the putty as far as you can until they quit moving. REPEAT:  10 times HOLD: until fingers quit moving or about 5 seconds    PINCH Roll up some putty to create a small tubular section. Next, pinch the putty and repeat down the section.   i.e. thumb to index finger, thumb to middle finger, thumb to ring finger, and thumb to pinky finger.        REPEAT:  5 times each finger HOLD:  at least 3 seconds

## 2021-12-04 NOTE — Patient Instructions (Signed)
We are discharging from [facility] today, all your future ST visits are cancelled (see your new calendar)  We are stopping ST because you're current needs are being met based on your limited activities.   We discussed today how you have trouble remembering things day to day but:  We've implemented the [employer] aren't doing meaningful things that you need to remember  You WERE able to recall details of church broadcast you watched yesterday-- probably because this was meaningful and important to you! This demonstrates you do have memory capabilities.   You may need additional training for your thinking skills once you increase your activities and return to doing more things.   If you need help with your thinking skills in the future, ask your physician for a new referral.
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
## 2021-12-08 ENCOUNTER — Telehealth: Payer: Self-pay

## 2021-12-08 DIAGNOSIS — S069XAS Unspecified intracranial injury with loss of consciousness status unknown, sequela: Secondary | ICD-10-CM

## 2021-12-08 MED ORDER — GABAPENTIN 600 MG PO TABS: 600.0000 mg | ORAL_TABLET | Freq: Three times a day (TID) | ORAL | 0 refills | Status: DC

## 2021-12-09 ENCOUNTER — Ambulatory Visit: Payer: Medicaid Other | Admitting: Speech Pathology

## 2021-12-09 MED ORDER — GABAPENTIN 600 MG PO TABS: 600.0000 mg | ORAL_TABLET | Freq: Three times a day (TID) | ORAL | 6 refills | Status: DC

## 2021-12-09 NOTE — Telephone Encounter (Signed)
Rx written and sent to the pharmacy. Thanks!  

## 2021-12-10 ENCOUNTER — Encounter: Payer: Self-pay | Admitting: Physical Medicine & Rehabilitation

## 2021-12-10 ENCOUNTER — Encounter: Payer: Medicaid Other | Attending: Physical Medicine & Rehabilitation | Admitting: Physical Medicine & Rehabilitation

## 2021-12-10 VITALS — BP 109/77 | HR 101

## 2021-12-10 DIAGNOSIS — S46001D Unspecified injury of muscle(s) and tendon(s) of the rotator cuff of right shoulder, subsequent encounter: Secondary | ICD-10-CM | POA: Diagnosis not present

## 2021-12-10 DIAGNOSIS — R41841 Cognitive communication deficit: Secondary | ICD-10-CM | POA: Diagnosis not present

## 2021-12-10 DIAGNOSIS — S069XAS Unspecified intracranial injury with loss of consciousness status unknown, sequela: Secondary | ICD-10-CM | POA: Insufficient documentation

## 2021-12-10 DIAGNOSIS — S065XAD Traumatic subdural hemorrhage with loss of consciousness status unknown, subsequent encounter: Secondary | ICD-10-CM | POA: Diagnosis not present

## 2021-12-10 DIAGNOSIS — F068 Other specified mental disorders due to known physiological condition: Secondary | ICD-10-CM | POA: Insufficient documentation

## 2021-12-10 DIAGNOSIS — S069X0S Unspecified intracranial injury without loss of consciousness, sequela: Secondary | ICD-10-CM | POA: Insufficient documentation

## 2021-12-10 DIAGNOSIS — S069XAD Unspecified intracranial injury with loss of consciousness status unknown, subsequent encounter: Secondary | ICD-10-CM

## 2021-12-10 DIAGNOSIS — S46001S Unspecified injury of muscle(s) and tendon(s) of the rotator cuff of right shoulder, sequela: Secondary | ICD-10-CM | POA: Insufficient documentation

## 2021-12-10 DIAGNOSIS — Z87891 Personal history of nicotine dependence: Secondary | ICD-10-CM | POA: Insufficient documentation

## 2021-12-10 DIAGNOSIS — R412 Retrograde amnesia: Secondary | ICD-10-CM

## 2021-12-10 MED ORDER — METHYLPHENIDATE HCL 10 MG PO TABS: 10.0000 mg | ORAL_TABLET | Freq: Two times a day (BID) | ORAL | 0 refills | Status: DC
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
## 2021-12-10 NOTE — Patient Instructions (Signed)
ALWAYS FEEL FREE TO CALL OUR OFFICE WITH ANY PROBLEMS OR QUESTIONS [phone])  **PLEASE NOTE** ALL MEDICATION REFILL REQUESTS (INCLUDING CONTROLLED SUBSTANCES) NEED TO BE MADE AT LEAST 7 DAYS PRIOR TO REFILL BEING DUE. ANY REFILL REQUESTS INSIDE THAT TIME FRAME MAY RESULT IN DELAYS IN RECEIVING YOUR PRESCRIPTION.                    WE WILL NOT PROCEED WITH MRI.

## 2021-12-12 ENCOUNTER — Encounter: Payer: Self-pay | Admitting: Occupational Therapy

## 2021-12-12 ENCOUNTER — Ambulatory Visit: Payer: Medicaid Other | Admitting: Occupational Therapy

## 2021-12-12 ENCOUNTER — Encounter: Payer: Medicaid Other | Admitting: Speech Pathology

## 2021-12-12 DIAGNOSIS — R41841 Cognitive communication deficit: Secondary | ICD-10-CM | POA: Diagnosis not present

## 2021-12-12 DIAGNOSIS — F068 Other specified mental disorders due to known physiological condition: Secondary | ICD-10-CM

## 2021-12-12 DIAGNOSIS — R41842 Visuospatial deficit: Secondary | ICD-10-CM

## 2021-12-12 DIAGNOSIS — S069XAS Unspecified intracranial injury with loss of consciousness status unknown, sequela: Secondary | ICD-10-CM

## 2021-12-12 DIAGNOSIS — M25612 Stiffness of left shoulder, not elsewhere classified: Secondary | ICD-10-CM

## 2021-12-12 DIAGNOSIS — M25611 Stiffness of right shoulder, not elsewhere classified: Secondary | ICD-10-CM

## 2021-12-12 DIAGNOSIS — M6281 Muscle weakness (generalized): Secondary | ICD-10-CM

## 2021-12-12 DIAGNOSIS — R278 Other lack of coordination: Secondary | ICD-10-CM

## 2021-12-12 DIAGNOSIS — R262 Difficulty in walking, not elsewhere classified: Secondary | ICD-10-CM

## 2021-12-12 DIAGNOSIS — S069XAA Unspecified intracranial injury with loss of consciousness status unknown, initial encounter: Secondary | ICD-10-CM
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
## 2021-12-16 ENCOUNTER — Encounter: Payer: Medicaid Other | Admitting: Speech Pathology

## 2021-12-16 ENCOUNTER — Encounter: Payer: Self-pay | Admitting: Occupational Therapy

## 2021-12-16 ENCOUNTER — Ambulatory Visit: Payer: Medicaid Other | Admitting: Occupational Therapy

## 2021-12-16 DIAGNOSIS — R41841 Cognitive communication deficit: Secondary | ICD-10-CM | POA: Diagnosis not present

## 2021-12-16 DIAGNOSIS — R41842 Visuospatial deficit: Secondary | ICD-10-CM

## 2021-12-16 DIAGNOSIS — M25612 Stiffness of left shoulder, not elsewhere classified: Secondary | ICD-10-CM

## 2021-12-16 DIAGNOSIS — M25611 Stiffness of right shoulder, not elsewhere classified: Secondary | ICD-10-CM

## 2021-12-16 DIAGNOSIS — F068 Other specified mental disorders due to known physiological condition: Secondary | ICD-10-CM

## 2021-12-16 DIAGNOSIS — R278 Other lack of coordination: Secondary | ICD-10-CM
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
## 2021-12-16 NOTE — Patient Instructions (Signed)
SUPINE: Shoulder Flexion Bilateral (Cane)    Lie on back with knees bent. Hold cane with both hands. Raise both arms overhead, keep elbows straight. _10__ reps per set, _2__ sets per day.  Do in the morning before getting out of bed, and then again at night once back in bed.   SHOULDER: Flexion - Sitting    Hold cane with both hands. Raise arms up to eye level only. Keep elbows straight. Hold _3__ seconds. 10___ reps per set, _2__ sets per day   ROM: Abduction - Wand    Holding wand with left hand palm up, push wand directly out to Lt side, leading with other hand palm down, until stretch is felt. Hold _3___ seconds x 10 reps. Repeat to Rt side with Rt palm up.  Repeat __10__ times per set. Do __2__ sets per day.

## 2021-12-17 ENCOUNTER — Encounter: Payer: Medicaid Other | Admitting: Speech Pathology

## 2021-12-23 ENCOUNTER — Encounter: Payer: Self-pay | Admitting: Occupational Therapy

## 2021-12-23 ENCOUNTER — Ambulatory Visit: Payer: Medicaid Other | Admitting: Occupational Therapy

## 2021-12-23 DIAGNOSIS — R278 Other lack of coordination: Secondary | ICD-10-CM

## 2021-12-23 DIAGNOSIS — M25612 Stiffness of left shoulder, not elsewhere classified: Secondary | ICD-10-CM

## 2021-12-23 DIAGNOSIS — R41841 Cognitive communication deficit: Secondary | ICD-10-CM | POA: Diagnosis not present

## 2021-12-23 DIAGNOSIS — R41842 Visuospatial deficit: Secondary | ICD-10-CM
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
## 2021-12-23 NOTE — Patient Instructions (Signed)
Elbow Extension: Chair Stand - Resisted    With hands on armrests, push up from chair. Use left leg, keep Rt leg out. Return slowly . Repeat _10___ times per set.  Do __2-3__ sessions per day.   (Clinic) Retraction: Row - Bilateral (Pulley)    Facing pulley, arms reaching forward, pull hands toward stomach, pinching shoulder blades together. Repeat _10___ times per set. Do __2__ sets per day. Use yellow or red theraband  SHOULDER: Extension (Band)    SIT UP TALL: Start with arm slightly forward. Holding band, pull backward, past hip, keeping elbow straight. Do both arms together SLOWLY. Do not swing arm. Hold _3__ seconds. Use __yellow______ band. _10__ reps per set, _2__ sets per day

## 2022-01-05 ENCOUNTER — Telehealth: Payer: Self-pay

## 2022-01-05 ENCOUNTER — Other Ambulatory Visit: Payer: Self-pay

## 2022-01-05 DIAGNOSIS — S069XAS Unspecified intracranial injury with loss of consciousness status unknown, sequela: Secondary | ICD-10-CM

## 2022-01-05 MED ORDER — METHYLPHENIDATE HCL 10 MG PO TABS: 10.0000 mg | ORAL_TABLET | Freq: Two times a day (BID) | ORAL | 0 refills | Status: DC

## 2022-01-05 MED ORDER — GABAPENTIN 600 MG PO TABS: 600.0000 mg | ORAL_TABLET | Freq: Three times a day (TID) | ORAL | 6 refills | Status: DC

## 2022-01-05 NOTE — Telephone Encounter (Signed)
Please send Gabapentin to [facility] on [address] in [place] . Thank you

## 2022-01-05 NOTE — Telephone Encounter (Signed)
Rx written and sent to the pharmacy. Thanks!  

## 2022-01-05 NOTE — Telephone Encounter (Signed)
Please send Methylphenidate 10 MG to [facility]. He only has a 3-4 days  supply on hand.  Filled  Written  ID  Drug  QTY  Days  Prescriber  RX #  Dispenser  Refill  Daily Dose*  Pymt Type  PMP  11/24/2021 11/24/2021 1  Methylphenidate 10 Mg Tablet 90.00 30 Eu Tho [id number] Nor ([id number]) 0/0  Medicaid [place] 10/28/2021 10/28/2021 1  Methylphenidate 10 Mg Tablet 90.00 30 Za Swa [id number] Nor ([id number]) 0/0  Medicaid [place] 10/10/2021 10/10/2021 2  Methylphenidate 10 Mg Tablet 30.00 30 Da Ang [id number] Mos ([id number]) 0/0  Other [place]

## 2022-01-06 ENCOUNTER — Encounter: Payer: Self-pay | Admitting: *Deleted

## 2022-01-09 ENCOUNTER — Telehealth: Payer: Self-pay | Admitting: Physical Medicine & Rehabilitation

## 2022-01-09 NOTE — Telephone Encounter (Signed)
Patient's mother called requesting a PT referral for patient to receive therapy on knee. Patient is currently only in OT but the provider in [place] is requesting patient to receive PT on knee before they can do surgery .

## 2022-01-09 NOTE — Telephone Encounter (Signed)
Contacted patient and informed them they need to contact PCP at [facility]

## 2022-01-14 ENCOUNTER — Ambulatory Visit: Payer: Medicaid Other | Admitting: Physical Medicine & Rehabilitation

## 2022-01-27 ENCOUNTER — Ambulatory Visit: Payer: Medicaid Other | Attending: Physical Medicine and Rehabilitation | Admitting: Occupational Therapy

## 2022-01-27 ENCOUNTER — Encounter: Payer: Self-pay | Admitting: Occupational Therapy

## 2022-01-27 DIAGNOSIS — M25611 Stiffness of right shoulder, not elsewhere classified: Secondary | ICD-10-CM | POA: Diagnosis not present
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
## 2022-01-29 ENCOUNTER — Ambulatory Visit: Payer: Medicaid Other | Admitting: Occupational Therapy

## 2022-02-03 ENCOUNTER — Ambulatory Visit: Payer: Medicaid Other | Admitting: Occupational Therapy

## 2022-02-05 ENCOUNTER — Encounter: Payer: Medicaid Other | Admitting: Occupational Therapy

## 2022-02-06 ENCOUNTER — Telehealth: Payer: Self-pay | Admitting: Physical Medicine & Rehabilitation

## 2022-02-06 DIAGNOSIS — S069XAS Unspecified intracranial injury with loss of consciousness status unknown, sequela: Secondary | ICD-10-CM

## 2022-02-06 MED ORDER — GABAPENTIN 600 MG PO TABS: 600.0000 mg | ORAL_TABLET | Freq: Three times a day (TID) | ORAL | 6 refills | Status: DC

## 2022-02-06 MED ORDER — METHYLPHENIDATE HCL 10 MG PO TABS: 10.0000 mg | ORAL_TABLET | Freq: Two times a day (BID) | ORAL | 0 refills | Status: DC

## 2022-02-06 NOTE — Telephone Encounter (Signed)
Patient needs a refill on Gabapentin and also Methylphenidate (sent to [place]).  Any questions please call [phone].

## 2022-02-06 NOTE — Telephone Encounter (Signed)
A second ritalin rx was written for 03/07/22

## 2022-02-09 ENCOUNTER — Telehealth: Payer: Self-pay

## 2022-02-09 MED ORDER — METHYLPHENIDATE HCL 10 MG PO TABS: 10.0000 mg | ORAL_TABLET | Freq: Two times a day (BID) | ORAL | 0 refills | Status: DC

## 2022-02-09 NOTE — Telephone Encounter (Signed)
.  Rxs signed and sent to the pharmacy. Thanks!

## 2022-02-09 NOTE — Addendum Note (Signed)
Addended by: Alger Simons T on: 02/09/2022 03:50 PM   Modules accepted: Orders

## 2022-02-09 NOTE — Addendum Note (Signed)
Addended by: Franchot Gallo on: 02/09/2022 02:33 PM   Modules accepted: Orders

## 2022-02-09 NOTE — Telephone Encounter (Signed)
Please resend the Methylphenidate to [facility] on [address]   Per caller they do not have it in stock. I called [facility] to "cancel" the other 2 scripts of Methylphenidate.  Thank you

## 2022-02-10 ENCOUNTER — Encounter: Payer: Medicaid Other | Admitting: Occupational Therapy

## 2022-02-10 NOTE — Telephone Encounter (Signed)
Task completed

## 2022-02-12 ENCOUNTER — Encounter: Payer: Medicaid Other | Admitting: Occupational Therapy

## 2022-03-04 ENCOUNTER — Encounter: Payer: Self-pay | Admitting: Physical Medicine & Rehabilitation

## 2022-03-04 ENCOUNTER — Encounter: Payer: Medicaid Other | Attending: Physical Medicine & Rehabilitation | Admitting: Physical Medicine & Rehabilitation

## 2022-03-04 VITALS — BP 109/72 | HR 69 | Ht 67.0 in | Wt 120.0 lb

## 2022-03-04 DIAGNOSIS — S069XAS Unspecified intracranial injury with loss of consciousness status unknown, sequela: Secondary | ICD-10-CM | POA: Insufficient documentation

## 2022-03-04 DIAGNOSIS — S069X0S Unspecified intracranial injury without loss of consciousness, sequela: Secondary | ICD-10-CM | POA: Diagnosis present

## 2022-03-04 DIAGNOSIS — F068 Other specified mental disorders due to known physiological condition: Secondary | ICD-10-CM | POA: Insufficient documentation

## 2022-03-04 DIAGNOSIS — S069XAD Unspecified intracranial injury with loss of consciousness status unknown, subsequent encounter: Secondary | ICD-10-CM | POA: Diagnosis not present

## 2022-03-04 DIAGNOSIS — X58XXXD Exposure to other specified factors, subsequent encounter: Secondary | ICD-10-CM | POA: Diagnosis not present

## 2022-03-04 DIAGNOSIS — S069X0D Unspecified intracranial injury without loss of consciousness, subsequent encounter: Secondary | ICD-10-CM | POA: Insufficient documentation

## 2022-03-04 MED ORDER — METHYLPHENIDATE HCL 10 MG PO TABS: 10.0000 mg | ORAL_TABLET | Freq: Two times a day (BID) | ORAL | 0 refills | Status: DC

## 2022-03-04 NOTE — Patient Instructions (Addendum)
ALWAYS FEEL FREE TO CALL OUR OFFICE WITH ANY PROBLEMS OR QUESTIONS ([phone])  **PLEASE NOTE** ALL MEDICATION REFILL REQUESTS (INCLUDING CONTROLLED SUBSTANCES) NEED TO BE MADE AT LEAST 7 DAYS PRIOR TO REFILL BEING DUE. ANY REFILL REQUESTS INSIDE THAT TIME FRAME MAY RESULT IN DELAYS IN RECEIVING YOUR PRESCRIPTION.    Work on a schedule for your daily activities. The more you can keep a schedule each day, the more it will help with your memory.   Keep a memory book or small calendar with you in your pocket
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
## 2022-03-11 ENCOUNTER — Ambulatory Visit: Payer: Medicaid Other | Admitting: Physical Medicine & Rehabilitation

## 2022-03-11 ENCOUNTER — Other Ambulatory Visit: Payer: Self-pay

## 2022-03-11 NOTE — Telephone Encounter (Signed)
[facility] didn't have his medication, [facility] [address] [facility] it, please re-send to [facility] [address] please I have updated it in the system

## 2022-03-16 ENCOUNTER — Telehealth: Payer: Self-pay

## 2022-03-16 DIAGNOSIS — S069XAS Unspecified intracranial injury with loss of consciousness status unknown, sequela: Secondary | ICD-10-CM

## 2022-03-16 MED ORDER — METHYLPHENIDATE HCL 10 MG PO TABS: 10.0000 mg | ORAL_TABLET | Freq: Two times a day (BID) | ORAL | 0 refills | Status: DC

## 2022-03-16 NOTE — Telephone Encounter (Signed)
New rx's sent for this month AND March

## 2022-03-16 NOTE — Telephone Encounter (Signed)
Attempted to call and notify rx sent vm full.

## 2022-03-16 NOTE — Telephone Encounter (Signed)
Methylphenidate was suppose to be sent to [facility] [address], please send medication I have updated in the system, [facility] does not carry this prescription so [facility] will have to permanently [facility] to [facility] [address].

## 2022-04-08 ENCOUNTER — Telehealth: Payer: Self-pay

## 2022-04-08 DIAGNOSIS — S069XAS Unspecified intracranial injury with loss of consciousness status unknown, sequela: Secondary | ICD-10-CM

## 2022-04-11 ENCOUNTER — Other Ambulatory Visit: Payer: Self-pay | Admitting: Physical Medicine & Rehabilitation

## 2022-04-17 MED ORDER — PROPRANOLOL HCL 40 MG PO TABS: 40.0000 mg | ORAL_TABLET | Freq: Three times a day (TID) | ORAL | 4 refills | Status: DC

## 2022-04-17 NOTE — Telephone Encounter (Signed)
Refill Propranolol sent to the [facility].

## 2022-04-20 DIAGNOSIS — Z87820 Personal history of traumatic brain injury: Secondary | ICD-10-CM | POA: Insufficient documentation

## 2022-05-20 ENCOUNTER — Telehealth: Payer: Self-pay | Admitting: *Deleted

## 2022-05-20 DIAGNOSIS — S069XAS Unspecified intracranial injury with loss of consciousness status unknown, sequela: Secondary | ICD-10-CM

## 2022-05-20 MED ORDER — METHYLPHENIDATE HCL 10 MG PO TABS: 10.0000 mg | ORAL_TABLET | Freq: Two times a day (BID) | ORAL | 0 refills | Status: DC

## 2022-05-20 MED ORDER — APIXABAN 5 MG PO TABS: 5.0000 mg | ORAL_TABLET | Freq: Two times a day (BID) | ORAL | 4 refills | Status: DC

## 2022-05-20 NOTE — Telephone Encounter (Signed)
Refill request Methylphenidate 10 mg please send to [facility] Also Eliquis send to [facility]

## 2022-05-20 NOTE — Telephone Encounter (Signed)
sent 

## 2022-06-02 ENCOUNTER — Ambulatory Visit: Payer: Medicaid Other | Attending: Orthopaedic Surgery | Admitting: Physical Therapy

## 2022-06-02 ENCOUNTER — Encounter: Payer: Self-pay | Admitting: Physical Therapy

## 2022-06-02 DIAGNOSIS — M25551 Pain in right hip: Secondary | ICD-10-CM | POA: Diagnosis present

## 2022-06-02 DIAGNOSIS — R262 Difficulty in walking, not elsewhere classified: Secondary | ICD-10-CM | POA: Insufficient documentation

## 2022-06-02 DIAGNOSIS — M6281 Muscle weakness (generalized): Secondary | ICD-10-CM | POA: Insufficient documentation

## 2022-06-02 DIAGNOSIS — M25661 Stiffness of right knee, not elsewhere classified: Secondary | ICD-10-CM | POA: Diagnosis present
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
## 2022-06-08 ENCOUNTER — Ambulatory Visit: Payer: Medicaid Other | Admitting: Physical Therapy

## 2022-06-08 ENCOUNTER — Encounter: Payer: Self-pay | Admitting: Physical Therapy

## 2022-06-08 DIAGNOSIS — M6281 Muscle weakness (generalized): Secondary | ICD-10-CM | POA: Diagnosis not present

## 2022-06-08 DIAGNOSIS — M25551 Pain in right hip: Secondary | ICD-10-CM

## 2022-06-08 DIAGNOSIS — R262 Difficulty in walking, not elsewhere classified: Secondary | ICD-10-CM

## 2022-06-08 DIAGNOSIS — M25661 Stiffness of right knee, not elsewhere classified: Secondary | ICD-10-CM
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
## 2022-06-08 NOTE — Addendum Note (Signed)
Addended by: Drake Leach on: 06/08/2022 07:58 AM   Modules accepted: Orders

## 2022-06-09 ENCOUNTER — Telehealth: Payer: Self-pay | Admitting: Physical Therapy

## 2022-06-09 NOTE — Telephone Encounter (Signed)
PT spoke to receptionist at [facility] at [facility] regarding precautions and concerns for pt pain management.  Surgeon maintains toe touch weight bearing x4 wks and no hip ROM precautions beyond that.  Pt is allowed in functional sitting positions with weight on the right hip.  He does not need a hip brace of any kind.  They would like PT to focus on knee contracture, but can range the hip in order to work on this.   Camille Bal, PT, DPT

## 2022-06-09 NOTE — Telephone Encounter (Signed)
PT LVM for Tamila, receptionist at [facility] at [place] in regards to pt precautions (WB status, ROM of hip, functional AROM of right LE, etc.) with respect to recent hip resection and increased pt pain during transfers.  Also to provide transition of care as primary PT is out-of-town.  Camille Bal, PT, DPT

## 2022-06-16 ENCOUNTER — Ambulatory Visit: Payer: Medicaid Other | Admitting: Physical Therapy

## 2022-06-16 ENCOUNTER — Encounter: Payer: Self-pay | Admitting: Physical Therapy

## 2022-06-16 DIAGNOSIS — R262 Difficulty in walking, not elsewhere classified: Secondary | ICD-10-CM

## 2022-06-16 DIAGNOSIS — M6281 Muscle weakness (generalized): Secondary | ICD-10-CM

## 2022-06-16 DIAGNOSIS — M25661 Stiffness of right knee, not elsewhere classified: Secondary | ICD-10-CM

## 2022-06-16 DIAGNOSIS — M25551 Pain in right hip: Secondary | ICD-10-CM
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
## 2022-06-20 ENCOUNTER — Emergency Department (HOSPITAL_COMMUNITY)
Admission: EM | Admit: 2022-06-20 | Discharge: 2022-06-21 | Disposition: A | Discharge status: Home / Self Care | Payer: Medicaid Other | Attending: Emergency Medicine | Admitting: Emergency Medicine

## 2022-06-20 ENCOUNTER — Other Ambulatory Visit: Payer: Self-pay

## 2022-06-20 ENCOUNTER — Encounter (HOSPITAL_COMMUNITY): Payer: Self-pay | Admitting: Emergency Medicine

## 2022-06-20 DIAGNOSIS — N200 Calculus of kidney: Secondary | ICD-10-CM | POA: Insufficient documentation

## 2022-06-20 DIAGNOSIS — Z7901 Long term (current) use of anticoagulants: Secondary | ICD-10-CM | POA: Insufficient documentation

## 2022-06-20 DIAGNOSIS — R319 Hematuria, unspecified: Secondary | ICD-10-CM | POA: Diagnosis present

## 2022-06-20 LAB — URINALYSIS, ROUTINE W REFLEX MICROSCOPIC
Bilirubin Urine: NEGATIVE
Glucose, UA: NEGATIVE mg/dL
Ketones, ur: NEGATIVE mg/dL
Leukocytes,Ua: NEGATIVE
Nitrite: NEGATIVE
Protein, ur: 100 mg/dL — AB
RBC / HPF: >50 RBC/hpf (ref 0–5)
Specific Gravity, Urine: 1.025 (ref 1.005–1.030)
pH: 6 (ref 5.0–8.0)

## 2022-06-20 LAB — CBC
HCT: 46.8 % (ref 39.0–52.0)
Hemoglobin: 15.2 g/dL (ref 13.0–17.0)
MCH: 29.4 pg (ref 26.0–34.0)
MCHC: 32.5 g/dL (ref 30.0–36.0)
MCV: 90.5 fL (ref 80.0–100.0)
Platelets: 282 10*3/uL (ref 150–400)
RBC: 5.17 MIL/uL (ref 4.22–5.81)
RDW: 12.6 % (ref 11.5–15.5)
WBC: 7.2 10*3/uL (ref 4.0–10.5)
nRBC: 0 % (ref 0.0–0.2)

## 2022-06-20 LAB — BASIC METABOLIC PANEL
Anion gap: 12 (ref 5–15)
BUN: 19 mg/dL (ref 6–20)
CO2: 26 mmol/L (ref 22–32)
Calcium: 9.5 mg/dL (ref 8.9–10.3)
Chloride: 97 mmol/L — ABNORMAL LOW (ref 98–111)
Creatinine, Ser: 0.71 mg/dL (ref 0.61–1.24)
GFR, Estimated: >60 mL/min (ref >60)
Glucose, Bld: 109 mg/dL — ABNORMAL HIGH (ref 70–99)
Potassium: 3.9 mmol/L (ref 3.5–5.1)
Sodium: 135 mmol/L (ref 135–145)
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
## 2022-06-21 ENCOUNTER — Emergency Department (HOSPITAL_COMMUNITY): Payer: Medicaid Other

## 2022-06-21 MED ORDER — OXYCODONE-ACETAMINOPHEN 5-325 MG PO TABS: 1.0000 | ORAL_TABLET | Freq: Three times a day (TID) | ORAL | 0 refills | Status: DC | PRN

## 2022-06-21 MED ORDER — KETOROLAC TROMETHAMINE 30 MG/ML IJ SOLN
15.0000 mg | Freq: Once | INTRAMUSCULAR | Status: AC
  Administered 2022-06-21: 15 mg via INTRAVENOUS
  Filled 2022-06-21: qty 1

## 2022-06-21 MED ORDER — ONDANSETRON 4 MG PO TBDP: ORAL_TABLET | ORAL | 0 refills | Status: DC

## 2022-06-21 MED ORDER — TAMSULOSIN HCL 0.4 MG PO CAPS: 0.4000 mg | ORAL_CAPSULE | Freq: Every day | ORAL | 0 refills | Status: DC

## 2022-06-21 MED ORDER — KETOROLAC TROMETHAMINE 10 MG PO TABS: 10.0000 mg | ORAL_TABLET | Freq: Four times a day (QID) | ORAL | 0 refills | Status: DC | PRN

## 2022-06-21 MED ORDER — TAMSULOSIN HCL 0.4 MG PO CAPS
0.4000 mg | ORAL_CAPSULE | Freq: Once | ORAL | Status: AC
  Administered 2022-06-21: 0.4 mg via ORAL
  Filled 2022-06-21: qty 1

## 2022-06-21 NOTE — ED Provider Notes (Signed)
[place] EMERGENCY DEPARTMENT AT [facility] Provider Note   CSN: [id number] Arrival date & time: 06/20/22  2056     History  Chief Complaint  Patient presents with   Hematuria    Jeff [name] is a 22 y.o. male.  Here with a few days of painless hematuria. No fevers. No h/o kidney stones. Does have a history of increased urinary urgency and frequency from the last few months. Was told it could be neurologic. States he feels the need to urinate but by the time he gets halfway to the bathroom he can't hold it any longer. No weakness or anesthesia. No other new issues.    Hematuria       Home Medications Prior to Admission medications   Medication Sig Start Date End Date Taking? Authorizing Provider  ketorolac (TORADOL) 10 MG tablet Take 1 tablet (10 mg total) by mouth every 6 (six) hours as needed for moderate pain. 06/21/22  Yes Armour Villanueva, Barbara Cower, MD  ondansetron (ZOFRAN-ODT) 4 MG disintegrating tablet 4mg  ODT q4 hours prn nausea/vomit 06/21/22  Yes Jozee Hammer, Barbara Cower, MD  oxyCODONE-acetaminophen (PERCOCET) 5-325 MG tablet Take 1 tablet by mouth every 8 (eight) hours as needed. 06/21/22  Yes Meriel Kelliher, Barbara Cower, MD  tamsulosin (FLOMAX) 0.4 MG CAPS capsule Take 1 capsule (0.4 mg total) by mouth daily. Until stone passes 06/21/22  Yes Solaris Kram, Barbara Cower, MD  apixaban (ELIQUIS) 5 MG TABS tablet Take 1 tablet (5 mg total) by mouth 2 (two) times daily. 05/20/22   Ranelle Oyster, MD  ascorbic acid (VITAMIN C) 500 MG tablet Take 1 tablet (500 mg total) by mouth daily. 11/04/21   Ranelle Oyster, MD  gabapentin (NEURONTIN) 600 MG tablet Take 1 tablet (600 mg total) by mouth 3 (three) times daily. 02/06/22   Ranelle Oyster, MD  methylphenidate (RITALIN) 10 MG tablet Take 1 tablet (10 mg total) by mouth 2 (two) times daily with breakfast and lunch. At 0700 and 1200 daily 05/20/22   Ranelle Oyster, MD  methylphenidate (RITALIN) 10 MG tablet Take 1 tablet (10 mg total) by mouth  2 (two) times daily with breakfast and lunch. At 0700 and 1200 daily 05/20/22   Ranelle Oyster, MD  propranolol (INDERAL) 40 MG tablet Take 1 tablet (40 mg total) by mouth 3 (three) times daily. 04/17/22   Ranelle Oyster, MD  sertraline (ZOLOFT) 50 MG tablet Take 1 tablet (50 mg total) by mouth at bedtime. 11/04/21   Ranelle Oyster, MD  vitamin D3 (CHOLECALCIFEROL) 25 MCG tablet Take 1 tablet (1,000 Units total) by mouth daily. 11/04/21   Ranelle Oyster, MD  zinc gluconate 50 MG tablet Take 50 mg by mouth daily.    [provider]  ferrous sulfate 325 (65 FE) MG tablet Take 1 tablet (325 mg total) by mouth 2 (two) times daily with a meal. 01/13/17 06/01/20  Meuth, Brooke A, PA-C      Allergies    Whole blood    Review of Systems   Review of Systems  Genitourinary:  Positive for hematuria.    Physical Exam Updated Vital Signs BP 128/79 (BP Location: Right Arm)   Pulse 85   Temp 98.4 F (36.9 C) (Oral)   Resp 16   Wt 61.2 kg   SpO2 98%   BMI 21.14 kg/m  Physical Exam Vitals and nursing note reviewed.  Constitutional:      Appearance: He is well-developed.  HENT:     Head: Normocephalic and  atraumatic.  Eyes:     Pupils: Pupils are equal, round, and reactive to light.  Cardiovascular:     Rate and Rhythm: Normal rate.  Pulmonary:     Effort: Pulmonary effort is normal. No respiratory distress.  Abdominal:     General: There is no distension.  Musculoskeletal:        General: Normal range of motion.     Cervical back: Normal range of motion.  Skin:    General: Skin is warm and dry.  Neurological:     General: No focal deficit present.     Mental Status: He is alert.     ED Results / Procedures / Treatments   Labs (all labs ordered are listed, but only abnormal results are displayed) Labs Reviewed  URINALYSIS, ROUTINE W REFLEX MICROSCOPIC - Abnormal; Notable for the following components:      Result Value   Color, Urine AMBER (*)    APPearance  CLOUDY (*)    Hgb urine dipstick MODERATE (*)    Protein, ur 100 (*)    Bacteria, UA FEW (*)    All other components within normal limits  BASIC METABOLIC PANEL - Abnormal; Notable for the following components:   Chloride 97 (*)    Glucose, Bld 109 (*)    All other components within normal limits  URINE CULTURE  CBC    EKG None  Radiology CT Renal Stone Study  Result Date: 06/21/2022 CLINICAL DATA:  Abdominal/flank pain, stone suspected. EXAM: CT ABDOMEN AND PELVIS WITHOUT CONTRAST TECHNIQUE: Multidetector CT imaging of the abdomen and pelvis was performed following the standard protocol without IV contrast. RADIATION DOSE REDUCTION: This exam was performed according to the departmental dose-optimization program which includes automated exposure control, adjustment of the mA and/or kV according to patient size and/or use of iterative reconstruction technique. COMPARISON:  06/14/2016. FINDINGS: Lower chest: Atelectasis is present at the lung bases. Hepatobiliary: No focal liver abnormality is seen. No gallstones, gallbladder wall thickening, or biliary dilatation. Pancreas: Unremarkable. No pancreatic ductal dilatation or surrounding inflammatory changes. Spleen: Normal in size without focal abnormality. Adrenals/Urinary Tract: The adrenal glands are within normal limits. Nonobstructive renal calculi are present bilaterally. There is a 3 mm calculus in the proximal right ureter at the UVJ without evidence of obstruction. No obstructive uropathy on the left. There is mild diffuse bladder wall thickening. Stomach/Bowel: Stomach is within normal limits. Appendix appears normal. No evidence of bowel wall thickening, distention, or inflammatory changes. No free air or pneumatosis. A few scattered diverticula are present along the colon without evidence of diverticulitis. Vascular/Lymphatic: No significant vascular findings are present. No enlarged abdominal or pelvic lymph nodes. Reproductive: Prostate  is unremarkable. Other: No abdominopelvic ascites. Musculoskeletal: There is bony deformity of the acetabulum on the right with suspected surgical resection of the right femoral head. There is a large joint effusion containing bony fragments at the right hip. There is superior dislocation of the proximal left femur at the acetabulum. IMPRESSION: 1. 3 mm calculus in the proximal right ureter at the UVJ without evidence of obstruction. 2. Bilateral nephrolithiasis. 3. Bony deformity of the acetabulum on the right suggesting old healed fracture. Suspected surgical resection of the right femoral head with superior subluxation of the proximal right femur at the hip joint. There is a large pleural effusion at the right hip with bony fragments. Correlation with surgical history is recommended. Electronically Signed   By: Thornell Sartorius M.D.   On: 06/21/2022 03:07  Procedures Procedures    Medications Ordered in ED Medications  ketorolac (TORADOL) 30 MG/ML injection 15 mg (15 mg Intravenous Given 06/21/22 0427)  tamsulosin (FLOMAX) capsule 0.4 mg (0.4 mg Oral Given 06/21/22 0427)    ED Course/ Medical Decision Making/ A&P                             Medical Decision Making Amount and/or Complexity of Data Reviewed Labs: ordered. Radiology: ordered.  Risk Prescription drug management.   Ct interpreted by myself as small right sided proximal stone. Radiology read reviewed and shows no e/o obstruction. Renal function good. Urine with blood but no infection, will send for culture. Long discussion with patient and family that they need to follow up with uroloyg if blood not improving or if the urgency symptoms don't improve, office number provided. Symptomatic care at home. Not currently in pain so will hold on opiates unless he needs them.    Final Clinical Impression(s) / ED Diagnoses Final diagnoses:  Kidney stone    Rx / DC Orders ED Discharge Orders          Ordered     oxyCODONE-acetaminophen (PERCOCET) 5-325 MG tablet  Every 8 hours PRN        06/21/22 0415    ketorolac (TORADOL) 10 MG tablet  Every 6 hours PRN        06/21/22 0416    tamsulosin (FLOMAX) 0.4 MG CAPS capsule  Daily        06/21/22 0416    ondansetron (ZOFRAN-ODT) 4 MG disintegrating tablet        06/21/22 0416              Kanai Berrios, Barbara Cower, MD 06/21/22 [id number]

## 2022-06-22 LAB — URINE CULTURE: Culture: NO GROWTH

## 2022-06-25 ENCOUNTER — Ambulatory Visit: Payer: Medicaid Other | Admitting: Physical Therapy

## 2022-06-25 ENCOUNTER — Encounter: Payer: Self-pay | Admitting: Physical Therapy

## 2022-06-25 DIAGNOSIS — M25661 Stiffness of right knee, not elsewhere classified: Secondary | ICD-10-CM

## 2022-06-25 DIAGNOSIS — M6281 Muscle weakness (generalized): Secondary | ICD-10-CM | POA: Diagnosis not present

## 2022-06-25 DIAGNOSIS — M25551 Pain in right hip: Secondary | ICD-10-CM

## 2022-06-25 DIAGNOSIS — R262 Difficulty in walking, not elsewhere classified: Secondary | ICD-10-CM
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
## 2022-07-01 ENCOUNTER — Encounter: Payer: Self-pay | Admitting: Physical Medicine & Rehabilitation

## 2022-07-01 ENCOUNTER — Encounter: Payer: Medicaid Other | Attending: Physical Medicine & Rehabilitation | Admitting: Physical Medicine & Rehabilitation

## 2022-07-01 VITALS — BP 103/69 | HR 103 | Ht 67.0 in | Wt 137.0 lb

## 2022-07-01 DIAGNOSIS — M24561 Contracture, right knee: Secondary | ICD-10-CM | POA: Insufficient documentation

## 2022-07-01 DIAGNOSIS — I824Y1 Acute embolism and thrombosis of unspecified deep veins of right proximal lower extremity: Secondary | ICD-10-CM | POA: Diagnosis present

## 2022-07-01 DIAGNOSIS — S069XAD Unspecified intracranial injury with loss of consciousness status unknown, subsequent encounter: Secondary | ICD-10-CM | POA: Diagnosis not present

## 2022-07-01 DIAGNOSIS — S069XAS Unspecified intracranial injury with loss of consciousness status unknown, sequela: Secondary | ICD-10-CM | POA: Diagnosis present
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
## 2022-07-01 NOTE — Patient Instructions (Signed)
**  Per discussion with Dr. Alycia Rossetti patient to remain TTWB x6 weeks post-operatively. May then advance to [facility] through the RLE. Continue to work with PT on knee motion with goal of extension to 0-5 degrees. He will follow up with Dr. Alycia Rossetti at 3 months post op for motion check.   STOP GABAPENTIN AND PROPRANOLOL  WILL STAY WITH ELIQUIS FOR NOW. PERHAPS WILL STOP IT AT END OF SUMMER   ALWAYS FEEL FREE TO CALL OUR OFFICE WITH ANY PROBLEMS OR QUESTIONS [phone])  **PLEASE NOTE** ALL MEDICATION REFILL REQUESTS (INCLUDING CONTROLLED SUBSTANCES) NEED TO BE MADE AT LEAST 7 DAYS PRIOR TO REFILL BEING DUE. ANY REFILL REQUESTS INSIDE THAT TIME FRAME MAY RESULT IN DELAYS IN RECEIVING YOUR PRESCRIPTION.

## 2022-07-13 ENCOUNTER — Ambulatory Visit: Payer: Medicaid Other | Attending: Orthopaedic Surgery | Admitting: Physical Therapy

## 2022-07-13 ENCOUNTER — Encounter: Payer: Self-pay | Admitting: Physical Therapy

## 2022-07-13 DIAGNOSIS — M6281 Muscle weakness (generalized): Secondary | ICD-10-CM | POA: Insufficient documentation

## 2022-07-13 DIAGNOSIS — M25661 Stiffness of right knee, not elsewhere classified: Secondary | ICD-10-CM | POA: Diagnosis present

## 2022-07-13 DIAGNOSIS — M25551 Pain in right hip: Secondary | ICD-10-CM | POA: Insufficient documentation

## 2022-07-13 DIAGNOSIS — M25612 Stiffness of left shoulder, not elsewhere classified: Secondary | ICD-10-CM | POA: Insufficient documentation

## 2022-07-13 DIAGNOSIS — M25611 Stiffness of right shoulder, not elsewhere classified: Secondary | ICD-10-CM | POA: Diagnosis present

## 2022-07-13 DIAGNOSIS — R262 Difficulty in walking, not elsewhere classified: Secondary | ICD-10-CM | POA: Diagnosis present
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
## 2022-07-17 ENCOUNTER — Telehealth: Payer: Self-pay | Admitting: *Deleted

## 2022-07-17 DIAGNOSIS — S069XAS Unspecified intracranial injury with loss of consciousness status unknown, sequela: Secondary | ICD-10-CM

## 2022-07-17 MED ORDER — METHYLPHENIDATE HCL 10 MG PO TABS: 10.0000 mg | ORAL_TABLET | Freq: Two times a day (BID) | ORAL | 0 refills | Status: DC

## 2022-07-17 NOTE — Telephone Encounter (Signed)
Please refill methylphenidate [facility] [address]

## 2022-07-17 NOTE — Telephone Encounter (Signed)
Done. Second rx was written for July

## 2022-07-23 ENCOUNTER — Encounter: Payer: Self-pay | Admitting: Physical Therapy

## 2022-07-23 ENCOUNTER — Ambulatory Visit: Payer: Medicaid Other | Admitting: Physical Therapy

## 2022-07-23 DIAGNOSIS — M6281 Muscle weakness (generalized): Secondary | ICD-10-CM | POA: Diagnosis not present

## 2022-07-23 DIAGNOSIS — M25551 Pain in right hip: Secondary | ICD-10-CM

## 2022-07-23 DIAGNOSIS — R262 Difficulty in walking, not elsewhere classified: Secondary | ICD-10-CM

## 2022-07-23 DIAGNOSIS — M25661 Stiffness of right knee, not elsewhere classified: Secondary | ICD-10-CM
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
## 2022-07-27 ENCOUNTER — Ambulatory Visit (INDEPENDENT_AMBULATORY_CARE_PROVIDER_SITE_OTHER): Payer: Medicaid Other | Admitting: Nurse Practitioner

## 2022-07-27 ENCOUNTER — Encounter: Payer: Self-pay | Admitting: Nurse Practitioner

## 2022-07-27 VITALS — BP 111/66 | HR 87 | Temp 97.8°F | Ht 67.0 in | Wt 130.0 lb

## 2022-07-27 DIAGNOSIS — F068 Other specified mental disorders due to known physiological condition: Secondary | ICD-10-CM | POA: Diagnosis not present

## 2022-07-27 DIAGNOSIS — R809 Proteinuria, unspecified: Secondary | ICD-10-CM

## 2022-07-27 DIAGNOSIS — R319 Hematuria, unspecified: Secondary | ICD-10-CM

## 2022-07-27 DIAGNOSIS — I824Y1 Acute embolism and thrombosis of unspecified deep veins of right proximal lower extremity: Secondary | ICD-10-CM

## 2022-07-27 DIAGNOSIS — S73004S Unspecified dislocation of right hip, sequela: Secondary | ICD-10-CM | POA: Diagnosis not present

## 2022-07-27 DIAGNOSIS — S069X0S Unspecified intracranial injury without loss of consciousness, sequela: Secondary | ICD-10-CM

## 2022-07-27 NOTE — Assessment & Plan Note (Signed)
Status post hip surgery Has been ambulating more recently Patient encouraged to maintain close follow-up with orthopedics and physical therapy

## 2022-07-27 NOTE — Patient Instructions (Signed)

## 2022-07-27 NOTE — Assessment & Plan Note (Signed)
On and off in the past Was recently diagnosed with kidney stones He currently denies hematuria Will refer patient to urology

## 2022-07-27 NOTE — Assessment & Plan Note (Signed)
Continue current Eliquis 5 mg twice daily Med managed by orthopedics
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
## 2022-07-27 NOTE — Assessment & Plan Note (Addendum)
Noted on his recent UA, has also had protein in the urine in the past  Currently on treatment for UTI Will refer patient to neurology
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
## 2022-07-28 ENCOUNTER — Encounter: Payer: Self-pay | Admitting: Physical Therapy

## 2022-07-28 ENCOUNTER — Ambulatory Visit: Payer: Medicaid Other | Attending: Orthopaedic Surgery | Admitting: Physical Therapy

## 2022-07-28 DIAGNOSIS — M25612 Stiffness of left shoulder, not elsewhere classified: Secondary | ICD-10-CM | POA: Insufficient documentation

## 2022-07-28 DIAGNOSIS — R262 Difficulty in walking, not elsewhere classified: Secondary | ICD-10-CM | POA: Diagnosis present

## 2022-07-28 DIAGNOSIS — M25661 Stiffness of right knee, not elsewhere classified: Secondary | ICD-10-CM

## 2022-07-28 DIAGNOSIS — M6281 Muscle weakness (generalized): Secondary | ICD-10-CM | POA: Diagnosis present

## 2022-07-28 DIAGNOSIS — M25611 Stiffness of right shoulder, not elsewhere classified: Secondary | ICD-10-CM | POA: Diagnosis present

## 2022-07-28 DIAGNOSIS — M25551 Pain in right hip: Secondary | ICD-10-CM | POA: Diagnosis present
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
## 2022-08-04 ENCOUNTER — Ambulatory Visit: Payer: Medicaid Other | Admitting: Physical Therapy

## 2022-08-04 ENCOUNTER — Encounter: Payer: Self-pay | Admitting: Physical Therapy

## 2022-08-04 DIAGNOSIS — R262 Difficulty in walking, not elsewhere classified: Secondary | ICD-10-CM

## 2022-08-04 DIAGNOSIS — M25661 Stiffness of right knee, not elsewhere classified: Secondary | ICD-10-CM

## 2022-08-04 DIAGNOSIS — M25612 Stiffness of left shoulder, not elsewhere classified: Secondary | ICD-10-CM

## 2022-08-04 DIAGNOSIS — M25551 Pain in right hip: Secondary | ICD-10-CM

## 2022-08-04 DIAGNOSIS — M6281 Muscle weakness (generalized): Secondary | ICD-10-CM | POA: Diagnosis not present

## 2022-08-04 DIAGNOSIS — M25611 Stiffness of right shoulder, not elsewhere classified: Secondary | ICD-10-CM

## 2022-08-04 NOTE — Patient Instructions (Addendum)
You Can Walk For A Certain Length Of Time Each Day                          Walk 20 minutes 1 times per day.             Increase 2-3  minutes every week              Work up to 30 minutes (1-2 times per day).               Example:                         Day 1-2           4-5 minutes     3 times per day                         Day 7-8           10-12 minutes 2-3 times per day                         Day 13-14       20-22 minutes 1-2 times per day  Access Code: [email or web address] URL: [email or web address] Date: 08/04/2022 Prepared by: Camille Bal  Exercises - Supine Knee Extension Stretch on Towel Roll  - 10 x daily - 7 x weekly - 2 sets - 60 hold - Supine Short Arc Quad (Mirrored)  - 2 x daily - 7 x weekly - 2 sets - 10 reps - Supine Ankle Pumps  - 2 x daily - 7 x weekly - 2 sets - 10 reps - Seated Toe Raise  - 2 x daily - 7 x weekly - 2 sets - 10 reps - Supine Ankle Dorsiflexion Stretch with Caregiver  - 2 x daily - 7 x weekly - 3 sets - 30 hold - Seated Knee Extension Stretch with Chair  - 1 x daily - 7 x weekly - 3 sets - Standing Anterior Posterior Weight Shift with Chair  - 2 x daily - 7 x weekly - 2 sets - 10 reps - Mini Squat with Counter Support  - 1 x daily - 7 x weekly - 2-3 sets - 10 reps
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
## 2022-08-11 ENCOUNTER — Ambulatory Visit: Payer: Medicaid Other | Admitting: Physical Therapy

## 2022-08-11 ENCOUNTER — Encounter: Payer: Self-pay | Admitting: Physical Therapy

## 2022-08-11 DIAGNOSIS — M25661 Stiffness of right knee, not elsewhere classified: Secondary | ICD-10-CM

## 2022-08-11 DIAGNOSIS — R262 Difficulty in walking, not elsewhere classified: Secondary | ICD-10-CM

## 2022-08-11 DIAGNOSIS — M25551 Pain in right hip: Secondary | ICD-10-CM

## 2022-08-11 DIAGNOSIS — M6281 Muscle weakness (generalized): Secondary | ICD-10-CM | POA: Diagnosis not present
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
## 2022-09-23 ENCOUNTER — Encounter: Payer: Self-pay | Admitting: Physical Medicine & Rehabilitation

## 2022-09-23 ENCOUNTER — Encounter: Payer: Medicaid Other | Attending: Physical Medicine & Rehabilitation | Admitting: Physical Medicine & Rehabilitation

## 2022-09-23 VITALS — BP 116/76 | HR 88 | Ht 67.0 in | Wt 132.0 lb

## 2022-09-23 DIAGNOSIS — S069X0S Unspecified intracranial injury without loss of consciousness, sequela: Secondary | ICD-10-CM | POA: Insufficient documentation

## 2022-09-23 DIAGNOSIS — S32424D Nondisplaced fracture of posterior wall of right acetabulum, subsequent encounter for fracture with routine healing: Secondary | ICD-10-CM | POA: Insufficient documentation

## 2022-09-23 DIAGNOSIS — S069X0D Unspecified intracranial injury without loss of consciousness, subsequent encounter: Secondary | ICD-10-CM | POA: Insufficient documentation

## 2022-09-23 DIAGNOSIS — S32424S Nondisplaced fracture of posterior wall of right acetabulum, sequela: Secondary | ICD-10-CM | POA: Diagnosis not present

## 2022-09-23 DIAGNOSIS — Z7901 Long term (current) use of anticoagulants: Secondary | ICD-10-CM | POA: Diagnosis not present

## 2022-09-23 DIAGNOSIS — F068 Other specified mental disorders due to known physiological condition: Secondary | ICD-10-CM | POA: Diagnosis not present

## 2022-09-23 DIAGNOSIS — M24561 Contracture, right knee: Secondary | ICD-10-CM | POA: Diagnosis not present

## 2022-09-23 DIAGNOSIS — Z87891 Personal history of nicotine dependence: Secondary | ICD-10-CM | POA: Insufficient documentation

## 2022-09-23 DIAGNOSIS — Z5189 Encounter for other specified aftercare: Secondary | ICD-10-CM | POA: Insufficient documentation

## 2022-09-23 DIAGNOSIS — S069XAS Unspecified intracranial injury with loss of consciousness status unknown, sequela: Secondary | ICD-10-CM

## 2022-09-23 NOTE — Patient Instructions (Signed)
ALWAYS FEEL FREE TO CALL OUR OFFICE WITH ANY PROBLEMS OR QUESTIONS ([phone])  **PLEASE NOTE** ALL MEDICATION REFILL REQUESTS (INCLUDING CONTROLLED SUBSTANCES) NEED TO BE MADE AT LEAST 7 DAYS PRIOR TO REFILL BEING DUE. ANY REFILL REQUESTS INSIDE THAT TIME FRAME MAY RESULT IN DELAYS IN RECEIVING YOUR PRESCRIPTION.                    
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
## 2022-10-08 ENCOUNTER — Emergency Department (HOSPITAL_COMMUNITY)
Admission: EM | Admit: 2022-10-08 | Discharge: 2022-10-08 | Disposition: A | Discharge status: Home / Self Care | Payer: Medicaid Other | Attending: Emergency Medicine | Admitting: Emergency Medicine

## 2022-10-08 ENCOUNTER — Encounter (HOSPITAL_COMMUNITY): Payer: Self-pay

## 2022-10-08 ENCOUNTER — Other Ambulatory Visit: Payer: Self-pay

## 2022-10-08 DIAGNOSIS — Z7901 Long term (current) use of anticoagulants: Secondary | ICD-10-CM | POA: Insufficient documentation

## 2022-10-08 DIAGNOSIS — L600 Ingrowing nail: Secondary | ICD-10-CM | POA: Insufficient documentation

## 2022-10-08 LAB — CBC WITH DIFFERENTIAL/PLATELET
Abs Immature Granulocytes: 0.01 10*3/uL (ref 0.00–0.07)
Basophils Absolute: 0.1 10*3/uL (ref 0.0–0.1)
Basophils Relative: 1 %
Eosinophils Absolute: 0.1 10*3/uL (ref 0.0–0.5)
Eosinophils Relative: 2 %
HCT: 47.7 % (ref 39.0–52.0)
Hemoglobin: 15.9 g/dL (ref 13.0–17.0)
Immature Granulocytes: 0 %
Lymphocytes Relative: 48 %
Lymphs Abs: 2.7 10*3/uL (ref 0.7–4.0)
MCH: 29.3 pg (ref 26.0–34.0)
MCHC: 33.3 g/dL (ref 30.0–36.0)
MCV: 88 fL (ref 80.0–100.0)
Monocytes Absolute: 0.4 10*3/uL (ref 0.1–1.0)
Monocytes Relative: 6 %
Neutro Abs: 2.5 10*3/uL (ref 1.7–7.7)
Neutrophils Relative %: 43 %
Platelets: 268 10*3/uL (ref 150–400)
RBC: 5.42 MIL/uL (ref 4.22–5.81)
RDW: 13.1 % (ref 11.5–15.5)
WBC: 5.8 10*3/uL (ref 4.0–10.5)
nRBC: 0 % (ref 0.0–0.2)

## 2022-10-08 LAB — COMPREHENSIVE METABOLIC PANEL
ALT: 22 U/L (ref 0–44)
AST: 21 U/L (ref 15–41)
Albumin: 4.4 g/dL (ref 3.5–5.0)
Alkaline Phosphatase: 71 U/L (ref 38–126)
Anion gap: 11 (ref 5–15)
BUN: 16 mg/dL (ref 6–20)
CO2: 23 mmol/L (ref 22–32)
Calcium: 9.4 mg/dL (ref 8.9–10.3)
Chloride: 105 mmol/L (ref 98–111)
Creatinine, Ser: 0.78 mg/dL (ref 0.61–1.24)
GFR, Estimated: >60 mL/min (ref >60)
Glucose, Bld: 101 mg/dL — ABNORMAL HIGH (ref 70–99)
Potassium: 4 mmol/L (ref 3.5–5.1)
Sodium: 139 mmol/L (ref 135–145)
Total Bilirubin: 0.6 mg/dL (ref 0.3–1.2)
Total Protein: 7.5 g/dL (ref 6.5–8.1)

## 2022-10-08 LAB — I-STAT CG4 LACTIC ACID, ED: Lactic Acid, Venous: 0.7 mmol/L (ref 0.5–1.9)
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
## 2022-10-08 NOTE — ED Triage Notes (Signed)
Pt endorsing ingrown nail on R great toe; denies fevers, endorses purulent drainage, redness, swelling, painful to touch

## 2022-10-22 ENCOUNTER — Ambulatory Visit (HOSPITAL_COMMUNITY)
Admission: EM | Admit: 2022-10-22 | Discharge: 2022-10-22 | Disposition: A | Discharge status: Home / Self Care | Payer: Medicaid Other | Attending: Family Medicine | Admitting: Family Medicine

## 2022-10-22 ENCOUNTER — Encounter (HOSPITAL_COMMUNITY): Payer: Self-pay

## 2022-10-22 DIAGNOSIS — L600 Ingrowing nail: Secondary | ICD-10-CM | POA: Diagnosis not present

## 2022-10-22 MED ORDER — DOXYCYCLINE HYCLATE 100 MG PO CAPS: 100.0000 mg | ORAL_CAPSULE | Freq: Two times a day (BID) | ORAL | 0 refills | Status: DC

## 2022-10-22 NOTE — ED Triage Notes (Signed)
Pt c/o rt great toe with ingrown toe nail for over 4 months. States was tx'd by pcp months ago with antibiotics that didn't help. Redness and swelling noted.

## 2022-10-27 NOTE — ED Provider Notes (Signed)
[facility]   [id number] 10/22/22 Arrival Time: 1815  ASSESSMENT & PLAN:  1. Ingrown toenail   Many weeks. Will tx for infection. Rec podiatry f/u. Referral placed.  Discharge Medication List as of 10/22/2022  7:29 PM     START taking these medications   Details  doxycycline (VIBRAMYCIN) 100 MG capsule Take 1 capsule (100 mg total) by mouth 2 (two) times daily., Starting Thu 10/22/2022, Normal         Orders Placed This Encounter  Procedures   Ambulatory referral to Podiatry    Referral Priority:   Routine    Referral Type:   Consultation    Referral Reason:   Specialty Services Required    Requested Specialty:   Podiatry    Number of Visits Requested:   1    Reviewed expectations re: course of current medical issues. Questions answered. Outlined signs and symptoms indicating need for more acute intervention. Patient verbalized understanding. After Visit Summary given.  SUBJECTIVE: History from: patient. Jeff [name] is a 22 y.o. male who reports L great toe with ingrown toe nail for over 4 months. States was tx'd by pcp months ago with antibiotics that didn't help. Redness and swelling noted.    Past Surgical History:  Procedure Laterality Date   APPLICATION OF WOUND VAC Right 09/18/2021   Procedure: APPLICATION OF WOUND VAC;  Surgeon: Myrene Galas, MD;  [facility];  Service: Orthopedics;  Laterality: Right;   ARTERIAL LINE INSERTION N/A 08/05/2021   Procedure: ARTERIAL LINE INSERTION;  Surgeon: Dolores Patty, MD;  [facility];  Service: Cardiovascular;  Laterality: N/A;   ECMO CANNULATION N/A 08/05/2021   Procedure: ECMO CANNULATION;  Surgeon: Dolores Patty, MD;  [facility];  Service: Cardiovascular;  Laterality: N/A;   I & D EXTREMITY Right 08/07/2021   Procedure: WASHOUT OF RIGHT UPPER EXTREMITY AND RIGHT LOWER EXTREMITY;  Surgeon: Roby Lofts, MD;  Location: [facility] OR;  Service:  Orthopedics;  Laterality: Right;   I & D EXTREMITY Right 09/04/2021   Procedure: IRRIGATION AND DEBRIDEMENT EXTREMITY;  Surgeon: Myrene Galas, MD;  Location: [facility] OR;  Service: Orthopedics;  Laterality: Right;  I&D R thigh, possible application of myriad matrix and morcells vs STSG   I & D EXTREMITY N/A 09/11/2021   Procedure: IRRIGATION AND DEBRIDEMENT OF RIGHT THIGH AND VAC CHANGE WITH MYRIAD APPLICATION;  Surgeon: Myrene Galas, MD;  [facility];  Service: Orthopedics;  Laterality: N/A;   I & D EXTREMITY Right 09/18/2021   Procedure: IRRIGATION AND DEBRIDEMENT EXTREMITY;  Surgeon: Myrene Galas, MD;  Location: [facility] OR;  Service: Orthopedics;  Laterality: Right;   INSERTION OF TRACTION PIN Right 08/07/2021   Procedure: INSERTION OF TRACTION PIN RIGHT UPPER QUAD;  Surgeon: Roby Lofts, MD;  [facility];  Service: Orthopedics;  Laterality: Right;   IR FLUORO GUIDE CV LINE LEFT  08/19/2021   IR US GUIDE VASC ACCESS LEFT  08/19/2021   ORIF ACETABULAR FRACTURE Right 08/12/2021   Procedure: Debridment of Right Lateral Thigh, Biologic Graft Placement (40 x 20cm), Wound Vac Exchange, Insertion of traction;  Surgeon: Myrene Galas, MD;  [facility];  Service: Orthopedics;  Laterality: Right;   ORIF ELBOW FRACTURE Left 08/14/2021   Procedure: OPEN REDUCTION INTERNAL FIXATION (ORIF) ELBOW/OLECRANON FRACTURE;  Surgeon: Myrene Galas, MD;  [facility];  Service: Orthopedics;  Laterality: Left;   ORIF HUMERUS FRACTURE Left 08/14/2021   Procedure: OPEN REDUCTION INTERNAL FIXATION (  ORIF) DISTAL HUMERUS FRACTURE;  Surgeon: Myrene Galas, MD;  Location: [facility] OR;  Service: Orthopedics;  Laterality: Left;   SKIN SPLIT GRAFT Right 09/23/2021   Procedure: SKIN GRAFT SPLIT THICKNESS;  Surgeon: Myrene Galas, MD;  Location: [facility] OR;  Service: Orthopedics;  Laterality: Right;   TIBIA IM NAIL INSERTION Left 12/30/2016   Procedure: INTRAMEDULLARY (IM) NAIL TIBIAL;  Surgeon: Cammy Copa, MD;  Location: [facility]  OR;  Service: Orthopedics;  Laterality: Left;   TRACHEOSTOMY TUBE PLACEMENT N/A 08/12/2021   Procedure: TRACHEOSTOMY;  Surgeon: Violeta Gelinas, MD;  [facility];  Service: General;  Laterality: N/A;      OBJECTIVE:  Vitals:   10/22/22 1853  BP: 124/72  Pulse: 87  Resp: 18  Temp: 98.1 F (36.7 C)  SpO2: 97%    General appearance: alert; no distress HEENT: [place]; AT Neck: supple with FROM Resp: unlabored respirations Extremities: LLE: warm with well perfused appearance; swollen great toe distally with warm erythematous skin overlying suspected ingrown nail; mild TTP CV: brisk extremity capillary refill of BLE    Allergies  Allergen Reactions   Albumin Human Other (See Comments)    Contraindicated - pt is a Jehovah's Witness and refuses ALL blood products   Pollen Extract    Whole Blood     Patient is Jehovah's Witness    Past Medical History:  Diagnosis Date   Brachial plexus injury, right, 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   Social History   Socioeconomic History   Marital status: Single    Spouse name: Not on file   Number of children: Not on file   Years of education: Not on file   Highest education level: Not on file  Occupational History   Not on file  Tobacco Use   Smoking status: Former    Types: Cigarettes   Smokeless tobacco: Never  Vaping Use   Vaping status: Never Used  Substance and Sexual Activity   Alcohol use: No   Drug use: Never   Sexual activity: Not Currently  Other Topics Concern   Not on file  Social History Narrative   Lives with his parents    Social Determinants of Health   Financial Resource Strain: Low Risk  (05/19/2022)   Received from [facility], [facility]   Overall Financial Resource Strain (CARDIA)    Difficulty of Paying Living Expenses: Not very hard  Food Insecurity: No Food Insecurity (05/19/2022)   Received from [facility], [facility]   Hunger  Vital Sign    Worried About Running Out of Food in the Last Year: Never true    Ran Out of Food in the Last Year: Never true  Transportation Needs: No Transportation Needs (05/19/2022)   Received from [facility], [facility]   [facility] - Transportation    In the past 12 months, has lack of transportation kept you from medical appointments or from getting medications?: No    Lack of Transportation (Non-Medical): No  Physical Activity: Not on File (05/15/2021)   Received from [place], [place]   Physical Activity    Physical Activity: 0  Stress: Not on File (05/15/2021)   Received from [facility], [place]   Stress    Stress: 0  Social Connections: Unknown (06/06/2021)   Received from [facility], [facility]   Social Network    Social Network: Not on file   Family History  Problem Relation Age of Onset   Diabetes  Mother    Past Surgical History:  Procedure Laterality Date   APPLICATION OF WOUND VAC Right 09/18/2021   Procedure: APPLICATION OF WOUND VAC;  Surgeon: Myrene Galas, MD;  [facility];  Service: Orthopedics;  Laterality: Right;   ARTERIAL LINE INSERTION N/A 08/05/2021   Procedure: ARTERIAL LINE INSERTION;  Surgeon: Dolores Patty, MD;  [facility];  Service: Cardiovascular;  Laterality: N/A;   ECMO CANNULATION N/A 08/05/2021   Procedure: ECMO CANNULATION;  Surgeon: Dolores Patty, MD;  [facility];  Service: Cardiovascular;  Laterality: N/A;   I & D EXTREMITY Right 08/07/2021   Procedure: WASHOUT OF RIGHT UPPER EXTREMITY AND RIGHT LOWER EXTREMITY;  Surgeon: Roby Lofts, MD;  [facility];  Service: Orthopedics;  Laterality: Right;   I & D EXTREMITY Right 09/04/2021   Procedure: IRRIGATION AND DEBRIDEMENT EXTREMITY;  Surgeon: Myrene Galas, MD;  Location: [facility] OR;  Service: Orthopedics;  Laterality: Right;  I&D R thigh, possible application of myriad matrix and morcells vs STSG   I & D EXTREMITY N/A  09/11/2021   Procedure: IRRIGATION AND DEBRIDEMENT OF RIGHT THIGH AND VAC CHANGE WITH MYRIAD APPLICATION;  Surgeon: Myrene Galas, MD;  [facility];  Service: Orthopedics;  Laterality: N/A;   I & D EXTREMITY Right 09/18/2021   Procedure: IRRIGATION AND DEBRIDEMENT EXTREMITY;  Surgeon: Myrene Galas, MD;  Location: [facility] OR;  Service: Orthopedics;  Laterality: Right;   INSERTION OF TRACTION PIN Right 08/07/2021   Procedure: INSERTION OF TRACTION PIN RIGHT UPPER QUAD;  Surgeon: Roby Lofts, MD;  [facility];  Service: Orthopedics;  Laterality: Right;   IR FLUORO GUIDE CV LINE LEFT  08/19/2021   IR US GUIDE VASC ACCESS LEFT  08/19/2021   ORIF ACETABULAR FRACTURE Right 08/12/2021   Procedure: Debridment of Right Lateral Thigh, Biologic Graft Placement (40 x 20cm), Wound Vac Exchange, Insertion of traction;  Surgeon: Myrene Galas, MD;  [facility];  Service: Orthopedics;  Laterality: Right;   ORIF ELBOW FRACTURE Left 08/14/2021   Procedure: OPEN REDUCTION INTERNAL FIXATION (ORIF) ELBOW/OLECRANON FRACTURE;  Surgeon: Myrene Galas, MD;  [facility];  Service: Orthopedics;  Laterality: Left;   ORIF HUMERUS FRACTURE Left 08/14/2021   Procedure: OPEN REDUCTION INTERNAL FIXATION (ORIF) DISTAL HUMERUS FRACTURE;  Surgeon: Myrene Galas, MD;  [facility];  Service: Orthopedics;  Laterality: Left;   SKIN SPLIT GRAFT Right 09/23/2021   Procedure: SKIN GRAFT SPLIT THICKNESS;  Surgeon: Myrene Galas, MD;  Location: [facility] OR;  Service: Orthopedics;  Laterality: Right;   TIBIA IM NAIL INSERTION Left 12/30/2016   Procedure: INTRAMEDULLARY (IM) NAIL TIBIAL;  Surgeon: Cammy Copa, MD;  Location: [facility] OR;  Service: Orthopedics;  Laterality: Left;   TRACHEOSTOMY TUBE PLACEMENT N/A 08/12/2021   Procedure: TRACHEOSTOMY;  Surgeon: Violeta Gelinas, MD;  Location: [facility] OR;  Service: General;  Laterality: Vertis Kelch, MD 10/27/22 [phone]

## 2022-10-29 ENCOUNTER — Ambulatory Visit: Payer: Medicaid Other | Admitting: Podiatry

## 2022-11-16 ENCOUNTER — Encounter: Payer: Self-pay | Admitting: Podiatry

## 2022-11-16 ENCOUNTER — Ambulatory Visit (INDEPENDENT_AMBULATORY_CARE_PROVIDER_SITE_OTHER): Payer: Medicaid Other | Admitting: Podiatry

## 2022-11-16 VITALS — Ht 67.0 in | Wt 132.0 lb

## 2022-11-16 DIAGNOSIS — L600 Ingrowing nail: Secondary | ICD-10-CM

## 2022-11-16 MED ORDER — NEOMYCIN-POLYMYXIN-HC 3.5-10000-1 OT SUSP: OTIC | 0 refills | Status: DC

## 2022-11-16 NOTE — Patient Instructions (Addendum)
Soak Instructions    THE DAY AFTER THE PROCEDURE  Place 1/4 cup of epsom salts (or betadine, or white vinegar) in a quart of warm tap water.  Submerge your foot or feet with outer bandage intact for the initial soak; this will allow the bandage to become moist and wet for easy lift off.  Once you remove your bandage, continue to soak in the solution for 20 minutes.  This soak should be done twice a day.  Next, remove your foot or feet from solution, blot dry the affected area and cover.  You may use a band aid large enough to cover the area or use gauze and tape.  Apply other medications to the area as directed by the doctor such as polysporin neosporin.  IF YOUR SKIN BECOMES IRRITATED WHILE USING THESE INSTRUCTIONS, IT IS OKAY TO SWITCH TO  WHITE VINEGAR AND WATER. Or you may use antibacterial soap and water to keep the toe clean  Monitor for any signs/symptoms of infection. Call the office immediately if any occur or go directly to the emergency room. Call with any questions/concerns.    Long Term Care Instructions-Post Nail Surgery  You have had your ingrown toenail and root treated with a chemical.  This chemical causes a burn that will drain and ooze like a blister.  This can drain for 6-8 weeks or longer.  It is important to keep this area clean, covered, and follow the soaking instructions dispensed at the time of your surgery.  This area will eventually dry and form a scab.  Once the scab forms you no longer need to soak or apply a dressing.  If at any time you experience an increase in pain, redness, swelling, or drainage, you should contact the office as soon as possible.     Instrucciones de remojo  El dia despues del procedimiento:  Coloque 1/4 taza de sal de epsom en un litro de agua tibia del grifo. Sumerja su pie o pies con el vendaje externo intacto para el remojo inicial; esto permitira que el vendaje se humdezca y humedezca  para despegarlo facilmente. Una ves que retire su  vendaje, continue remojando la solucion durante 20 minutos. Este remojo deber hacerse dos veces al dia. Luego, retire su pie o pies de la solucion, seque el area afectada y cubra. Puede usar una curita lo suficientemente grande como para cubrir el area o usar una gasa y cinta adhesive. Aplique otros medicamentos en el area segun las indicaciones del medico, como la polisporina neosporina.    SI SU PIEL SE IRRITA AL USAR ESTAS INSTRUCCIONES, ES ACEPTABLE CAMBIARSE AL VINAGRE BLANCO Y AL AGUA. O puede usar agua y jabon antibacterial para mantener limpio el dedo del pie.   Monitoree cualquier signo/sintoma de infeccion. Llame a la oficina de inmediato si occure o vaya directament a la sal de emergencias. Llame con cualquier pregunta/inquitud.  Instrucciones de cuidado a largo plazco: cirugia post clavo; Le han tratado la una encarnada y la raiz con un quimico. Este quimico causa una quemadura que drenara y supurara como una ampolla. Esto puede drenar durante 6-8 semana o mas. Es importante mantener esta area limpia, cubierta y seguir las intrucciones de remojo distribuidas al momento de la cirugia. Esta area finalmente se secara y formara una costra. Una ves que se forma la costra, ya no necesita remojar o aplicar un aposito. Si en algun momento experimenta un aumento en el dolor, enrojecimiento, hinchazon o drenaje, debe comunicarse con la oficina lo   antes posible.   

## 2022-11-17 NOTE — Progress Notes (Signed)
Subjective:  Patient ID: Jeff [name], male    DOB: April 26, 2000,  MRN: [id number]  Chief Complaint  Patient presents with   Ingrown Toenail    Ingrown on right greater toe, pt states it is painful    22 y.o. male presents with the above complaint. History confirmed with patient.  He has had difficulty cutting the toenail and his sister has helped him due to a right hip injury.  Lateral border is currently affected but has had issues with medial side as well  Objective:  Physical Exam: warm, good capillary refill, no trophic changes or ulcerative lesions, normal DP and PT pulses, normal sensory exam, and ingrown nail right hallux lateral border. Assessment:   1. Ingrowing right great toenail      Plan:  Patient was evaluated and treated and all questions answered.    Ingrown Nail, right -Patient elects to proceed with minor surgery to remove ingrown toenail today. Consent reviewed and signed by patient. -Ingrown nail excised. See procedure note. -Educated on post-procedure care including soaking. Written instructions provided and reviewed. -Rx for Cortisporin sent to pharmacy. -Advised on signs and symptoms of infection developing.  We discussed that the phenol likely will create some redness and edema and tenderness around the nailbed as long as it is localized this is to be expected.  Will return as needed if any infection signs develop  Procedure: Excision of Ingrown Toenail Location: Right 1st toe  medial and lateral  nail borders. Anesthesia: Lidocaine 1% plain; 1.5 mL and Marcaine 0.5% plain; 1.5 mL, digital block. Skin Prep: Betadine. Dressing: Silvadene; telfa; dry, sterile, compression dressing. Technique: Following skin prep, the toe was exsanguinated and a tourniquet was secured at the base of the toe. The affected nail border was freed, split with a nail splitter, and excised. Chemical matrixectomy was then performed with phenol and irrigated out  with alcohol. The tourniquet was then removed and sterile dressing applied. Disposition: Patient tolerated procedure well.    No follow-ups on file.

## 2022-12-16 ENCOUNTER — Encounter: Payer: Self-pay | Admitting: Urology

## 2022-12-16 ENCOUNTER — Ambulatory Visit: Payer: Medicaid Other | Admitting: Urology

## 2022-12-16 VITALS — BP 108/72 | HR 91 | Ht 67.0 in | Wt 135.0 lb

## 2022-12-16 DIAGNOSIS — R310 Gross hematuria: Secondary | ICD-10-CM

## 2022-12-16 DIAGNOSIS — N201 Calculus of ureter: Secondary | ICD-10-CM | POA: Diagnosis not present

## 2022-12-16 DIAGNOSIS — R3129 Other microscopic hematuria: Secondary | ICD-10-CM

## 2022-12-16 LAB — MICROSCOPIC EXAMINATION

## 2022-12-16 LAB — URINALYSIS, COMPLETE
Bilirubin, UA: NEGATIVE
Glucose, UA: NEGATIVE
Ketones, UA: NEGATIVE
Leukocytes,UA: NEGATIVE
Nitrite, UA: NEGATIVE
Protein,UA: NEGATIVE
Specific Gravity, UA: 1.01 (ref 1.005–1.030)
Urobilinogen, Ur: 0.2 mg/dL (ref 0.2–1.0)
pH, UA: 6.5 (ref 5.0–7.5)

## 2022-12-16 NOTE — Patient Instructions (Signed)
[phone].    
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
## 2022-12-29 ENCOUNTER — Ambulatory Visit (HOSPITAL_COMMUNITY): Payer: Medicaid Other

## 2022-12-29 ENCOUNTER — Ambulatory Visit
Admission: RE | Admit: 2022-12-29 | Discharge: 2022-12-29 | Disposition: A | Discharge status: Home / Self Care | Payer: Medicaid Other | Source: Ambulatory Visit | Attending: Urology | Admitting: Urology

## 2022-12-29 DIAGNOSIS — R3129 Other microscopic hematuria: Secondary | ICD-10-CM | POA: Diagnosis present

## 2023-01-06 ENCOUNTER — Telehealth: Payer: Self-pay | Admitting: *Deleted

## 2023-01-06 NOTE — Telephone Encounter (Signed)
-----   Message from Verna [facility] sent at 01/06/2023 12:56 PM EST ----- Last checked MyChart 11/29/2022.  Please let him know the right ureteral calculus is no longer present.  He does have 2 small stones in the right kidney that can be monitored.  Recommend office visit in 6 months with a KUB

## 2023-01-07 ENCOUNTER — Other Ambulatory Visit: Payer: Self-pay

## 2023-01-07 DIAGNOSIS — N201 Calculus of ureter: Secondary | ICD-10-CM

## 2023-01-21 ENCOUNTER — Emergency Department (HOSPITAL_COMMUNITY): Payer: Medicaid Other

## 2023-01-21 ENCOUNTER — Other Ambulatory Visit: Payer: Self-pay

## 2023-01-21 ENCOUNTER — Emergency Department (HOSPITAL_COMMUNITY)
Admission: EM | Admit: 2023-01-21 | Discharge: 2023-01-22 | Disposition: A | Discharge status: Home / Self Care | Payer: Medicaid Other | Attending: Emergency Medicine | Admitting: Emergency Medicine

## 2023-01-21 DIAGNOSIS — Z7901 Long term (current) use of anticoagulants: Secondary | ICD-10-CM | POA: Diagnosis not present

## 2023-01-21 DIAGNOSIS — F312 Bipolar disorder, current episode manic severe with psychotic features: Secondary | ICD-10-CM | POA: Insufficient documentation

## 2023-01-21 DIAGNOSIS — Z87891 Personal history of nicotine dependence: Secondary | ICD-10-CM | POA: Insufficient documentation

## 2023-01-21 DIAGNOSIS — R002 Palpitations: Secondary | ICD-10-CM | POA: Insufficient documentation

## 2023-01-21 DIAGNOSIS — R456 Violent behavior: Secondary | ICD-10-CM | POA: Insufficient documentation

## 2023-01-21 DIAGNOSIS — Z1152 Encounter for screening for COVID-19: Secondary | ICD-10-CM | POA: Insufficient documentation

## 2023-01-21 DIAGNOSIS — R4182 Altered mental status, unspecified: Secondary | ICD-10-CM | POA: Insufficient documentation

## 2023-01-21 DIAGNOSIS — F29 Unspecified psychosis not due to a substance or known physiological condition: Secondary | ICD-10-CM

## 2023-01-21 DIAGNOSIS — R451 Restlessness and agitation: Secondary | ICD-10-CM

## 2023-01-21 LAB — CBC
HCT: 46.9 % (ref 39.0–52.0)
Hemoglobin: 15.3 g/dL (ref 13.0–17.0)
MCH: 29.4 pg (ref 26.0–34.0)
MCHC: 32.6 g/dL (ref 30.0–36.0)
MCV: 90.2 fL (ref 80.0–100.0)
Platelets: 227 10*3/uL (ref 150–400)
RBC: 5.2 MIL/uL (ref 4.22–5.81)
RDW: 12.2 % (ref 11.5–15.5)
WBC: 6 10*3/uL (ref 4.0–10.5)
nRBC: 0 % (ref 0.0–0.2)

## 2023-01-21 LAB — ACETAMINOPHEN LEVEL: Acetaminophen (Tylenol), Serum: <10 ug/mL — ABNORMAL LOW (ref 10–30)

## 2023-01-21 LAB — RAPID URINE DRUG SCREEN, HOSP PERFORMED
Amphetamines: NOT DETECTED
Barbiturates: NOT DETECTED
Benzodiazepines: NOT DETECTED
Cocaine: NOT DETECTED
Opiates: NOT DETECTED
Tetrahydrocannabinol: NOT DETECTED

## 2023-01-21 LAB — ETHANOL: Alcohol, Ethyl (B): <10 mg/dL (ref <10)

## 2023-01-21 LAB — COMPREHENSIVE METABOLIC PANEL WITH GFR
ALT: 21 U/L (ref 0–44)
AST: 16 U/L (ref 15–41)
Albumin: 4.2 g/dL (ref 3.5–5.0)
Alkaline Phosphatase: 57 U/L (ref 38–126)
Anion gap: 10 (ref 5–15)
BUN: 9 mg/dL (ref 6–20)
CO2: 24 mmol/L (ref 22–32)
Calcium: 9.4 mg/dL (ref 8.9–10.3)
Chloride: 107 mmol/L (ref 98–111)
Creatinine, Ser: 0.9 mg/dL (ref 0.61–1.24)
GFR, Estimated: >60 mL/min (ref >60)
Glucose, Bld: 91 mg/dL (ref 70–99)
Potassium: 4.1 mmol/L (ref 3.5–5.1)
Sodium: 141 mmol/L (ref 135–145)
Total Bilirubin: 0.6 mg/dL (ref <1.2)
Total Protein: 7 g/dL (ref 6.5–8.1)

## 2023-01-21 LAB — URINALYSIS, ROUTINE W REFLEX MICROSCOPIC
Bilirubin Urine: NEGATIVE
Glucose, UA: NEGATIVE mg/dL
Hgb urine dipstick: NEGATIVE
Ketones, ur: 20 mg/dL — AB
Leukocytes,Ua: NEGATIVE
Nitrite: NEGATIVE
Protein, ur: NEGATIVE mg/dL
Specific Gravity, Urine: 1.008 (ref 1.005–1.030)
pH: 7 (ref 5.0–8.0)

## 2023-01-21 LAB — CBG MONITORING, ED: Glucose-Capillary: 102 mg/dL — ABNORMAL HIGH (ref 70–99)

## 2023-01-21 LAB — SALICYLATE LEVEL: Salicylate Lvl: <7 mg/dL — ABNORMAL LOW (ref 7.0–30.0)

## 2023-01-21 MED ORDER — LORAZEPAM 1 MG PO TABS
1.0000 mg | ORAL_TABLET | ORAL | Status: DC | PRN
  Filled 2023-01-21: qty 1

## 2023-01-21 MED ORDER — ACETAMINOPHEN 325 MG PO TABS: 650.0000 mg | ORAL_TABLET | ORAL | Status: DC | PRN

## 2023-01-21 MED ORDER — ZIPRASIDONE MESYLATE 20 MG IM SOLR
20.0000 mg | INTRAMUSCULAR | Status: DC | PRN
  Filled 2023-01-21: qty 20

## 2023-01-21 MED ORDER — OLANZAPINE 5 MG PO TBDP
5.0000 mg | ORAL_TABLET | Freq: Three times a day (TID) | ORAL | Status: DC | PRN
  Administered 2023-01-21: 5 mg via ORAL
  Filled 2023-01-21: qty 1

## 2023-01-21 MED ORDER — STERILE WATER FOR INJECTION IJ SOLN
INTRAMUSCULAR | Status: AC
  Filled 2023-01-21: qty 10
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
## 2023-01-21 NOTE — ED Notes (Addendum)
Upon entering the room, this RN found patient to be soaked in urine. Patient urinated on himself so this RN changed sheets and provided patient with new scrub pants. Before scrub pants became available, patient removes his pants and underwear and states to this RN, "Be careful so I don't poke your eye out with my thing." Patient encouraged not to speak this way to this RN as it is inappropriate. Father remains at bedside.

## 2023-01-21 NOTE — ED Notes (Signed)
Patient transported to CT 

## 2023-01-21 NOTE — ED Notes (Signed)
Patient encouraged to urinate for this RN
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
## 2023-01-21 NOTE — ED Notes (Signed)
As this RN entered room to complete neuro check at 1752, patient found to be speaking loudly counting numbers in spanish. When this nurse attempted to ask patient orientation questions, patient states "I am not going to stop, I have to prove I can do it."
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
## 2023-01-21 NOTE — BH Assessment (Signed)
TTS Consult will be completed by IRIS. Coordinator will reach out in established secure chat with provider name and assessment time. Thanks
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
## 2023-01-21 NOTE — ED Notes (Signed)
Father at bedside with patient.

## 2023-01-21 NOTE — ED Notes (Signed)
Sitter at bedside.

## 2023-01-22 ENCOUNTER — Other Ambulatory Visit: Payer: Self-pay

## 2023-01-22 DIAGNOSIS — F3113 Bipolar disorder, current episode manic without psychotic features, severe: Secondary | ICD-10-CM

## 2023-01-22 LAB — SARS CORONAVIRUS 2 BY RT PCR: SARS Coronavirus 2 by RT PCR: NEGATIVE

## 2023-01-22 MED ORDER — OLANZAPINE 5 MG PO TBDP
5.0000 mg | ORAL_TABLET | Freq: Two times a day (BID) | ORAL | Status: DC
  Administered 2023-01-22: 5 mg via ORAL
  Filled 2023-01-22: qty 1

## 2023-01-22 NOTE — ED Provider Notes (Signed)
Emergency Medicine Observation Re-evaluation Note  Sahibjot Siemens is a 22 y.o. male, seen on rounds today.  Pt initially presented to the ED for complaints of Altered Mental Status, Palpitations, and Psychiatric Evaluation Currently, the patient is asleep in bed.  Physical Exam  BP 99/68 (BP Location: Right Arm)   Pulse (!) 57   Temp 97.8 F (36.6 C) (Oral)   Resp 16   Ht 5\' 7"  (1.702 m)   Wt 61.2 kg   SpO2 99%   BMI 21.13 kg/m  Physical Exam General: Asleep, no acute distress Cardiac: Regular rate Lungs: No increased WOB Psych: Calm, asleep  ED Course / MDM  EKG:   I have reviewed the labs performed to date as well as medications administered while in observation.  Recent changes in the last 24 hours include patient medically cleared. Evaluated by psych and recommended for inpatient psych.  Plan  Current plan is for inpatient psych, pending placement.    Rexford Maus, DO 01/22/23 [phone]

## 2023-01-22 NOTE — ED Notes (Signed)
Safe transport advised ETA of 15-20 minutes
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
## 2023-01-22 NOTE — Progress Notes (Signed)
Pt has been accepted to [facility]. Bed assignment: Will Give when Report is Called   Pt meets inpatient criteria per: [name]  Attending Physician will be: Dr. Jomarie Longs MD   Report can be called to: [phone] option 3  Pt can arrive anytime today.   Care Team Notified: [name], [facility] Paramedic   [place] Javone Ybanez LCSW-A   01/22/2023 12:27 PM

## 2023-01-22 NOTE — ED Notes (Addendum)
Spoke with Joey at [facility] who advised that patient would need family transport due to being voluntary. Father bedside agreed to this. They advised they would call back.

## 2023-01-22 NOTE — ED Notes (Addendum)
Mother called x 3 with no answer. Corporal Mebane called back and advised that he spoke with family, who advised that they are transporting him to [facility] as they did not like care he got here, and did not want him going to [facility] for treatment.
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
## 2023-01-22 NOTE — ED Notes (Signed)
Facesheet and vitals given to Diplomatic Services operational officer to fax to facility.

## 2023-01-22 NOTE — ED Notes (Signed)
[facility] will accept patient.

## 2023-01-22 NOTE — ED Notes (Addendum)
Safe transport called for transport, no eta given

## 2023-01-22 NOTE — ED Notes (Signed)
Received call from [facility]. Report given, they advised they would let us know with a decision.

## 2023-01-22 NOTE — ED Notes (Signed)
Facility made aware of negative covid, bed assignment 568
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
## 2023-01-22 NOTE — ED Notes (Signed)
Received call from Corporal Mebane with [employer]. He advised that prior to being informed of IVC, that patient was released to family. He advised they would try and get back in touch with them, and I advised I would also.
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
## 2023-01-22 NOTE — ED Notes (Signed)
TTS cart at bedside at bedside, video call in process with Dr. Gilman Schmidt. Interpreter at bedside.

## 2023-01-22 NOTE — ED Notes (Signed)
Receiving facility notified of delay in patient arrival.

## 2023-01-22 NOTE — ED Notes (Signed)
Patient was sent with Safe transport to go to [facility], but I received call from driver stating that patient was pulling on plastic in the car and she was turning around to bring him back. Patient was restful and cooperative at time I put him in car. Psych NP notified, and will discuss with EDP whether IVC is necessary to get him transported to facility.

## 2023-01-22 NOTE — Progress Notes (Signed)
LCSW Progress Note  [id number]   Jeff [name] [name]  01/22/2023  9:42 AM  Description:   Inpatient Psychiatric Referral  Patient was recommended inpatient per [name] There are no available beds at [name], per [name]. Patient was referred to the following out of network facilities:    Destination  Service Provider Address Phone Fax  [name] [address], [place] [address] [phone]  [name] [address], [place] [address] [phone]  [name] [address] [place], [place] [address] [phone]  [name] [address] [place] [address] [phone]  [name] [address], [place] [address] [phone]  [name] [address], [place] [address] [phone]  [name] [address] [place] [address] [phone]  [name] [address], [place] [address] [phone]  [name] [place] [phone]  [name] [address], [place] [address] [phone]  [name] [address], [place] [address] [phone]  [name] [address] [place] [address] [phone]  [name] EFAX [address] [place], [place] [phone]  [name] [address], [place] [address] [phone]  [name] [address], [place] [address] [phone]  [name] [address] [place] [address]  [phone]  [name] (after hours) [address] [place] [address] [phone]  [name] [address] [place] [address] [phone]  [name] [address], [place] [address] [phone]  [name] [address] [place] [address] [phone]  [name] [phone]      Situation ongoing, CSW to continue following and update chart as more information becomes available.      [place] Jimmie Dattilio, MSW, LCSW  01/22/2023 9:42 AM

## 2023-01-29 ENCOUNTER — Ambulatory Visit: Payer: Self-pay | Admitting: Nurse Practitioner

## 2023-02-19 ENCOUNTER — Ambulatory Visit: Payer: Self-pay | Admitting: Nurse Practitioner

## 2023-03-03 ENCOUNTER — Encounter: Payer: Self-pay | Admitting: Physical Therapy

## 2023-03-12 ENCOUNTER — Ambulatory Visit: Payer: Self-pay | Admitting: Nurse Practitioner

## 2023-03-24 ENCOUNTER — Encounter: Payer: Medicaid Other | Attending: Physical Medicine & Rehabilitation | Admitting: Physical Medicine & Rehabilitation

## 2023-03-24 ENCOUNTER — Encounter: Payer: Self-pay | Admitting: Physical Medicine & Rehabilitation

## 2023-03-24 VITALS — BP 104/70 | HR 83 | Ht 67.0 in | Wt 146.2 lb

## 2023-03-24 DIAGNOSIS — S069XAD Unspecified intracranial injury with loss of consciousness status unknown, subsequent encounter: Secondary | ICD-10-CM | POA: Diagnosis not present

## 2023-03-24 DIAGNOSIS — T07XXXA Unspecified multiple injuries, initial encounter: Secondary | ICD-10-CM | POA: Diagnosis present

## 2023-03-24 DIAGNOSIS — S069XAS Unspecified intracranial injury with loss of consciousness status unknown, sequela: Secondary | ICD-10-CM | POA: Insufficient documentation

## 2023-03-24 NOTE — Patient Instructions (Signed)
 ALWAYS FEEL FREE TO CALL OUR OFFICE WITH ANY PROBLEMS OR QUESTIONS [phone])  **PLEASE NOTE** ALL MEDICATION REFILL REQUESTS (INCLUDING CONTROLLED SUBSTANCES) NEED TO BE MADE AT LEAST 7 DAYS PRIOR TO REFILL BEING DUE. ANY REFILL REQUESTS INSIDE THAT TIME FRAME MAY RESULT IN DELAYS IN RECEIVING YOUR PRESCRIPTION.
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
## 2023-03-31 ENCOUNTER — Encounter: Payer: Self-pay | Admitting: Nurse Practitioner

## 2023-03-31 ENCOUNTER — Ambulatory Visit (INDEPENDENT_AMBULATORY_CARE_PROVIDER_SITE_OTHER): Payer: MEDICAID | Admitting: Nurse Practitioner

## 2023-03-31 VITALS — BP 89/56 | HR 83 | Temp 98.3°F | Wt 146.0 lb

## 2023-03-31 DIAGNOSIS — M1611 Unilateral primary osteoarthritis, right hip: Secondary | ICD-10-CM | POA: Diagnosis not present

## 2023-03-31 DIAGNOSIS — S72001G Fracture of unspecified part of neck of right femur, subsequent encounter for closed fracture with delayed healing: Secondary | ICD-10-CM

## 2023-03-31 DIAGNOSIS — Z23 Encounter for immunization: Secondary | ICD-10-CM | POA: Diagnosis not present

## 2023-03-31 DIAGNOSIS — R319 Hematuria, unspecified: Secondary | ICD-10-CM | POA: Diagnosis not present

## 2023-03-31 DIAGNOSIS — R350 Frequency of micturition: Secondary | ICD-10-CM

## 2023-03-31 DIAGNOSIS — I959 Hypotension, unspecified: Secondary | ICD-10-CM

## 2023-03-31 DIAGNOSIS — F29 Unspecified psychosis not due to a substance or known physiological condition: Secondary | ICD-10-CM

## 2023-03-31 HISTORY — DX: Unspecified psychosis not due to a substance or known physiological condition: F29

## 2023-03-31 NOTE — Patient Instructions (Signed)
 It is important that you exercise regularly at least 30 minutes 5 times a week as tolerated  Think about what you will eat, plan ahead. Choose " clean, green, fresh or frozen" over canned, processed or packaged foods which are more sugary, salty and fatty. 70 to 75% of food eaten should be vegetables and fruit. Three meals at set times with snacks allowed between meals, but they must be fruit or vegetables. Aim to eat over a 12 hour period , example 7 am to 7 pm, and STOP after  your last meal of the day. Drink water,generally about 64 ounces per day, no other drink is as healthy. Fruit juice is best enjoyed in a healthy way, by EATING the fruit.  Thanks for choosing [facility] we consider it a privelige to serve you.

## 2023-03-31 NOTE — Assessment & Plan Note (Signed)
 Now resolved.

## 2023-03-31 NOTE — Assessment & Plan Note (Signed)
 Has upcoming right hip replacement surgery Ambulating with a walker

## 2023-03-31 NOTE — Assessment & Plan Note (Signed)
 Patient educated on CDC recommendation for the vaccine. Verbal consent was obtained from the patient, vaccine administered by nurse, no sign of adverse reactions noted at this time. Patient education on arm soreness and use of tylenol or  for this patient  was discussed. Patient educated on the signs and symptoms of adverse effect and advise to contact the office if they occur. Vaccine information sheet given to patient.

## 2023-03-31 NOTE — Assessment & Plan Note (Addendum)
 Patient encouraged to stay well-hydrated and avoid drinking water late in the evening to help reduce having to get up multiple times at night to use the bathroom Recent A1c was normal kidney function was also normal Patient is established with urologist

## 2023-03-31 NOTE — Assessment & Plan Note (Signed)
 BP Readings from Last 3 Encounters:  03/31/23 (!) 89/56  03/24/23 104/70  01/22/23 113/67  Encouraged to drink at least 64 ounces of water daily to maintain hydration Patient is asymptomatic today

## 2023-03-31 NOTE — Assessment & Plan Note (Addendum)
 Continue Depakote 750 mg twice daily Zyprexa 10 mg at bedtime Encouraged to maintain close follow-up with psychiatrist

## 2023-03-31 NOTE — Progress Notes (Signed)
 Established Patient Office Visit  Subjective:  Patient ID: Jeff [name], male    DOB: October 13, 2000  Age: 23 y.o. MRN: [id number]  CC:  Chief Complaint  Patient presents with   Medical Management of Chronic Issues    HPI Jeff [name] is a 23 y.o. 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  Patient presents for follow-up for his chronic medical conditions  Psychosis.  Patient was on admission at  [facility] in December 2020 for acute psychosis with encephalopathy.  Stated that he is currently established with Psychiatrist at [facility].  Taking Depakote 750 mg BID daily, Zyprexa 5 mg twice daily.  Patient is alert and oriented x 4.  Remained calm throughout today's visit  Arthritis of right hip he is ambulating with a rolling walker without issue, has upcoming right hip surgery on April 08, 2023.  Urinary frequency patient complains of chronic urinary frequency worse at night, states that he drinks plenty of fluids daily and he has recently started drinking more fluids during the day to help getting up at night to urinate.  States that this has been helpful.  He denies fever, chills dysuria, hematuria, urinary hesitancy, abdominal pain.  Recent labs including A1c with normal  Flu vaccine given in the office today  Past Medical History:  Diagnosis Date   Brachial plexus injury, right, 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   Closed displaced segmental fracture of shaft of left humerus 08/21/2021   Closed fracture of multiple ribs with  flail chest 09/02/2021   Closed fracture of posterior wall of right acetabulum ([name]) 08/21/2021   DVT of upper extremity (deep vein thrombosis) ([name]) 09/02/2021   DVT, lower extremity, proximal, acute, right ([name]) 09/02/2021   Psychosis ([name]) 03/31/2023    Past Surgical History:  Procedure Laterality Date   APPLICATION OF WOUND VAC Right 09/18/2021   Procedure: APPLICATION OF WOUND VAC;  Surgeon: Myrene Galas, MD;  [facility];  Service: Orthopedics;  Laterality: Right;   ARTERIAL LINE INSERTION N/A 08/05/2021   Procedure: ARTERIAL LINE INSERTION;  Surgeon: Dolores Patty, MD;  [facility];  Service: Cardiovascular;  Laterality: N/A;   ECMO CANNULATION N/A 08/05/2021   Procedure: ECMO CANNULATION;  Surgeon: Dolores Patty, MD;  [facility];  Service: Cardiovascular;  Laterality: N/A;   I & D EXTREMITY Right 08/07/2021   Procedure: WASHOUT OF RIGHT UPPER EXTREMITY AND RIGHT LOWER EXTREMITY;  Surgeon: Roby Lofts, MD;  [facility];  Service: Orthopedics;  Laterality: Right;   I & D EXTREMITY Right 09/04/2021   Procedure: IRRIGATION AND DEBRIDEMENT EXTREMITY;  Surgeon: Myrene Galas, MD;  Location: [facility] OR;  Service: Orthopedics;  Laterality: Right;  I&D R thigh, possible application of myriad matrix and morcells vs STSG   I & D EXTREMITY N/A 09/11/2021   Procedure: IRRIGATION AND DEBRIDEMENT OF RIGHT THIGH AND VAC CHANGE WITH MYRIAD APPLICATION;  Surgeon: Myrene Galas, MD;  [facility];  Service: Orthopedics;  Laterality: N/A;   I & D EXTREMITY Right 09/18/2021   Procedure: IRRIGATION AND DEBRIDEMENT EXTREMITY;  Surgeon: Myrene Galas, MD;  [facility];  Service: Orthopedics;  Laterality: Right;   INSERTION OF TRACTION PIN Right 08/07/2021   Procedure: INSERTION OF TRACTION PIN RIGHT UPPER QUAD;  Surgeon: Roby Lofts, MD;  [facility];  Service: Orthopedics;  Laterality: Right;   IR FLUORO GUIDE CV LINE LEFT  08/19/2021   IR  US GUIDE VASC ACCESS LEFT  08/19/2021   ORIF ACETABULAR FRACTURE Right 08/12/2021   Procedure: Debridment of Right Lateral Thigh, Biologic Graft Placement (40 x 20cm), Wound Vac Exchange, Insertion of traction;  Surgeon: Myrene Galas, MD;  [facility];  Service: Orthopedics;  Laterality: Right;   ORIF ELBOW FRACTURE Left 08/14/2021   Procedure: OPEN REDUCTION INTERNAL FIXATION (ORIF) ELBOW/OLECRANON FRACTURE;  Surgeon: Myrene Galas, MD;  [facility];  Service: Orthopedics;  Laterality: Left;   ORIF HUMERUS FRACTURE Left 08/14/2021   Procedure: OPEN REDUCTION INTERNAL FIXATION (ORIF) DISTAL HUMERUS FRACTURE;  Surgeon: Myrene Galas, MD;  [facility];  Service: Orthopedics;  Laterality: Left;   SKIN SPLIT GRAFT Right 09/23/2021   Procedure: SKIN GRAFT SPLIT THICKNESS;  Surgeon: Myrene Galas, MD;  Location: [facility] OR;  Service: Orthopedics;  Laterality: Right;   TIBIA IM NAIL INSERTION Left 12/30/2016   Procedure: INTRAMEDULLARY (IM) NAIL TIBIAL;  Surgeon: Cammy Copa, MD;  Location: [facility] OR;  Service: Orthopedics;  Laterality: Left;   TRACHEOSTOMY TUBE PLACEMENT N/A 08/12/2021   Procedure: TRACHEOSTOMY;  Surgeon: Violeta Gelinas, MD;  Location: [facility] OR;  Service: General;  Laterality: N/A;    Family History  Problem Relation Age of Onset   Diabetes Mother     Social History   Socioeconomic History   Marital status: Single    Spouse name: Not on file   Number of children: Not on file   Years of education: Not on file   Highest education level: Not on file  Occupational History   Not on file  Tobacco Use   Smoking status: Former    Types: Cigarettes   Smokeless tobacco: Never  Vaping Use   Vaping status: Never Used  Substance and Sexual Activity   Alcohol use: No   Drug use: Never   Sexual activity: Not Currently  Other Topics Concern   Not on file  Social History Narrative   Lives with his parents    Social Drivers of Health   Financial Resource Strain: Low  Risk  (05/19/2022)   Received from [facility], [facility]   Overall Financial Resource Strain (CARDIA)    Difficulty of Paying Living Expenses: Not very hard  Food Insecurity: No Food Insecurity (05/19/2022)   Received from [facility], [facility]   Hunger Vital Sign    Worried About Running Out of Food in the Last Year: Never true    Ran Out of Food in the Last Year: Never true  Transportation Needs: No Transportation Needs (01/30/2023)   Received from [facility] - Transportation    In the past 12 months, has lack of transportation kept you from medical appointments or from getting medications?: No    Lack of Transportation (Non-Medical): No  Physical Activity: Not on File (05/15/2021)   Received from [place], [place]   Physical Activity    Physical Activity: 0  Stress: Not on File (05/15/2021)   Received from [place], [place]   Stress    Stress: 0  Social Connections: Unknown (06/06/2021)   Received from [facility], [facility]   Social Network  Social Network: Not on file  Intimate Partner Violence: Unknown (04/28/2021)   Received from [facility], [facility]   HITS    Physically Hurt: Not on file    Insult or Talk Down To: Not on file    Threaten Physical Harm: Not on file    Scream or Curse: Not on file    Outpatient Medications Prior to Visit  Medication Sig Dispense Refill   ascorbic acid (VITAMIN C) 500 MG tablet Take 1 tablet (500 mg total) by mouth daily. 60 tablet 4   divalproex (DEPAKOTE) 250 MG DR tablet Take 750 mg by mouth 2 (two) times daily. For 30 days     ferrous sulfate 325 (65 FE) MG tablet Take 325 mg by mouth daily with breakfast.     magnesium 30 MG tablet Take 30 mg by mouth 2 (two) times daily.     Multiple Vitamin (MULTIVITAMIN) tablet Take 1 tablet by mouth daily.     OLANZapine (ZYPREXA) 5 MG tablet Take 10 mg by mouth at bedtime. For 30 days      vitamin D3 (CHOLECALCIFEROL) 25 MCG tablet Take 1 tablet (1,000 Units total) by mouth daily. 30 tablet 4   No facility-administered medications prior to visit.    Allergies  Allergen Reactions   Albumin Human Other (See Comments)    Pt is a Jehovah's Witness and refuses ALL blood products   Whole Blood Other (See Comments)    Patient is Jehovah's Witness, refuses all blood products   Bee Pollen Itching   Pollen Extract Itching    ROS Review of Systems  Constitutional:  Negative for appetite change, chills, fatigue and fever.  HENT:  Negative for congestion, postnasal drip, rhinorrhea and sneezing.   Respiratory:  Negative for cough, shortness of breath and wheezing.   Cardiovascular:  Negative for chest pain, palpitations and leg swelling.  Gastrointestinal:  Negative for abdominal pain, constipation, nausea and vomiting.  Genitourinary:  Positive for frequency. Negative for difficulty urinating, dysuria and flank pain.  Musculoskeletal:  Negative for back pain, joint swelling and myalgias.  Skin:  Negative for color change, pallor, rash and wound.  Neurological:  Negative for dizziness, facial asymmetry, weakness, numbness and headaches.  Psychiatric/Behavioral:  Negative for behavioral problems, confusion, self-injury and suicidal ideas.       Objective:    Physical Exam Vitals and nursing note reviewed.  Constitutional:      General: He is not in acute distress.    Appearance: Normal appearance. He is not ill-appearing, toxic-appearing or diaphoretic.  HENT:     Mouth/Throat:     Mouth: Mucous membranes are moist.     Pharynx: Oropharynx is clear. No oropharyngeal exudate or posterior oropharyngeal erythema.  Eyes:     General: No scleral icterus.       Right eye: No discharge.        Left eye: No discharge.     Extraocular Movements: Extraocular movements intact.     Conjunctiva/sclera: Conjunctivae normal.  Cardiovascular:     Rate and Rhythm: Normal rate and  regular rhythm.     Pulses: Normal pulses.     Heart sounds: Normal heart sounds. No murmur heard.    No friction rub. No gallop.  Pulmonary:     Effort: Pulmonary effort is normal. No respiratory distress.     Breath sounds: Normal breath sounds. No stridor. No wheezing, rhonchi or rales.  Chest:     Chest wall: No tenderness.  Abdominal:  General: There is no distension.     Palpations: Abdomen is soft.     Tenderness: There is no abdominal tenderness. There is no right CVA tenderness, left CVA tenderness or guarding.  Musculoskeletal:        General: No swelling, deformity or signs of injury.     Right lower leg: No edema.     Left lower leg: No edema.     Comments: Patient ambulating without difficulty using a walker  Skin:    General: Skin is warm and dry.     Capillary Refill: Capillary refill takes less than 2 seconds.     Coloration: Skin is not jaundiced or pale.     Findings: No bruising, erythema or lesion.  Neurological:     Mental Status: He is alert and oriented to person, place, and time.     Gait: Gait abnormal.  Psychiatric:        Mood and Affect: Mood normal.        Behavior: Behavior normal.        Thought Content: Thought content normal.        Judgment: Judgment normal.     BP (!) 89/56   Pulse 83   Temp 98.3 F (36.8 C)   Wt 146 lb (66.2 kg)   SpO2 98%   BMI 22.87 kg/m  Wt Readings from Last 3 Encounters:  03/31/23 146 lb (66.2 kg)  03/24/23 146 lb 3.2 oz (66.3 kg)  01/22/23 134 lb 14.7 oz (61.2 kg)    No results found for: "TSH" Lab Results  Component Value Date   WBC 6.0 01/21/2023   HGB 15.3 01/21/2023   HCT 46.9 01/21/2023   MCV 90.2 01/21/2023   PLT 227 01/21/2023   Lab Results  Component Value Date   NA 141 01/21/2023   K 4.1 01/21/2023   CO2 24 01/21/2023   GLUCOSE 91 01/21/2023   BUN 9 01/21/2023   CREATININE 0.90 01/21/2023   BILITOT 0.6 01/21/2023   ALKPHOS 57 01/21/2023   AST 16 01/21/2023   ALT 21 01/21/2023    PROT 7.0 01/21/2023   ALBUMIN 4.2 01/21/2023   CALCIUM 9.4 01/21/2023   ANIONGAP 10 01/21/2023   No results found for: "CHOL" No results found for: "HDL" No results found for: "LDLCALC" Lab Results  Component Value Date   TRIG 412 (H) 08/16/2021   No results found for: "CHOLHDL" Lab Results  Component Value Date   HGBA1C 5.5 08/15/2021      Assessment & Plan:   Problem List Items Addressed This Visit       Cardiovascular and Mediastinum   Hypotension   BP Readings from Last 3 Encounters:  03/31/23 (!) 89/56  03/24/23 104/70  01/22/23 113/67  Encouraged to drink at least 64 ounces of water daily to maintain hydration Patient is asymptomatic today        Musculoskeletal and Integument   Arthritis of right hip   Has upcoming right hip replacement surgery Ambulating with a walker        Other   Hematuria   Now resolved      Need for influenza vaccination - Primary   Patient educated on CDC recommendation for the vaccine. Verbal consent was obtained from the patient, vaccine administered by nurse, no sign of adverse reactions noted at this time. Patient education on arm soreness and use of tylenol or for this patient  was discussed. Patient educated on the signs and symptoms of adverse effect and  advise to contact the office if they occur. Vaccine information sheet given to patient.        Relevant Orders   Flu vaccine trivalent PF, 6mos and older(Flulaval,Afluria,Fluarix,Fluzone) (Completed)   Urinary frequency   Patient encouraged to stay well-hydrated and avoid drinking water late in the evening to help reduce having to get up multiple times at night to use the bathroom Recent A1c was normal kidney function was also normal Patient is established with urologist      Psychosis ([name])   Continue Depakote 750 mg twice daily Zyprexa 10 mg at bedtime Encouraged to maintain close follow-up with psychiatrist      Other Visit Diagnoses       Closed fracture  dislocation of hip joint with delayed healing, right           No orders of the defined types were placed in this encounter.   Follow-up: Return in about 1 year (around 03/30/2024) for CPE.    Donell Beers, FNP

## 2023-04-05 ENCOUNTER — Encounter (HOSPITAL_COMMUNITY): Payer: Self-pay

## 2023-04-05 ENCOUNTER — Ambulatory Visit (HOSPITAL_COMMUNITY)
Admission: EM | Admit: 2023-04-05 | Discharge: 2023-04-05 | Disposition: A | Discharge status: Home / Self Care | Payer: MEDICAID

## 2023-04-05 DIAGNOSIS — H00014 Hordeolum externum left upper eyelid: Secondary | ICD-10-CM

## 2023-04-05 MED ORDER — ERYTHROMYCIN 5 MG/GM OP OINT: TOPICAL_OINTMENT | OPHTHALMIC | 0 refills | Status: DC
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
## 2023-04-05 NOTE — ED Triage Notes (Signed)
 Patient is here left eye pain and swelling x 1 week.

## 2023-05-06 ENCOUNTER — Telehealth: Payer: Self-pay

## 2023-05-06 NOTE — Transitions of Care (Post Inpatient/ED Visit) (Cosign Needed)
   05/06/2023  Name: Jeff [name] MRN: [id number] DOB: 2000-02-10  Today's TOC FU Call Status: Today's TOC FU Call Status:: Successful TOC FU Call Completed TOC FU Call Complete Date: 05/06/23 Patient's Name and Date of Birth confirmed.  Transition Care Management Follow-up Telephone Call Date of Discharge: 05/01/23 Discharge Facility: Other ([facility]) Name of Other ([facility]) Discharge Facility: [facility] Type of Discharge: Emergency Department How have you been since you were released from the hospital?: Better Any questions or concerns?: No  Items Reviewed: Did you receive and understand the discharge instructions provided?: Yes Medications obtained,verified, and reconciled?: Yes (Medications Reviewed) Any new allergies since your discharge?: No Dietary orders reviewed?: NA Do you have support at home?: Yes People in Home [RPT]: parent(s), sibling(s)  Medications Reviewed Today: Medications Reviewed Today   Medications were not reviewed in this encounter     Home Care and Equipment/Supplies: Were Home Health Services Ordered?: NA Any new equipment or medical supplies ordered?: NA  Functional Questionnaire: Do you need assistance with bathing/showering or dressing?: No Do you need assistance with meal preparation?: No Do you need assistance with eating?: No Do you have difficulty maintaining continence: No Do you need assistance with getting out of bed/getting out of a chair/moving?: No Do you have difficulty managing or taking your medications?: No  Follow up appointments reviewed: PCP Follow-up appointment confirmed?: NA Specialist Hospital Follow-up appointment confirmed?: Yes Date of Specialist follow-up appointment?: 05/07/23 Follow-Up Specialty Provider:: Ortho/ (has [facility] follow [facility] soon) Do you need transportation to your follow-up appointment?: No Do you understand care options if your condition(s) worsen?: Yes-patient verbalized  understanding  SDOH Interventions Today    Flowsheet Row Most Recent Value  SDOH Interventions   Food Insecurity Interventions Intervention Not Indicated  Housing Interventions Intervention Not Indicated  Transportation Interventions Intervention Not Indicated  Utilities Interventions Intervention Not Indicated       SIGNATURE.  Renelda Loma RMA

## 2023-06-01 ENCOUNTER — Other Ambulatory Visit: Payer: Self-pay | Admitting: Family Medicine

## 2023-06-01 DIAGNOSIS — M7989 Other specified soft tissue disorders: Secondary | ICD-10-CM

## 2023-06-02 ENCOUNTER — Ambulatory Visit
Admission: RE | Admit: 2023-06-02 | Discharge: 2023-06-02 | Disposition: A | Discharge status: Home / Self Care | Payer: MEDICAID | Source: Ambulatory Visit | Attending: Family Medicine | Admitting: Family Medicine

## 2023-06-02 DIAGNOSIS — M7989 Other specified soft tissue disorders: Secondary | ICD-10-CM

## 2023-06-03 ENCOUNTER — Encounter: Payer: Self-pay | Admitting: Family Medicine

## 2023-07-07 ENCOUNTER — Ambulatory Visit
Admission: RE | Admit: 2023-07-07 | Discharge: 2023-07-07 | Disposition: A | Discharge status: Home / Self Care | Payer: MEDICAID | Source: Ambulatory Visit | Attending: Urology | Admitting: Urology

## 2023-07-07 ENCOUNTER — Ambulatory Visit
Admission: RE | Admit: 2023-07-07 | Discharge: 2023-07-07 | Disposition: A | Discharge status: Home / Self Care | Payer: MEDICAID | Attending: Urology | Admitting: Urology

## 2023-07-07 ENCOUNTER — Ambulatory Visit (INDEPENDENT_AMBULATORY_CARE_PROVIDER_SITE_OTHER): Payer: MEDICAID | Admitting: Urology

## 2023-07-07 ENCOUNTER — Encounter: Payer: Self-pay | Admitting: Urology

## 2023-07-07 VITALS — BP 115/83 | HR 70 | Ht 67.0 in | Wt 130.0 lb

## 2023-07-07 DIAGNOSIS — N201 Calculus of ureter: Secondary | ICD-10-CM | POA: Insufficient documentation

## 2023-07-07 DIAGNOSIS — N200 Calculus of kidney: Secondary | ICD-10-CM | POA: Diagnosis not present

## 2023-07-07 NOTE — Progress Notes (Signed)
07/07/2023 9:35 AM   Jeff [name] Aug 27, 2000 [id number]  Referring provider: Paseda, Folashade R, FNP [address] [place],,  [place] [address]  Chief Complaint  Patient presents with   Nephrolithiasis    HPI: Jeff [name] is a 23 y.o. male who presents for 13-month follow-up.  Refer to my previous note 12/16/2018 for Follow-up CT showed absence of his ureteral calculus.  He did have 2 nonobstructing right renal calculi No complaints since his last visit.  Denies flank, abdominal pain.  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   Closed displaced segmental fracture of shaft of left humerus 08/21/2021   Closed fracture of multiple ribs with flail chest 09/02/2021   Closed fracture of posterior wall of right acetabulum ([name]) 08/21/2021   DVT of upper extremity (deep vein thrombosis) ([name]) 09/02/2021   DVT, lower extremity, proximal, acute, right ([name]) 09/02/2021   Psychosis ([name]) 03/31/2023    Surgical History: Past Surgical History:  Procedure Laterality Date   APPLICATION OF WOUND VAC Right 09/18/2021   Procedure: APPLICATION OF WOUND VAC;  Surgeon: Hardy Lia, MD;  Location: [facility] OR;  Service: Orthopedics;  Laterality: Right;   ARTERIAL LINE INSERTION N/A 08/05/2021   Procedure: ARTERIAL LINE INSERTION;  Surgeon: Mardell Shade, MD;  Location: [facility];  Service: Cardiovascular;  Laterality: N/A;   ECMO CANNULATION N/A 08/05/2021   Procedure: ECMO CANNULATION;  Surgeon: Mardell Shade, MD;  Location: [facility];  Service: Cardiovascular;  Laterality: N/A;   I & D EXTREMITY Right 08/07/2021   Procedure: WASHOUT OF RIGHT UPPER EXTREMITY AND RIGHT LOWER EXTREMITY;  Surgeon: Laneta Pintos, MD;  Location: [facility] OR;  Service: Orthopedics;  Laterality: Right;   I & D EXTREMITY Right 09/04/2021   Procedure: IRRIGATION AND  DEBRIDEMENT EXTREMITY;  Surgeon: Hardy Lia, MD;  Location: [facility] OR;  Service: Orthopedics;  Laterality: Right;  I&D R thigh, possible application of myriad matrix and morcells vs STSG   I & D EXTREMITY N/A 09/11/2021   Procedure: IRRIGATION AND DEBRIDEMENT OF RIGHT THIGH AND VAC CHANGE WITH MYRIAD APPLICATION;  Surgeon: Hardy Lia, MD;  Location: [facility] OR;  Service: Orthopedics;  Laterality: N/A;   I & D EXTREMITY Right 09/18/2021   Procedure: IRRIGATION AND DEBRIDEMENT EXTREMITY;  Surgeon: Hardy Lia, MD;  Location: [facility] OR;  Service: Orthopedics;  Laterality: Right;   INSERTION OF TRACTION PIN Right 08/07/2021   Procedure: INSERTION OF TRACTION PIN RIGHT UPPER QUAD;  Surgeon: Laneta Pintos, MD;  Location: [facility] OR;  Service: Orthopedics;  Laterality: Right;   IR FLUORO GUIDE CV LINE LEFT  08/19/2021   IR US  GUIDE VASC ACCESS LEFT  08/19/2021   ORIF ACETABULAR FRACTURE Right 08/12/2021   Procedure: Debridment of Right Lateral Thigh, Biologic Graft Placement (40 x 20cm), Wound Vac Exchange, Insertion of traction;  Surgeon: Hardy Lia, MD;  Location: [facility] OR;  Service: Orthopedics;  Laterality: Right;   ORIF ELBOW FRACTURE Left 08/14/2021   Procedure: OPEN REDUCTION INTERNAL FIXATION (ORIF) ELBOW/OLECRANON FRACTURE;  Surgeon: Hardy Lia, MD;  Location: [facility] OR;  Service: Orthopedics;  Laterality: Left;   ORIF HUMERUS FRACTURE Left 08/14/2021   Procedure: OPEN REDUCTION INTERNAL FIXATION (ORIF) DISTAL HUMERUS FRACTURE;  Surgeon: Hardy Lia, MD;  Location: [facility] OR;  Service: Orthopedics;  Laterality: Left;   SKIN SPLIT GRAFT Right 09/23/2021   Procedure: SKIN GRAFT SPLIT THICKNESS;  Surgeon: Hardy Lia, MD;  Location: [facility] OR;  Service: Orthopedics;  Laterality: Right;   TIBIA IM NAIL INSERTION Left 12/30/2016   Procedure: INTRAMEDULLARY (IM) NAIL TIBIAL;  Surgeon: Jasmine Mesi, MD;  Location: [facility] OR;  Service: Orthopedics;  Laterality: Left;   TRACHEOSTOMY TUBE PLACEMENT N/A 08/12/2021    Procedure: TRACHEOSTOMY;  Surgeon: Dorena Gander, MD;  Location: [facility] OR;  Service: General;  Laterality: N/A;    Home Medications:  Allergies as of 07/07/2023       Reactions   Albumin  Human Other (See Comments)   Pt is a Jehovah's Witness and refuses ALL blood products   Oxycodone  Other (See Comments), Palpitations   Blood pressure is elevated   Whole Blood Other (See Comments)   Patient is Jehovah's Witness, refuses all blood products   Bee Pollen Itching   Pollen Extract Itching        Medication List        Accurate as of July 07, 2023  9:35 AM. If you have any questions, ask your nurse or doctor.          STOP taking these medications    erythromycin  ophthalmic ointment       TAKE these medications    ascorbic acid  500 MG tablet Commonly known as: VITAMIN C  Take 1 tablet (500 mg total) by mouth daily.   divalproex 250 MG DR tablet Commonly known as: DEPAKOTE Take 500 mg by mouth.   ferrous sulfate  325 (65 FE) MG tablet Take 325 mg by mouth daily with breakfast.   magnesium  30 MG tablet Take 30 mg by mouth 2 (two) times daily.   multivitamin tablet Take 1 tablet by mouth daily.   vitamin D3 25 MCG tablet Commonly known as: CHOLECALCIFEROL  Take 1 tablet (1,000 Units total) by mouth daily.        Allergies:  Allergies  Allergen Reactions   Albumin  Human Other (See Comments)    Pt is a Jehovah's Witness and refuses ALL blood products   Oxycodone  Other (See Comments) and Palpitations    Blood pressure is elevated   Whole Blood Other (See Comments)    Patient is Jehovah's Witness, refuses all blood products   Bee Pollen Itching   Pollen Extract Itching    Family History: Family History  Problem Relation Age of Onset   Diabetes Mother     Social History:  reports that he has quit smoking. His smoking use included cigarettes. He has never used smokeless tobacco. He reports that he does not drink alcohol and does not use  drugs.   Physical Exam: BP 115/83   Pulse 70   Ht 5' 7 (1.702 m)   Wt 130 lb (59 kg)   BMI 20.36 kg/m   Constitutional:  Alert and oriented, No acute distress. HEENT:  AT Respiratory: Normal respiratory effort, no increased work of breathing. Psychiatric: Normal mood and affect.   Pertinent Imaging: KUB performed earlier this morning was personally reviewed and interpreted.  There is a mild amount of stool and bowel gas obscuring the right renal outline.  No calcifications are seen.   Assessment & Plan:    1. Right nephrolithiasis Calculi are not seen on KUB however there is a moderate amount of stool obscuring the renal outlines Repeat KUB 6 months and will call with results. RUS if calculi are not seen with next imaging   Geraline Knapp, MD  [facility] [address] [place], [place] [address] [phone]  [phone] 

## 2023-07-12 ENCOUNTER — Telehealth: Payer: Self-pay

## 2023-07-12 NOTE — Transitions of Care (Post Inpatient/ED Visit) (Signed)
   07/12/2023  Name: Jeff [name] MRN: [id number] DOB: 04-15-2000  Today's TOC FU Call Status: Today's TOC FU Call Status:: Unsuccessful Call (1st Attempt) Unsuccessful Call (1st Attempt) Date: 07/12/23  Attempted to reach the patient regarding the most recent Inpatient/ED visit.  Follow Up Plan: Additional outreach attempts will be made to reach the patient to complete the Transitions of Care (Post Inpatient/ED visit) call.   Signature  [employer], [place]

## 2023-07-13 ENCOUNTER — Telehealth: Payer: Self-pay

## 2023-07-13 NOTE — Transitions of Care (Post Inpatient/ED Visit) (Signed)
   07/13/2023  Name: Jeff [name] MRN: [id number] DOB: [birth year]  Today's TOC FU Call Status: Today's TOC FU Call Status:: Unsuccessful Call (2nd Attempt) Unsuccessful Call (2nd Attempt) Date: 07/13/23  Attempted to reach the patient regarding the most recent Inpatient/ED visit.  Follow Up Plan: Additional outreach attempts will be made to reach the patient to complete the Transitions of Care (Post Inpatient/ED visit) call.   Signature  [employer], [place]

## 2023-07-15 ENCOUNTER — Telehealth: Payer: Self-pay

## 2023-07-15 NOTE — Transitions of Care (Post Inpatient/ED Visit) (Signed)
   07/15/2023  Name: Jeff [name] MRN: [id number] DOB: 06/13/2000  Today's TOC FU Call Status:   Patient's Name and Date of Birth confirmed.  Transition Care Management Follow-up Telephone Call Type of Discharge: Emergency Department Reason for ED Visit: Other: How have you been since you were released from the hospital?: Better Any questions or concerns?: No  Items Reviewed: Did you receive and understand the discharge instructions provided?: Yes Medications obtained,verified, and reconciled?: Yes (Medications Reviewed) Any new allergies since your discharge?: No Dietary orders reviewed?: NA Do you have support at home?: Yes People in Home [RPT]: parent(s)  Medications Reviewed Today: Medications Reviewed Today     Reviewed by Angelita Bares, CMA (Certified Medical Assistant) on 07/15/23 at 1342  Med List Status: <None>   Medication Order Taking? Sig Documenting Provider Last Dose Status Informant  ascorbic acid  (VITAMIN C ) 500 MG tablet [id number] Yes Take 1 tablet (500 mg total) by mouth daily. Rawland Caddy, MD  Active Family Member, Mother  divalproex (DEPAKOTE) 250 MG DR tablet [id number] Yes Take 500 mg by mouth. [provider]  Active   ferrous sulfate  325 (65 FE) MG tablet [id number] Yes Take 325 mg by mouth daily with breakfast. [provider]  Active Family Member, Mother  magnesium  30 MG tablet [id number] Yes Take 30 mg by mouth 2 (two) times daily. [provider]  Active Family Member, Mother  Multiple Vitamin (MULTIVITAMIN) tablet [id number] Yes Take 1 tablet by mouth daily. [provider]  Active Family Member, Mother  vitamin D3 (CHOLECALCIFEROL ) 25 MCG tablet [id number] Yes Take 1 tablet (1,000 Units total) by mouth daily. Rawland Caddy, MD  Active Family Member, Mother            Home Care and Equipment/Supplies: Were Home Health Services Ordered?: NA Any new equipment or medical supplies  ordered?: NA  Functional Questionnaire: Do you need assistance with bathing/showering or dressing?: No Do you need assistance with meal preparation?: No Do you need assistance with eating?: No Do you have difficulty maintaining continence: No Do you need assistance with getting out of bed/getting out of a chair/moving?: No Do you have difficulty managing or taking your medications?: No  Follow up appointments reviewed: PCP Follow-up appointment confirmed?: NA (Patient declined follow up at time) Specialist Hospital Follow-up appointment confirmed?: NA Do you need transportation to your follow-up appointment?: No Do you understand care options if your condition(s) worsen?: Yes-patient verbalized understanding    SIGNATURE Carel Schnee, RMA

## 2023-07-29 ENCOUNTER — Ambulatory Visit: Payer: MEDICAID

## 2023-07-29 ENCOUNTER — Ambulatory Visit: Payer: MEDICAID | Admitting: Physical Therapy

## 2023-07-29 ENCOUNTER — Encounter: Payer: Self-pay | Admitting: Physical Therapy

## 2023-07-29 ENCOUNTER — Other Ambulatory Visit: Payer: Self-pay

## 2023-07-29 DIAGNOSIS — M6281 Muscle weakness (generalized): Secondary | ICD-10-CM | POA: Insufficient documentation

## 2023-07-29 DIAGNOSIS — R2689 Other abnormalities of gait and mobility: Secondary | ICD-10-CM | POA: Diagnosis present

## 2023-07-29 DIAGNOSIS — M25651 Stiffness of right hip, not elsewhere classified: Secondary | ICD-10-CM | POA: Diagnosis present

## 2023-07-29 DIAGNOSIS — R262 Difficulty in walking, not elsewhere classified: Secondary | ICD-10-CM | POA: Insufficient documentation
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
## 2023-08-05 ENCOUNTER — Ambulatory Visit: Payer: MEDICAID | Admitting: Physical Therapy

## 2023-08-05 ENCOUNTER — Encounter: Payer: Self-pay | Admitting: Physical Therapy

## 2023-08-05 DIAGNOSIS — M25651 Stiffness of right hip, not elsewhere classified: Secondary | ICD-10-CM

## 2023-08-05 DIAGNOSIS — M6281 Muscle weakness (generalized): Secondary | ICD-10-CM

## 2023-08-05 DIAGNOSIS — R2689 Other abnormalities of gait and mobility: Secondary | ICD-10-CM

## 2023-08-05 DIAGNOSIS — R262 Difficulty in walking, not elsewhere classified: Secondary | ICD-10-CM
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
## 2023-08-10 ENCOUNTER — Ambulatory Visit: Payer: MEDICAID | Admitting: Physical Therapy

## 2023-08-12 ENCOUNTER — Encounter: Payer: Self-pay | Admitting: Physical Therapy

## 2023-08-12 ENCOUNTER — Ambulatory Visit: Payer: MEDICAID | Admitting: Physical Therapy

## 2023-08-12 DIAGNOSIS — M6281 Muscle weakness (generalized): Secondary | ICD-10-CM

## 2023-08-12 DIAGNOSIS — R2689 Other abnormalities of gait and mobility: Secondary | ICD-10-CM

## 2023-08-12 DIAGNOSIS — M25651 Stiffness of right hip, not elsewhere classified: Secondary | ICD-10-CM

## 2023-08-12 NOTE — Patient Instructions (Signed)
 Aquatic Therapy at [facility]-  What to Expect!  Where:   [facility] [address] [place], [place] [address] Rehab phone [phone]  NOTE:  You will receive an automated phone message reminding you of your appt and it will say the appointment is at the [address] [facility] clinic.          How to Prepare: Please make sure you drink 8 ounces of water about one hour prior to your pool session A caregiver may attend if needed with the patient to help assist as needed. A caregiver can sit in the pool room on chair. Please arrive IN YOUR SUIT and 15 minutes prior to your appointment - this helps to avoid delays in starting your session. Please make sure to attend to any toileting needs prior to entering the pool [place] rooms for changing are provided.   There is direct access to the pool deck form the locker room.  You can lock your belongings in a locker with lock provided. Once on the pool deck your therapist will ask if you have signed the Patient  Consent and Assignment of Benefits form before beginning treatment Your therapist may take your blood pressure prior to, during and after your session if indicated We usually try and create a home exercise program based on activities we do in the pool.  Please be thinking about who might be able to assist you in the pool should you need to participate in an aquatic home exercise program at the time of discharge if you need assistance.  Some patients do not want to or do not have the ability to participate in an aquatic home program - this is not a barrier in any way to you participating in aquatic therapy as part of your current therapy plan! After Discharge from PT, you can continue using home program at  the [facility], there is a drop-in fee for $5 ($45 a month)or for 60 years  or older $4.00 ($40 a month for seniors ) or any local [facility] pool.  Memberships for purchase are  available for gym/pool at [facility]  IT IS VERY IMPORTANT THAT YOUR LAST VISIT BE IN THE CLINIC AT [facility] [address] AFTER YOUR LAST AQUATIC VISIT.  PLEASE MAKE SURE THAT YOU HAVE A [address]  APPOINTMENT SCHEDULED.   About the pool: Pool is located approximately 500 FT from the entrance of the building.  Please bring a support person if you need assistance traveling this      distance.   Your therapist will assist you in entering the water; there are two ways to           enter: stairs with railings, and a mechanical lift. Your therapist will determine the most appropriate way for you.  Water temperature is usually between 88-90 degrees  There may be up to 2 other swimmers in the pool at the same time  The pool deck is tile, please wear shoes with good traction if you prefer not to be barefoot.    Contact Info:  For appointment scheduling and cancellations:         Please call the [facility]  PH:[phone]              Aquatic Therapy  Outpatient Rehabilitation @ [facility]       All sessions are 45 minutes
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
## 2023-08-16 ENCOUNTER — Ambulatory Visit: Payer: MEDICAID | Admitting: Physical Therapy

## 2023-08-17 ENCOUNTER — Ambulatory Visit: Payer: MEDICAID | Admitting: Physical Therapy

## 2023-08-19 ENCOUNTER — Ambulatory Visit: Payer: MEDICAID | Admitting: Physical Therapy

## 2023-08-20 ENCOUNTER — Ambulatory Visit: Payer: MEDICAID

## 2023-08-20 DIAGNOSIS — R2689 Other abnormalities of gait and mobility: Secondary | ICD-10-CM | POA: Diagnosis not present

## 2023-08-20 DIAGNOSIS — M25651 Stiffness of right hip, not elsewhere classified: Secondary | ICD-10-CM

## 2023-08-20 DIAGNOSIS — M6281 Muscle weakness (generalized): Secondary | ICD-10-CM
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
## 2023-08-24 ENCOUNTER — Encounter: Payer: MEDICAID | Admitting: Physical Therapy

## 2023-08-25 ENCOUNTER — Ambulatory Visit: Payer: MEDICAID | Admitting: Physical Therapy

## 2023-08-25 DIAGNOSIS — R2689 Other abnormalities of gait and mobility: Secondary | ICD-10-CM | POA: Diagnosis not present

## 2023-08-25 DIAGNOSIS — M25651 Stiffness of right hip, not elsewhere classified: Secondary | ICD-10-CM

## 2023-08-25 DIAGNOSIS — R262 Difficulty in walking, not elsewhere classified: Secondary | ICD-10-CM

## 2023-08-25 DIAGNOSIS — M6281 Muscle weakness (generalized): Secondary | ICD-10-CM
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
## 2023-08-26 ENCOUNTER — Ambulatory Visit: Payer: Self-pay

## 2023-08-26 ENCOUNTER — Encounter: Payer: MEDICAID | Admitting: Physical Therapy

## 2023-08-26 NOTE — Telephone Encounter (Signed)
 FYI Only or Action Required?: FYI only for provider.  Patient was last seen in primary care on 03/31/2023 by Paseda, Folashade R, FNP.  Called Nurse Triage reporting No chief complaint on file..  Symptoms began several months ago.  Interventions attempted: Rest, hydration, or home remedies.  Symptoms are: unchanged.  Triage Disposition: No disposition on file.  Patient/caregiver understands and will follow disposition?:  Reason for Disposition  [1] Fatigue (i.e., tires easily, decreased energy) AND [2] persists > 1 week  Answer Assessment - Initial Assessment Questions Scheduled PCP appt, pt to go to [facility] today due to delay in scheduling. Provided with closest location and hours.  1. DESCRIPTION: Describe how you are feeling.     Fatigue  3. ONSET: When did these symptoms begin? (e.g., hours, days, weeks, months)     Several months  4. CAUSE: What do you think is causing the weakness or fatigue? (e.g., not drinking enough fluids, medical problem, trouble sleeping)     Unknown  6. OTHER SYMPTOMS: Do you have any other symptoms? (e.g., chest pain, fever, cough, SOB, vomiting, diarrhea, bleeding, other areas of pain)     Foamy urine  Protocols used: Weakness (Generalized) and Fatigue-A-AH Copied from CRM #[id number]. Topic: Clinical - Red Word Triage >> Aug 26, 2023  3:52 PM Marissa P wrote: Red Word that prompted transfer to Nurse Triage: Patient called in feeling very tired and foam in his urine when peeing. 2 weeks its been going on. Would like to be checked soon as possible.

## 2023-08-27 ENCOUNTER — Ambulatory Visit: Payer: MEDICAID

## 2023-08-27 DIAGNOSIS — M25651 Stiffness of right hip, not elsewhere classified: Secondary | ICD-10-CM | POA: Insufficient documentation

## 2023-08-27 DIAGNOSIS — M6281 Muscle weakness (generalized): Secondary | ICD-10-CM | POA: Insufficient documentation

## 2023-08-27 DIAGNOSIS — R262 Difficulty in walking, not elsewhere classified: Secondary | ICD-10-CM | POA: Insufficient documentation

## 2023-08-27 DIAGNOSIS — R2689 Other abnormalities of gait and mobility: Secondary | ICD-10-CM | POA: Insufficient documentation
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
## 2023-09-08 ENCOUNTER — Encounter: Payer: Self-pay | Admitting: Physical Therapy

## 2023-09-08 ENCOUNTER — Ambulatory Visit: Payer: MEDICAID | Admitting: Physical Therapy

## 2023-09-08 DIAGNOSIS — R2689 Other abnormalities of gait and mobility: Secondary | ICD-10-CM

## 2023-09-08 DIAGNOSIS — M25651 Stiffness of right hip, not elsewhere classified: Secondary | ICD-10-CM

## 2023-09-08 DIAGNOSIS — R262 Difficulty in walking, not elsewhere classified: Secondary | ICD-10-CM

## 2023-09-08 DIAGNOSIS — M6281 Muscle weakness (generalized): Secondary | ICD-10-CM

## 2023-09-08 NOTE — Therapy (Signed)
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 life. Baseline:5  Goal status: INITIAL --------------------------------------------------------------------------------------------- PLAN:  PT FREQUENCY: 2x/week  PT DURATION: 6 weeks  PLANNED INTERVENTIONS: 97110-Therapeutic exercises, 97530- Therapeutic activity, 97112- Neuromuscular re-education, 97535- Self Care, [id number]- Manual therapy, [phone]- Gait training, [phone]- Electrical stimulation (manual), Patient/Family education, Balance training, Stair training, Joint mobilization, Scar mobilization, Vestibular training,  and DME instructions  PLAN FOR NEXT SESSION: Continue with therapeutic focus on balance, RLE stability/strength, dynamic balance strategies, gait quality, and SLS quality on RLE.   For all possible CPT codes, reference the Planned Interventions line above.     Check all conditions that are expected to impact treatment: {Conditions expected to impact treatment:Contractures, spasticity or fracture relevant to requested treatment    Helene FORBES Gasmen PT  09/08/2023, 3:49 PM

## 2023-09-10 ENCOUNTER — Ambulatory Visit: Payer: MEDICAID

## 2023-09-14 ENCOUNTER — Ambulatory Visit: Payer: MEDICAID | Admitting: Physical Therapy

## 2023-09-14 ENCOUNTER — Encounter: Payer: Self-pay | Admitting: Physical Therapy

## 2023-09-14 DIAGNOSIS — R2689 Other abnormalities of gait and mobility: Secondary | ICD-10-CM

## 2023-09-14 DIAGNOSIS — M25651 Stiffness of right hip, not elsewhere classified: Secondary | ICD-10-CM

## 2023-09-14 DIAGNOSIS — M6281 Muscle weakness (generalized): Secondary | ICD-10-CM

## 2023-09-14 DIAGNOSIS — R262 Difficulty in walking, not elsewhere classified: Secondary | ICD-10-CM
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
## 2023-09-15 ENCOUNTER — Ambulatory Visit (INDEPENDENT_AMBULATORY_CARE_PROVIDER_SITE_OTHER): Payer: MEDICAID | Admitting: Nurse Practitioner

## 2023-09-15 ENCOUNTER — Encounter: Payer: Self-pay | Admitting: Nurse Practitioner

## 2023-09-15 VITALS — BP 106/57 | HR 78 | Temp 97.9°F | Wt 156.0 lb

## 2023-09-15 DIAGNOSIS — E559 Vitamin D deficiency, unspecified: Secondary | ICD-10-CM | POA: Insufficient documentation

## 2023-09-15 DIAGNOSIS — S069XAS Unspecified intracranial injury with loss of consciousness status unknown, sequela: Secondary | ICD-10-CM

## 2023-09-15 DIAGNOSIS — R4189 Other symptoms and signs involving cognitive functions and awareness: Secondary | ICD-10-CM

## 2023-09-15 DIAGNOSIS — R5383 Other fatigue: Secondary | ICD-10-CM | POA: Diagnosis not present

## 2023-09-15 DIAGNOSIS — R399 Unspecified symptoms and signs involving the genitourinary system: Secondary | ICD-10-CM | POA: Insufficient documentation

## 2023-09-15 DIAGNOSIS — R2689 Other abnormalities of gait and mobility: Secondary | ICD-10-CM | POA: Diagnosis not present

## 2023-09-15 DIAGNOSIS — F29 Unspecified psychosis not due to a substance or known physiological condition: Secondary | ICD-10-CM

## 2023-09-15 DIAGNOSIS — Z87820 Personal history of traumatic brain injury: Secondary | ICD-10-CM

## 2023-09-15 LAB — POCT URINE DIPSTICK
Bilirubin, UA: NEGATIVE
Glucose, UA: NEGATIVE mg/dL
Ketones, POC UA: NEGATIVE mg/dL
Leukocytes, UA: NEGATIVE
Nitrite, UA: NEGATIVE
POC PROTEIN,UA: NEGATIVE
Spec Grav, UA: 1.015 (ref 1.010–1.025)
Urobilinogen, UA: 0.2 U/dL
pH, UA: 6 (ref 5.0–8.0)

## 2023-09-15 NOTE — Progress Notes (Addendum)
 Acute Office Visit  Subjective:     Patient ID: Jeff [name], male    DOB: 2000/12/27, 23 y.o.   MRN: [id number]  Chief Complaint  Patient presents with   Fatigue     Discussed the use of AI scribe software for clinical note transcription with the patient, who gave verbal consent to proceed.  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 who presents with fatigue and foamy urine. He is accompanied by his mother and sister, they assisted with providing history  He has been experiencing fatigue and foamy urine for the past few months. His mother describes him as extremely tired all the time and notes swelling in his hands. No fever, chills, or hematuria. No abdominal pain.  He has a history of elevated liver enzymes noted a few months ago. His mother mentions frequent urination in large amounts, although he does not always realize it due to memory issues. He lives with his family, who have observed these symptoms.  He underwent hip surgery four months ago and has  started physical therapy but its on hold currently , just got new shoes which they want him to get accustomed to before restarting PT . He uses crutches for mobility.  He is currently taking vitamin D  2000 units daily, ferrous sulfate  325 mg daily, magnesium  30 mg twice daily, and olanzapine  5 mg daily for psychosis which was recently reduced from 7.5 mg. He is not taking Depakote. He reports eating and sleeping  well.    Review of Systems  Constitutional:  Positive for fatigue. Negative for appetite change, chills and fever.  HENT:  Negative for congestion, postnasal drip, rhinorrhea and sneezing.   Respiratory:  Negative for cough, shortness of breath and wheezing.   Cardiovascular:  Negative for chest pain, palpitations and leg swelling.  Gastrointestinal:  Negative for abdominal pain, constipation, nausea and vomiting.  Genitourinary:  Negative for difficulty urinating, dysuria, flank pain and frequency.  Musculoskeletal:  Negative for arthralgias, back pain, joint swelling and myalgias.  Skin:  Negative for color change, pallor and rash.  Neurological:  Negative for facial asymmetry, weakness, numbness and headaches.  Psychiatric/Behavioral:  Negative for behavioral problems, confusion, self-injury and suicidal ideas.         Objective:    BP (!) 106/57   Pulse 78   Temp 97.9 F (36.6 C)   Wt 156 lb (70.8 kg)   SpO2 97%   BMI 24.43 kg/m    Physical Exam Vitals and nursing note reviewed.  Constitutional:      General: He is not in acute distress.    Appearance: Normal appearance. He is not ill-appearing, toxic-appearing or diaphoretic.  Eyes:     General: No scleral icterus.       Right eye: No discharge.        Left eye: No discharge.     Extraocular Movements: Extraocular movements intact.     Conjunctiva/sclera: Conjunctivae normal.  Cardiovascular:     Rate and Rhythm: Normal rate and regular rhythm.     Pulses: Normal pulses.  Heart sounds: Normal heart sounds. No murmur heard.    No friction rub. No gallop.  Pulmonary:     Effort: Pulmonary effort is normal. No respiratory distress.     Breath sounds: Normal breath sounds. No stridor. No wheezing, rhonchi or rales.  Chest:     Chest wall: No tenderness.  Abdominal:     General: There is no distension.     Palpations: Abdomen is soft.     Tenderness: There is no abdominal tenderness. There is no right CVA  tenderness, left CVA tenderness or guarding.  Musculoskeletal:        General: No tenderness or signs of injury.     Right lower leg: No edema.     Left lower leg: No edema.     Comments: No edema noted   Skin:    General: Skin is warm and dry.     Capillary Refill: Capillary refill takes less than 2 seconds.     Coloration: Skin is not jaundiced or pale.     Findings: No bruising, erythema or lesion.  Neurological:     Mental Status: He is alert. Mental status is at baseline.     Motor: No weakness.     Coordination: Coordination normal.     Gait: Gait abnormal.  Psychiatric:        Mood and Affect: Mood normal.        Behavior: Behavior normal.        Thought Content: Thought content normal.        Judgment: Judgment normal.     Results for orders placed or performed in visit on 09/15/23  POCT URINE DIPSTICK  Result Value Ref Range   Color, UA yellow yellow   Clarity, UA clear clear   Glucose, UA negative negative mg/dL   Bilirubin, UA negative negative   Ketones, POC UA negative negative mg/dL   Spec Grav, UA 8.984 8.989 - 1.025   Blood, UA small (A) negative   pH, UA 6.0 5.0 - 8.0   POC PROTEIN,UA negative negative, trace   Urobilinogen, UA 0.2 0.2 or 1.0 E.U./dL   Nitrite, UA Negative Negative   Leukocytes, UA Negative Negative        Assessment & Plan:  Assessment and Plan Assessment & Plan  .     Problem List Items Addressed This Visit       Other   Cognitive deficit as late effect of traumatic brain injury ([name])   Advised to maintain close follow-up with neurology      Psychosis ([name])   Continue Olanzapine  5mg  daily  Maintain close follow up with psychiatrist       Other abnormalities of gait and mobility   History of hip surgery (4 months ago) Post-operative status 4 months after hip surgery. Physical therapy delayed due to shoe lift requirement per patients family . Using crutches for ambulation. - Monitor adaptation to shoe  lift. Maintain close follow-up with PT OT      Vitamin D  deficiency   Last vitamin D  Lab Results  Component Value Date   VD25OH 25.12 (L) 09/05/2021   Currently on 2000 IU of vitamin D  daily. - Order vitamin D  level to assess current status. - Continue vitamin D  supplementation at 2000 IU daily      Relevant Orders   VITAMIN D  25 Hydroxy (Vit-D Deficiency, Fractures)   Fatigue - Primary    - CBC - VITAMIN D  25 Hydroxy (Vit-D Deficiency, Fractures) - TSH -  CMP14+EGFR  - VITAMIN D  25 Hydroxy (Vit-D Deficiency, Fractures)        Relevant Orders   CBC   VITAMIN D  25 Hydroxy (Vit-D Deficiency, Fractures)   TSH   CMP14+EGFR   Urinary symptom or sign   Small blood noted, has history of kidney stones, followed by urology  UA negative for protein Encouraged to maintain hydration        Relevant Orders   POCT URINE DIPSTICK (Completed)    No orders of the defined types were placed in this encounter.   Return in about 6 months (around 03/17/2024) for CPE.  Martavious Hartel R Chanay Nugent, FNP

## 2023-09-15 NOTE — Assessment & Plan Note (Signed)
 Advised to maintain close follow-up with neurology

## 2023-09-15 NOTE — Assessment & Plan Note (Addendum)
 Small blood noted, has history of kidney stones, followed by urology  UA negative for protein Encouraged to maintain hydration

## 2023-09-15 NOTE — Assessment & Plan Note (Signed)
-   CBC - VITAMIN D  25 Hydroxy (Vit-D Deficiency, Fractures) - TSH - CMP14+EGFR  - VITAMIN D  25 Hydroxy (Vit-D Deficiency, Fractures)

## 2023-09-15 NOTE — Assessment & Plan Note (Signed)
 Last vitamin D  Lab Results  Component Value Date   VD25OH 25.12 (L) 09/05/2021   Currently on 2000 IU of vitamin D  daily. - Order vitamin D  level to assess current status. - Continue vitamin D  supplementation at 2000 IU daily

## 2023-09-15 NOTE — Assessment & Plan Note (Signed)
 History of hip surgery (4 months ago) Post-operative status 4 months after hip surgery. Physical therapy delayed due to shoe lift requirement per patients family . Using crutches for ambulation. - Monitor adaptation to shoe lift. Maintain close follow-up with PT OT

## 2023-09-15 NOTE — Addendum Note (Signed)
 Addended by: VICTORY IHA on: 09/15/2023 04:44 PM   Modules accepted: Orders

## 2023-09-15 NOTE — Assessment & Plan Note (Deleted)
 Maintain close follow-up with neurology Patient alert and oriented

## 2023-09-15 NOTE — Patient Instructions (Signed)

## 2023-09-15 NOTE — Assessment & Plan Note (Signed)
 Continue Olanzapine  5mg  daily  Maintain close follow up with psychiatrist

## 2023-09-16 ENCOUNTER — Ambulatory Visit: Payer: MEDICAID

## 2023-09-16 NOTE — Telephone Encounter (Unsigned)
 Copied from CRM #[id number]. Topic: Clinical - Lab/Test Results >> Sep 15, 2023  2:44 PM Nathanel BROCKS wrote: Reason for CRM: pt came today but was unable to get them done. He was sent to [facility] but he was unable to go. Please call pt and schedule appt for when he can have them done in office, please.

## 2023-09-17 ENCOUNTER — Ambulatory Visit: Payer: Self-pay | Admitting: Nurse Practitioner

## 2023-09-17 LAB — CMP14+EGFR
ALT: 29 IU/L (ref 0–44)
AST: 20 IU/L (ref 0–40)
Albumin: 4.9 g/dL (ref 4.3–5.2)
Alkaline Phosphatase: 98 IU/L (ref 44–121)
BUN/Creatinine Ratio: 17 (ref 9–20)
BUN: 15 mg/dL (ref 6–20)
Bilirubin Total: 0.5 mg/dL (ref 0.0–1.2)
CO2: 22 mmol/L (ref 20–29)
Calcium: 9.7 mg/dL (ref 8.7–10.2)
Chloride: 103 mmol/L (ref 96–106)
Creatinine, Ser: 0.87 mg/dL (ref 0.76–1.27)
Globulin, Total: 2.5 g/dL (ref 1.5–4.5)
Glucose: 82 mg/dL (ref 70–99)
Potassium: 4.6 mmol/L (ref 3.5–5.2)
Sodium: 140 mmol/L (ref 134–144)
Total Protein: 7.4 g/dL (ref 6.0–8.5)
eGFR: 125 mL/min/1.73 (ref >59)

## 2023-09-17 LAB — CBC
Hematocrit: 47.6 % (ref 37.5–51.0)
Hemoglobin: 15.4 g/dL (ref 13.0–17.7)
MCH: 29 pg (ref 26.6–33.0)
MCHC: 32.4 g/dL (ref 31.5–35.7)
MCV: 90 fL (ref 79–97)
Platelets: 251 x10E3/uL (ref 150–450)
RBC: 5.31 x10E6/uL (ref 4.14–5.80)
RDW: 13.2 % (ref 11.6–15.4)
WBC: 4.9 x10E3/uL (ref 3.4–10.8)

## 2023-09-17 LAB — TSH: TSH: 1.76 u[IU]/mL (ref 0.450–4.500)

## 2023-09-17 LAB — VITAMIN D 25 HYDROXY (VIT D DEFICIENCY, FRACTURES): Vit D, 25-Hydroxy: 33.4 ng/mL (ref 30.0–100.0)

## 2023-12-27 ENCOUNTER — Other Ambulatory Visit (HOSPITAL_COMMUNITY): Payer: Self-pay

## 2024-01-11 ENCOUNTER — Other Ambulatory Visit: Payer: Self-pay

## 2024-01-11 ENCOUNTER — Ambulatory Visit (INDEPENDENT_AMBULATORY_CARE_PROVIDER_SITE_OTHER): Payer: MEDICAID

## 2024-01-11 ENCOUNTER — Ambulatory Visit (HOSPITAL_COMMUNITY)
Admission: EM | Admit: 2024-01-11 | Discharge: 2024-01-11 | Disposition: A | Discharge status: Home / Self Care | Payer: MEDICAID | Attending: Student | Admitting: Student

## 2024-01-11 ENCOUNTER — Encounter (HOSPITAL_COMMUNITY): Payer: Self-pay | Admitting: *Deleted

## 2024-01-11 DIAGNOSIS — R059 Cough, unspecified: Secondary | ICD-10-CM

## 2024-01-11 DIAGNOSIS — J40 Bronchitis, not specified as acute or chronic: Secondary | ICD-10-CM

## 2024-01-11 MED ORDER — PROMETHAZINE-DM 6.25-15 MG/5ML PO SYRP: 5.0000 mL | ORAL_SOLUTION | Freq: Four times a day (QID) | ORAL | 0 refills | Status: AC | PRN

## 2024-01-11 MED ORDER — ALBUTEROL SULFATE HFA 108 (90 BASE) MCG/ACT IN AERS
1.0000 | INHALATION_SPRAY | Freq: Once | RESPIRATORY_TRACT | Status: AC
  Administered 2024-01-11: 10:00:00 2 via RESPIRATORY_TRACT

## 2024-01-11 MED ORDER — PREDNISONE 10 MG (21) PO TBPK: ORAL_TABLET | Freq: Every day | ORAL | 0 refills | Status: AC
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
## 2024-01-11 NOTE — ED Triage Notes (Signed)
 PT reports for one week he has had a HA,runny nose and cough. No change in Sx's.

## 2024-01-11 NOTE — Discharge Instructions (Addendum)
-   You do not have a bacterial infection, or pneumonia.  You do not need antibiotics. - However, you do have bronchitis, which is an inflammation of the lungs due to a virus.  We manage this with medications to decrease the inflammation. -Albuterol  inhaler  that we gave you today as needed for cough, wheezing, shortness of breath, 1 to 2 puffs every 6 hours as needed. -Prednisone  taper for cough/bronchitis. I recommend taking this in the morning as it could give you energy.  Avoid NSAIDs like ibuprofen and alleve while taking this medication as they can increase your risk of stomach upset and even GI bleeding when in combination with a steroid. You can continue tylenol  (acetaminophen ) up to 1000mg  3x daily. -Promethazine  DM cough syrup for congestion/cough. This could make you drowsy, so take at night before bed. -Your cough should slowly get better instead of worse. If you develop a cough productive of dark or red sputum, new shortness of breath, new chest tightness, new fevers, etc - seek additional care.

## 2024-03-20 ENCOUNTER — Encounter: Payer: Self-pay | Admitting: Nurse Practitioner

## 2024-03-31 ENCOUNTER — Ambulatory Visit: Payer: Self-pay | Admitting: Nurse Practitioner
